# Patient Record
Sex: Female | Born: 1945
Health system: Southern US, Community
[De-identification: ages and names within clinical notes are randomized; demographics above are authoritative.]

## PROBLEM LIST (undated history)

## (undated) ENCOUNTER — Emergency Department (HOSPITAL_BASED_OUTPATIENT_CLINIC_OR_DEPARTMENT_OTHER): Admission: EM | Payer: BLUE CROSS/BLUE SHIELD | Source: Home / Self Care

## (undated) DIAGNOSIS — M810 Age-related osteoporosis without current pathological fracture: Secondary | ICD-10-CM

## (undated) DIAGNOSIS — K754 Autoimmune hepatitis: Secondary | ICD-10-CM

## (undated) DIAGNOSIS — K219 Gastro-esophageal reflux disease without esophagitis: Secondary | ICD-10-CM

## (undated) DIAGNOSIS — R002 Palpitations: Secondary | ICD-10-CM

## (undated) DIAGNOSIS — K589 Irritable bowel syndrome without diarrhea: Secondary | ICD-10-CM

## (undated) DIAGNOSIS — C801 Malignant (primary) neoplasm, unspecified: Secondary | ICD-10-CM

## (undated) DIAGNOSIS — M069 Rheumatoid arthritis, unspecified: Secondary | ICD-10-CM

## (undated) DIAGNOSIS — R5382 Chronic fatigue, unspecified: Secondary | ICD-10-CM

## (undated) DIAGNOSIS — M351 Other overlap syndromes: Secondary | ICD-10-CM

## (undated) DIAGNOSIS — Z87442 Personal history of urinary calculi: Secondary | ICD-10-CM

## (undated) DIAGNOSIS — E349 Endocrine disorder, unspecified: Secondary | ICD-10-CM

## (undated) DIAGNOSIS — M329 Systemic lupus erythematosus, unspecified: Secondary | ICD-10-CM

## (undated) DIAGNOSIS — IMO0002 Reserved for concepts with insufficient information to code with codable children: Secondary | ICD-10-CM

## (undated) DIAGNOSIS — E039 Hypothyroidism, unspecified: Secondary | ICD-10-CM

## (undated) DIAGNOSIS — T8859XA Other complications of anesthesia, initial encounter: Secondary | ICD-10-CM

## (undated) DIAGNOSIS — T4145XA Adverse effect of unspecified anesthetic, initial encounter: Secondary | ICD-10-CM

## (undated) HISTORY — PX: LAPAROSCOPIC CHOLECYSTECTOMY: SUR755

## (undated) HISTORY — DX: Endocrine disorder, unspecified: E34.9

## (undated) HISTORY — PX: TUBAL LIGATION: SHX77

## (undated) HISTORY — PX: ABDOMINAL SURGERY: SHX537

## (undated) HISTORY — DX: Rheumatoid arthritis, unspecified: M06.9

## (undated) HISTORY — PX: TONSILLECTOMY AND ADENOIDECTOMY: SHX28

## (undated) HISTORY — PX: ABDOMINAL HYSTERECTOMY: SHX81

## (undated) HISTORY — DX: Irritable bowel syndrome, unspecified: K58.9

## (undated) HISTORY — DX: Malignant (primary) neoplasm, unspecified: C80.1

## (undated) HISTORY — DX: Chronic fatigue, unspecified: R53.82

## (undated) HISTORY — DX: Age-related osteoporosis without current pathological fracture: M81.0

## (undated) HISTORY — DX: Reserved for concepts with insufficient information to code with codable children: IMO0002

## (undated) HISTORY — PX: BLADDER SUSPENSION: SHX72

## (undated) HISTORY — DX: Systemic lupus erythematosus, unspecified: M32.9

## (undated) HISTORY — PX: EYE SURGERY: SHX253

## (undated) HISTORY — PX: TONSILLECTOMY: SUR1361

## (undated) HISTORY — DX: Hypothyroidism, unspecified: E03.9

## (undated) HISTORY — DX: Palpitations: R00.2

---

## 1978-05-26 HISTORY — PX: BREAST SURGERY: SHX581

## 1998-02-21 ENCOUNTER — Emergency Department (HOSPITAL_COMMUNITY): Admission: EM | Admit: 1998-02-21 | Discharge: 1998-02-21 | Payer: Self-pay | Admitting: Emergency Medicine

## 1998-06-15 ENCOUNTER — Ambulatory Visit: Admission: RE | Admit: 1998-06-15 | Discharge: 1998-06-15 | Payer: Self-pay | Admitting: Internal Medicine

## 1998-07-03 ENCOUNTER — Other Ambulatory Visit: Admission: RE | Admit: 1998-07-03 | Discharge: 1998-07-03 | Payer: Self-pay | Admitting: Obstetrics and Gynecology

## 1999-07-22 ENCOUNTER — Encounter: Payer: Self-pay | Admitting: Obstetrics and Gynecology

## 1999-07-22 ENCOUNTER — Encounter: Admission: RE | Admit: 1999-07-22 | Discharge: 1999-07-22 | Payer: Self-pay | Admitting: Obstetrics and Gynecology

## 1999-10-30 ENCOUNTER — Other Ambulatory Visit: Admission: RE | Admit: 1999-10-30 | Discharge: 1999-10-30 | Payer: Self-pay | Admitting: Obstetrics and Gynecology

## 1999-10-31 ENCOUNTER — Other Ambulatory Visit: Admission: RE | Admit: 1999-10-31 | Discharge: 1999-10-31 | Payer: Self-pay | Admitting: Obstetrics and Gynecology

## 1999-10-31 ENCOUNTER — Encounter (INDEPENDENT_AMBULATORY_CARE_PROVIDER_SITE_OTHER): Payer: Self-pay

## 2000-08-12 ENCOUNTER — Encounter: Admission: RE | Admit: 2000-08-12 | Discharge: 2000-08-12 | Payer: Self-pay | Admitting: Obstetrics and Gynecology

## 2000-08-12 ENCOUNTER — Encounter: Payer: Self-pay | Admitting: Obstetrics and Gynecology

## 2000-11-04 ENCOUNTER — Other Ambulatory Visit: Admission: RE | Admit: 2000-11-04 | Discharge: 2000-11-04 | Payer: Self-pay | Admitting: Obstetrics and Gynecology

## 2001-08-17 ENCOUNTER — Encounter: Payer: Self-pay | Admitting: Obstetrics and Gynecology

## 2001-08-17 ENCOUNTER — Encounter: Admission: RE | Admit: 2001-08-17 | Discharge: 2001-08-17 | Payer: Self-pay | Admitting: Obstetrics and Gynecology

## 2001-08-25 ENCOUNTER — Other Ambulatory Visit: Admission: RE | Admit: 2001-08-25 | Discharge: 2001-08-25 | Payer: Self-pay | Admitting: Obstetrics and Gynecology

## 2002-10-03 ENCOUNTER — Encounter: Admission: RE | Admit: 2002-10-03 | Discharge: 2002-10-03 | Payer: Self-pay | Admitting: Obstetrics and Gynecology

## 2002-10-03 ENCOUNTER — Encounter: Payer: Self-pay | Admitting: Obstetrics and Gynecology

## 2002-11-22 ENCOUNTER — Other Ambulatory Visit: Admission: RE | Admit: 2002-11-22 | Discharge: 2002-11-22 | Payer: Self-pay | Admitting: Obstetrics and Gynecology

## 2003-10-18 ENCOUNTER — Encounter: Admission: RE | Admit: 2003-10-18 | Discharge: 2003-10-18 | Payer: Self-pay | Admitting: Obstetrics and Gynecology

## 2003-12-11 ENCOUNTER — Other Ambulatory Visit: Admission: RE | Admit: 2003-12-11 | Discharge: 2003-12-11 | Payer: Self-pay | Admitting: Obstetrics and Gynecology

## 2005-01-28 ENCOUNTER — Encounter: Admission: RE | Admit: 2005-01-28 | Discharge: 2005-01-28 | Payer: Self-pay | Admitting: Obstetrics and Gynecology

## 2005-02-13 ENCOUNTER — Other Ambulatory Visit: Admission: RE | Admit: 2005-02-13 | Discharge: 2005-02-13 | Payer: Self-pay | Admitting: Obstetrics and Gynecology

## 2005-09-24 ENCOUNTER — Encounter: Payer: Self-pay | Admitting: Emergency Medicine

## 2005-10-27 ENCOUNTER — Encounter: Admission: RE | Admit: 2005-10-27 | Discharge: 2005-10-27 | Payer: Self-pay | Admitting: Family Medicine

## 2006-05-28 ENCOUNTER — Encounter: Admission: RE | Admit: 2006-05-28 | Discharge: 2006-05-28 | Payer: Self-pay | Admitting: Obstetrics and Gynecology

## 2006-09-02 ENCOUNTER — Encounter: Payer: Self-pay | Admitting: Internal Medicine

## 2006-09-04 ENCOUNTER — Encounter: Payer: Self-pay | Admitting: Internal Medicine

## 2007-08-13 ENCOUNTER — Encounter: Payer: Self-pay | Admitting: Internal Medicine

## 2007-08-24 ENCOUNTER — Ambulatory Visit: Payer: Self-pay | Admitting: Internal Medicine

## 2007-08-24 DIAGNOSIS — R0602 Shortness of breath: Secondary | ICD-10-CM | POA: Insufficient documentation

## 2007-08-24 DIAGNOSIS — J984 Other disorders of lung: Secondary | ICD-10-CM

## 2007-09-07 ENCOUNTER — Telehealth (INDEPENDENT_AMBULATORY_CARE_PROVIDER_SITE_OTHER): Payer: Self-pay | Admitting: *Deleted

## 2007-09-08 ENCOUNTER — Encounter: Payer: Self-pay | Admitting: Internal Medicine

## 2007-09-15 ENCOUNTER — Telehealth: Payer: Self-pay | Admitting: Internal Medicine

## 2008-04-13 ENCOUNTER — Ambulatory Visit: Payer: Self-pay | Admitting: Internal Medicine

## 2008-04-13 ENCOUNTER — Telehealth: Payer: Self-pay | Admitting: Internal Medicine

## 2008-04-28 ENCOUNTER — Encounter (INDEPENDENT_AMBULATORY_CARE_PROVIDER_SITE_OTHER): Payer: Self-pay | Admitting: Obstetrics and Gynecology

## 2008-04-28 ENCOUNTER — Ambulatory Visit (HOSPITAL_COMMUNITY): Admission: RE | Admit: 2008-04-28 | Discharge: 2008-04-28 | Payer: Self-pay | Admitting: Obstetrics and Gynecology

## 2008-04-30 ENCOUNTER — Observation Stay (HOSPITAL_COMMUNITY): Admission: AD | Admit: 2008-04-30 | Discharge: 2008-05-01 | Payer: Self-pay | Admitting: Obstetrics and Gynecology

## 2008-08-18 ENCOUNTER — Emergency Department (HOSPITAL_COMMUNITY): Admission: EM | Admit: 2008-08-18 | Discharge: 2008-08-18 | Payer: Self-pay | Admitting: Emergency Medicine

## 2008-11-06 ENCOUNTER — Encounter: Admission: RE | Admit: 2008-11-06 | Discharge: 2008-11-06 | Payer: Self-pay | Admitting: Obstetrics and Gynecology

## 2009-04-17 ENCOUNTER — Telehealth (INDEPENDENT_AMBULATORY_CARE_PROVIDER_SITE_OTHER): Payer: Self-pay | Admitting: *Deleted

## 2009-04-20 ENCOUNTER — Telehealth (INDEPENDENT_AMBULATORY_CARE_PROVIDER_SITE_OTHER): Payer: Self-pay | Admitting: *Deleted

## 2009-04-25 ENCOUNTER — Telehealth (INDEPENDENT_AMBULATORY_CARE_PROVIDER_SITE_OTHER): Payer: Self-pay | Admitting: *Deleted

## 2009-04-26 ENCOUNTER — Emergency Department (HOSPITAL_COMMUNITY): Admission: EM | Admit: 2009-04-26 | Discharge: 2009-04-27 | Payer: Self-pay | Admitting: Emergency Medicine

## 2009-04-26 ENCOUNTER — Telehealth: Payer: Self-pay | Admitting: Pulmonary Disease

## 2009-04-27 ENCOUNTER — Ambulatory Visit: Payer: Self-pay | Admitting: Internal Medicine

## 2009-04-27 DIAGNOSIS — M359 Systemic involvement of connective tissue, unspecified: Secondary | ICD-10-CM

## 2009-04-27 DIAGNOSIS — J189 Pneumonia, unspecified organism: Secondary | ICD-10-CM | POA: Insufficient documentation

## 2009-04-30 ENCOUNTER — Telehealth: Payer: Self-pay | Admitting: Internal Medicine

## 2009-05-03 ENCOUNTER — Telehealth (INDEPENDENT_AMBULATORY_CARE_PROVIDER_SITE_OTHER): Payer: Self-pay | Admitting: *Deleted

## 2009-05-11 ENCOUNTER — Telehealth: Payer: Self-pay | Admitting: Internal Medicine

## 2010-01-22 ENCOUNTER — Encounter: Admission: RE | Admit: 2010-01-22 | Discharge: 2010-01-22 | Payer: Self-pay | Admitting: Obstetrics and Gynecology

## 2010-06-16 ENCOUNTER — Encounter: Payer: Self-pay | Admitting: Obstetrics and Gynecology

## 2010-08-27 LAB — BASIC METABOLIC PANEL
GFR calc Af Amer: >60 mL/min (ref >60)
GFR calc non Af Amer: >60 mL/min (ref >60)
Glucose, Bld: 129 mg/dL — ABNORMAL HIGH (ref 70–99)
Potassium: 4 mEq/L (ref 3.5–5.1)
Sodium: 133 mEq/L — ABNORMAL LOW (ref 135–145)

## 2010-08-27 LAB — URINE CULTURE: Colony Count: >=100000

## 2010-08-27 LAB — DIFFERENTIAL
Basophils Relative: 1 % (ref 0–1)
Eosinophils Relative: 0 % (ref 0–5)
Monocytes Absolute: 0.9 10*3/uL (ref 0.1–1.0)

## 2010-08-27 LAB — CBC
HCT: 35.7 % — ABNORMAL LOW (ref 36.0–46.0)
Hemoglobin: 12.2 g/dL (ref 12.0–15.0)
MCHC: 34.1 g/dL (ref 30.0–36.0)
MCV: 95.5 fL (ref 78.0–100.0)
Platelets: 403 10*3/uL — ABNORMAL HIGH (ref 150–400)
RBC: 3.74 MIL/uL — ABNORMAL LOW (ref 3.87–5.11)
RDW: 13.6 % (ref 11.5–15.5)

## 2010-08-27 LAB — URINE MICROSCOPIC-ADD ON

## 2010-08-27 LAB — URINALYSIS, ROUTINE W REFLEX MICROSCOPIC
Hgb urine dipstick: NEGATIVE
Nitrite: NEGATIVE
Protein, ur: NEGATIVE mg/dL
pH: 7.5 (ref 5.0–8.0)

## 2010-09-05 LAB — COMPREHENSIVE METABOLIC PANEL
ALT: 52 U/L — ABNORMAL HIGH (ref 0–35)
AST: 49 U/L — ABNORMAL HIGH (ref 0–37)
Alkaline Phosphatase: 82 U/L (ref 39–117)
BUN: 14 mg/dL (ref 6–23)
CO2: 29 mEq/L (ref 19–32)
Calcium: 9.7 mg/dL (ref 8.4–10.5)
GFR calc Af Amer: >60 mL/min (ref >60)

## 2010-09-05 LAB — CBC
HCT: 40.8 % (ref 36.0–46.0)
Hemoglobin: 14.5 g/dL (ref 12.0–15.0)
MCHC: 35.4 g/dL (ref 30.0–36.0)
Platelets: 222 10*3/uL (ref 150–400)
RBC: 4.26 MIL/uL (ref 3.87–5.11)

## 2010-09-05 LAB — DIFFERENTIAL
Basophils Relative: 1 % (ref 0–1)
Eosinophils Absolute: 0.2 10*3/uL (ref 0.0–0.7)
Eosinophils Relative: 2 % (ref 0–5)
Lymphs Abs: 3.1 10*3/uL (ref 0.7–4.0)
Monocytes Relative: 9 % (ref 3–12)
Neutro Abs: 2.9 10*3/uL (ref 1.7–7.7)
Neutrophils Relative %: 42 % — ABNORMAL LOW (ref 43–77)

## 2010-09-05 LAB — LIPID PANEL
Cholesterol: 209 mg/dL — ABNORMAL HIGH (ref 0–200)
HDL: 58 mg/dL (ref >39)
LDL Cholesterol: 134 mg/dL — ABNORMAL HIGH (ref 0–99)
Triglycerides: 87 mg/dL (ref <150)

## 2010-09-05 LAB — POCT CARDIAC MARKERS
Myoglobin, poc: 51.7 ng/mL (ref 12–200)
Troponin i, poc: <0.05 ng/mL (ref 0.00–0.09)

## 2010-09-07 ENCOUNTER — Emergency Department (HOSPITAL_COMMUNITY)
Admission: EM | Admit: 2010-09-07 | Discharge: 2010-09-08 | Disposition: A | Discharge status: Home / Self Care | Payer: BC Managed Care – HMO | Attending: Emergency Medicine | Admitting: Emergency Medicine

## 2010-09-07 DIAGNOSIS — M329 Systemic lupus erythematosus, unspecified: Secondary | ICD-10-CM | POA: Insufficient documentation

## 2010-09-07 DIAGNOSIS — I519 Heart disease, unspecified: Secondary | ICD-10-CM | POA: Insufficient documentation

## 2010-09-07 DIAGNOSIS — J45909 Unspecified asthma, uncomplicated: Secondary | ICD-10-CM | POA: Insufficient documentation

## 2010-09-07 DIAGNOSIS — R197 Diarrhea, unspecified: Secondary | ICD-10-CM | POA: Insufficient documentation

## 2010-09-07 DIAGNOSIS — K5289 Other specified noninfective gastroenteritis and colitis: Secondary | ICD-10-CM | POA: Insufficient documentation

## 2010-09-07 DIAGNOSIS — E039 Hypothyroidism, unspecified: Secondary | ICD-10-CM | POA: Insufficient documentation

## 2010-09-07 DIAGNOSIS — R1110 Vomiting, unspecified: Secondary | ICD-10-CM | POA: Insufficient documentation

## 2010-09-07 DIAGNOSIS — R509 Fever, unspecified: Secondary | ICD-10-CM | POA: Insufficient documentation

## 2010-09-08 LAB — BASIC METABOLIC PANEL
BUN: 24 mg/dL — ABNORMAL HIGH (ref 6–23)
Chloride: 106 mEq/L (ref 96–112)
Creatinine, Ser: 0.78 mg/dL (ref 0.4–1.2)

## 2010-09-08 LAB — CBC
MCH: 32 pg (ref 26.0–34.0)
MCHC: 34.8 g/dL (ref 30.0–36.0)
MCV: 91.7 fL (ref 78.0–100.0)
Platelets: 174 10*3/uL (ref 150–400)
RBC: 5.07 MIL/uL (ref 3.87–5.11)

## 2010-09-08 LAB — DIFFERENTIAL
Eosinophils Absolute: 0 10*3/uL (ref 0.0–0.7)
Eosinophils Relative: 0 % (ref 0–5)
Lymphs Abs: 0.2 10*3/uL — ABNORMAL LOW (ref 0.7–4.0)
Monocytes Absolute: 0.4 10*3/uL (ref 0.1–1.0)
Monocytes Relative: 5 % (ref 3–12)

## 2010-09-08 LAB — LIPASE, BLOOD: Lipase: 23 U/L (ref 11–59)

## 2010-10-08 NOTE — H&P (Signed)
NAME:  Lori [name], Lori [name] NO.:  [id number]   MEDICAL RECORD NO.:  [id number]          PATIENT TYPE:  AMB   LOCATION:                                FACILITY:  [facility]   PHYSICIAN:  Crist Fat. Rivard, M.D. DATE OF BIRTH:  1945/11/14   DATE OF ADMISSION:  06/11/2007  DATE OF DISCHARGE:                              HISTORY & PHYSICAL   HISTORY OF PRESENT ILLNESS:  Lori [name] is a 65 year old married white  female, para 3-0-1-3, who presents for laparoscopic bilateral salpingo-  oophorectomy because of chronic left lower quadrant pelvic pain and  pelvic congestion syndrome.  For over a year, patient has reported left  lower quadrant pelvic pain which radiates to her back, and she rates  that pain as an 8/10 to 9/10 on a 10-point pain scale.  The patient  states that the pain is intermittent and will last for hours at a time  with no alleviating or exacerbating factors.  Often, ,patient will  experience pain upon awakening and finds relief only from narcotic  analgesia.  In April 2008, patient had a normal pelvic ultrasound as  well as pelvic CT, thought the CT scan did reveal enlarged uterine  veins.  The patient underwent a gastroenterology workup in the spring of  2008, which also revealed no acute disease, though diverticulosis was  observed.  The patient denies any changes in her bowel habits, vaginitis  symptoms, urinary tract symptoms or fever.  A followup pelvic ultrasound  in December 2008 revealed bilateral adnexal varicosities, otherwise it  was within normal limits.  Given the debilitating and protracted nature  of her symptoms, the patient has decided to proceed with bilateral  salpingo-oophorectomy as a means of managing her pelvic pain.   PAST MEDICAL HISTORY/OB HISTORY:  gravida 4, para 3-0-1-3.   GYN HISTORY:  Menarche 65 years old.  Patient is menopausal since  November 1999.  She has undergone a bilateral tubal ligation.  She  denies any history of sexually  transmitted diseases or abnormal Pap  smears.  The patient's last normal Pap smear and mammogram were in  January 2008.   MEDICAL HISTORY:  Positive for:  1. Asthma.  2. Thyroid disease.  3. Arthritis.  4. Behcet syndrome.  5. Irritable bowel syndrome.  6. Lupus.  7. Glaucoma.  8. Diverticulosis.  9. Elevated liver function tests.   SURGICAL HISTORY:  1. Tonsillectomy.  2. Bilateral tubal ligation.  3. D&C.  4. Cholecystectomy.  5. Left breast lumpectomy (1973 and 1994).  6. Abdominoplasty.  Patient reports postoperative bleeding on 2      separate occasions.  She denies any problems with anesthesia.   FAMILY HISTORY:  Colon cancer (father), breast cancer (postmenopausal),  lupus, diabetes mellitus, cardiovascular disease, asthma and thyroid  disease.   HABITS:  Patient does not use tobacco and rarely consumes alcohol.   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   CURRENT MEDICATIONS:  1. Biest cream (80:20) 0.5 mg daily.  2. Progesterone capsules 100 mg once at bedtime daily.  3. Potassium  chloride 20 mEq daily.  4. Dexedrine 5 mg daily as needed.  5. Lorazepam 1 mg daily as needed.  6. Ashby Dawes Thyroid 90 mg twice daily.  7. Darvocet-N 100 one tablet every 4 hours as needed.  8. Amino acids 100 mg daily.  9. Glucosamine 1 tablet daily.  10.Bone-Up daily.   ALLERGIES:  Patient is allergic to [facility] which causes a rash.  She is  sensitive to ERYTHROMYCIN, CODEINE and SYNTHETIC ESTROGEN, all of which  cause severe nausea.   REVIEW OF SYSTEMS:  The patient denies any chest pain, shortness of  breath, headache, vision changes and, except as is mentioned in history  of present illness, patient's review of systems is negative.   PHYSICAL EXAM:  Blood pressure is 108/70.  Weight is 135.  Height is 5  feet 7 inches tall.  GENERAL EXAM:  Neck is supple without masses.  There is no thyromegaly  or cervical adenopathy.  HEART:  Regular rate and  rhythm.  LUNGS:  Clear.  BACK:  No CVA tenderness.  ABDOMEN:  No tenderness, masses or organomegaly.  EXTREMITIES:  No clubbing, cyanosis or edema.  PELVIC EXAM:  EG/BUS is normal.  Vagina is normal.  Cervix is nontender  without lesions.  Uterus normal size, shape and consistency without  tenderness.  Adnexa without tenderness or masses.  Rectovaginal without  masses.   IMPRESSION:  1. Left lower quadrant chronic pelvic pain.  2. Pelvic congestion syndrome.   DISPOSITION:  A discussion was held with patient regarding the  indications along with the risks which include but are not limited to  reaction to anesthesia, damage to adjacent organs, infection, excessive  bleeding and the possibility  that her pain may not be alleviated by this surgery.  The patient  verbalized understanding of these risks, and has consented to proceed  with laparoscopic bilateral salpingo-oophorectomy at [facility]  [place] on Friday, June 11, 2007.      [place] J. Adline Peals.      Crist Fat Rivard, M.D.  Electronically Signed    EJP/MEDQ  D:  06/08/2007  T:  06/08/2007  Job:  [id number]

## 2010-10-08 NOTE — H&P (Signed)
NAME:  Lori [name], BABINSKI NO.:  [id number]   MEDICAL RECORD NO.:  [id number]          PATIENT TYPE:  AMB   LOCATION:  SDC                           [facility]ACILITY:  [facility]   PHYSICIAN:  Crist Fat. Rivard, M.D. DATE OF BIRTH:  Sep 30, 1945   DATE OF ADMISSION:  DATE OF DISCHARGE:                              HISTORY & PHYSICAL   HISTORY OF PRESENT ILLNESS:  Lori [name] is a 65 year old married white  female, para 3-0-1-3, who presents for a laparoscopic bilateral salpingo-  oophorectomy because of chronic pelvic pain primarily in her left lower  quadrant and for pelvic congestion syndrome.  For over a year, the  patient has reported left lower quadrant pelvic pain, which radiates to  her back, and she rates that pain as an 8/10 to 9/10 on a 10-point pain  scale.  The patient states that the pain is intermittent and will last  for hours at that time with no alleviating or exacerbating factors.  The  patient will experience this discomfort to some degree daily.  Often,  the patient will experience pain upon awakening and finds relief only  from narcotic analgesia.  In April 2008, the patient had a normal pelvic  ultrasound as well as a pelvic CT scan, though the CT scan did reveal  enlarged uterine veins.  The patient underwent a gastroenterology workup  in the spring of 2008, which also revealed no acute disease, though  diverticulosis was observed.  The patient denies any changes in her  bowel or bladder habits, vaginitis symptoms, or fever.  She had a  followup ultrasound in December 2008, which revealed bilateral adnexal  varicosities; however, it was within normal limits.  The patient's pain  became more bearable with time, though persistent until October 2009,  when she reported marked increase in her pain that still was greater on  her left compared with her right lower quadrants.  A pelvic ultrasound  at that time revealed a uterus measuring 6.95 x 3.20 x 4.39 cm with her  right ovary measuring 1.98 x 1.08 x 1.79 cm, left ovary measuring 1.87 x  1.03 x 2.08 cm.  The patient was observed to have dilated vessels as  previously seen on her January ultrasound measuring in the right adnexa  0.81 and 0.70 cm and the left adnexa 0.80 and 0.51 cm.  Given the  debilitating and protracted nature of her symptoms, the patient wishes  to proceed with bilateral salpingo-oophorectomy as a means of managing  her pelvic pain, though she recognizes that this procedure may not  totally alleviate her discomfort.   PAST MEDICAL HISTORY:  OB History:  Gravida 4, para 3-0-1-3.   GYN History:  Menarche 65 years old.  The patient has been menopausal  since November 1999.  She has undergone a bilateral tubal ligation.  She  denies any history of sexually transmitted diseases or abnormal Pap  smears.  The patient had a normal Pap smear in January 2009.   Medical History:  Positive for asthma; thyroid disease; arthritis;  Behcet syndrome; irritable bowel syndrome; lupus; glaucoma;  diverticulosis;  elevated liver function tests; previous head, neck, and  back injury.   SURGICAL HISTORY:  The patient had a tonsillectomy, bilateral tubal  ligation, D&C, cholecystectomy, left breast lumpectomy (1973 and 1994),  abdominoplasty at which time the patient reported postoperative bleeding  on 2 separate occasions; however, she denies any problems with  anesthesia.  The patient also has had cosmetic eye surgery.   FAMILY HISTORY:  Colon cancer (father), breast cancer (postmenopausal),  lupus, diabetes mellitus, cardiovascular disease, asthma, and thyroid  disease.   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   HABITS:  The patient does not use any tobacco and rarely consumes  alcohol.   CURRENT MEDICATIONS:  1. Lorazepam 1 mg daily as needed.  2. Biest Cream (80:20) 0.5 mg daily.  3. Progesterone capsules 100 mg at bedtime daily.  4. Potassium chloride 20  mEq daily.  5. Dexedrine 5 mg daily as needed.  6. Ashby Dawes thyroid 90 mg twice daily.  7. Valerian 1 tablet daily.  8. Advil as needed.  9. Aspirin 81 mg daily.  10.Glucosamine as needed.  11.ADR (for inflammation) 4 tablets daily.  12.Vitali-Zyme 5-10 tablets daily.  13.Multivitamin daily.  14.DHEA daily.  15.Colloidal silver daily.  16.Moduretic daily.   The patient also reports taking a medicine to regulate her heart beat as  she does have palpitations; however, she does not know the name of this  medication.   ALLERGIES:  SYNTHETIC ESTROGEN, which causes her severe nausea.  She is  allergic to [facility], which causes a rash.  She is sensitive to  ERYTHROMYCIN and CODEINE, both of which also cause nausea.   REVIEW OF SYSTEMS:  The patient has nausea, diarrhea, urinary hesitancy,  occasional stress urinary incontinence symptoms, palpitations, but  denies any chest pain, shortness of breath, headache, vision changes,  and except as is mentioned in history of present illness, the patient's  review of systems is negative.   PHYSICAL EXAMINATION:  VITAL SIGNS:  Blood pressure is 104/64, pulse is  64, respirations 18, temperature 94.0 degrees Fahrenheit orally, weight  is 145, height is 5 feet 7 inches, body mass index 22.7.  NECK:  Supple without masses.  There is no thyromegaly or cervical  adenopathy.  HEART:  Regular rate and rhythm.  There is no murmur.  LUNGS:  Clear.  BACK:  No CVA tenderness.  ABDOMEN:  Diffusely tender without guarding.  No rebound, masses, or  organomegaly.  EXTREMITIES:  Without clubbing, cyanosis, or edema.  PELVIC:  EGBUS is normal.  Vagina is normal.  Cervix is nontender  without lesions.  Uterus appears normal size, shape, and consistency  without tenderness.  Adnexa, mildly tender bilaterally.   IMPRESSION:  Chronic pelvic pain (left lower quadrant), pelvic  congestion syndrome.   DISPOSITION:  A discussion was held with the patient regarding  the  indications for her procedure along with its risks, which include but  are not limited to reaction to anesthesia, damage to adjacent organs,  infections, excessive  bleeding, and the possibility that her pain may not be alleviated by the  surgery.  The patient verbalized understanding of these risks and has  consented to proceed with a laparoscopic bilateral salpingo-oophorectomy  at [facility] on April 28, 2008, at 7:30 a.m.      [place]mira J. Adline Peals.      Crist Fat Rivard, M.D.  Electronically Signed    EJP/MEDQ  D:  04/26/2008  T:  04/26/2008  Job:  [id number]

## 2010-10-08 NOTE — Op Note (Signed)
NAME:  Lori [name], [name] NO.:  [id number]   MEDICAL RECORD NO.:  [id number]          PATIENT TYPE:  AMB   LOCATION:  SDC                           [facility]ACILITY:  [facility]   PHYSICIAN:  Crist Fat. Rivard, M.D. DATE OF BIRTH:  [birth year]   DATE OF PROCEDURE:  DATE OF DISCHARGE:                               OPERATIVE REPORT   PREOPERATIVE DIAGNOSIS:  Chronic pelvic pain with pelvic congestion  syndrome.   POSTOPERATIVE DIAGNOSIS:  Chronic pelvic pain with pelvic congestion  syndrome with pelvic adhesions.   ANESTHESIA:  General, Dr. Tacy Dura.   PROCEDURE:  Laparoscopy with lysis of adhesions and bilateral salpingo-  oophorectomy.   SURGEON:  Crist Fat. Rivard, MD   ASSISTANT:  Osborn Coho, MD.   PROCEDURE:  After being informed of the planned procedure with possible  complications including bleeding, infection, injury to bowel, bladder or  ureters, need for laparotomy, and the possibility that the pain will not  completely be alleviated by the procedure.  Informed consent was  obtained.  The patient was taken to OR #7, given general anesthesia with  endotracheal intubation without any complication.  She was prepped and  draped in a sterile fashion and a Foley catheter is inserted in her  bladder.  Pelvic exam reveals an anteverted uterus and 2 small adnexa.  A speculum was inserted, anterior lip of the cervix was grasped with a  tenaculum forceps, and an acorn manipulator is placed in the uterus.  The speculum is then removed.   The umbilical area is infiltrated with 5 mL of Marcaine 0.25 and we  performed a semi elliptical incision which is brought down sharply to  the fascia.  The fascia is identified, grasped with Kocher forceps, and  incised with Mayo scissors.  The peritoneum was entered bluntly.  A  pursestring suture of 0 Vicryl was placed on the fascia and a 10-mm  Hassan trocar is easily inserted, held in place with a pursestring  suture.  This allows for  easy insufflation of the pneumoperitoneum with  CO2 at a maximum pressure of 15 mmHg.  A laparoscope was inserted.  A 10-  mm suprapubic trocar is placed under direct visualization after  infiltrating with Marcaine 0.25 and a left lower quadrant 5-mm trocar is  placed under direct visualization after infiltrating with Marcaine 0.25.   OBSERVATION:  Anterior cul-de-sac is normal.  Posterior cul-de-sac is  normal.  The uterus is normal.  On the right side the right tube shows  signs of previous tubal ligation, but otherwise is normal.  The right  ovary is small, postmenopausal appearance, and has a small adhesion  retracting it to the pelvic wall.  Otherwise it is unremarkable.  The IP  ligament seen as appropriate in size and varicosities are not evident in  this position.  The ureter is easily identified away from the IP  infundibulopelvic ligament.  On the left side, the left tube also shows  sign of previous tubal ligation.  The left fimbriae is completely  adherent to the left pelvic wall covering the ovary which under need  appears normal postmenopausal.  The infundibulopelvic ligament is  significantly enlarged with large varicosities.  The ureter is  identified closer, but still is easily seen away from the  infundibulopelvic ligament.  There is also a little bit adhesion between  the sigmoid colon and the pelvic wall which will need to be addressed in  order to have a better visualization of the infundibulopelvic ligament  during its resection.  The appendix is not visualized the liver is  visualized and normal.  We do see few adhesions between the omentum and  the right upper quadrant.   Using Gyrus bipolar forceps, we proceeded with the right side initially  and started with sectioning the fine adhesions between the ovary and the  right pelvic wall.  This completely frees the ovary which can be flipped  above and the IP ligament can be easily grasped away from the ureter and   cauterized and sectioned.  The utero-ovarian ligament can be grasped,  cauterized, and sectioned.  The tube can be grasped and sectioned and  then we continue our course through the mesosalpinx with the tripolar  Gyrus energy cauterizing and sectioning which completely frees the right  adnexa with the tube.  This specimen is deposited in the posterior cul-  de-sac.  Hemostasis is adequate and again the ureter is easily  visualized with good peristaltism away from our site of resection.  We  then moved to the left side and using monopolar scissors, we addressed  the adhesion between the sigmoid colon and the pelvic wall so that we  freed them enough to be able to isolate the infundibulopelvic ligament  and visualize the ureter.  Because of the previously described adhesion  between the fimbrial end of the tube and the pelvic wall retracting the  ovary, we need to address these adhesions to be able to free the adnexa  completely.  Using traction, countertraction with bipolar energy and  scissors, we are able to free this.  Now, this gives [place] a perfect view  of the left pelvic wall, which shows the ureter away from the  infundibulopelvic ligament with nice peristaltism.  The  infundibulopelvic ligament is then grasped with the gyrus forceps,  cauterized, and suction; the utero-ovarian ligament was grasped,  cauterized, and suctioned.  The tube was grasped, cauterized, and  suctioned at its insertion with the uterus and then the mesosalpinx was  grasped, cauterized, and suctioned freeing the left adnexa entirely.  This is deposited in the posterior cul-de-sac.  Hemostasis appears to be  adequate.  Through the 10-mm suprapubic trocar, an Endobag is inserted  and we can place both ovaries and both tubes in this bag and exteriorize  it removing the 10-mm trocar.  The fascial incision of the suprapubic  incision is extended slightly on both sides under direct visualization  and we are able to  remove the specimen entirely.  A 10-mm trocar was  reinserted and using a Nezhat irrigation suction system, we irrigate  profusely with warm saline and note a satisfactory hemostasis.  The  pneumoperitoneum is reduced to again confirm hemostasis.  Both ureters  are visualized, have good peristaltism, no dilatation.  Trocars are then  removed after evacuating the pneumoperitoneum.  The fascia of the  umbilical incision is closed with a previously placed pursestring suture  of 0 Vicryl after ensuring that no bowel loop is protruding and the  fascia of the suprapubic incision was closed with a figure-of-eight  stitch of 0 Vicryl.  The skin of all incision is then closed with  subcuticular suture of 3-0 Monocryl and Dermabond.   Instruments and sponge count is complete x2.  Estimated blood loss is  minimal.  The procedure is very well tolerated by the patient who is  taken to the recovery room in a well and stable condition.   SPECIMEN:  Right tube, right ovary, left tube, left ovary sent to  pathology.      Crist Fat Rivard, M.D.  Electronically Signed     SAR/MEDQ  D:  04/28/2008  T:  04/29/2008  Job:  [id number]

## 2010-10-11 NOTE — Discharge Summary (Signed)
NAME:  Lori [name], Lori [name] NO.:  [id number]   MEDICAL RECORD NO.:  [id number]          PATIENT TYPE:  OBV   LOCATION:  9305                          FACILITY:  [facility]   PHYSICIAN:  Osborn Coho, M.D.   DATE OF BIRTH:  Apr 04, 1946   DATE OF ADMISSION:  04/30/2008  DATE OF DISCHARGE:  05/01/2008                               DISCHARGE SUMMARY   DISCHARGE DIAGNOSES:  1. Chest pain and mildly elevated liver function tests.  2. Status post laparoscopic bilateral salpingo-oophorectomy (April 28, 2008).   HISTORY OF PRESENT ILLNESS:  Lori [name] is a 65 year old married white  female, para 3-0-1-3, who was status post laparoscopic bilateral  salpingo-oophorectomy with lysis of adhesions on April 28, 2008,  presenting to [facility] complaining of lower chest  pain which increased with breathing and involved her lower costal margin  as well as her upper abdominal area.  Please see the patient's chart for  complete medical history.   ADMISSION PHYSICAL EXAMINATION:  Blood pressure 108/55, temperature was  98.4 degrees Fahrenheit orally.  Lungs were clear bilaterally; however,  there was these decreased sounds at the bases.  Heart was irregular with  extra beat noted every 5-6 beats, but no murmur was appreciated.  The  patient's abdomen was slightly distended, mildly tender.  There was no  rebound or guarding, however.  The patient's incisions were noted to be  healed.  Pelvic exam reveals a scant vaginal discharge, but otherwise  unremarkable.   The patient's admission labs were within normal limits with the  urinalysis showing a specific gravity of 1.010, a trace of hemoglobin,  otherwise negative.  CBC with white blood count 8.9, platelets 177,  hemoglobin 12.8, hematocrit 36.1.  The patient's comprehensive metabolic  panel was within normal limits.  The patient's EKG showed a heart rate  of 76 with occasional premature atrial contractions,  otherwise there was  no ectopy and it was otherwise normal.   HOSPITAL COURSE:  On the date of admission, the patient underwent a CT  scan of her chest, abdomen, and pelvis.  The patient's chest CT did not  reveal any evidence of pulmonary embolism.  Abdominal CT showed no acute  findings.  Pelvic CT showed a small amount of free fluid with fluid  within the endometrium, along with scattered tiny air locules in the  subcutaneous fat in the lower anterior abdominal wall.  The patient was  given pain medication and cardiac enzymes were drawn, all of which  returned normal.  It was noted on the patient's followup labs, however,  that her CBC remained within normal limits.  However, her comprehensive  metabolic panel showed a decrease in protein to 5.4, albumin was 3.0,  and slight elevation of her ALT to 51, AFT to 49, and a total bilirubin  of 1.4.  By hospital day 2, the patient had significant improvement in  her pain to the use of Toradol and was therefore deemed to have received  the maximum benefit of her hospital stay.  The patient was discharged on  hospital day #2 with plans to follow up on her elevated liver function  tests as an outpatient.   DISCHARGE MEDICATIONS:  The patient was advised to follow instructions  on her home medication reconciliation form.  Additionally, she was given  Toradol 10 mg to take every 6 hours as needed for pain, but cautioned  not to take this up along with ibuprofen.   FOLLOWUP:  The patient is to keep her previous postoperative day visit  with Dr. Estanislado Pandy as scheduled.   DISCHARGE INSTRUCTIONS:  The patient was advised to call physician for  any increase in her pain, any temperature greater than 100.5 degrees  Fahrenheit, or any other concerns.  The patient's activity and diet were  without restriction.      Lori [name].      Osborn Coho, M.D.  Electronically Signed    EJP/MEDQ  D:  06/08/2008  T:  06/09/2008  Job:   [id number]

## 2011-01-29 HISTORY — PX: TRANSTHORACIC ECHOCARDIOGRAM: SHX275

## 2011-01-31 ENCOUNTER — Other Ambulatory Visit: Payer: Self-pay | Admitting: Obstetrics and Gynecology

## 2011-01-31 DIAGNOSIS — Z1231 Encounter for screening mammogram for malignant neoplasm of breast: Secondary | ICD-10-CM

## 2011-02-05 ENCOUNTER — Ambulatory Visit
Admission: RE | Admit: 2011-02-05 | Discharge: 2011-02-05 | Disposition: A | Discharge status: Home / Self Care | Payer: BC Managed Care – HMO | Source: Ambulatory Visit | Attending: Obstetrics and Gynecology | Admitting: Obstetrics and Gynecology

## 2011-02-05 DIAGNOSIS — Z1231 Encounter for screening mammogram for malignant neoplasm of breast: Secondary | ICD-10-CM

## 2011-02-28 LAB — DIFFERENTIAL
Basophils Absolute: 0 10*3/uL (ref 0.0–0.1)
Lymphocytes Relative: 21 % (ref 12–46)
Lymphs Abs: 1.9 10*3/uL (ref 0.7–4.0)
Monocytes Absolute: 0.9 10*3/uL (ref 0.1–1.0)
Neutro Abs: 6 10*3/uL (ref 1.7–7.7)

## 2011-02-28 LAB — COMPREHENSIVE METABOLIC PANEL
Albumin: 3.7 g/dL (ref 3.5–5.2)
Albumin: 4.2 g/dL (ref 3.5–5.2)
BUN: 15 mg/dL (ref 6–23)
CO2: 28 mEq/L (ref 19–32)
Creatinine, Ser: 0.67 mg/dL (ref 0.4–1.2)
GFR calc non Af Amer: >60 mL/min (ref >60)
Glucose, Bld: 102 mg/dL — ABNORMAL HIGH (ref 70–99)
Potassium: 3.5 mEq/L (ref 3.5–5.1)
Potassium: 4 mEq/L (ref 3.5–5.1)
Sodium: 141 mEq/L (ref 135–145)
Total Bilirubin: 0.7 mg/dL (ref 0.3–1.2)
Total Protein: 6.8 g/dL (ref 6.0–8.3)

## 2011-02-28 LAB — HEPATIC FUNCTION PANEL
ALT: 51 U/L — ABNORMAL HIGH (ref 0–35)
Albumin: 3 g/dL — ABNORMAL LOW (ref 3.5–5.2)
Alkaline Phosphatase: 70 U/L (ref 39–117)
Total Bilirubin: 1.4 mg/dL — ABNORMAL HIGH (ref 0.3–1.2)
Total Protein: 5.4 g/dL — ABNORMAL LOW (ref 6.0–8.3)

## 2011-02-28 LAB — URINE MICROSCOPIC-ADD ON

## 2011-02-28 LAB — CK TOTAL AND CKMB (NOT AT ARMC)
CK, MB: 1.7 ng/mL (ref 0.3–4.0)
Total CK: 78 U/L (ref 7–177)

## 2011-02-28 LAB — URINALYSIS, ROUTINE W REFLEX MICROSCOPIC
Glucose, UA: NEGATIVE mg/dL
Specific Gravity, Urine: 1.01 (ref 1.005–1.030)
pH: 6 (ref 5.0–8.0)

## 2011-02-28 LAB — CBC
HCT: 42.7 % (ref 36.0–46.0)
Hemoglobin: 12.8 g/dL (ref 12.0–15.0)
MCV: 96.3 fL (ref 78.0–100.0)
Platelets: 204 10*3/uL (ref 150–400)
RBC: 3.73 MIL/uL — ABNORMAL LOW (ref 3.87–5.11)
RDW: 13.1 % (ref 11.5–15.5)

## 2011-02-28 LAB — TROPONIN I: Troponin I: 0.06 ng/mL (ref 0.00–0.06)

## 2011-02-28 LAB — AMYLASE: Amylase: 42 U/L (ref 27–131)

## 2011-08-28 HISTORY — PX: OTHER SURGICAL HISTORY: SHX169

## 2011-08-28 HISTORY — PX: CARDIOVASCULAR STRESS TEST: SHX262

## 2011-09-08 ENCOUNTER — Telehealth: Payer: Self-pay | Admitting: Obstetrics and Gynecology

## 2011-09-08 NOTE — Telephone Encounter (Signed)
PC from pt stating that she has had more episodes of "allergic reactions" and is trying to figure out what exactly it is that she is allergic to.  She questioned what was used for dye during her 04-28-08 surgery.  Unable to find the answer to that question in op report.  I will leave chart for SR to review.  Told pt I would call when information was available.  Pt agreeable.  ld

## 2011-09-08 NOTE — Telephone Encounter (Signed)
Routed to laura

## 2011-09-09 ENCOUNTER — Telehealth: Payer: Self-pay

## 2011-09-09 NOTE — Telephone Encounter (Signed)
Pull chart

## 2011-09-15 NOTE — Telephone Encounter (Signed)
No msg °

## 2011-12-15 ENCOUNTER — Ambulatory Visit
Admission: RE | Admit: 2011-12-15 | Discharge: 2011-12-15 | Disposition: A | Discharge status: Home / Self Care | Payer: BC Managed Care – PPO | Source: Ambulatory Visit | Attending: Family Medicine | Admitting: Family Medicine

## 2011-12-15 ENCOUNTER — Other Ambulatory Visit: Payer: Self-pay | Admitting: Family Medicine

## 2011-12-15 DIAGNOSIS — M2580 Other specified joint disorders, unspecified joint: Secondary | ICD-10-CM

## 2011-12-17 ENCOUNTER — Other Ambulatory Visit: Payer: Self-pay | Admitting: Dermatology

## 2012-01-13 ENCOUNTER — Other Ambulatory Visit: Payer: Self-pay | Admitting: Obstetrics and Gynecology

## 2012-01-13 DIAGNOSIS — Z1231 Encounter for screening mammogram for malignant neoplasm of breast: Secondary | ICD-10-CM

## 2012-02-09 ENCOUNTER — Ambulatory Visit: Payer: BC Managed Care – PPO

## 2012-03-18 ENCOUNTER — Ambulatory Visit: Payer: Self-pay | Admitting: Obstetrics and Gynecology

## 2012-03-19 ENCOUNTER — Other Ambulatory Visit: Payer: Self-pay | Admitting: Gynecology

## 2012-03-19 ENCOUNTER — Other Ambulatory Visit: Payer: Self-pay | Admitting: *Deleted

## 2012-03-19 ENCOUNTER — Ambulatory Visit
Admission: RE | Admit: 2012-03-19 | Discharge: 2012-03-19 | Disposition: A | Discharge status: Home / Self Care | Payer: BC Managed Care – PPO | Source: Ambulatory Visit | Attending: Obstetrics and Gynecology | Admitting: Obstetrics and Gynecology

## 2012-03-19 DIAGNOSIS — Z1231 Encounter for screening mammogram for malignant neoplasm of breast: Secondary | ICD-10-CM

## 2012-03-19 DIAGNOSIS — N644 Mastodynia: Secondary | ICD-10-CM

## 2012-03-25 ENCOUNTER — Ambulatory Visit
Admission: RE | Admit: 2012-03-25 | Discharge: 2012-03-25 | Disposition: A | Discharge status: Home / Self Care | Payer: BC Managed Care – PPO | Source: Ambulatory Visit | Attending: Gynecology | Admitting: Gynecology

## 2012-03-25 DIAGNOSIS — N644 Mastodynia: Secondary | ICD-10-CM

## 2012-03-30 ENCOUNTER — Encounter: Payer: Self-pay | Admitting: Gynecology

## 2012-03-30 ENCOUNTER — Other Ambulatory Visit (HOSPITAL_COMMUNITY)
Admission: RE | Admit: 2012-03-30 | Discharge: 2012-03-30 | Disposition: A | Discharge status: Home / Self Care | Payer: BC Managed Care – PPO | Source: Ambulatory Visit | Attending: Gynecology | Admitting: Gynecology

## 2012-03-30 ENCOUNTER — Ambulatory Visit (INDEPENDENT_AMBULATORY_CARE_PROVIDER_SITE_OTHER): Payer: BC Managed Care – PPO | Admitting: Gynecology

## 2012-03-30 VITALS — BP 112/70 | Ht 66.5 in | Wt 148.0 lb

## 2012-03-30 DIAGNOSIS — Z780 Asymptomatic menopausal state: Secondary | ICD-10-CM

## 2012-03-30 DIAGNOSIS — Z1151 Encounter for screening for human papillomavirus (HPV): Secondary | ICD-10-CM | POA: Insufficient documentation

## 2012-03-30 DIAGNOSIS — Z01419 Encounter for gynecological examination (general) (routine) without abnormal findings: Secondary | ICD-10-CM

## 2012-03-30 NOTE — Progress Notes (Signed)
ZAIDEE RION 05/05/1946 [id number]   History:    66 y.o.  for annual gyn exam who is a new patient to the practice. She was seen another provider hearing [place]. Patient states in 2009 she had a laparoscopic bilateral salpingo-oophorectomy was benign. She's also seen her primary care physician a Dr. Martha Clan who has been doing her labs as well as her holistic medicine doctor Dr.Agustedes. They both had been drawing her labs and been treating her for her lupus as well as for her hypothyroidism. Patient declines flu vaccine. The only vaccine she has had has been a Pneumovax. She declines the herpes zoster vaccine. Her physician has her on estrogen cream 0.125 daily along with Prometrium 200 mg daily. Her last bone density study was over 2 years ago. There is question whether she had osteoporosis or not but is under no treatment. She states she is taking her calcium and vitamin D. She states she had a normal colonoscopy in 2013. Her last mammogram was this year which was reportedly normal. Patient denies any prior history of abnormal Pap smears. Her colonoscopy was reported to be normal this year. Her follow has history of colon cancer.  Past medical history,surgical history, family history and social history were all reviewed and documented in the EPIC chart.  Gynecologic History No LMP recorded. Patient is postmenopausal. Contraception: none Last Pap: 2012. Results were: normal Last mammogram: 2013. Results were: normal  Obstetric History OB History    Grav Para Term Preterm Abortions TAB SAB Ect Mult Living   4 3 3  1  1   3      # Outc Date GA Lbr Len/2nd Wgt Sex Del Anes PTL Lv   1 TRM     F SVD  No Yes   2 TRM     M SVD  No Yes   3 TRM     F SVD  No Yes   4 SAB                ROS: A ROS was performed and pertinent positives and negatives are included in the history.  GENERAL: No fevers or chills. HEENT: No change in vision, no earache, sore throat or sinus congestion. NECK: No  pain or stiffness. CARDIOVASCULAR: No chest pain or pressure. No palpitations. PULMONARY: No shortness of breath, cough or wheeze. GASTROINTESTINAL: No abdominal pain, nausea, vomiting or diarrhea, melena or bright red blood per rectum. GENITOURINARY: No urinary frequency, urgency, hesitancy or dysuria. MUSCULOSKELETAL: No joint or muscle pain, no back pain, no recent trauma. DERMATOLOGIC: No rash, no itching, no lesions. ENDOCRINE: No polyuria, polydipsia, no heat or cold intolerance. No recent change in weight. HEMATOLOGICAL: No anemia or easy bruising or bleeding. NEUROLOGIC: No headache, seizures, numbness, tingling or weakness. PSYCHIATRIC: No depression, no loss of interest in normal activity or change in sleep pattern.     Exam: chaperone present  BP 112/70  Ht 5' 6.5" (1.689 m)  Wt 148 lb (67.132 kg)  BMI 23.53 kg/m2  Body mass index is 23.53 kg/(m^2).  General appearance : Well developed well nourished female. No acute distress HEENT: Neck supple, trachea midline, no carotid bruits, no thyroidmegaly Lungs: Clear to auscultation, no rhonchi or wheezes, or rib retractions  Heart: Regular rate and rhythm, no murmurs or gallops Breast:Examined in sitting and supine position were symmetrical in appearance, no palpable masses or tenderness,  no skin retraction, no nipple inversion, no nipple discharge, no skin discoloration, no axillary or supraclavicular lymphadenopathy  Abdomen: no palpable masses or tenderness, no rebound or guarding Extremities: no edema or skin discoloration or tenderness  Pelvic:  Bartholin, Urethra, Skene Glands: Within normal limits             Vagina: No gross lesions or discharge  Cervix: No gross lesions or discharge  Uterus  anteverted, normal size, shape and consistency, non-tender and mobile  Adnexa  Without masses or tenderness  Anus and perineum  normal   Rectovaginal  normal sphincter tone without palpated masses or tenderness             Hemoccult not  done since colonoscopy was done this year and was normal.     Assessment/Plan:  66 y.o. female for annual exam who is being followed by holistic physician in [facility] as well as her PCP. We discussed the concerns about half for continuous hormone replacement therapy consisting of estrogen cream and Prometrium daily with potential risk of breast cancer. We discussed the ACOG guidelines which states that cyclical progesterone instead of continuous poses less of a risk for breast cancer. She will discuss this with her other provider. We did do a Pap smear today and this would be her last period she is now 66 years of age and has had no prior history of abnormal Pap smears. She was encouraged to do her monthly self breast examination. We discussed importance of calcium and vitamin D for osteoporosis prevention. We'll schedule a bone density study here in the office the next couple weeks and we will try to obtain a copy of her last bone density study for comparison and assessment.    Ok Edwards MD, 3:36 PM 03/30/2012

## 2012-03-30 NOTE — Patient Instructions (Signed)
Please have Dr. Clelia Croft send [place] your last bone density report.  Remember to ask Dr. Kalman Shan about changing your estrogen/progesterone regimen for cyclical progesterone and not continuously so as to not increase the risk of breast cancer.

## 2012-03-31 ENCOUNTER — Ambulatory Visit: Payer: BC Managed Care – PPO | Admitting: Gynecology

## 2012-04-13 ENCOUNTER — Encounter: Payer: Self-pay | Admitting: Gynecology

## 2012-04-13 ENCOUNTER — Ambulatory Visit (INDEPENDENT_AMBULATORY_CARE_PROVIDER_SITE_OTHER): Payer: BC Managed Care – PPO | Admitting: Gynecology

## 2012-04-13 VITALS — BP 116/70

## 2012-04-13 DIAGNOSIS — Z79890 Hormone replacement therapy (postmenopausal): Secondary | ICD-10-CM

## 2012-04-13 DIAGNOSIS — B9689 Other specified bacterial agents as the cause of diseases classified elsewhere: Secondary | ICD-10-CM

## 2012-04-13 DIAGNOSIS — N950 Postmenopausal bleeding: Secondary | ICD-10-CM

## 2012-04-13 DIAGNOSIS — N760 Acute vaginitis: Secondary | ICD-10-CM

## 2012-04-13 DIAGNOSIS — N898 Other specified noninflammatory disorders of vagina: Secondary | ICD-10-CM

## 2012-04-13 DIAGNOSIS — A499 Bacterial infection, unspecified: Secondary | ICD-10-CM

## 2012-04-13 LAB — WET PREP FOR TRICH, YEAST, CLUE
Clue Cells Wet Prep HPF POC: NONE SEEN
Trich, Wet Prep: NONE SEEN
Yeast Wet Prep HPF POC: NONE SEEN

## 2012-04-13 MED ORDER — CLINDAMYCIN PHOSPHATE 2 % VA CREA: 1.0000 | TOPICAL_CREAM | Freq: Every day | VAGINAL | Status: DC

## 2012-04-13 NOTE — Progress Notes (Signed)
Patient presented to the office today to once again rediscuss issues of her hormone replacement therapy. She also was complaining of slight vaginal discharge today as well. Patient was seen in the office on November 5 as a new patient.Patient states in 2009 she had a laparoscopic bilateral salpingo-oophorectomy was benign. She's also seen her primary care physician a Dr. Martha Clan who has been doing her labs as well as her holistic medicine doctor Dr.Agustedes. They both had been drawing her labs and been treating her for her lupus as well as for her hypothyroidism. Her holistic medicine physician has had her on estrogen cream 0.125 daily along with Prometrium 200 mg daily. On the last visit we had discussed the ACOG guidelines as well as detailed discussion of the women's health initiative study indicating that women who need to be on hormone replacement therapy should be on a cyclical regimen and not continuous regimen of estrogen and progesterone to minimize the risk of breast cancer. She brought to my attention that from her last office visit that we had done her Pap smear she had some spotting afterwards. She became frightened discontinued the Prometrium to continue the transdermal estrogen for a few days which she is currently on.  Exam: Bartholin urethra Skene glands within normal limits Vagina: No lesions or discharge Cervix no gross lesions or discharge Uterus: Anteverted normal size shape and consistency Adnexa: No palpable masses or tenderness Rectal exam not done  Wet prep: Moderate white blood cells and many bacteria  Assessment/plan: Clarification on the ACOG guidelines. Patient feels comfortable with this. She will use the transdermal estrogen cream 0.125 mg daily and take Prometrium 200 mg for the first 12 days of the month. She knows to report to the office if there is any irregular bleeding other than a normal cyclical bleed every 28-35 days. If she does have any regular bleeding she  knows to return to the office for an endometrial biopsy. Literature information was also provided on by identical hormones as well. For her mild bacterial vaginosis and she will be placed on Cleocin 2% vaginal cream to apply each bedtime for 5 nights.

## 2012-04-13 NOTE — Patient Instructions (Addendum)
Recommended web site: [employer] Men[employer] and ACOG [employer] of Ob/G[employer]

## 2012-12-16 ENCOUNTER — Ambulatory Visit: Payer: Self-pay | Admitting: Gynecology

## 2013-01-18 ENCOUNTER — Other Ambulatory Visit: Payer: Self-pay | Admitting: Internal Medicine

## 2013-01-18 DIAGNOSIS — R102 Pelvic and perineal pain: Secondary | ICD-10-CM

## 2013-01-18 DIAGNOSIS — R109 Unspecified abdominal pain: Secondary | ICD-10-CM

## 2013-01-25 ENCOUNTER — Ambulatory Visit
Admission: RE | Admit: 2013-01-25 | Discharge: 2013-01-25 | Disposition: A | Discharge status: Home / Self Care | Payer: BC Managed Care – PPO | Source: Ambulatory Visit | Attending: Internal Medicine | Admitting: Internal Medicine

## 2013-01-25 ENCOUNTER — Telehealth: Payer: Self-pay | Admitting: Cardiovascular Disease

## 2013-01-25 DIAGNOSIS — R102 Pelvic and perineal pain: Secondary | ICD-10-CM

## 2013-01-25 DIAGNOSIS — R109 Unspecified abdominal pain: Secondary | ICD-10-CM

## 2013-01-25 MED ORDER — IOHEXOL 300 MG/ML  SOLN
100.0000 mL | Freq: Once | INTRAMUSCULAR | Status: AC | PRN
  Administered 2013-01-25: 100 mL via INTRAVENOUS

## 2013-01-25 NOTE — Telephone Encounter (Signed)
Please call asap-Pt Is there-says she was allergic to some medicine when she had a test here a year ago.Need to know what it was.

## 2013-01-25 NOTE — Telephone Encounter (Signed)
Spoke with Lori [name] and gave her the patient's allergies that we have listed in the chart. I do not see anything  Noted anywhere that patient had a reaction to anything while testing here.

## 2013-03-02 ENCOUNTER — Other Ambulatory Visit: Payer: Self-pay | Admitting: Dermatology

## 2013-03-02 DIAGNOSIS — B350 Tinea barbae and tinea capitis: Secondary | ICD-10-CM

## 2013-03-15 ENCOUNTER — Ambulatory Visit: Payer: BC Managed Care – PPO | Admitting: Cardiovascular Disease

## 2013-03-17 ENCOUNTER — Encounter: Payer: Self-pay | Admitting: Cardiovascular Disease

## 2013-03-28 ENCOUNTER — Ambulatory Visit: Payer: Self-pay | Admitting: Podiatry

## 2013-03-28 ENCOUNTER — Other Ambulatory Visit: Payer: Self-pay

## 2013-03-28 DIAGNOSIS — Z1231 Encounter for screening mammogram for malignant neoplasm of breast: Secondary | ICD-10-CM

## 2013-03-29 ENCOUNTER — Ambulatory Visit (INDEPENDENT_AMBULATORY_CARE_PROVIDER_SITE_OTHER): Payer: BC Managed Care – PPO | Admitting: Cardiovascular Disease

## 2013-03-29 ENCOUNTER — Encounter: Payer: Self-pay | Admitting: Cardiovascular Disease

## 2013-03-29 VITALS — BP 132/80 | HR 78 | Ht 67.0 in | Wt 147.4 lb

## 2013-03-29 DIAGNOSIS — Z9889 Other specified postprocedural states: Secondary | ICD-10-CM

## 2013-03-29 DIAGNOSIS — R002 Palpitations: Secondary | ICD-10-CM

## 2013-03-29 DIAGNOSIS — R5381 Other malaise: Secondary | ICD-10-CM

## 2013-03-29 DIAGNOSIS — Z87448 Personal history of other diseases of urinary system: Secondary | ICD-10-CM

## 2013-03-29 DIAGNOSIS — R5382 Chronic fatigue, unspecified: Secondary | ICD-10-CM

## 2013-03-29 MED ORDER — METOPROLOL SUCCINATE ER 25 MG PO TB24: 25.0000 mg | ORAL_TABLET | Freq: Every day | ORAL | Status: DC

## 2013-03-29 NOTE — Patient Instructions (Signed)
Your physician has recommended you make the following change in your medication: STOP the metoprolol tartrate. Start the new prescription for the metoprolol long acting. This has already been sent to the pharmacy.  Your physician recommends that you schedule a follow-up appointment in: 1 YEAR.

## 2013-03-31 ENCOUNTER — Ambulatory Visit (INDEPENDENT_AMBULATORY_CARE_PROVIDER_SITE_OTHER): Payer: BC Managed Care – PPO | Admitting: Gynecology

## 2013-03-31 ENCOUNTER — Encounter: Payer: Self-pay | Admitting: Gynecology

## 2013-03-31 VITALS — BP 120/76 | Ht 66.0 in | Wt 148.2 lb

## 2013-03-31 DIAGNOSIS — N951 Menopausal and female climacteric states: Secondary | ICD-10-CM

## 2013-03-31 DIAGNOSIS — Z1159 Encounter for screening for other viral diseases: Secondary | ICD-10-CM

## 2013-03-31 DIAGNOSIS — M8580 Other specified disorders of bone density and structure, unspecified site: Secondary | ICD-10-CM

## 2013-03-31 DIAGNOSIS — M899 Disorder of bone, unspecified: Secondary | ICD-10-CM

## 2013-03-31 NOTE — Patient Instructions (Signed)
Bone Densitometry Bone densitometry is a special X-ray that measures your bone density and can be used to help predict your risk of bone fractures. This test is used to determine bone mineral content and density to diagnose osteoporosis. Osteoporosis is the loss of bone that may cause the bone to become weak. Osteoporosis commonly occurs in women entering menopause. However, it may be found in men and in people with other diseases. PREPARATION FOR TEST No preparation necessary. WHO SHOULD BE TESTED?  All women older than 65.  Postmenopausal women (50 to 65) with risk factors for osteoporosis.  People with a previous fracture caused by normal activities.  People with a small body frame (less than 127 poundsor a body mass index [BMI] of less than 21).  People who have a parent with a hip fracture or history of osteoporosis.  People who smoke.  People who have rheumatoid arthritis.  Anyone who engages in excessive alcohol use (more than 3 drinks most days).  Women who experience early menopause. WHEN SHOULD YOU BE RETESTED? Current guidelines suggest that you should wait at least 2 years before doing a bone density test again if your first test was normal.Recent studies indicated that women with normal bone density may be able to wait a few years before needing to repeat a bone density test. You should discuss this with your caregiver.  NORMAL FINDINGS   Normal: less than standard deviation below normal (greater than -1).  Osteopenia: 1 to 2.5 standard deviations below normal (-1 to -2.5).  Osteoporosis: greater than 2.5 standard deviations below normal (less than -2.5). Test results are reported as a "T score" and a "Z score."The T score is a number that compares your bone density with the bone density of healthy, young women.The Z score is a number that compares your bone density with the scores of women who are the same age, gender, and race.  Ranges for normal findings may vary  among different laboratories and hospitals. You should always check with your doctor after having lab work or other tests done to discuss the meaning of your test results and whether your values are considered within normal limits. MEANING OF TEST  Your caregiver will go over the test results with you and discuss the importance and meaning of your results, as well as treatment options and the need for additional tests if necessary. OBTAINING THE TEST RESULTS It is your responsibility to obtain your test results. Ask the lab or department performing the test when and how you will get your results. Document Released: 06/03/2004 Document Revised: 08/04/2011 Document Reviewed: 06/26/2010 [employer] Patient Information 2014 [employer].  

## 2013-03-31 NOTE — Progress Notes (Signed)
Lori [name] [birth year] [id number]   History:    67 y.o.  for annual gyn exam were the only complaint has been of a right breast upper outer quadrant tenderness. Patient recently had bladder sling revision in [facility]. Patient has had a history of laparoscopic BSO in the past benign pathology. Patient would know prior history of abnormal Pap smear. Colonoscopy 2013 normal (father with history of colon cancer). Patient was seen in the office in November 2013 to discuss hormone replacement issues.She's also seen her primary care physician a Dr. Martha Clan who has been doing her labs as well as her holistic medicine doctor Dr.Agustedes. They both had been drawing her labs and been treating her for her lupus as well as for her hypothyroidism. Her holistic medicine physician has had her on estrogen cream 0.125 daily along with Prometrium 200 mg daily. On the last visit we had discussed the ACOG guidelines as well as detailed discussion of the women's health initiative study indicating that women who need to be on hormone replacement therapy should be on a cyclical regimen and not continuous regimen of estrogen and progesterone to minimize the risk of breast cancer.   Patient had a bone density study in another facility in 2012In which her report stated that her voice T score was -1.9 in the spine as well as in the left femoral neck BUT NORMAL FRAX analysis.    Past medical history,surgical history, family history and social history were all reviewed and documented in the EPIC chart.  Gynecologic History No LMP recorded. Patient is postmenopausal. Contraception: post menopausal status Last Pap: 2013. Results were: normal Last mammogram: 2013. Results were: normal  Obstetric History OB History  Gravida Para Term Preterm AB SAB TAB Ectopic Multiple Living  4 3 3  1 1    3     # Outcome Date GA Lbr Len/2nd Weight Sex Delivery Anes PTL Lv  4 SAB           3 TRM     F SVD  N Y  2  TRM     M SVD  N Y  1 TRM     F SVD  N Y       ROS: A ROS was performed and pertinent positives and negatives are included in the history.  GENERAL: No fevers or chills. HEENT: No change in vision, no earache, sore throat or sinus congestion. NECK: No pain or stiffness. CARDIOVASCULAR: No chest pain or pressure. No palpitations. PULMONARY: No shortness of breath, cough or wheeze. GASTROINTESTINAL: No abdominal pain, nausea, vomiting or diarrhea, melena or bright red blood per rectum. GENITOURINARY: No urinary frequency, urgency, hesitancy or dysuria. MUSCULOSKELETAL: No joint or muscle pain, no back pain, no recent trauma. DERMATOLOGIC: No rash, no itching, no lesions. ENDOCRINE: No polyuria, polydipsia, no heat or cold intolerance. No recent change in weight. HEMATOLOGICAL: No anemia or easy bruising or bleeding. NEUROLOGIC: No headache, seizures, numbness, tingling or weakness. PSYCHIATRIC: No depression, no loss of interest in normal activity or change in sleep pattern.     Exam: chaperone present  BP 120/76  Ht 5\' 6"  (1.676 m)  Wt 148 lb 3.2 oz (67.223 kg)  BMI 23.93 kg/m2  Body mass index is 23.93 kg/(m^2).  General appearance : Well developed well nourished female. No acute distress HEENT: Neck supple, trachea midline, no carotid bruits, no thyroidmegaly Lungs: Clear to auscultation, no rhonchi or wheezes, or rib retractions  Heart: Regular rate and rhythm, no  murmurs or gallops Breast:Examined in sitting and supine position were symmetrical in appearance, no palpable masses or tenderness,  no skin retraction, no nipple inversion, no nipple discharge, no skin discoloration, no axillary or supraclavicular lymphadenopathy Abdomen: no palpable masses or tenderness, no rebound or guarding Extremities: no edema or skin discoloration or tenderness  Pelvic:  Bartholin, Urethra, Skene Glands: Within normal limits             Vagina: No gross lesions or discharge  Cervix: No gross lesions  or discharge  Uterus  anteverted, normal size, shape and consistency, non-tender and mobile  Adnexa  Without masses or tenderness  Anus and perineum  normal   Rectovaginal  normal sphincter tone without palpated masses or tenderness             Hemoccult PCP provides     Assessment/Plan:  67 y.o. female for annual exam who will need to schedule bone density study. PCP has been drawn her labs. Once again we had clarification on  ACOG guidelines. I explained to her that her right breast tenderness may be attributed to her daily for dose of progesterone. I raised the concern is that I have it wished that she would be at an increased risk for breast cancer. She states that she has to first-line relatives with breast cancer and I have recommended that she see a Dentist for BRCA1 and BRCA2 testing. She states that she will talk this over with her other physician who has her on the transdermal estrogen cream 0.125 mg daily and take Prometrium 200 mg for the first 12 days of the month. She denies any vaginal bleeding. She will schedule a bone density study here in the office. Pap smear not done today in accordance with a new guidelines.  New CDC guidelines is recommending patients be tested once in her lifetime for hepatitis C antibody who were born between [birth year] through 1964. This was discussed with the patient today and has agreed to be tested today. . Note: This dictation was prepared with  Dragon/digital dictation along withSmart phrase technology. Any transcriptional errors that result from this process are unintentional.   Ok Edwards MD, 3:00 PM 03/31/2013

## 2013-04-14 ENCOUNTER — Ambulatory Visit: Payer: BC Managed Care – PPO | Admitting: Podiatry

## 2013-04-14 ENCOUNTER — Encounter: Payer: Self-pay | Admitting: Podiatry

## 2013-04-14 ENCOUNTER — Ambulatory Visit (INDEPENDENT_AMBULATORY_CARE_PROVIDER_SITE_OTHER): Payer: BC Managed Care – PPO

## 2013-04-14 VITALS — BP 99/68 | HR 86 | Resp 12

## 2013-04-14 DIAGNOSIS — R52 Pain, unspecified: Secondary | ICD-10-CM

## 2013-04-14 DIAGNOSIS — G5761 Lesion of plantar nerve, right lower limb: Secondary | ICD-10-CM

## 2013-04-14 DIAGNOSIS — B351 Tinea unguium: Secondary | ICD-10-CM

## 2013-04-14 DIAGNOSIS — M7750 Other enthesopathy of unspecified foot: Secondary | ICD-10-CM

## 2013-04-14 DIAGNOSIS — G5760 Lesion of plantar nerve, unspecified lower limb: Secondary | ICD-10-CM

## 2013-04-14 NOTE — Progress Notes (Signed)
N- SORE L-B/L FOOT D-LONG TERM O-SLOWLY C-WORSE A-PRESSURE T-N/A

## 2013-04-14 NOTE — Patient Instructions (Signed)

## 2013-04-15 NOTE — Progress Notes (Signed)
Subjective:     Patient ID: Lori [name], female   DOB: 1946-03-09, 67 y.o.   MRN: [id number]  Foot Pain   patient is found to have pain in the right forefoot that has become more intense over the last few months and is noted to have generalized pain in both posterior heel areas especially when laying down. States that she had a long-term urinary tract infection and seemed to develop pain associated with that in the antibiotics have been helping the posterior pain   Review of Systems  All other systems reviewed and are negative.       Objective:   Physical Exam  Constitutional: She is oriented to person, place, and time. She appears well-developed and well-nourished.  Cardiovascular: Intact distal pulses.   Musculoskeletal: Normal range of motion.  Neurological: She is oriented to person, place, and time.  Skin: Skin is warm.   patient is found to have shooting pains occurring third interspace right with radiating discomfort into the adjacent digits. Neurovascular status intact with muscle strength normal and mild equinus condition noted. Mild discomfort posterior aspect heel bilateral with no edema or erythema associated with it and digital deformities with contracture of the lesser digits noted both feet. Also complains of thinning discoloration of nailbeds    Assessment:     Probable neuroma symptomatology right foot. Mild Achilles tendinitis with possible re\re relationship to UTI bilateral and mild fungal infection big toenail both feet    Plan:     Reviewed all conditions with patient and gave instructions on stretching for the Achilles tendon. Today I did a sterile prep of the right forefoot and injected with a purified D. hydrated alcohol solution and Marcaine injection to provide neuro lysis of the intermetatarsal nerve and advised on wide shoe gear

## 2013-04-23 ENCOUNTER — Encounter: Payer: Self-pay | Admitting: Cardiovascular Disease

## 2013-04-23 DIAGNOSIS — R002 Palpitations: Secondary | ICD-10-CM | POA: Insufficient documentation

## 2013-04-23 DIAGNOSIS — R5382 Chronic fatigue, unspecified: Secondary | ICD-10-CM | POA: Insufficient documentation

## 2013-04-23 DIAGNOSIS — Z9889 Other specified postprocedural states: Secondary | ICD-10-CM | POA: Insufficient documentation

## 2013-04-23 NOTE — Progress Notes (Signed)
Patient ID: Lori [name], female   DOB: 1945/09/11, 67 y.o.   MRN: [id number]     PATIENT PROFILE:  [name] to establish cardiologic care with me. She is a former patient of Dr. Alanda Amass who has since retired from his sole practice.   HPI: [name] has a history of episodic palpitations in the past has been demonstrated to have a PCs with short runs of atrial tachycardia. She also has a history of bowel syndrome, chronic fatigue, hypothyroidism. She also is seen Dr. Acquanetta Belling [facility]  in the past and is on numerous vitamin and supplement medications. She has had issues with bladder infections, and underwent bladder tacking surgery in July. She sees Dr. Charlean Sanfilippo of urology in [facility].  In 2012 an echo Doppler study showed mild concentric left ventricular hypertrophy with normal systolic and diastolic function. There was trivial mitral and tricuspid regurgitation. She previously has had a normal Myoview scan in 2013 she also had carotid studies which essentially were normal. She does take numerous herbal type medications as well as vitamins. She also is taking Dexatrim. She denies any recent episodes of chest pain. There are no episodes of presyncope or syncope. She does note a rare palpitation.  Past Medical History  Diagnosis Date  . Lupus   . Hormone disorder   . Hypothyroidism   . Palpitations   . IBS (irritable bowel syndrome)   . Chronic fatigue     Past Surgical History  Procedure Laterality Date  . Breast surgery  1980    LUMPECTOMY X 2 FROM RIGHT BREAST  . Tubal ligation    . Abdominal surgery      ABDOMINOPLASTY  . Laparoscopic cholecystectomy    . Tonsillectomy and adenoidectomy    . Eye surgery    . Bladder suspension    . Carotid doppler  08/28/2011    Normal, no evidence of significant diameter reduction, dissection, or vascular abnormality  . Cardiovascular stress test  08/28/2011    Normal  .  Transthoracic echocardiogram  01/29/2011    EF >55%, mild concentric LVH    Allergies  Allergen Reactions  . Codeine   . Erythromycin   . Metoclopramide Hcl     Current Outpatient Prescriptions  Medication Sig Dispense Refill  . Ascorbic Acid (VITAMIN C PO) Take 3,000 mg by mouth daily.      . Cholecalciferol (VITAMIN D PO) Take 1 tablet by mouth daily.      . ciprofloxacin (CIPRO) 250 MG tablet Take 1 tablet by mouth at bedtime.      . clindamycin (CLEOCIN) 2 % vaginal cream Place 1 Applicatorful vaginally at bedtime.  40 g  0  . Coenzyme Q10 (CO Q 10 PO) Take 1 tablet by mouth daily.      Marland Kitchen dextroamphetamine (DEXTROSTAT) 5 MG tablet Take 5 mg by mouth daily.      . fish oil-omega-3 fatty acids 1000 MG capsule Take 1 g by mouth daily.      . Glucosamine HCl (GLUCOSAMINE PO) Take 1 tablet by mouth 2 (two) times daily.      Marland Kitchen GLUTAMINE PO Take 1 tablet by mouth 2 (two) times daily.      Marland Kitchen LORazepam (ATIVAN) 1 MG tablet Take 1 mg by mouth as needed.      Marland Kitchen LYSINE PO Take 1 tablet by mouth 3 (three) times a week.      Marland Kitchen MAGNESIUM PO Take  1 tablet by mouth daily.      Marland Kitchen MELATONIN PO Take 1 tablet by mouth at bedtime.      . Multiple Vitamin (MULTIVITAMIN) tablet Take 1 tablet by mouth daily.      Marland Kitchen OVER THE COUNTER MEDICATION Take 1 tablet by mouth daily. Nature thyroid      . Prasterone, DHEA, 10 MG CAPS Take 7.5 mg by mouth daily.      Marland Kitchen PRESCRIPTION MEDICATION BIEST 90/10 ESTROGEN CREAM 0.125 MG      . Probiotic Product (PROBIOTIC DAILY PO) Take 1 tablet by mouth 2 (two) times daily.      . progesterone (PROMETRIUM) 200 MG capsule Take 200 mg by mouth daily.      . Simethicone 180 MG CAPS Take 1 capsule by mouth as needed.      . metoprolol succinate (TOPROL XL) 25 MG 24 hr tablet Take 1 tablet (25 mg total) by mouth daily.  30 tablet  6  . nitrofurantoin, macrocrystal-monohydrate, (MACROBID) 100 MG capsule Take 1 capsule by mouth daily.      Marland Kitchen omeprazole (PRILOSEC) 40 MG capsule Take  1 capsule by mouth at bedtime.       No current facility-administered medications for this visit.    @SOCIALHX @  Family History  Problem Relation Age of Onset  . Seizures Mother   . Transient ischemic attack Mother   . Cancer Father 59    COLON  . Diabetes Father   . Heart disease Father   . Breast cancer Maternal Aunt   . Heart disease Paternal Uncle   . Breast cancer Maternal Aunt    Socially she is married for 42 years. She has 3 children and 2 grandchildren. She completed college. She does drink occasional wine. There is no tobacco history. She does walk occasionally.  ROS is negative for fever chills or night sweats. She denies any skin rash. She does admit to chronic fatigue. She denies change in vision or hearing. She is unaware of lymphadenopathy. She denies wheezing. She denies cough or increased sputum production. There is no presyncope or syncope. She denies exertionally precipitated chest pain. At times she does note some reflux. She denies blood in her stool or urine. She has had a urine infection in the past. She status post bladder surgery. She denies claudication. She denies tremors. There is no diabetes. She is unaware of cold or heat intolerance or thyroid abnormalities. She takes Dexedrine for chronic fatigue. She is unaware of snoring earache he denies bruxism. She denies restless legs. Other comprehensive 14 point system review is negative.  PE BP 132/80  Pulse 78  Ht 5\' 7"  (1.702 m)  Wt 147 lb 6.4 oz (66.86 kg)  BMI 23.08 kg/m2 General: Alert, oriented, no distress.  Skin: normal turgor, no rashes HEENT: Normocephalic, atraumatic. Pupils round and reactive; sclera anicteric; Fundi without hemorrhages or exudates. Nose without nasal septal hypertrophy Mouth/Parynx benign; Mallinpatti scale 2 Neck: No JVD, no carotid bruits Lungs: clear to ausculatation and percussion; no wheezing or rales Heart: RRR, s1 s2 normal faint 1/6 systolic murmur; no diastolic  murmur No chest wall tenderness to palpation Abdomen: soft, nontender; no hepatosplenomehaly, BS+; abdominal aorta nontender and not dilated by palpation. Pulses 2+ Extremities: no clubbinbg cyanosis or edema, Homan's sign negative  Neurologic: grossly nonfocal; no pathologic reflex Psychologic: Normal mood and affect    ECG: Sinus rhythm with PAC. QTc interval 442 ms. PR interval 200 ms  LABS:  BMET  Component Value Date/Time   NA 138 09/07/2010 2340   K 3.9 09/07/2010 2340   CL 106 09/07/2010 2340   CO2 22 09/07/2010 2340   GLUCOSE 159* 09/07/2010 2340   BUN 24* 09/07/2010 2340   CREATININE 0.78 09/07/2010 2340   CALCIUM 9.1 09/07/2010 2340   GFRNONAA >60 09/07/2010 2340   GFRAA  Value: >60        The eGFR has been calculated using the MDRD equation. This calculation has not been validated in all clinical situations. eGFR's persistently <60 mL/min signify possible Chronic Kidney Disease. 09/07/2010 2340     Hepatic Function Panel     Component Value Date/Time   PROT 6.1 08/18/2008 0248   ALBUMIN 3.8 08/18/2008 0248   AST 49* 08/18/2008 0248   ALT 52* 08/18/2008 0248   ALKPHOS 82 08/18/2008 0248   BILITOT 0.8 08/18/2008 0248   BILIDIR 0.3 05/01/2008 0705   IBILI 1.1* 05/01/2008 0705     CBC    Component Value Date/Time   WBC 8.9 09/07/2010 2340   RBC 5.07 09/07/2010 2340   HGB 16.2* 09/07/2010 2340   HCT 46.5* 09/07/2010 2340   PLT 174 09/07/2010 2340   MCV 91.7 09/07/2010 2340   MCH 32.0 09/07/2010 2340   MCHC 34.8 09/07/2010 2340   RDW 12.9 09/07/2010 2340   LYMPHSABS 0.2* 09/07/2010 2340   MONOABS 0.4 09/07/2010 2340   EOSABS 0.0 09/07/2010 2340   BASOSABS 0.0 09/07/2010 2340     BNP No results found for this basename: probnp    Lipid Panel     Component Value Date/Time   CHOL  Value: 209        ATP III CLASSIFICATION:  <200     mg/dL   Desirable  846-962  mg/dL   Borderline High  >=952    mg/dL   High       * 8/41/3244 0250   TRIG 87 08/18/2008 0250   HDL 58 08/18/2008  0250   CHOLHDL 3.6 08/18/2008 0250   VLDL 17 08/18/2008 0250   LDLCALC  Value: 134        Total Cholesterol/HDL:CHD Risk Coronary Heart Disease Risk Table                     Men   Women  1/2 Average Risk   3.4   3.3  Average Risk       5.0   4.4  2 X Average Risk   9.6   7.1  3 X Average Risk  23.4   11.0        Use the calculated Patient Ratio above and the CHD Risk Table to determine the patient's CHD Risk.        ATP III CLASSIFICATION (LDL):  <100     mg/dL   Optimal  010-272  mg/dL   Near or Above                    Optimal  130-159  mg/dL   Borderline  536-644  mg/dL   High  >034     mg/dL   Very High* 7/42/5956 0250     RADIOLOGY: Dg Foot Complete Left  04/17/2013   Multiple view x-rays of left foot indicate no signs of stress fracture  with what appears to be arthritis at the fifth MPJ with narrowing of the  joint surface noted x-ray  Dg Foot Complete Right  04/17/2013   Multiple view x-rays of  right foot indicate good alignment with no signs  of stress fracture or arthritis noted    ASSESSMENT AND PLAN:  My impression is that [name] her is a 67 year old female who is on multiple vitamins and additional supplemental medications prescribed by Dr. Woodward Ku at [facility]. Her ECG does show a rare PAC. In the past she has been demonstrated to have short bursts of atrial tachycardia. She has been taking metoprolol tartrate 25 mg just at bedtime. I explained to her that this is a short acting medication and will not last 24-hour duration. I suggested that she change this to metoprolol succinate 25 mg. I did review some of the old studies that had been done in the past to her. Presently, her blood pressure is controlled. I will try to obtain laboratory that may have been done recently I will see her in one year for cardiology reassessment or sooner if problem arise.  Lennette Bihari, MD, [facility] 04/23/2013 1:47 PM

## 2013-04-26 ENCOUNTER — Ambulatory Visit: Payer: BC Managed Care – PPO

## 2013-04-27 ENCOUNTER — Ambulatory Visit
Admission: RE | Admit: 2013-04-27 | Discharge: 2013-04-27 | Disposition: A | Discharge status: Home / Self Care | Payer: BC Managed Care – PPO | Source: Ambulatory Visit

## 2013-04-27 ENCOUNTER — Ambulatory Visit: Payer: BC Managed Care – PPO

## 2013-04-27 DIAGNOSIS — Z1231 Encounter for screening mammogram for malignant neoplasm of breast: Secondary | ICD-10-CM

## 2013-05-02 ENCOUNTER — Ambulatory Visit: Payer: BC Managed Care – PPO | Admitting: Podiatry

## 2013-05-05 ENCOUNTER — Encounter: Payer: Self-pay | Admitting: Podiatry

## 2013-05-05 ENCOUNTER — Ambulatory Visit (INDEPENDENT_AMBULATORY_CARE_PROVIDER_SITE_OTHER): Payer: BC Managed Care – PPO | Admitting: Podiatry

## 2013-05-05 VITALS — BP 109/65 | HR 76 | Resp 16

## 2013-05-05 DIAGNOSIS — M779 Enthesopathy, unspecified: Secondary | ICD-10-CM

## 2013-05-05 DIAGNOSIS — G5761 Lesion of plantar nerve, right lower limb: Secondary | ICD-10-CM

## 2013-05-05 DIAGNOSIS — G5760 Lesion of plantar nerve, unspecified lower limb: Secondary | ICD-10-CM

## 2013-05-05 NOTE — Patient Instructions (Signed)
Morton's Neuroma Neuralgia (nerve pain) or neuroma (benign [non-cancerous] nerve tumor) may develop on any interdigital nerve. The interdigital nerves (nerves between digits) of the foot travel beneath and between the metatarsals (long bones of the fore foot) and pass the nerve endings to the toes. The third interdigital is a common place for a small neuroma to form called Morton's neuroma. Another nerve to be affected commonly is the fourth interdigital nerve. This would be in approximately in the area of the base or ball under the bottom of your fourth toe. This condition occurs more commonly in women and is usually on one side. It is usually first noticed by pain radiating (spreading) to the ball of the foot or to the toes. CAUSES The cause of interdigital neuralgia may be from low grade repetitive trauma (damage caused by an accident) as in activities causing a repeated pounding of the foot (running, jumping etc.). It is also caused by improper footwear or recent loss of the fatty padding on the bottom of the foot. TREATMENT  The condition often resolves (goes away) simply with decreasing activity if that is thought to be the cause. Proper shoes are beneficial. Orthotics (special foot support aids) such as a metatarsal bar are often beneficial. This condition usually responds to conservative therapy, however if surgery is necessary it usually brings complete relief. HOME CARE INSTRUCTIONS   Apply ice to the area of soreness for 15-20 minutes, 03-04 times per day, while awake for the first 2 days. Put ice in a plastic bag and place a towel between the bag of ice and your skin.  Only take over-the-counter or prescription medicines for pain, discomfort, or fever as directed by your caregiver. MAKE SURE YOU:   Understand these instructions.  Will watch your condition.  Will get help right away if you are not doing well or get worse. Document Released: 08/18/2000 Document Revised: 08/04/2011  Document Reviewed: 05/12/2005 [employer] Patient Information 2014 [employer].  

## 2013-05-09 NOTE — Progress Notes (Signed)
Subjective:     Patient ID: Lori [name], female   DOB: 07-01-1945, 67 y.o.   MRN: [id number]  HPI patient states the posterior aspect of my heel is improving but still painful if I have been on it a lot   Review of Systems     Objective:   Physical Exam Neurovascular status intact with minimal discomfort posterior heel with mild equinus condition still noted    Assessment:     Tendinitis improving at the current time    Plan:     Advised on physical therapy heel lift therapy and ice therapy reappoint her recheck as needed

## 2013-06-30 ENCOUNTER — Ambulatory Visit (INDEPENDENT_AMBULATORY_CARE_PROVIDER_SITE_OTHER): Payer: BC Managed Care – PPO

## 2013-06-30 DIAGNOSIS — M8580 Other specified disorders of bone density and structure, unspecified site: Secondary | ICD-10-CM

## 2013-06-30 DIAGNOSIS — M949 Disorder of cartilage, unspecified: Secondary | ICD-10-CM

## 2013-06-30 DIAGNOSIS — M899 Disorder of bone, unspecified: Secondary | ICD-10-CM

## 2013-12-01 ENCOUNTER — Other Ambulatory Visit: Payer: Self-pay | Admitting: *Deleted

## 2013-12-01 MED ORDER — METOPROLOL SUCCINATE ER 25 MG PO TB24: 25.0000 mg | ORAL_TABLET | Freq: Every day | ORAL | Status: DC

## 2013-12-01 NOTE — Telephone Encounter (Signed)
Rx was sent to pharmacy electronically. 

## 2014-01-10 ENCOUNTER — Other Ambulatory Visit: Payer: Self-pay | Admitting: *Deleted

## 2014-01-11 ENCOUNTER — Other Ambulatory Visit: Payer: Self-pay | Admitting: *Deleted

## 2014-01-11 MED ORDER — METOPROLOL SUCCINATE ER 25 MG PO TB24: 25.0000 mg | ORAL_TABLET | Freq: Every day | ORAL | Status: DC

## 2014-01-11 NOTE — Telephone Encounter (Signed)
Rx refill sent to patient pharmacy   

## 2014-01-16 ENCOUNTER — Other Ambulatory Visit: Payer: Self-pay | Admitting: Cardiovascular Disease

## 2014-01-16 NOTE — Telephone Encounter (Signed)
Rx was sent to pharmacy electronically. 

## 2014-02-20 ENCOUNTER — Other Ambulatory Visit: Payer: Self-pay

## 2014-02-20 DIAGNOSIS — Z1231 Encounter for screening mammogram for malignant neoplasm of breast: Secondary | ICD-10-CM

## 2014-03-23 DIAGNOSIS — R350 Frequency of micturition: Secondary | ICD-10-CM | POA: Insufficient documentation

## 2014-03-23 DIAGNOSIS — R339 Retention of urine, unspecified: Secondary | ICD-10-CM | POA: Insufficient documentation

## 2014-03-23 DIAGNOSIS — R0789 Other chest pain: Secondary | ICD-10-CM | POA: Insufficient documentation

## 2014-03-23 DIAGNOSIS — R1310 Dysphagia, unspecified: Secondary | ICD-10-CM | POA: Insufficient documentation

## 2014-03-23 DIAGNOSIS — M351 Other overlap syndromes: Secondary | ICD-10-CM | POA: Insufficient documentation

## 2014-03-23 DIAGNOSIS — R12 Heartburn: Secondary | ICD-10-CM | POA: Insufficient documentation

## 2014-03-23 DIAGNOSIS — B3731 Acute candidiasis of vulva and vagina: Secondary | ICD-10-CM | POA: Insufficient documentation

## 2014-03-23 DIAGNOSIS — K219 Gastro-esophageal reflux disease without esophagitis: Secondary | ICD-10-CM | POA: Insufficient documentation

## 2014-03-23 DIAGNOSIS — R3916 Straining to void: Secondary | ICD-10-CM | POA: Insufficient documentation

## 2014-03-23 DIAGNOSIS — R3911 Hesitancy of micturition: Secondary | ICD-10-CM | POA: Insufficient documentation

## 2014-03-23 DIAGNOSIS — R32 Unspecified urinary incontinence: Secondary | ICD-10-CM | POA: Insufficient documentation

## 2014-03-23 DIAGNOSIS — IMO0002 Reserved for concepts with insufficient information to code with codable children: Secondary | ICD-10-CM | POA: Insufficient documentation

## 2014-03-23 DIAGNOSIS — R740 Nonspecific elevation of levels of transaminase and lactic acid dehydrogenase [LDH]: Secondary | ICD-10-CM

## 2014-03-23 DIAGNOSIS — B373 Candidiasis of vulva and vagina: Secondary | ICD-10-CM | POA: Insufficient documentation

## 2014-03-27 ENCOUNTER — Encounter: Payer: Self-pay | Admitting: Podiatry

## 2014-04-11 ENCOUNTER — Ambulatory Visit (INDEPENDENT_AMBULATORY_CARE_PROVIDER_SITE_OTHER): Payer: BC Managed Care – PPO | Admitting: Cardiovascular Disease

## 2014-04-11 ENCOUNTER — Encounter: Payer: Self-pay | Admitting: Cardiovascular Disease

## 2014-04-11 VITALS — BP 100/60 | HR 74 | Ht 66.5 in | Wt 152.0 lb

## 2014-04-11 DIAGNOSIS — R5382 Chronic fatigue, unspecified: Secondary | ICD-10-CM

## 2014-04-11 DIAGNOSIS — I498 Other specified cardiac arrhythmias: Secondary | ICD-10-CM

## 2014-04-11 DIAGNOSIS — M359 Systemic involvement of connective tissue, unspecified: Secondary | ICD-10-CM

## 2014-04-11 DIAGNOSIS — R002 Palpitations: Secondary | ICD-10-CM

## 2014-04-11 DIAGNOSIS — R0602 Shortness of breath: Secondary | ICD-10-CM

## 2014-04-11 DIAGNOSIS — Z87898 Personal history of other specified conditions: Secondary | ICD-10-CM

## 2014-04-11 DIAGNOSIS — I499 Cardiac arrhythmia, unspecified: Secondary | ICD-10-CM

## 2014-04-11 DIAGNOSIS — Z8679 Personal history of other diseases of the circulatory system: Secondary | ICD-10-CM

## 2014-04-11 MED ORDER — METOPROLOL SUCCINATE ER 25 MG PO TB24: ORAL_TABLET | ORAL | Status: DC

## 2014-04-11 NOTE — Patient Instructions (Signed)
Your physician has recommended you make the following change in your medication: your metoprolol succ dosage has been changed to 1 tablet in the morning and 1/2 tablet in the evening. A new prescription has been sent to [facility] [place] to reflect the dose change.   Your physician wants you to follow-up in: 1 year or sooner if needed with Dr. Claiborne Billings. You will receive a reminder letter in the mail two months in advance. If you don't receive a letter, please call our office to schedule the follow-up appointment.

## 2014-04-11 NOTE — Progress Notes (Signed)
Patient ID: Lori [name], female   DOB: [birth year], 68 y.o.   MRN: [id number]     HPI: Lori [name] is a former patient of Dr. Rollene Fare.  She established care with me one year ago.  She presents today for one-year cardiology evaluation.  Lori [name] has a history of episodic palpitations in the past has been demonstrated to have a PCs with short runs of atrial tachycardia. She  has a history of irritable bowel syndrome, chronic fatigue, hypothyroidism.  She has had issues with bladder infections, and underwent bladder tacking surgery in July. She sees Dr. Christena Deem of urology in [facility].  In 2012 an echo Doppler study showed mild concentric left ventricular hypertrophy with normal systolic and diastolic function. There was trivial mitral and tricuspid regurgitation. She previously has had a normal Myoview scan in 2013 she also had carotid studies which essentially were normal.   She sees Dr. Sid Falcon [facility]  every 3 months and is on numerous vitamin and supplement medications.She does take numerous herbal type medications as well as vitamins.  She states she was told of having possible low adrenal function.  She denies any recent episodes of chest pain. There are no episodes of presyncope or syncope. She does note a rare palpitation.  She also sees Dr. Lang Snow, for primary care.  She presents for one-year evaluation.   Past Medical History  Diagnosis Date  . Lupus   . Hormone disorder   . Hypothyroidism   . Palpitations   . IBS (irritable bowel syndrome)   . Chronic fatigue     Past Surgical History  Procedure Laterality Date  . Breast surgery  1980    LUMPECTOMY X 2 FROM RIGHT BREAST  . Tubal ligation    . Abdominal surgery      ABDOMINOPLASTY  . Laparoscopic cholecystectomy    . Tonsillectomy and adenoidectomy    . Eye surgery    . Bladder suspension    . Carotid doppler  08/28/2011    Normal, no evidence of significant diameter reduction,  dissection, or vascular abnormality  . Cardiovascular stress test  08/28/2011    Normal  . Transthoracic echocardiogram  01/29/2011    EF >55%, mild concentric LVH    Allergies  Allergen Reactions  . Albumin (Human)   . Codeine   . Erythromycin   . Meperidine   . Metoclopramide Hcl   . Other Other (See Comments)    Beef Chicken Dairy Eggs  . Shellfish-Derived Products     Current Outpatient Prescriptions  Medication Sig Dispense Refill  . Ascorbic Acid (VITAMIN C PO) Take 3,000 mg by mouth daily.    . Cholecalciferol (VITAMIN D PO) Take 1 tablet by mouth daily.    . ciprofloxacin (CIPRO) 250 MG tablet Take 1 tablet by mouth at bedtime.    . clindamycin (CLEOCIN) 2 % vaginal cream Place 1 Applicatorful vaginally at bedtime. 40 g 0  . Coenzyme Q10 (CO Q 10 PO) Take 1 tablet by mouth daily.    Marland Kitchen dextroamphetamine (DEXTROSTAT) 5 MG tablet Take 5 mg by mouth daily.    . fish oil-omega-3 fatty acids 1000 MG capsule Take 1 g by mouth daily.    . fluconazole (DIFLUCAN) 100 MG tablet Take 1 tablet by mouth as needed.    . Glucosamine HCl (GLUCOSAMINE PO) Take 1 tablet by mouth 2 (two) times daily.    Marland Kitchen GLUTAMINE PO Take 1 tablet by mouth 2 (two) times daily.    Marland Kitchen  LORazepam (ATIVAN) 0.5 MG tablet Take 1 tablet by mouth at bedtime.    Marland Kitchen LYSINE PO Take 1 tablet by mouth 3 (three) times a week.    Marland Kitchen MAGNESIUM PO Take 1 tablet by mouth daily.    Marland Kitchen MELATONIN PO Take 1 tablet by mouth at bedtime.    . metoprolol succinate (TOPROL-XL) 25 MG 24 hr tablet Take 1 tablet (25 mg total) by mouth daily. 30 tablet 3  . Multiple Vitamin (MULTIVITAMIN) tablet Take 1 tablet by mouth daily.    Marland Kitchen omeprazole (PRILOSEC) 40 MG capsule Take 1 capsule by mouth at bedtime.    . potassium chloride SA (K-DUR,KLOR-CON) 20 MEQ tablet Take 1 tablet by mouth daily.    . Prasterone, DHEA, 10 MG CAPS Take 7.5 mg by mouth daily.    Marland Kitchen PRESCRIPTION MEDICATION BIEST 90/10 ESTROGEN CREAM 0.125 MG    . Probiotic Product  (PROBIOTIC DAILY PO) Take 1 tablet by mouth 2 (two) times daily.    . progesterone (PROMETRIUM) 200 MG capsule Take 200 mg by mouth daily.    . Simethicone 180 MG CAPS Take 1 capsule by mouth as needed.    . Thyroid (NATURE-THROID) 97.5 MG TABS Take 1 tablet by mouth daily.    . TRAVATAN Z 0.004 % SOLN ophthalmic solution Place 1 drop into both eyes at bedtime.  2   No current facility-administered medications for this visit.    @SOCIALHX @  Family History  Problem Relation Age of Onset  . Seizures Mother   . Transient ischemic attack Mother   . Cancer Father 73    COLON  . Diabetes Father   . Heart disease Father   . Breast cancer Maternal Aunt   . Heart disease Paternal Uncle   . Breast cancer Maternal Aunt    Socially she is married for 42 years. She has 3 children and 2 grandchildren. She completed college. She does drink occasional wine. There is no tobacco history. She does walk occasionally.  ROS General: Negative; No fevers, chills, or night sweats; positive for chronic fatigue HEENT: Negative; No changes in vision or hearing, sinus congestion, difficulty swallowing Pulmonary: Negative; No cough, wheezing, shortness of breath, hemoptysis Cardiovascular: Negative; No chest pain, presyncope, syncope, palpitations GI: Nausea for irritable bowel syndrome No nausea, vomiting, diarrhea, or abdominal pain GU: Negative; No dysuria, hematuria, or difficulty voiding Musculoskeletal: Negative; no myalgias, joint pain, or weakness Otologic: Reported history of lupus and rheumatoid disease. Hematologic/Oncology: Negative; no easy bruising, bleeding Endocrine: Negative; no heat/cold intolerance; no diabetes Neuro: Negative; no changes in balance, headaches Skin: Negative; No rashes or skin lesions Psychiatric: Negative; No behavioral problems, depression Sleep: Negative; No snoring, daytime sleepiness, hypersomnolence, bruxism, restless legs, hypnogognic hallucinations, no  cataplexy Other comprehensive 14 point system review is negative.   PE BP 100/60 mmHg  Pulse 74  Ht 5' 6.5" (1.689 m)  Wt 152 lb (68.947 kg)  BMI 24.17 kg/m2 General: Alert, oriented, no distress.  Skin: normal turgor, no rashes HEENT: Normocephalic, atraumatic. Pupils round and reactive; sclera anicteric; Fundi without hemorrhages or exudates. Nose without nasal septal hypertrophy Mouth/Parynx benign; Mallinpatti scale 2 Neck: No JVD, no carotid bruits with normal carotid upstroke Lungs: clear to ausculatation and percussion; no wheezing or rales Heart: RRR, s1 s2 normal faint 1/6 systolic murmur; no diastolic murmur; no rubs thrills or heaves No chest wall tenderness to palpation Abdomen: soft, nontender; no hepatosplenomehaly, BS+; abdominal aorta nontender and not dilated by palpation. Pulses 2+ Extremities: no clubbinbg  cyanosis or edema, Homan's sign negative  Neurologic: grossly nonfocal; no pathologic reflex Psychologic: Normal mood and affect  ECG (independently read by me): Sinus rhythm with sinus arrhythmia and PACs and transient atrial bigeminal like pattern  Prior November 2014  ECG: Sinus rhythm with PAC. QTc interval 442 ms. PR interval 200 ms  LABS:  BMET    Component Value Date/Time   NA 138 09/07/2010 2340   K 3.9 09/07/2010 2340   CL 106 09/07/2010 2340   CO2 22 09/07/2010 2340   GLUCOSE 159* 09/07/2010 2340   BUN 24* 09/07/2010 2340   CREATININE 0.78 09/07/2010 2340   CALCIUM 9.1 09/07/2010 2340   GFRNONAA >60 09/07/2010 2340   GFRAA  09/07/2010 2340    >60        The eGFR has been calculated using the MDRD equation. This calculation has not been validated in all clinical situations. eGFR's persistently <60 mL/min signify possible Chronic Kidney Disease.     Hepatic Function Panel     Component Value Date/Time   PROT 6.1 08/18/2008 0248   ALBUMIN 3.8 08/18/2008 0248   AST 49* 08/18/2008 0248   ALT 52* 08/18/2008 0248   ALKPHOS 82  08/18/2008 0248   BILITOT 0.8 08/18/2008 0248   BILIDIR 0.3 05/01/2008 0705   IBILI 1.1* 05/01/2008 0705     CBC    Component Value Date/Time   WBC 8.9 09/07/2010 2340   RBC 5.07 09/07/2010 2340   HGB 16.2* 09/07/2010 2340   HCT 46.5* 09/07/2010 2340   PLT 174 09/07/2010 2340   MCV 91.7 09/07/2010 2340   MCH 32.0 09/07/2010 2340   MCHC 34.8 09/07/2010 2340   RDW 12.9 09/07/2010 2340   LYMPHSABS 0.2* 09/07/2010 2340   MONOABS 0.4 09/07/2010 2340   EOSABS 0.0 09/07/2010 2340   BASOSABS 0.0 09/07/2010 2340     BNP No results found for: PROBNP  Lipid Panel     Component Value Date/Time   CHOL * 08/18/2008 0250    209        ATP III CLASSIFICATION:  <200     mg/dL   Desirable  200-239  mg/dL   Borderline High  >=240    mg/dL   High          TRIG 87 08/18/2008 0250   HDL 58 08/18/2008 0250   CHOLHDL 3.6 08/18/2008 0250   VLDL 17 08/18/2008 0250   LDLCALC * 08/18/2008 0250    134        Total Cholesterol/HDL:CHD Risk Coronary Heart Disease Risk Table                     Men   Women  1/2 Average Risk   3.4   3.3  Average Risk       5.0   4.4  2 X Average Risk   9.6   7.1  3 X Average Risk  23.4   11.0        Use the calculated Patient Ratio above and the CHD Risk Table to determine the patient's CHD Risk.        ATP III CLASSIFICATION (LDL):  <100     mg/dL   Optimal  100-129  mg/dL   Near or Above                    Optimal  130-159  mg/dL   Borderline  160-189  mg/dL   High  >190  mg/dL   Very High     RADIOLOGY: Dg Foot Complete Left  04/17/2013   Multiple view x-rays of left foot indicate no signs of stress fracture  with what appears to be arthritis at the fifth MPJ with narrowing of the  joint surface noted x-ray  Dg Foot Complete Right  04/17/2013   Multiple view x-rays of right foot indicate good alignment with no signs  of stress fracture or arthritis noted    ASSESSMENT AND PLAN:  Lori [name] is a 68 year old female who is on  multiple vitamins and additional supplemental medications prescribed by Dr. Gerarda Gunther at [place].  She has a history of palpitations.  Her ECG today reveals an atrial bigeminal like rhythm with ventricular rate at 74.  QTc interval is normal.  She is on Toprol-XL 25 mg daily.  She continues to note fatigue.  She has undergone numerous infusions by Dr. Franchot Erichsen dose.  She states that food often makes her sick.  She is lactose intolerant.  She is not significantly orthostatic, but a resting blood pressure is 113/65 supine and with standing.  This dropped to 106/65.  She is on thyroid replacement with nature thyroid 97.5 mg and her dose has recently been reduced.  In light of her sensation of palpitations and atrial bigeminy am suggesting slight additional titration of her metoprolol succinate to 37.5 mg daily and have suggested that she take 25 mg in the morning and 12.5 mg at bedtime.  We discussed the importance of increased exercise.  She is unaware of any sleep-disordered breathing.  She has irritable bowel syndrome.  She's not having any reflux on her current omeprazole.  She has a history of mixed connective tissue disorder.  She will follow-up with Dr. Brigitte Pulse.  I will see her one year for cardiology reevaluation or sooner if problems arise.  Time spent: 30 minutes   Troy Sine, MD, [facility] 04/11/2014 3:35 PM

## 2014-04-13 DIAGNOSIS — I498 Other specified cardiac arrhythmias: Secondary | ICD-10-CM | POA: Insufficient documentation

## 2014-05-02 ENCOUNTER — Ambulatory Visit: Payer: BC Managed Care – PPO

## 2014-05-08 ENCOUNTER — Ambulatory Visit
Admission: RE | Admit: 2014-05-08 | Discharge: 2014-05-08 | Disposition: A | Discharge status: Home / Self Care | Payer: BC Managed Care – PPO | Source: Ambulatory Visit

## 2014-05-08 DIAGNOSIS — Z1231 Encounter for screening mammogram for malignant neoplasm of breast: Secondary | ICD-10-CM

## 2014-05-10 ENCOUNTER — Ambulatory Visit (INDEPENDENT_AMBULATORY_CARE_PROVIDER_SITE_OTHER): Payer: BC Managed Care – PPO | Admitting: Gynecology

## 2014-05-10 ENCOUNTER — Encounter: Payer: Self-pay | Admitting: Gynecology

## 2014-05-10 VITALS — BP 126/80 | Ht 66.5 in | Wt 150.0 lb

## 2014-05-10 DIAGNOSIS — Z800 Family history of malignant neoplasm of digestive organs: Secondary | ICD-10-CM

## 2014-05-10 DIAGNOSIS — B3731 Acute candidiasis of vulva and vagina: Secondary | ICD-10-CM

## 2014-05-10 DIAGNOSIS — N898 Other specified noninflammatory disorders of vagina: Secondary | ICD-10-CM

## 2014-05-10 DIAGNOSIS — B373 Candidiasis of vulva and vagina: Secondary | ICD-10-CM

## 2014-05-10 DIAGNOSIS — M8580 Other specified disorders of bone density and structure, unspecified site: Secondary | ICD-10-CM

## 2014-05-10 LAB — WET PREP FOR TRICH, YEAST, CLUE
Clue Cells Wet Prep HPF POC: NONE SEEN
TRICH WET PREP: NONE SEEN

## 2014-05-10 MED ORDER — LIDOCAINE HCL 2 % EX GEL: 1.0000 "application " | CUTANEOUS | Status: DC | PRN

## 2014-05-10 MED ORDER — RISEDRONATE SODIUM 150 MG PO TABS: 150.0000 mg | ORAL_TABLET | ORAL | Status: DC

## 2014-05-10 NOTE — Patient Instructions (Addendum)
Osteoporosis Throughout your life, your body breaks down old bone and replaces it with new bone. As you get older, your body does not replace bone as quickly as it breaks it down. By the age of 30 years, most people begin to gradually lose bone because of the imbalance between bone loss and replacement. Some people lose more bone than others. Bone loss beyond a specified normal degree is considered osteoporosis.  Osteoporosis affects the strength and durability of your bones. The inside of the ends of your bones and your flat bones, like the bones of your pelvis, look like honeycomb, filled with tiny open spaces. As bone loss occurs, your bones become less dense. This means that the open spaces inside your bones become bigger and the walls between these spaces become thinner. This makes your bones weaker. Bones of a person with osteoporosis can become so weak that they can break (fracture) during minor accidents, such as a simple fall. CAUSES  The following factors have been associated with the development of osteoporosis:  Smoking.  Drinking more than 2 alcoholic drinks several days per week.  Long-term use of certain medicines:  Corticosteroids.  Chemotherapy medicines.  Thyroid medicines.  Antiepileptic medicines.  Gonadal hormone suppression medicine.  Immunosuppression medicine.  Being underweight.  Lack of physical activity.  Lack of exposure to the sun. This can lead to vitamin D deficiency.  Certain medical conditions:  Certain inflammatory bowel diseases, such as Crohn disease and ulcerative colitis.  Diabetes.  Hyperthyroidism.  Hyperparathyroidism. RISK FACTORS Anyone can develop osteoporosis. However, the following factors can increase your risk of developing osteoporosis:  Gender--Women are at higher risk than men.  Age--Being older than 50 years increases your risk.  Ethnicity--White and Asian people have an increased risk.  Weight --Being extremely  underweight can increase your risk of osteoporosis.  Family history of osteoporosis--Having a family member who has developed osteoporosis can increase your risk. SYMPTOMS  Usually, people with osteoporosis have no symptoms.  DIAGNOSIS  Signs during a physical exam that may prompt your caregiver to suspect osteoporosis include:  Decreased height. This is usually caused by the compression of the bones that form your spine (vertebrae) because they have weakened and become fractured.  A curving or rounding of the upper back (kyphosis). To confirm signs of osteoporosis, your caregiver may request a procedure that uses 2 low-dose X-ray beams with different levels of energy to measure your bone mineral density (dual-energy X-ray absorptiometry [DXA]). Also, your caregiver may check your level of vitamin D. TREATMENT  The goal of osteoporosis treatment is to strengthen bones in order to decrease the risk of bone fractures. There are different types of medicines available to help achieve this goal. Some of these medicines work by slowing the processes of bone loss. Some medicines work by increasing bone density. Treatment also involves making sure that your levels of calcium and vitamin D are adequate. PREVENTION  There are things you can do to help prevent osteoporosis. Adequate intake of calcium and vitamin D can help you achieve optimal bone mineral density. Regular exercise can also help, especially resistance and weight-bearing activities. If you smoke, quitting smoking is an important part of osteoporosis prevention. MAKE SURE YOU:  Understand these instructions.  Will watch your condition.  Will get help right away if you are not doing well or get worse. FOR MORE INFORMATION www.osteo.org and www.nof.org Document Released: 02/19/2005 Document Revised: 09/06/2012 Document Reviewed: 04/26/2011 [employer] Patient Information 2015 [employer]. This information is not   intended to replace advice  given to you by your health care provider. Make sure you discuss any questions you have with your health care provider.  Risedronate tablets What is this medicine? RISEDRONATE (ris ED roe nate) reduces calcium loss from bones. It helps make healthy bone and to slow bone loss in patients with Paget's disease and osteoporosis. It may be used in others at risk for bone loss. This medicine may be used for other purposes; ask your health care provider or pharmacist if you have questions. COMMON BRAND NAME(S): Actonel What should I tell my health care provider before I take this medicine? They need to know if you have any of these conditions: -dental disease -esophagus, stomach, or intestine problems, like acid reflux or GERD -kidney disease -low blood calcium -problems sitting or standing for 30 minutes -trouble swallowing -an unusual or allergic reaction to risedronate, other medicines, foods, dyes, or preservatives -pregnant or trying to get pregnant -breast-feeding How should I use this medicine? You must take this medication exactly as directed or you will lower the amount of medicine you absorb into your body or you may cause your self harm. Take this medicine by mouth first thing in the morning, after you are up for the day. Do not eat or drink anything before you take this medicine. Swallow the tablets with a full glass (6 to 8 fluid ounces) of plain water. Do not take the tablets with any other drink. Do not chew or crush the tablet. After taking this medicine, do not eat breakfast, drink, or take any other medicines or vitamins for at least 30 minutes. Stand or sit up for at least 30 minutes after you take this medicine; do not lie down. Do not take your medicine more often than directed. Talk to your pediatrician regarding the use of this medicine in children. Special care may be needed. Overdosage: If you think you have taken too much of this medicine contact a poison control center or  emergency room at once. NOTE: This medicine is only for you. Do not share this medicine with others. What if I miss a dose? If you miss a dose, do not take it later in the day. Take your normal dose the next morning. Do not take double or extra doses. What may interact with this medicine? -antacids like aluminum hydroxide or magnesium hydroxide -aspirin -calcium supplements -iron supplements -NSAIDs, medicines for pain and inflammation, like ibuprofen or naproxen -thyroid hormones -vitamins with minerals This list may not describe all possible interactions. Give your health care provider a list of all the medicines, herbs, non-prescription drugs, or dietary supplements you use. Also tell them if you smoke, drink alcohol, or use illegal drugs. Some items may interact with your medicine. What should I watch for while using this medicine? Visit your doctor or health care professional for regular check ups. It may be some time before you see the benefit from this medicine. Your doctor or health care professional may order blood tests and other tests to see how you are doing. You should make sure you get enough calcium and vitamin D while you are taking this medicine, unless your doctor tells you not to. Discuss the foods you eat and the vitamins you take with your health care professional. Some people who take this medicine have severe bone, joint, and/or muscle pain. This medicine may also increase your risk for a broken thigh bone. Tell your doctor right away if you have pain in your upper leg   groin. Tell your doctor if you have any pain that does not go away or that gets worse. What side effects may I notice from receiving this medicine? Side effects that you should report to your doctor as soon as possible: -allergic reactions such as skin rash or itching, hives, swelling of the face, lips, throat, or tongue -black or tarry stools -changes in vision -heartburn or stomach pain -jaw pain,  especially after dental work -pain or difficulty when swallowing -redness, blistering, peeling, or loosening of the skin, including inside the mouth Side effects that usually do not require medical attention (report to your doctor if they continue or are bothersome): -bone, muscle, or joint pain -changes in taste -diarrhea or constipation -eye pain or itching -headache -nausea or vomiting -stomach gas or fullness This list may not describe all possible side effects. Call your doctor for medical advice about side effects. You may report side effects to FDA at 1-800-FDA-1088. Where should I keep my medicine? Keep out of the reach of children. Store at room temperature between 20 and 25 degrees C (68 and 77 degrees F). Throw away any unused medicine after the expiration date. NOTE: This sheet is a summary. It may not cover all possible information. If you have questions about this medicine, talk to your doctor, pharmacist, or health care provider.  2015, Elsevier/Gold Standard. (2012-04-02 16:21:37)  Monilial Vaginitis Vaginitis in a soreness, swelling and redness (inflammation) of the vagina and vulva. Monilial vaginitis is not a sexually transmitted infection. CAUSES  Yeast vaginitis is caused by yeast (candida) that is normally found in your vagina. With a yeast infection, the candida has overgrown in number to a point that upsets the chemical balance. SYMPTOMS   White, thick vaginal discharge.  Swelling, itching, redness and irritation of the vagina and possibly the lips of the vagina (vulva).  Burning or painful urination.  Painful intercourse. DIAGNOSIS  Things that may contribute to monilial vaginitis are:  Postmenopausal and virginal states.  Pregnancy.  Infections.  Being tired, sick or stressed, especially if you had monilial vaginitis in the past.  Diabetes. Good control will help lower the chance.  Birth control pills.  Tight fitting garments.  Using bubble  bath, feminine sprays, douches or deodorant tampons.  Taking certain medications that kill germs (antibiotics).  Sporadic recurrence can occur if you become ill. TREATMENT  Your caregiver will give you medication.  There are several kinds of anti monilial vaginal creams and suppositories specific for monilial vaginitis. For recurrent yeast infections, use a suppository or cream in the vagina 2 times a week, or as directed.  Anti-monilial or steroid cream for the itching or irritation of the vulva may also be used. Get your caregiver's permission.  Painting the vagina with methylene blue solution may help if the monilial cream does not work.  Eating yogurt may help prevent monilial vaginitis. HOME CARE INSTRUCTIONS   Finish all medication as prescribed.  Do not have sex until treatment is completed or after your caregiver tells you it is okay.  Take warm sitz baths.  Do not douche.  Do not use tampons, especially scented ones.  Wear cotton underwear.  Avoid tight pants and panty hose.  Tell your sexual partner that you have a yeast infection. They should go to their caregiver if they have symptoms such as mild rash or itching.  Your sexual partner should be treated as well if your infection is difficult to eliminate.  Practice safer sex. Use condoms.  Some vaginal  medications cause latex condoms to fail. Vaginal medications that harm condoms are:  Cleocin cream.  Butoconazole (Femstat).  Terconazole (Terazol) vaginal suppository.  Miconazole (Monistat) (may be purchased over the counter). SEEK MEDICAL CARE IF:   You have a temperature by mouth above 102 F (38.9 C).  The infection is getting worse after 2 days of treatment.  The infection is not getting better after 3 days of treatment.  You develop blisters in or around your vagina.  You develop vaginal bleeding, and it is not your menstrual period.  You have pain when you urinate.  You develop  intestinal problems.  You have pain with sexual intercourse. Document Released: 02/19/2005 Document Revised: 08/04/2011 Document Reviewed: 11/03/2008 [facility] Patient Information 2015 [place], [place]. This information is not intended to replace advice given to you by your health care provider. Make sure you discuss any questions you have with your health care provider.

## 2014-05-10 NOTE — Progress Notes (Signed)
Lori [name] Jun 04, 1945 [id number]   History:    68 y.o. GYN exam and follow-up. Patient recently had seen her urologist who she sees periodically. Patient been complaining periurethral irritation and vulvar irritation. Patient recently had bladder sling revision in [facility]. Patient has had a history of laparoscopic BSO in the past benign pathology patient with no past history of abnormal Pap smears.Colonoscopy 2013 normal (father with history of colon cancer). Patient was seen in the office in November 2013 to discuss hormone replacement issues.She's also seen her primary care physician a Dr. Marton Redwood who has been doing her labs as well as her holistic medicine doctor Dr.Agustedes. They both had been drawing her labs and been treating her for her lupus as well as for her hypothyroidism. Her holistic medicine physician has had her on estrogen cream 0.125 daily along with Prometrium 200 mg daily. On the last visit we had discussed the ACOG guidelines as well as detailed discussion of the women's health initiative study indicating that women who need to be on hormone replacement therapy should be on a cyclical regimen and not continuous regimen of estrogen and progesterone to minimize the risk of breast cancer. Patient states that her holistic medicine provider decrease her Prometrium 200 mg daily. She reports no vaginal bleeding.  Patient had a bone density study here in our office early this year with no previous scan to compare with. Her lowest T score was at the right femoral neck with a value of -2.2. Frax analysis demonstrated that her 10 year risk of osteoporotic fracture was 20% right at the cut off range. And 2.6% risk of fracture of the hip.  Patient was screened for hepatitis C last year and was negative.  Past medical history,surgical history, family history and social history were all reviewed and documented in the EPIC chart.  Gynecologic History No LMP  recorded. Patient is postmenopausal. Contraception: post menopausal status Last Pap 2013 Results were: normal Last mammogram:  2015 sults were: done recently results pending   Obstetric History OB History  Gravida Para Term Preterm AB SAB TAB Ectopic Multiple Living  4 3 3  1 1    3     # Outcome Date GA Lbr Len/2nd Weight Sex Delivery Anes PTL Lv  4 SAB           3 Term     F Vag-Spont  N Y  2 Term     M Vag-Spont  N Y  1 Term     F Vag-Spont  N Y       ROS: A ROS was performed and pertinent positives and negatives are included in the history.  GENERAL: No fevers or chills. HEENT: No change in vision, no earache, sore throat or sinus congestion. NECK: No pain or stiffness. CARDIOVASCULAR: No chest pain or pressure. No palpitations. PULMONARY: No shortness of breath, cough or wheeze. GASTROINTESTINAL: No abdominal pain, nausea, vomiting or diarrhea, melena or bright red blood per rectum. GENITOURINARY: No urinary frequency, urgency, hesitancy or dysuria. MUSCULOSKELETAL: No joint or muscle pain, no back pain, no recent trauma. DERMATOLOGIC: No rash, no itching, no lesions. ENDOCRINE: No polyuria, polydipsia, no heat or cold intolerance. No recent change in weight. HEMATOLOGICAL: No anemia or easy bruising or bleeding. NEUROLOGIC: No headache, seizures, numbness, tingling or weakness. PSYCHIATRIC: No depression, no loss of interest in normal activity or change in sleep pattern.     Exam: chaperone present  BP 126/80 mmHg  Ht 5'  6.5" (1.689 m)  Wt 150 lb (68.04 kg)  BMI 23.85 kg/m2  Body mass index is 23.85 kg/(m^2).  General appearance : Well developed well nourished female. No acute distress HEENT: Neck supple, trachea midline, no carotid bruits, no thyroidmegaly Lungs: Clear to auscultation, no rhonchi or wheezes, or rib retractions  Heart: Regular rate and rhythm, no murmurs or gallops Breast:Examined in sitting and supine position were symmetrical in appearance, no palpable  masses or tenderness,  no skin retraction, no nipple inversion, no nipple discharge, no skin discoloration, no axillary or supraclavicular lymphadenopathy Abdomen: no palpable masses or tenderness, no rebound or guarding Extremities: no edema or skin discoloration or tenderness  Pelvic:  Bartholin, Urethra, Skene Glands: Within normal limits             Vagina: No gross lesions or discharge  Cervix: No gross lesions or discharge  Uterus  axialal size, shape and consistency, non-tender and mobile  Adnexa  Without masses or tenderness  Anus and perineum  normal   Rectovaginal  normal sphincter tone without palpated masse               Hemoccult: PCP provides                wet prep: Yeast moderate     Assessment/Plan:  68 y.o. female for GYN exam with history of recurrent yeast infections. She is going to be prescribed boric acid 600 mg suppository to apply twice a day for the next 3 months. She's also given a be prescribed 2% lidocaine to apply topically when necessary. She has a Diflucan tablet that she can take at home.  We discussed the findings of her osteopenia with a T score of the right femoral neck -2.2 but with increase fracture risk based on Frax analysis. She will have her calcium, vitamin D and PTH check today. We discussed treatment with anti-resorptive agents with risks benefits and pros and cons to discuss osteonecrosis of the jaw and spontaneous fracture of the hip. She will be prescribed Actonel 150 mg one by mouth every monthly. She suffers at at times from gastroesophageal reflux. If she cannot tolerate this monthly we will need to look at the possibility of yearly IV infusion such as with Reclast. She was reminded on the importance of calcium vitamin D and regular exercise for osteoporosis prevention. We discussed importance of monthly breast exam. Patient's PCP we'll be doing her blood work.   Terrance Mass MD, 12:52 PM 05/10/2014

## 2014-05-11 LAB — PTH, INTACT AND CALCIUM
Calcium: 10.2 mg/dL (ref 8.4–10.5)
PTH: 39 pg/mL (ref 14–64)

## 2014-05-11 LAB — VITAMIN D 25 HYDROXY (VIT D DEFICIENCY, FRACTURES): VIT D 25 HYDROXY: 52 ng/mL (ref 30–100)

## 2014-08-02 ENCOUNTER — Other Ambulatory Visit: Payer: Self-pay

## 2014-08-02 MED ORDER — NONFORMULARY OR COMPOUNDED ITEM: Status: DC

## 2014-08-02 NOTE — Telephone Encounter (Signed)
Called into pharmacy

## 2014-08-02 NOTE — Telephone Encounter (Signed)
Pharmacy called to question refill I sent over for this patient.   I took the Rx from your note from 05/10/14 "She is going to be prescribed boric acid 600 mg suppository to apply twice a day for the next 3 months."  You okayed the refill but they have called back stating that usually that is prescribed twice weekly not twice daily.  She asked me to check with you on it.

## 2014-08-03 MED ORDER — NONFORMULARY OR COMPOUNDED ITEM: Status: DC

## 2014-08-03 NOTE — Telephone Encounter (Signed)
Yes that  Is  correct twice a day for 2 weeks then twice a week to complete 3 months of treatment

## 2014-08-03 NOTE — Telephone Encounter (Signed)
Rx revised and new instructions called to [facility].

## 2014-08-10 ENCOUNTER — Encounter: Payer: Self-pay | Admitting: Hematology & Oncology

## 2014-08-15 ENCOUNTER — Telehealth: Payer: Self-pay | Admitting: Hematology & Oncology

## 2014-08-15 NOTE — Telephone Encounter (Signed)
Left vm w NEW PATIENT today to remind them of their appointment with Dr. Ennever. Also, advised them to bring all medication bottles and insurance card information. ° °

## 2014-08-16 ENCOUNTER — Encounter: Payer: Self-pay | Admitting: Family

## 2014-08-16 ENCOUNTER — Ambulatory Visit (HOSPITAL_BASED_OUTPATIENT_CLINIC_OR_DEPARTMENT_OTHER): Payer: BLUE CROSS/BLUE SHIELD | Admitting: Family

## 2014-08-16 ENCOUNTER — Other Ambulatory Visit: Payer: Self-pay | Admitting: Family

## 2014-08-16 ENCOUNTER — Ambulatory Visit (HOSPITAL_BASED_OUTPATIENT_CLINIC_OR_DEPARTMENT_OTHER): Payer: BLUE CROSS/BLUE SHIELD

## 2014-08-16 ENCOUNTER — Ambulatory Visit: Payer: BLUE CROSS/BLUE SHIELD

## 2014-08-16 ENCOUNTER — Telehealth: Payer: Self-pay | Admitting: Hematology & Oncology

## 2014-08-16 ENCOUNTER — Other Ambulatory Visit (HOSPITAL_BASED_OUTPATIENT_CLINIC_OR_DEPARTMENT_OTHER): Payer: BLUE CROSS/BLUE SHIELD | Admitting: Lab

## 2014-08-16 VITALS — BP 104/47 | HR 100

## 2014-08-16 DIAGNOSIS — R7989 Other specified abnormal findings of blood chemistry: Secondary | ICD-10-CM

## 2014-08-16 LAB — CBC WITH DIFFERENTIAL (CANCER CENTER ONLY)
BASO#: 0 10*3/uL (ref 0.0–0.2)
BASO%: 0.4 % (ref 0.0–2.0)
EOS%: 1.9 % (ref 0.0–7.0)
Eosinophils Absolute: 0.1 10*3/uL (ref 0.0–0.5)
HEMATOCRIT: 44.6 % (ref 34.8–46.6)
HGB: 15.2 g/dL (ref 11.6–15.9)
LYMPH#: 2.5 10*3/uL (ref 0.9–3.3)
LYMPH%: 43.6 % (ref 14.0–48.0)
MCH: 32 pg (ref 26.0–34.0)
MCHC: 34.1 g/dL (ref 32.0–36.0)
MCV: 94 fL (ref 81–101)
MONO#: 0.5 10*3/uL (ref 0.1–0.9)
MONO%: 9.1 % (ref 0.0–13.0)
NEUT#: 2.6 10*3/uL (ref 1.5–6.5)
NEUT%: 45 % (ref 39.6–80.0)
PLATELETS: 218 10*3/uL (ref 145–400)
RBC: 4.75 10*6/uL (ref 3.70–5.32)
RDW: 13.8 % (ref 11.1–15.7)
WBC: 5.7 10*3/uL (ref 3.9–10.0)

## 2014-08-16 LAB — FERRITIN CHCC: Ferritin: 70 ng/ml (ref 9–269)

## 2014-08-16 LAB — CMP (CANCER CENTER ONLY)
ALK PHOS: 71 U/L (ref 26–84)
ALT(SGPT): 49 U/L — ABNORMAL HIGH (ref 10–47)
AST: 45 U/L — ABNORMAL HIGH (ref 11–38)
Albumin: 4.2 g/dL (ref 3.3–5.5)
BILIRUBIN TOTAL: 0.8 mg/dL (ref 0.20–1.60)
BUN, Bld: 14 mg/dL (ref 7–22)
CO2: 25 mEq/L (ref 18–33)
CREATININE: 0.8 mg/dL (ref 0.6–1.2)
Calcium: 9.5 mg/dL (ref 8.0–10.3)
Chloride: 103 mEq/L (ref 98–108)
Glucose, Bld: 101 mg/dL (ref 73–118)
Potassium: 4.3 mEq/L (ref 3.3–4.7)
Sodium: 141 mEq/L (ref 128–145)
Total Protein: 7.5 g/dL (ref 6.4–8.1)

## 2014-08-16 LAB — IRON AND TIBC CHCC
%SAT: 85 % — ABNORMAL HIGH (ref 21–57)
IRON: 219 ug/dL — AB (ref 41–142)
TIBC: 256 ug/dL (ref 236–444)
UIBC: 38 ug/dL — ABNORMAL LOW (ref 120–384)

## 2014-08-16 NOTE — Patient Instructions (Signed)

## 2014-08-16 NOTE — Progress Notes (Signed)
Hematology/Oncology Consultation   Name: Lori [name]      MRN: [id number]    Location: Room/bed info not found  Date: 08/16/2014 Time:11:46 AM   REFERRING PHYSICIAN: Theodoro Kalata. Rhoton, MD   REASON FOR CONSULT: Hemochromatosis   DIAGNOSIS:  Hemochromatosis - heterozygous for the [id number] mutation   HISTORY OF PRESENT ILLNESS: Lori [name] is a pleasant 69 yo white female with a history of hemochromocytosis. She was diagnosed 3 years ago by Dr. Dorrene German and is heterozygous for the [id number]. She was being phlebotomized at [facility] and would now like to be somewhere closer to home. She is phlebotomized every couple of months as needed. Her last ferritin was 102 with the goal of less than 50.  She is not aware of anyone else in her family with hemochromatosis.  He daughter and mother both had anemia and varicose veins.  She has 3 adult children all of whom are healthy. She had one miscarriage. She had a partial hysterectomy in 2006.  She does not smoke or drink alcohol.  She has colonoscopies every 5 years. She states that these have all been normal and that she is due for another this year.  She states that she has her mammogram yearly and that they have been negative. She has frequent UTIs and and yeast and is treated with antibiotics.  She denies fever, cough, rash, dizziness, blurred vision, headaches, SOB, chest pain, abdominal pain, constipation, diarrhea, blood in urine or stool.  No swelling, tenderness, numbness or tingling in her extremities. No new aches or pains.  Her appetite is good and she is staying hydrated.    ROS: All other 10 point review of systems is negative.   PAST MEDICAL HISTORY:   Past Medical History  Diagnosis Date  . Lupus   . Hormone disorder   . Hypothyroidism   . Palpitations   . IBS (irritable bowel syndrome)   . Chronic fatigue     ALLERGIES: Allergies  Allergen Reactions  . Albumin (Human)   . Codeine   . Erythromycin   . Meperidine   .  Metoclopramide Hcl   . Other Other (See Comments)    Beef Chicken Dairy Eggs  . Shellfish-Derived Products       MEDICATIONS:  Current Outpatient Prescriptions on File Prior to Visit  Medication Sig Dispense Refill  . Ascorbic Acid (VITAMIN C PO) Take 3,000 mg by mouth daily.    . Cholecalciferol (VITAMIN D PO) Take 1 tablet by mouth daily.    . ciprofloxacin (CIPRO) 250 MG tablet Take 1 tablet by mouth at bedtime.    . clindamycin (CLEOCIN) 2 % vaginal cream Place 1 Applicatorful vaginally at bedtime. 40 g 0  . Coenzyme Q10 (CO Q 10 PO) Take 1 tablet by mouth daily.    Marland Kitchen dextroamphetamine (DEXTROSTAT) 5 MG tablet Take 5 mg by mouth daily.    . fish oil-omega-3 fatty acids 1000 MG capsule Take 1 g by mouth daily.    . fluconazole (DIFLUCAN) 100 MG tablet Take 1 tablet by mouth as needed.    . Glucosamine HCl (GLUCOSAMINE PO) Take 1 tablet by mouth 2 (two) times daily.    Marland Kitchen GLUTAMINE PO Take 1 tablet by mouth 2 (two) times daily.    Marland Kitchen lidocaine (XYLOCAINE) 2 % jelly Apply 1 application topically as needed. 30 mL 3  . LORazepam (ATIVAN) 0.5 MG tablet Take 1 tablet by mouth at bedtime.    Marland Kitchen LYSINE PO Take 1 tablet by mouth  3 (three) times a week.    Marland Kitchen MAGNESIUM PO Take 1 tablet by mouth daily.    Marland Kitchen MELATONIN PO Take 1 tablet by mouth at bedtime.    . metoprolol succinate (TOPROL XL) 25 MG 24 hr tablet Take 1 tablet in the morning and 1/2 tablet in the evening 45 tablet 11  . Multiple Vitamin (MULTIVITAMIN) tablet Take 1 tablet by mouth daily.    . NONFORMULARY OR COMPOUNDED ITEM Boric Acid 600 mg caps. S: insert vaginally twice daily x 2 weeks then twice weekly to complete 3 mos of treatment. 60 each 1  . omeprazole (PRILOSEC) 40 MG capsule Take 1 capsule by mouth at bedtime.    . potassium chloride SA (K-DUR,KLOR-CON) 20 MEQ tablet Take 1 tablet by mouth daily.    . Prasterone, DHEA, 10 MG CAPS Take 7.5 mg by mouth daily.    Marland Kitchen PRESCRIPTION MEDICATION BIEST 90/10 ESTROGEN CREAM 0.125 MG     . Probiotic Product (PROBIOTIC DAILY PO) Take 1 tablet by mouth 2 (two) times daily.    . progesterone (PROMETRIUM) 200 MG capsule Take 200 mg by mouth daily.    . risedronate (ACTONEL) 150 MG tablet Take 1 tablet (150 mg total) by mouth every 30 (thirty) days. with water on empty stomach, nothing by mouth or lie down for next 30 minutes. 1 tablet 11  . Simethicone 180 MG CAPS Take 1 capsule by mouth as needed.    . Thyroid (NATURE-THROID) 97.5 MG TABS Take 1 tablet by mouth daily.    . TRAVATAN Z 0.004 % SOLN ophthalmic solution Place 1 drop into both eyes at bedtime.  2   No current facility-administered medications on file prior to visit.     PAST SURGICAL HISTORY Past Surgical History  Procedure Laterality Date  . Breast surgery  1980    LUMPECTOMY X 2 FROM RIGHT BREAST  . Tubal ligation    . Abdominal surgery      ABDOMINOPLASTY  . Laparoscopic cholecystectomy    . Tonsillectomy and adenoidectomy    . Eye surgery    . Bladder suspension    . Carotid doppler  08/28/2011    Normal, no evidence of significant diameter reduction, dissection, or vascular abnormality  . Cardiovascular stress test  08/28/2011    Normal  . Transthoracic echocardiogram  01/29/2011    EF >55%, mild concentric LVH    FAMILY HISTORY: Family History  Problem Relation Age of Onset  . Seizures Mother   . Transient ischemic attack Mother   . Cancer Father 52    COLON  . Diabetes Father   . Heart disease Father   . Breast cancer Maternal Aunt   . Heart disease Paternal Uncle   . Breast cancer Maternal Aunt     SOCIAL HISTORY:  reports that she has never smoked. She has never used smokeless tobacco. She reports that she drinks alcohol. She reports that she does not use illicit drugs.  PERFORMANCE STATUS: The patient's performance status is 0 - Asymptomatic  PHYSICAL EXAM: Most Recent Vital Signs: There were no vitals taken for this visit. BP 111/50 mmHg  Pulse 43  Temp(Src) 97.5 F (36.4 C)  (Oral)  Resp 16  Ht 5\' 6"  (1.676 m)  Wt 148 lb (67.132 kg)  BMI 23.90 kg/m2  General Appearance:    Alert, cooperative, no distress, appears stated age  Head:    Normocephalic, without obvious abnormality, atraumatic  Eyes:    PERRL, conjunctiva/corneas clear, EOM's intact,  fundi    benign, both eyes        Throat:   Lips, mucosa, and tongue normal; teeth and gums normal  Neck:   Supple, symmetrical, trachea midline, no adenopathy;    thyroid:  no enlargement/tenderness/nodules; no carotid   bruit or JVD  Back:     Symmetric, no curvature, ROM normal, no CVA tenderness  Lungs:     Clear to auscultation bilaterally, respirations unlabored  Chest Wall:    No tenderness or deformity   Heart:    Regular rate and rhythm, S1 and S2 normal, no murmur, rub   or gallop     Abdomen:     Soft, non-tender, bowel sounds active all four quadrants,    no masses, no organomegaly        Extremities:   Extremities normal, atraumatic, no cyanosis or edema  Pulses:   2+ and symmetric all extremities  Skin:   Skin color, texture, turgor normal, no rashes or lesions  Lymph nodes:   Cervical, supraclavicular, and axillary nodes normal  Neurologic:   CNII-XII intact, normal strength, sensation and reflexes    throughout   LABORATORY DATA:  Results for orders placed or performed in visit on 08/16/14 (from the past 48 hour(s))  CBC with Differential [facility])     Status: None   Collection Time: 08/16/14 11:27 AM  Result Value Ref Range   WBC 5.7 3.9 - 10.0 10e3/uL   RBC 4.75 3.70 - 5.32 10e6/uL   HGB 15.2 11.6 - 15.9 g/dL   HCT 44.6 34.8 - 46.6 %   MCV 94 81 - 101 fL   MCH 32.0 26.0 - 34.0 pg   MCHC 34.1 32.0 - 36.0 g/dL   RDW 13.8 11.1 - 15.7 %   Platelets 218 145 - 400 10e3/uL   NEUT# 2.6 1.5 - 6.5 10e3/uL   LYMPH# 2.5 0.9 - 3.3 10e3/uL   MONO# 0.5 0.1 - 0.9 10e3/uL   Eosinophils Absolute 0.1 0.0 - 0.5 10e3/uL   BASO# 0.0 0.0 - 0.2 10e3/uL   NEUT% 45.0 39.6 - 80.0 %   LYMPH% 43.6  14.0 - 48.0 %   MONO% 9.1 0.0 - 13.0 %   EOS% 1.9 0.0 - 7.0 %   BASO% 0.4 0.0 - 2.0 %     RADIOGRAPHY: No results found.     PATHOLOGY: None  ASSESSMENT/PLAN: Lori [name] is a pleasant 69 yo white female with a history of hemochromocytosis. She was diagnosed 3 years ago by Dr. Dorrene German and is heterozygous for the Cys282Tyr. She would like to be phlebotomized closer to home. We can certainly help her with this. She is asymptomatic at this time. Her ferritin is 70.  We will go ahead and phlebotomize her today.  We will see her back in 2 months for labs, follow-up and phlebotomy.  All questions were answered. The patient knows to call the clinic with any problems, questions or concerns. We can certainly see the patient much sooner if necessary.  The patient was discussed with and also seen by Dr. Marin Olp and he is in agreement with the aforementioned.   [facility] M    Addendum:  I saw and examined the patient with Sanjay Broadfoot. She does have hemochromatosis. She does have other issues however they're being managed by her family doctor.  We will continue her phlebotomy program. I had no problems keeping her ferritin below 50.  She did have a recent CT scan done. The liver looked okay. There  is no evidence of cirrhosis. I might sure what she means by "varicose veins" in her abdomen. I will think that this would indicate varices but she does not have any by the CT scan.  We will phlebotomize her. We'll then plan to get her back depending on what her iron levels show.  She is very nice. She has a strong faith.  Pete E.

## 2014-08-16 NOTE — Progress Notes (Signed)
Lori [name] presents today for phlebotomy per MD orders. Phlebotomy procedure started at 1250 and ended at 1300. 500 grams removed. Patient observed for 30 minutes after procedure without any incident. Patient tolerated procedure well. IV needle removed intact.

## 2014-08-16 NOTE — Telephone Encounter (Signed)
Mailed may schedule

## 2014-08-20 LAB — HEMOCHROMATOSIS DNA-PCR(C282Y,H63D)

## 2014-08-22 ENCOUNTER — Encounter: Payer: Self-pay | Admitting: Hematology & Oncology

## 2014-08-24 ENCOUNTER — Other Ambulatory Visit: Payer: Self-pay | Admitting: Family

## 2014-08-25 ENCOUNTER — Other Ambulatory Visit: Payer: Self-pay | Admitting: Family Medicine

## 2014-08-25 DIAGNOSIS — R945 Abnormal results of liver function studies: Secondary | ICD-10-CM

## 2014-08-29 ENCOUNTER — Encounter: Payer: Self-pay | Admitting: Women's Health

## 2014-08-29 ENCOUNTER — Ambulatory Visit (INDEPENDENT_AMBULATORY_CARE_PROVIDER_SITE_OTHER): Payer: BLUE CROSS/BLUE SHIELD | Admitting: Women's Health

## 2014-08-29 DIAGNOSIS — R21 Rash and other nonspecific skin eruption: Secondary | ICD-10-CM | POA: Diagnosis not present

## 2014-08-29 NOTE — Progress Notes (Signed)
Patient ID: Lori [name], female   DOB: [birth year], 69 y.o.   MRN: [id number] Presents with complaint of rash on right inner groin, for past week, states it is beginning to resolve. Painful bumps with minimal itching. Denies abdominal pain, fever, vaginal discharge, odor, or  itching. Has had long-term problems with chronic UTIs currently on Septra  per primary care. Reports grandson has strep infection on buttocks. Postmenopausal on compounded HRT with no bleeding.  Exam: Appears well. External genitalia within normal limits, right inner groin approximately 8-10 pustules, minimal erythema, no odor noted. HSV culture and wound culture taken.   Right groin pustules  Plan: Await culture results. Reviewed importance of avoiding further irritation, loose clothing, over-the-counter Neosporin to open areas.

## 2014-08-31 LAB — HERPES SIMPLEX VIRUS CULTURE: ORGANISM ID, BACTERIA: DETECTED

## 2014-09-01 ENCOUNTER — Other Ambulatory Visit: Payer: Self-pay | Admitting: Women's Health

## 2014-09-01 DIAGNOSIS — B009 Herpesviral infection, unspecified: Secondary | ICD-10-CM | POA: Insufficient documentation

## 2014-09-01 LAB — WOUND CULTURE
GRAM STAIN: NONE SEEN
Organism ID, Bacteria: NO GROWTH

## 2014-09-01 MED ORDER — VALACYCLOVIR HCL 500 MG PO TABS: ORAL_TABLET | ORAL | Status: DC

## 2014-09-06 ENCOUNTER — Ambulatory Visit
Admission: RE | Admit: 2014-09-06 | Discharge: 2014-09-06 | Disposition: A | Discharge status: Home / Self Care | Payer: BLUE CROSS/BLUE SHIELD | Source: Ambulatory Visit | Attending: Family Medicine | Admitting: Family Medicine

## 2014-09-06 DIAGNOSIS — R945 Abnormal results of liver function studies: Secondary | ICD-10-CM

## 2014-09-21 ENCOUNTER — Telehealth: Payer: Self-pay | Admitting: Hematology & Oncology

## 2014-09-21 NOTE — Telephone Encounter (Signed)
Pt called wanted to talk to MD, I transferred call to RN voice mail, pt did not seem to like that. I also gave message to MD to call pt. She has questions about chemo her son is getting.

## 2014-10-18 ENCOUNTER — Ambulatory Visit: Payer: BLUE CROSS/BLUE SHIELD | Admitting: Family

## 2014-10-18 ENCOUNTER — Other Ambulatory Visit (HOSPITAL_BASED_OUTPATIENT_CLINIC_OR_DEPARTMENT_OTHER): Payer: BLUE CROSS/BLUE SHIELD

## 2014-10-18 LAB — COMPREHENSIVE METABOLIC PANEL
ALT: 24 U/L (ref 0–35)
AST: 25 U/L (ref 0–37)
Albumin: 4.2 g/dL (ref 3.5–5.2)
Alkaline Phosphatase: 70 U/L (ref 39–117)
BUN: 19 mg/dL (ref 6–23)
CALCIUM: 9.8 mg/dL (ref 8.4–10.5)
CHLORIDE: 103 meq/L (ref 96–112)
CO2: 26 meq/L (ref 19–32)
CREATININE: 0.64 mg/dL (ref 0.50–1.10)
Glucose, Bld: 94 mg/dL (ref 70–99)
Potassium: 4 mEq/L (ref 3.5–5.3)
Sodium: 139 mEq/L (ref 135–145)
Total Bilirubin: 0.3 mg/dL (ref 0.2–1.2)
Total Protein: 6.9 g/dL (ref 6.0–8.3)

## 2014-10-18 LAB — CBC WITH DIFFERENTIAL (CANCER CENTER ONLY)
BASO#: 0 10*3/uL (ref 0.0–0.2)
BASO%: 0.4 % (ref 0.0–2.0)
EOS%: 1.6 % (ref 0.0–7.0)
Eosinophils Absolute: 0.1 10*3/uL (ref 0.0–0.5)
HCT: 42 % (ref 34.8–46.6)
HGB: 14.8 g/dL (ref 11.6–15.9)
LYMPH#: 2.8 10*3/uL (ref 0.9–3.3)
LYMPH%: 41.1 % (ref 14.0–48.0)
MCH: 33.1 pg (ref 26.0–34.0)
MCHC: 35.2 g/dL (ref 32.0–36.0)
MCV: 94 fL (ref 81–101)
MONO#: 0.6 10*3/uL (ref 0.1–0.9)
MONO%: 9.2 % (ref 0.0–13.0)
NEUT#: 3.3 10*3/uL (ref 1.5–6.5)
NEUT%: 47.7 % (ref 39.6–80.0)
Platelets: 218 10*3/uL (ref 145–400)
RBC: 4.47 10*6/uL (ref 3.70–5.32)
RDW: 12.5 % (ref 11.1–15.7)
WBC: 6.9 10*3/uL (ref 3.9–10.0)

## 2014-10-19 LAB — IRON AND TIBC CHCC
%SAT: 64 % — ABNORMAL HIGH (ref 21–57)
Iron: 145 ug/dL — ABNORMAL HIGH (ref 41–142)
TIBC: 226 ug/dL — AB (ref 236–444)
UIBC: 81 ug/dL — AB (ref 120–384)

## 2014-10-19 LAB — FERRITIN CHCC: FERRITIN: 29 ng/mL (ref 9–269)

## 2014-11-20 ENCOUNTER — Other Ambulatory Visit: Payer: Self-pay

## 2015-04-03 ENCOUNTER — Other Ambulatory Visit: Payer: Self-pay

## 2015-04-03 DIAGNOSIS — Z1231 Encounter for screening mammogram for malignant neoplasm of breast: Secondary | ICD-10-CM

## 2015-04-10 ENCOUNTER — Encounter: Payer: Self-pay | Admitting: Cardiovascular Disease

## 2015-04-11 ENCOUNTER — Ambulatory Visit (INDEPENDENT_AMBULATORY_CARE_PROVIDER_SITE_OTHER): Payer: BLUE CROSS/BLUE SHIELD | Admitting: Cardiovascular Disease

## 2015-04-11 VITALS — BP 110/66 | HR 65 | Ht 67.5 in | Wt 148.6 lb

## 2015-04-11 DIAGNOSIS — R5382 Chronic fatigue, unspecified: Secondary | ICD-10-CM

## 2015-04-11 DIAGNOSIS — I499 Cardiac arrhythmia, unspecified: Secondary | ICD-10-CM

## 2015-04-11 DIAGNOSIS — I498 Other specified cardiac arrhythmias: Secondary | ICD-10-CM

## 2015-04-11 DIAGNOSIS — M359 Systemic involvement of connective tissue, unspecified: Secondary | ICD-10-CM | POA: Diagnosis not present

## 2015-04-11 DIAGNOSIS — R002 Palpitations: Secondary | ICD-10-CM | POA: Diagnosis not present

## 2015-04-11 NOTE — Patient Instructions (Signed)
Your physician wants you to follow-up in: 1 year or sooner if needed. You will receive a reminder letter in the mail two months in advance. If you don't receive a letter, please call our office to schedule the follow-up appointment.  Your physician has recommended you make the following change in your medication: take 1.5 tablets of the metoprolol at night.  If you need a refill on your cardiac medications before your next appointment, please call your pharmacy.

## 2015-04-13 ENCOUNTER — Encounter: Payer: Self-pay | Admitting: Cardiovascular Disease

## 2015-04-13 NOTE — Progress Notes (Signed)
Patient ID: Lori [name], female   DOB: 05-15-1946, 69 y.o.   MRN: [id number]     HPI: Lori [name] is a former patient of Lori [name].  She presented for one-year follow-up evaluation.  Lori [name] has a history of episodic palpitations in the past has been demonstrated to have a PACs with short runs of atrial tachycardia. She  has a history of irritable bowel syndrome, chronic fatigue, hypothyroidism.  She has had issues with bladder infections, and underwent bladder tacking surgery in July. She sees Lori [name] of urology in [facility].  In 2012 an echo Doppler study showed mild concentric left ventricular hypertrophy with normal systolic and diastolic function. There was trivial mitral and tricuspid regurgitation. She previously has had a normal Myoview scan in 2013 she also had carotid studies which essentially were normal.   She sees [name] frequently and is on numerous vitamin and supplement medications.She does take numerous herbal type medications as well as vitamins.  She states she was told of having possible low adrenal function.  She denies any recent episodes of chest pain. There are no episodes of presyncope or syncope. .  Over the past year, she admits to occasional palpitations.  She also feels that her rheumatoid arthritis may be acting up.  Her rheumatologist had retired.  Lori [name] had been in [name].  She has been under some increased stress with a son-in-law who has brain cancer and is under evaluation at [name] and also other son-in-law who has a heroin problem.  She denies chest pain.  She denies presyncope or syncope.  She has fatigue and has been taking Dexedrine 5 mg.  She presents for evaluation.   Past Medical History  Diagnosis Date  . Lupus ([place])   . Hormone disorder   . Hypothyroidism   . Palpitations   . IBS (irritable bowel syndrome)   . Chronic fatigue     Past Surgical History  Procedure Laterality Date    . Breast surgery  1980    LUMPECTOMY X 2 FROM RIGHT BREAST  . Tubal ligation    . Abdominal surgery      ABDOMINOPLASTY  . Laparoscopic cholecystectomy    . Tonsillectomy and adenoidectomy    . Eye surgery    . Bladder suspension    . Carotid doppler  08/28/2011    Normal, no evidence of significant diameter reduction, dissection, or vascular abnormality  . Cardiovascular stress test  08/28/2011    Normal  . Transthoracic echocardiogram  01/29/2011    EF >55%, mild concentric LVH    Allergies  Allergen Reactions  . Albumin (Human)   . Codeine   . Erythromycin   . Meperidine   . Metoclopramide Hcl   . Other Other (See Comments)    Beef Chicken Dairy Eggs  . Shellfish-Derived Products     Current Outpatient Prescriptions  Medication Sig Dispense Refill  . benzonatate (TESSALON) 100 MG capsule   2  . BORON PO Take by mouth.    . Cholecalciferol (VITAMIN D PO) Take 1 tablet by mouth daily.    . Coenzyme Q10 (CO Q 10 PO) Take 1 tablet by mouth daily.    Marland Kitchen dextroamphetamine (DEXEDRINE SPANSULE) 5 MG 24 hr capsule Take 5 mg by mouth daily.  0  . dextroamphetamine (DEXTROSTAT) 5 MG tablet Take 5 mg by mouth daily.    Marland Kitchen docusate sodium (COLACE) 100 MG capsule Take 100 mg by mouth 2 (two)  times daily.    . fexofenadine (ALLEGRA) 180 MG tablet Take 180 mg by mouth daily.    . fish oil-omega-3 fatty acids 1000 MG capsule Take 1 g by mouth daily.    . fluconazole (DIFLUCAN) 100 MG tablet Take 1 tablet by mouth as needed.    . Glucosamine HCl (GLUCOSAMINE PO) Take 1 tablet by mouth 2 (two) times daily.    Marland Kitchen GLUTAMINE PO Take 1 tablet by mouth 2 (two) times daily.    Marland Kitchen LYSINE PO Take 1 tablet by mouth 3 (three) times a week.    Marland Kitchen MAGNESIUM PO Take 1 tablet by mouth daily.    Marland Kitchen MELATONIN PO Take 1 tablet by mouth at bedtime.    . metoprolol succinate (TOPROL XL) 25 MG 24 hr tablet Take 1 tablet in the morning and 1/2 tablet in the evening 45 tablet 11  . Multiple Vitamin (MULTIVITAMIN)  tablet Take 1 tablet by mouth daily.    Marland Kitchen NATURE-THROID 113.75 MG TABS     . NONFORMULARY OR COMPOUNDED ITEM Boric Acid 600 mg caps. S: insert vaginally twice daily x 2 weeks then twice weekly to complete 3 mos of treatment. 60 each 1  . potassium chloride SA (K-DUR,KLOR-CON) 20 MEQ tablet Take 1 tablet by mouth daily.    . Prasterone, DHEA, 10 MG CAPS Take 7.5 mg by mouth daily.    . Pregnenolone POWD Take 1 capsule by mouth at bedtime.    Marland Kitchen PRESCRIPTION MEDICATION BIEST 90/10 ESTROGEN CREAM 0.125 MG    . Probiotic Product (PROBIOTIC DAILY PO) Take 1 tablet by mouth 2 (two) times daily.    . progesterone (PROMETRIUM) 200 MG capsule Take 200 mg by mouth daily.    . Simethicone 180 MG CAPS Take 1 capsule by mouth as needed.    . Thyroid (NATURE-THROID) 97.5 MG TABS Take 1 tablet by mouth daily.     No current facility-administered medications for this visit.    Socially History is notable in that she is married for 42 years. She has 3 children and 2 grandchildren. She completed college. She does drink occasional wine. There is no tobacco history. She does walk occasionally.   Family History  Problem Relation Age of Onset  . Seizures Mother   . Transient ischemic attack Mother   . Cancer Father 38    COLON  . Diabetes Father   . Heart disease Father   . Breast cancer Maternal Aunt   . Heart disease Paternal Uncle   . Breast cancer Maternal Aunt     ROS General: Negative; No fevers, chills, or night sweats; positive for chronic fatigue HEENT: Negative; No changes in vision or hearing, sinus congestion, difficulty swallowing Pulmonary: Negative; No cough, wheezing, shortness of breath, hemoptysis Cardiovascular: Negative; No chest pain, presyncope, syncope, palpitations GI: Nausea for irritable bowel syndrome No nausea, vomiting, diarrhea, or abdominal pain GU: Negative; No dysuria, hematuria, or difficulty voiding Musculoskeletal: Negative; no myalgias, joint pain, or  weakness Rheumatologic: Reported history of lupus and rheumatoid disease. Hematologic/Oncology: Negative; no easy bruising, bleeding Endocrine: Negative; no heat/cold intolerance; no diabetes Neuro: Negative; no changes in balance, headaches Skin: Negative; No rashes or skin lesions Psychiatric: Negative; No behavioral problems, depression Sleep: Negative; No snoring, daytime sleepiness, hypersomnolence, bruxism, restless legs, hypnogognic hallucinations, no cataplexy Other comprehensive 14 point system review is negative.   PE BP 110/66 mmHg  Pulse 65  Ht 5' 7.5" (1.715 m)  Wt 148 lb 9.6 oz (67.405 kg)  BMI  22.92 kg/m2   Wt Readings from Last 3 Encounters:  04/11/15 148 lb 9.6 oz (67.405 kg)  08/16/14 148 lb (67.132 kg)  05/10/14 150 lb (68.04 kg)   General: Alert, oriented, no distress.  Skin: normal turgor, no rashes HEENT: Normocephalic, atraumatic. Pupils round and reactive; sclera anicteric; Fundi without hemorrhages or exudates. Nose without nasal septal hypertrophy Mouth/Parynx benign; Mallinpatti scale 2 Neck: No JVD, no carotid bruits with normal carotid upstroke Lungs: clear to ausculatation and percussion; no wheezing or rales Heart: RRR, s1 s2 normal faint 1/6 systolic murmur; no diastolic murmur; no rubs thrills or heaves No chest wall tenderness to palpation Abdomen: soft, nontender; no hepatosplenomehaly, BS+; abdominal aorta nontender and not dilated by palpation. Pulses 2+ Extremities: Mild thickening of the second and third MCP of both hands; no clubbing cyanosis or edema, Homan's sign negative  Neurologic: grossly nonfocal; no pathologic reflex Psychologic: Normal mood and affect  ECG (independently read by me): ECG reveals sinus rhythm with first-degree AV block with PR interval 216 only seconds. Occasional PACs.  Nonspecific ST changes.  November 2015 ECG (independently read by me): Sinus rhythm with sinus arrhythmia and PACs and transient atrial  bigeminal like pattern  Prior November 2014  ECG: Sinus rhythm with PAC. QTc interval 442 ms. PR interval 200 ms  LABS: BMP Latest Ref Rng 10/18/2014 08/16/2014 05/10/2014  Glucose 70 - 99 mg/dL 94 101 -  BUN 6 - 23 mg/dL 19 14 -  Creatinine 0.50 - 1.10 mg/dL 0.64 0.8 -  Sodium 135 - 145 mEq/L 139 141 -  Potassium 3.5 - 5.3 mEq/L 4.0 4.3 -  Chloride 96 - 112 mEq/L 103 103 -  CO2 19 - 32 mEq/L 26 25 -  Calcium 8.4 - 10.5 mg/dL 9.8 9.5 10.2   Hepatic Function Latest Ref Rng 10/18/2014 08/16/2014 08/18/2008  Total Protein 6.0 - 8.3 g/dL 6.9 7.5 6.1  Albumin 3.5 - 5.2 g/dL 4.2 4.2 3.8  AST 0 - 37 U/L 25 45(H) 49(H)  ALT 0 - 35 U/L 24 49(H) 52(H)  Alk Phosphatase 39 - 117 U/L 70 71 82  Total Bilirubin 0.2 - 1.2 mg/dL 0.3 0.80 0.8  Bilirubin, Direct 0.0 - 0.3 mg/dL - - -   CBC Latest Ref Rng 10/18/2014 08/16/2014 09/07/2010  WBC 3.9 - 10.0 10e3/uL 6.9 5.7 8.9  Hemoglobin 11.6 - 15.9 g/dL 14.8 15.2 16.2(H)  Hematocrit 34.8 - 46.6 % 42.0 44.6 46.5(H)  Platelets 145 - 400 10e3/uL 218 218 174   Lab Results  Component Value Date   MCV 94 10/18/2014   MCV 94 08/16/2014   MCV 91.7 09/07/2010   No results found for: TSH   No results found for: HGBA1C   Lipid Panel     Component Value Date/Time   CHOL * 08/18/2008 0250    209        ATP III CLASSIFICATION:  <200     mg/dL   Desirable  200-239  mg/dL   Borderline High  >=240    mg/dL   High          TRIG 87 08/18/2008 0250   HDL 58 08/18/2008 0250   CHOLHDL 3.6 08/18/2008 0250   VLDL 17 08/18/2008 0250   LDLCALC * 08/18/2008 0250    134        Total Cholesterol/HDL:CHD Risk Coronary Heart Disease Risk Table                     Men  Women  1/2 Average Risk   3.4   3.3  Average Risk       5.0   4.4  2 X Average Risk   9.6   7.1  3 X Average Risk  23.4   11.0        Use the calculated Patient Ratio above and the CHD Risk Table to determine the patient's CHD Risk.        ATP III CLASSIFICATION (LDL):  <100     mg/dL    Optimal  100-129  mg/dL   Near or Above                    Optimal  130-159  mg/dL   Borderline  160-189  mg/dL   High  >190     mg/dL   Very High     RADIOLOGY: Dg Foot Complete Left  04/17/2013   Multiple view x-rays of left foot indicate no signs of stress fracture  with what appears to be arthritis at the fifth MPJ with narrowing of the  joint surface noted x-ray  Dg Foot Complete Right  04/17/2013   Multiple view x-rays of right foot indicate good alignment with no signs  of stress fracture or arthritis noted    ASSESSMENT AND PLAN: Ms. Shevon Sian is a 69 year old female who is on multiple vitamins and additional supplemental medications prescribed by Dr. Gerarda Gunther at [place].  She has a history of palpitations and has been taking Toprol-XL 25 mg at bedtime.  She continues to have occasional episodes of palpitations and on her ECG continues to show occasional atrial premature complexes.  She has been taking Dexedrine which his dextroamphetamine which undoubtedly may be contributing to her ectopy.  We discussed potential discontinuance of this, but she states she is taking this for a long time due to chronic fatigue.  I am recommending further titration of her Toprol-XL to 37.5 mg at bedtime.  She is also concerned that she may potentially be having a flareup of some rheumatoid arthritis.  She did have some mild thickening of her MCP joints s in the second and third fingers of both hands.  Her rheumatologist has retired but I gave her the name of 2 rheumatologists who have left [facility] medical to form [facility] rheumatology.    Time spent: 30 minutes   Troy Sine, MD, [facility] 04/13/2015 7:32 PM

## 2015-04-16 ENCOUNTER — Encounter (HOSPITAL_COMMUNITY): Payer: Self-pay | Admitting: Emergency Medicine

## 2015-04-16 ENCOUNTER — Emergency Department (HOSPITAL_COMMUNITY)
Admission: EM | Admit: 2015-04-16 | Discharge: 2015-04-17 | Disposition: A | Discharge status: Home / Self Care | Payer: BLUE CROSS/BLUE SHIELD | Attending: Emergency Medicine | Admitting: Emergency Medicine

## 2015-04-16 DIAGNOSIS — Z79899 Other long term (current) drug therapy: Secondary | ICD-10-CM | POA: Diagnosis not present

## 2015-04-16 DIAGNOSIS — M545 Low back pain: Secondary | ICD-10-CM | POA: Diagnosis present

## 2015-04-16 DIAGNOSIS — E039 Hypothyroidism, unspecified: Secondary | ICD-10-CM | POA: Insufficient documentation

## 2015-04-16 DIAGNOSIS — N23 Unspecified renal colic: Secondary | ICD-10-CM | POA: Diagnosis not present

## 2015-04-16 LAB — COMPREHENSIVE METABOLIC PANEL
ALT: 34 U/L (ref 14–54)
AST: 38 U/L (ref 15–41)
Albumin: 3.9 g/dL (ref 3.5–5.0)
Alkaline Phosphatase: 76 U/L (ref 38–126)
Anion gap: 7 (ref 5–15)
BILIRUBIN TOTAL: 0.4 mg/dL (ref 0.3–1.2)
BUN: 16 mg/dL (ref 6–20)
CO2: 29 mmol/L (ref 22–32)
Calcium: 9.6 mg/dL (ref 8.9–10.3)
Chloride: 105 mmol/L (ref 101–111)
Creatinine, Ser: 0.82 mg/dL (ref 0.44–1.00)
Glucose, Bld: 108 mg/dL — ABNORMAL HIGH (ref 65–99)
POTASSIUM: 4.4 mmol/L (ref 3.5–5.1)
Sodium: 141 mmol/L (ref 135–145)
TOTAL PROTEIN: 6.3 g/dL — AB (ref 6.5–8.1)

## 2015-04-16 LAB — CBC
HCT: 40.7 % (ref 36.0–46.0)
HEMOGLOBIN: 13.8 g/dL (ref 12.0–15.0)
MCH: 32 pg (ref 26.0–34.0)
MCHC: 33.9 g/dL (ref 30.0–36.0)
MCV: 94.4 fL (ref 78.0–100.0)
PLATELETS: 206 10*3/uL (ref 150–400)
RBC: 4.31 MIL/uL (ref 3.87–5.11)
RDW: 12.7 % (ref 11.5–15.5)
WBC: 11.1 10*3/uL — AB (ref 4.0–10.5)

## 2015-04-16 LAB — URINALYSIS, ROUTINE W REFLEX MICROSCOPIC
Bilirubin Urine: NEGATIVE
Glucose, UA: NEGATIVE mg/dL
Ketones, ur: NEGATIVE mg/dL
NITRITE: NEGATIVE
PROTEIN: 30 mg/dL — AB
SPECIFIC GRAVITY, URINE: 1.011 (ref 1.005–1.030)
pH: 6.5 (ref 5.0–8.0)

## 2015-04-16 LAB — URINE MICROSCOPIC-ADD ON

## 2015-04-16 NOTE — ED Notes (Signed)
Pt. reports right lower back pain onset this afternoon , denies dysuria or hematuria , no injury or fall .

## 2015-04-16 NOTE — ED Provider Notes (Signed)
CSN: [id number]   Arrival date & time 04/16/15 2130  History  By signing my name below, I, Altamease Oiler, attest that this documentation has been prepared under the direction and in the presence of Orpah Greek, MD. Electronically Signed: Altamease Oiler, ED Scribe. 04/17/2015. 12:36 AM.  Chief Complaint  Patient presents with  . Back Pain    HPI The history is provided by the patient. No language interpreter was used.   Lori [name] is a 69 y.o. female who presents to the Emergency Department complaining of atraumatic, gradually improving, non-radiating, aching, 8/10 at worst, right lower back pain that worsened this afternoon. Pt states that she has had some twinges of pain over the last several days but this pain is more constant and severe. She is concerned for kidney stones because she has had similar pain in the past with kidney issues but denies history of kidney stones. Pt denies dysuria, hematuria, abdominal pain, numbness, tingling, and weakness.   Past Medical History  Diagnosis Date  . Lupus ([place])   . Hormone disorder   . Hypothyroidism   . Palpitations   . IBS (irritable bowel syndrome)   . Chronic fatigue     Past Surgical History  Procedure Laterality Date  . Breast surgery  1980    LUMPECTOMY X 2 FROM RIGHT BREAST  . Tubal ligation    . Abdominal surgery      ABDOMINOPLASTY  . Laparoscopic cholecystectomy    . Tonsillectomy and adenoidectomy    . Eye surgery    . Bladder suspension    . Carotid doppler  08/28/2011    Normal, no evidence of significant diameter reduction, dissection, or vascular abnormality  . Cardiovascular stress test  08/28/2011    Normal  . Transthoracic echocardiogram  01/29/2011    EF >55%, mild concentric LVH    Family History  Problem Relation Age of Onset  . Seizures Mother   . Transient ischemic attack Mother   . Cancer Father 43    COLON  . Diabetes Father   . Heart disease Father   . Breast cancer Maternal Aunt    . Heart disease Paternal Uncle   . Breast cancer Maternal Aunt     Social History  Substance Use Topics  . Smoking status: Never Smoker   . Smokeless tobacco: Never Used  . Alcohol Use: Yes     Comment: RARE     Review of Systems  Constitutional: Negative for fever and chills.  Genitourinary: Negative for dysuria and hematuria.  Musculoskeletal: Positive for back pain (right lower).  Neurological: Negative for weakness and numbness.   Home Medications   Prior to Admission medications   Medication Sig Start Date End Date Taking? Authorizing Provider  benzonatate (TESSALON) 100 MG capsule  07/25/14   Historical Provider, MD  BORON PO Take by mouth.    Historical Provider, MD  Cholecalciferol (VITAMIN D PO) Take 1 tablet by mouth daily.    Historical Provider, MD  Coenzyme Q10 (CO Q 10 PO) Take 1 tablet by mouth daily.    Historical Provider, MD  dextroamphetamine (DEXEDRINE SPANSULE) 5 MG 24 hr capsule Take 5 mg by mouth daily. 06/30/14   Historical Provider, MD  dextroamphetamine (DEXTROSTAT) 5 MG tablet Take 5 mg by mouth daily.    Historical Provider, MD  docusate sodium (COLACE) 100 MG capsule Take 100 mg by mouth 2 (two) times daily.    Historical Provider, MD  fexofenadine (ALLEGRA) 180 MG tablet Take  180 mg by mouth daily.    Historical Provider, MD  fish oil-omega-3 fatty acids 1000 MG capsule Take 1 g by mouth daily.    Historical Provider, MD  fluconazole (DIFLUCAN) 100 MG tablet Take 1 tablet by mouth as needed. 04/10/14   Historical Provider, MD  Glucosamine HCl (GLUCOSAMINE PO) Take 1 tablet by mouth 2 (two) times daily.    Historical Provider, MD  GLUTAMINE PO Take 1 tablet by mouth 2 (two) times daily.    Historical Provider, MD  LYSINE PO Take 1 tablet by mouth 3 (three) times a week.    Historical Provider, MD  MAGNESIUM PO Take 1 tablet by mouth daily.    Historical Provider, MD  MELATONIN PO Take 1 tablet by mouth at bedtime.    Historical Provider, MD  metoprolol  succinate (TOPROL XL) 25 MG 24 hr tablet Take 1 tablet in the morning and 1/2 tablet in the evening 04/11/14   Troy Sine, MD  Multiple Vitamin (MULTIVITAMIN) tablet Take 1 tablet by mouth daily.    Historical Provider, MD  NATURE-THROID 113.75 MG TABS  08/07/14   Historical Provider, MD  NONFORMULARY OR COMPOUNDED ITEM Boric Acid 600 mg caps. S: insert vaginally twice daily x 2 weeks then twice weekly to complete 3 mos of treatment. 08/03/14   Terrance Mass, MD  potassium chloride SA (K-DUR,KLOR-CON) 20 MEQ tablet Take 1 tablet by mouth daily. 03/09/14   Historical Provider, MD  Prasterone, DHEA, 10 MG CAPS Take 7.5 mg by mouth daily.    Historical Provider, MD  Pregnenolone POWD Take 1 capsule by mouth at bedtime.    Historical Provider, MD  PRESCRIPTION MEDICATION BIEST 90/10 ESTROGEN CREAM 0.125 MG    Historical Provider, MD  Probiotic Product (PROBIOTIC DAILY PO) Take 1 tablet by mouth 2 (two) times daily.    Historical Provider, MD  progesterone (PROMETRIUM) 200 MG capsule Take 200 mg by mouth daily.    Historical Provider, MD  Simethicone 180 MG CAPS Take 1 capsule by mouth as needed.    Historical Provider, MD  Thyroid (NATURE-THROID) 97.5 MG TABS Take 1 tablet by mouth daily. 11/07/13   Historical Provider, MD    Allergies  Albumin (human); Codeine; Erythromycin; Meperidine; Metoclopramide hcl; Other; and Shellfish-derived products  Triage Vitals: BP 123/93 mmHg  Pulse 42  Temp(Src) 98.4 F (36.9 C) (Oral)  Resp 20  Wt 151 lb 5 oz (68.635 kg)  SpO2 98%  Physical Exam  Constitutional: She is oriented to person, place, and time. She appears well-developed and well-nourished. No distress.  HENT:  Head: Normocephalic and atraumatic.  Right Ear: Hearing normal.  Left Ear: Hearing normal.  Nose: Nose normal.  Mouth/Throat: Oropharynx is clear and moist and mucous membranes are normal.  Eyes: Conjunctivae and EOM are normal. Pupils are equal, round, and reactive to light.   Neck: Normal range of motion. Neck supple.  Cardiovascular: Regular rhythm, S1 normal and S2 normal.  Exam reveals no gallop and no friction rub.   No murmur heard. Pulmonary/Chest: Effort normal and breath sounds normal. No respiratory distress. She exhibits no tenderness.  Abdominal: Soft. Normal appearance and bowel sounds are normal. There is no hepatosplenomegaly. There is no tenderness. There is CVA tenderness. There is no rebound, no guarding, no tenderness at McBurney's point and negative Murphy's sign. No hernia.  Right CVA tenderness  Musculoskeletal: Normal range of motion.  Neurological: She is alert and oriented to person, place, and time. She has normal strength.  No cranial nerve deficit or sensory deficit. Coordination normal. GCS eye subscore is 4. GCS verbal subscore is 5. GCS motor subscore is 6.  Skin: Skin is warm, dry and intact. No rash noted. No cyanosis.  Psychiatric: She has a normal mood and affect. Her speech is normal and behavior is normal. Thought content normal.  Nursing note and vitals reviewed.   ED Course  Procedures   DIAGNOSTIC STUDIES: Oxygen Saturation is 98% on RA, normal by my interpretation.    COORDINATION OF CARE: 11:54 PM Discussed treatment plan which includes lab work and CT renal stone with pt at bedside and pt agreed to plan.  12:36 AM I re-evaluated the patient and provided an update on the results of her CT scan.  Labs Reviewed  COMPREHENSIVE METABOLIC PANEL - Abnormal; Notable for the following:    Glucose, Bld 108 (*)    Total Protein 6.3 (*)    All other components within normal limits  CBC - Abnormal; Notable for the following:    WBC 11.1 (*)    All other components within normal limits  URINALYSIS, ROUTINE W REFLEX MICROSCOPIC (NOT AT [facility]) - Abnormal; Notable for the following:    APPearance CLOUDY (*)    Hgb urine dipstick LARGE (*)    Protein, ur 30 (*)    Leukocytes, UA LARGE (*)    All other components within normal  limits  URINE MICROSCOPIC-ADD ON - Abnormal; Notable for the following:    Squamous Epithelial / LPF 0-5 (*)    Bacteria, UA FEW (*)    All other components within normal limits    Imaging Review Ct Renal Stone Study  04/17/2015  CLINICAL DATA:  Right flank pain and gross hematuria. EXAM: CT ABDOMEN AND PELVIS WITHOUT CONTRAST TECHNIQUE: Multidetector CT imaging of the abdomen and pelvis was performed following the standard protocol without IV contrast. COMPARISON:  Ultrasound abdomen 09/06/2014. CT abdomen and pelvis 10/25/2013 FINDINGS: Lung bases are clear. Mild right hydronephrosis and hydroureter. Two punctate size stone in the distal right ureter at the ureterovesical junction. No significant stranding around the kidney or ureter. Left kidney in ureter are unremarkable. No bladder wall thickening or bladder stones identified. Surgical absence of the gallbladder. Small esophageal hiatal hernia. The unenhanced appearance of the liver, spleen, pancreas, adrenal glands, abdominal aorta, inferior vena cava, and retroperitoneal lymph nodes is unremarkable. Diffusely scattered mesenteric and right lower quadrant lymph nodes are present but these are not pathologically enlarged and are likely reactive. Stomach, small bowel, and colon are not abnormally distended. Stool fills the colon. No free air or free fluid in the abdomen. Pelvis: Appendix is not visualized. Diverticula in the sigmoid colon without evidence of diverticulitis. No free or loculated pelvic fluid collections. No pelvic mass or lymphadenopathy. Uterus and ovaries are not enlarged. Degenerative changes in the spine and hips. No destructive bone lesions. Spondylolysis with mild anterior subluxation at L4-5. IMPRESSION: Punctate size stone in the distal right ureter with mild proximal obstruction. Electronically Signed   By: Lucienne Capers M.D.   On: 04/17/2015 00:15    I personally reviewed and evaluated these images and lab results as a  part of my medical decision-making.   MDM   Final diagnoses:  None  renal colic   Presents to the ER for evaluation of right flank pain. Pain began somewhat suddenly this evening. She reports that the pain was easing off the time of evaluation was still present. She did have microscopic hematuria present on  urinalysis and CT scan confirms punctate distal ureteral stone.  I personally performed the services described in this documentation, which was scribed in my presence. The recorded information has been reviewed and is accurate.\   Orpah Greek, MD 04/17/15 [phone]

## 2015-04-17 ENCOUNTER — Emergency Department (HOSPITAL_COMMUNITY): Payer: BLUE CROSS/BLUE SHIELD

## 2015-04-17 MED ORDER — ONDANSETRON 4 MG PO TBDP
8.0000 mg | ORAL_TABLET | Freq: Once | ORAL | Status: AC
Start: 2015-04-17 — End: 2015-04-17
  Administered 2015-04-17: 8 mg via ORAL
  Filled 2015-04-17: qty 2

## 2015-04-17 MED ORDER — OXYCODONE-ACETAMINOPHEN 5-325 MG PO TABS: 2.0000 | ORAL_TABLET | ORAL | Status: DC | PRN

## 2015-04-17 MED ORDER — OXYCODONE-ACETAMINOPHEN 5-325 MG PO TABS
1.0000 | ORAL_TABLET | Freq: Once | ORAL | Status: AC
Start: 2015-04-17 — End: 2015-04-17
  Administered 2015-04-17: 1 via ORAL
  Filled 2015-04-17: qty 1

## 2015-04-17 MED ORDER — ONDANSETRON 4 MG PO TBDP: ORAL_TABLET | ORAL | Status: DC

## 2015-04-17 MED ORDER — TAMSULOSIN HCL 0.4 MG PO CAPS: 0.4000 mg | ORAL_CAPSULE | Freq: Every day | ORAL | Status: DC

## 2015-04-17 NOTE — ED Notes (Signed)
Pt stable, ambulatory, states understanding of discharge instructions 

## 2015-04-17 NOTE — Discharge Instructions (Signed)
Kidney Stones °Kidney stones (urolithiasis) are deposits that form inside your kidneys. The intense pain is caused by the stone moving through the urinary tract. When the stone moves, the ureter goes into spasm around the stone. The stone is usually passed in the urine.  °CAUSES  °· A disorder that makes certain neck glands produce too much parathyroid hormone (primary hyperparathyroidism). °· A buildup of uric acid crystals, similar to gout in your joints. °· Narrowing (stricture) of the ureter. °· A kidney obstruction present at birth (congenital obstruction). °· Previous surgery on the kidney or ureters. °· Numerous kidney infections. °SYMPTOMS  °· Feeling sick to your stomach (nauseous). °· Throwing up (vomiting). °· Blood in the urine (hematuria). °· Pain that usually spreads (radiates) to the groin. °· Frequency or urgency of urination. °DIAGNOSIS  °· Taking a history and physical exam. °· Blood or urine tests. °· CT scan. °· Occasionally, an examination of the inside of the urinary bladder (cystoscopy) is performed. °TREATMENT  °· Observation. °· Increasing your fluid intake. °· Extracorporeal shock wave lithotripsy--This is a noninvasive procedure that uses shock waves to break up kidney stones. °· Surgery may be needed if you have severe pain or persistent obstruction. There are various surgical procedures. Most of the procedures are performed with the use of small instruments. Only small incisions are needed to accommodate these instruments, so recovery time is minimized. °The size, location, and chemical composition are all important variables that will determine the proper choice of action for you. Talk to your health care provider to better understand your situation so that you will minimize the risk of injury to yourself and your kidney.  °HOME CARE INSTRUCTIONS  °· Drink enough water and fluids to keep your urine clear or pale yellow. This will help you to pass the stone or stone fragments. °· Strain  all urine through the provided strainer. Keep all particulate matter and stones for your health care provider to see. The stone causing the pain may be as small as a grain of salt. It is very important to use the strainer each and every time you pass your urine. The collection of your stone will allow your health care provider to analyze it and verify that a stone has actually passed. The stone analysis will often identify what you can do to reduce the incidence of recurrences. °· Only take over-the-counter or prescription medicines for pain, discomfort, or fever as directed by your health care provider. °· Keep all follow-up visits as told by your health care provider. This is important. °· Get follow-up X-rays if required. The absence of pain does not always mean that the stone has passed. It may have only stopped moving. If the urine remains completely obstructed, it can cause loss of kidney function or even complete destruction of the kidney. It is your responsibility to make sure X-rays and follow-ups are completed. Ultrasounds of the kidney can show blockages and the status of the kidney. Ultrasounds are not associated with any radiation and can be performed easily in a matter of minutes. °· Make changes to your daily diet as told by your health care provider. You may be told to: °¨ Limit the amount of salt that you eat. °¨ Eat 5 or more servings of fruits and vegetables each day. °¨ Limit the amount of meat, poultry, fish, and eggs that you eat. °· Collect a 24-hour urine sample as told by your health care provider. You may need to collect another urine sample every 6-12   months. °SEEK MEDICAL CARE IF: °· You experience pain that is progressive and unresponsive to any pain medicine you have been prescribed. °SEEK IMMEDIATE MEDICAL CARE IF:  °· Pain cannot be controlled with the prescribed medicine. °· You have a fever or shaking chills. °· The severity or intensity of pain increases over 18 hours and is not  relieved by pain medicine. °· You develop a new onset of abdominal pain. °· You feel faint or pass out. °· You are unable to urinate. °  °This information is not intended to replace advice given to you by your health care provider. Make sure you discuss any questions you have with your health care provider. °  °Document Released: 05/12/2005 Document Revised: 01/31/2015 Document Reviewed: 10/13/2012 °Elsevier Interactive Patient Education ©2016 [employer] ° °

## 2015-04-24 DIAGNOSIS — N390 Urinary tract infection, site not specified: Secondary | ICD-10-CM | POA: Insufficient documentation

## 2015-05-04 ENCOUNTER — Other Ambulatory Visit: Payer: Self-pay | Admitting: Cardiovascular Disease

## 2015-05-14 ENCOUNTER — Ambulatory Visit
Admission: RE | Admit: 2015-05-14 | Discharge: 2015-05-14 | Disposition: A | Discharge status: Home / Self Care | Payer: BLUE CROSS/BLUE SHIELD | Source: Ambulatory Visit

## 2015-05-14 DIAGNOSIS — Z1231 Encounter for screening mammogram for malignant neoplasm of breast: Secondary | ICD-10-CM

## 2015-06-05 ENCOUNTER — Encounter: Payer: BC Managed Care – PPO | Admitting: Gynecology

## 2015-06-11 ENCOUNTER — Ambulatory Visit (INDEPENDENT_AMBULATORY_CARE_PROVIDER_SITE_OTHER): Payer: BLUE CROSS/BLUE SHIELD

## 2015-06-11 ENCOUNTER — Ambulatory Visit (INDEPENDENT_AMBULATORY_CARE_PROVIDER_SITE_OTHER): Payer: BLUE CROSS/BLUE SHIELD | Admitting: Podiatry

## 2015-06-11 ENCOUNTER — Encounter: Payer: Self-pay | Admitting: Podiatry

## 2015-06-11 VITALS — BP 104/73 | HR 63 | Resp 16

## 2015-06-11 DIAGNOSIS — M79672 Pain in left foot: Secondary | ICD-10-CM

## 2015-06-11 DIAGNOSIS — G5762 Lesion of plantar nerve, left lower limb: Secondary | ICD-10-CM

## 2015-06-11 DIAGNOSIS — M779 Enthesopathy, unspecified: Secondary | ICD-10-CM

## 2015-06-11 DIAGNOSIS — M79671 Pain in right foot: Secondary | ICD-10-CM | POA: Diagnosis not present

## 2015-06-11 DIAGNOSIS — L84 Corns and callosities: Secondary | ICD-10-CM | POA: Diagnosis not present

## 2015-06-11 DIAGNOSIS — D3610 Benign neoplasm of peripheral nerves and autonomic nervous system, unspecified: Secondary | ICD-10-CM

## 2015-06-11 DIAGNOSIS — M2042 Other hammer toe(s) (acquired), left foot: Secondary | ICD-10-CM

## 2015-06-11 MED ORDER — TRIAMCINOLONE ACETONIDE 10 MG/ML IJ SUSP
10.0000 mg | Freq: Once | INTRAMUSCULAR | Status: AC
  Administered 2015-06-11: 10 mg

## 2015-06-11 NOTE — Progress Notes (Signed)
Subjective:     Patient ID: Lori [name], female   DOB: August 21, 1945, 70 y.o.   MRN: [id number]  HPI patient presents with several problems with one being the left ankle that she's turned about 6 months ago and also between the fourth and fifth toes of very painful area and a shooting pain third interspace right foot. We had worked on that in the past   Review of Systems     Objective:   Physical Exam Neurovascular status unchanged muscle strength was found to be adequate with range of motion within normal limits with patient noted to have inflammatory changes at the interphalangeal joint left fourth toe had a proximal phalanx with keratotic lesion formation and exquisite discomfort left sinus tarsi and also a positive Mulder sign with shooting pains third interspace right foot    Assessment:     Inflammatory capsulitis of the left fourth toe with keratotic lesion and capsulitis of the left sinus tarsi and probable neuroma symptomatology right    Plan:     H&P and all conditions reviewed. At this point I did a nerve block of the left fourth toe I then injected the sinus tarsi left 3 [place]igan Kenalog 5 mg Xylocaine and I injected the third interspace right with a neuro lysis. 5 alcohol solution 1.3 mL with Marcaine and for the left fourth digit after appropriate numbness I did a careful inner articular capsular injection 2 mg [place] some Kenalog and debrided lesion fully and applied padding. Patient will be seen back to reevaluate

## 2015-06-27 ENCOUNTER — Ambulatory Visit (INDEPENDENT_AMBULATORY_CARE_PROVIDER_SITE_OTHER): Payer: BLUE CROSS/BLUE SHIELD | Admitting: Gynecology

## 2015-06-27 ENCOUNTER — Encounter: Payer: Self-pay | Admitting: Gynecology

## 2015-06-27 VITALS — BP 128/86 | Ht 66.0 in | Wt 149.0 lb

## 2015-06-27 DIAGNOSIS — M8580 Other specified disorders of bone density and structure, unspecified site: Secondary | ICD-10-CM | POA: Diagnosis not present

## 2015-06-27 DIAGNOSIS — Z01419 Encounter for gynecological examination (general) (routine) without abnormal findings: Secondary | ICD-10-CM

## 2015-06-27 NOTE — Patient Instructions (Signed)

## 2015-06-27 NOTE — Progress Notes (Signed)
Lori [name] 12/28/1945 [id number]   History:    70 y.o.  for annual gyn exam with no complaints today. Patient with prior history of bladder sling revisionin [facility]. Patient has had a history of laparoscopic BSO in the past benign pathology patient with no past history of abnormal Pap smears.Colonoscopy 2013 normal (father with history of colon cancer). Patient has been followed also by her primary care physician Dr. Marton Redwood who has been doing her labs as well as her holistic medicine doctor Dr.Agustedes. They both had been drawing her labs and been treating her for her lupus as well as for her hypothyroidism. Her holistic medicine physician has had her on estrogen cream 0.125 daily along with Prometrium 200 mg daily along with DHEA and pregnenolone. We have discussed in the past the ACOG guidelines as well as detailed discussion of the women's health initiative study indicating that women who need to be on hormone replacement therapy should be on a cyclical regimen and not continuous regimen of estrogen and progesterone to minimize the risk of breast cancer. Patient states that her holistic medicine provider decrease her Prometrium 200 mg daily. She reports no vaginal bleeding.  Review of her record indicated that patient's bone density study in 2015 had demonstrated that her lowest T score was at the right femoral neck with a value of -2.2. Frax analysis demonstrated that her 10 year risk of osteoporotic fracture was 20% right at the cut off range. And 2.6% risk of fracture of the hip. Patient has refused to go on any medication until this year's bone density study report.  Patient with no past history of any abnormal Pap smears in the past  Past medical history,surgical history, family history and social history were all reviewed and documented in the EPIC chart.  Gynecologic History No LMP recorded. Patient is postmenopausal. Contraception: post menopausal  status Last Pap: 2013. Results were: normal Last mammogram: 2016. Results were: normal  Obstetric History OB History  Gravida Para Term Preterm AB SAB TAB Ectopic Multiple Living  4 3 3  1 1    3     # Outcome Date GA Lbr Len/2nd Weight Sex Delivery Anes PTL Lv  4 SAB           3 Term     F Vag-Spont  N Y  2 Term     M Vag-Spont  N Y  1 Term     F Vag-Spont  N Y       ROS: A ROS was performed and pertinent positives and negatives are included in the history.  GENERAL: No fevers or chills. HEENT: No change in vision, no earache, sore throat or sinus congestion. NECK: No pain or stiffness. CARDIOVASCULAR: No chest pain or pressure. No palpitations. PULMONARY: No shortness of breath, cough or wheeze. GASTROINTESTINAL: No abdominal pain, nausea, vomiting or diarrhea, melena or bright red blood per rectum. GENITOURINARY: No urinary frequency, urgency, hesitancy or dysuria. MUSCULOSKELETAL: No joint or muscle pain, no back pain, no recent trauma. DERMATOLOGIC: No rash, no itching, no lesions. ENDOCRINE: No polyuria, polydipsia, no heat or cold intolerance. No recent change in weight. HEMATOLOGICAL: No anemia or easy bruising or bleeding. NEUROLOGIC: No headache, seizures, numbness, tingling or weakness. PSYCHIATRIC: No depression, no loss of interest in normal activity or change in sleep pattern.     Exam: chaperone present  BP 128/86 mmHg  Ht 5\' 6"  (1.676 m)  Wt 149 lb (67.586 kg)  BMI 24.06  kg/m2  Body mass index is 24.06 kg/(m^2).  General appearance : Well developed well nourished female. No acute distress HEENT: Eyes: no retinal hemorrhage or exudates,  Neck supple, trachea midline, no carotid bruits, no thyroidmegaly Lungs: Clear to auscultation, no rhonchi or wheezes, or rib retractions  Heart: Regular rate and rhythm, no murmurs or gallops Breast:Examined in sitting and supine position were symmetrical in appearance, no palpable masses or tenderness,  no skin retraction, no  nipple inversion, no nipple discharge, no skin discoloration, no axillary or supraclavicular lymphadenopathy Abdomen: no palpable masses or tenderness, no rebound or guarding Extremities: no edema or skin discoloration or tenderness  Pelvic:  Bartholin, Urethra, Skene Glands: Within normal limits             Vagina: No gross lesions or discharge, atrophic changes  Cervix: No gross lesions or discharge  Uterus  anteverted, normal size, shape and consistency, non-tender and mobile  Adnexa  Without masses or tenderness  Anus and perineum  normal   Rectovaginal  normal sphincter tone without palpated masses or tenderness             Hemoccult PCP provides     Assessment/Plan:  70 y.o. female for annual exam with history of osteopenia with Frax analysis indicating that she is at risk for fracture has decided not to go on any treatment until this year's bone density study. Patient to schedule her bone density study here in the office later this month. Her PCP has been doing her blood work. Pap smear no longer indicated.   Terrance Mass MD, 11:36 AM 06/27/2015

## 2015-07-19 ENCOUNTER — Encounter: Payer: Self-pay | Admitting: Gynecology

## 2015-07-19 ENCOUNTER — Ambulatory Visit (INDEPENDENT_AMBULATORY_CARE_PROVIDER_SITE_OTHER): Payer: BLUE CROSS/BLUE SHIELD

## 2015-07-19 ENCOUNTER — Other Ambulatory Visit: Payer: Self-pay | Admitting: Gynecology

## 2015-07-19 DIAGNOSIS — M810 Age-related osteoporosis without current pathological fracture: Secondary | ICD-10-CM

## 2015-07-19 DIAGNOSIS — M8580 Other specified disorders of bone density and structure, unspecified site: Secondary | ICD-10-CM

## 2015-08-17 ENCOUNTER — Telehealth: Payer: Self-pay | Admitting: Podiatry

## 2015-08-17 ENCOUNTER — Other Ambulatory Visit: Payer: Self-pay | Admitting: Gynecology

## 2015-08-17 DIAGNOSIS — M810 Age-related osteoporosis without current pathological fracture: Secondary | ICD-10-CM

## 2015-08-17 NOTE — Telephone Encounter (Signed)
Pt left voicemail for a call back to schedule an appt. I called back and had to leave a message for pt to call me back

## 2015-08-20 ENCOUNTER — Ambulatory Visit: Payer: BLUE CROSS/BLUE SHIELD | Admitting: Podiatry

## 2015-08-20 ENCOUNTER — Other Ambulatory Visit: Payer: Self-pay | Admitting: *Deleted

## 2015-08-20 DIAGNOSIS — R7989 Other specified abnormal findings of blood chemistry: Secondary | ICD-10-CM

## 2015-08-21 ENCOUNTER — Ambulatory Visit: Payer: BLUE CROSS/BLUE SHIELD | Admitting: Family

## 2015-08-21 ENCOUNTER — Other Ambulatory Visit: Payer: BLUE CROSS/BLUE SHIELD

## 2015-08-27 ENCOUNTER — Ambulatory Visit (HOSPITAL_BASED_OUTPATIENT_CLINIC_OR_DEPARTMENT_OTHER): Payer: BLUE CROSS/BLUE SHIELD

## 2015-08-27 ENCOUNTER — Ambulatory Visit (HOSPITAL_BASED_OUTPATIENT_CLINIC_OR_DEPARTMENT_OTHER): Payer: BLUE CROSS/BLUE SHIELD | Admitting: Family

## 2015-08-27 ENCOUNTER — Encounter: Payer: Self-pay | Admitting: Family

## 2015-08-27 ENCOUNTER — Ambulatory Visit (INDEPENDENT_AMBULATORY_CARE_PROVIDER_SITE_OTHER): Payer: BLUE CROSS/BLUE SHIELD | Admitting: Podiatry

## 2015-08-27 ENCOUNTER — Other Ambulatory Visit (HOSPITAL_BASED_OUTPATIENT_CLINIC_OR_DEPARTMENT_OTHER): Payer: BLUE CROSS/BLUE SHIELD

## 2015-08-27 ENCOUNTER — Encounter: Payer: Self-pay | Admitting: Podiatry

## 2015-08-27 DIAGNOSIS — G5761 Lesion of plantar nerve, right lower limb: Secondary | ICD-10-CM | POA: Diagnosis not present

## 2015-08-27 DIAGNOSIS — D3610 Benign neoplasm of peripheral nerves and autonomic nervous system, unspecified: Secondary | ICD-10-CM

## 2015-08-27 DIAGNOSIS — M2042 Other hammer toe(s) (acquired), left foot: Secondary | ICD-10-CM

## 2015-08-27 DIAGNOSIS — R7989 Other specified abnormal findings of blood chemistry: Secondary | ICD-10-CM

## 2015-08-27 LAB — CBC WITH DIFFERENTIAL/PLATELET
BASO%: 0.2 % (ref 0.0–2.0)
BASOS ABS: 0 10*3/uL (ref 0.0–0.1)
EOS ABS: 0 10*3/uL (ref 0.0–0.5)
EOS%: 0.7 % (ref 0.0–7.0)
HCT: 42.7 % (ref 34.8–46.6)
HGB: 14.9 g/dL (ref 11.6–15.9)
LYMPH%: 52.3 % — AB (ref 14.0–49.7)
MCH: 32.1 pg (ref 25.1–34.0)
MCHC: 34.9 g/dL (ref 31.5–36.0)
MCV: 92 fL (ref 79.5–101.0)
MONO#: 0.5 10*3/uL (ref 0.1–0.9)
MONO%: 11.9 % (ref 0.0–14.0)
NEUT#: 1.6 10*3/uL (ref 1.5–6.5)
NEUT%: 34.9 % — ABNORMAL LOW (ref 38.4–76.8)
PLATELETS: 164 10*3/uL (ref 145–400)
RBC: 4.64 10*6/uL (ref 3.70–5.45)
RDW: 12.6 % (ref 11.2–14.5)
WBC: 4.5 10*3/uL (ref 3.9–10.3)
lymph#: 2.4 10*3/uL (ref 0.9–3.3)
nRBC: 0 % (ref 0–0)

## 2015-08-27 LAB — COMPREHENSIVE METABOLIC PANEL
ALT: 92 U/L — AB (ref 0–55)
AST: 49 U/L — AB (ref 5–34)
Albumin: 3.8 g/dL (ref 3.5–5.0)
Alkaline Phosphatase: 70 U/L (ref 40–150)
Anion Gap: 6 mEq/L (ref 3–11)
BUN: 11.6 mg/dL (ref 7.0–26.0)
CALCIUM: 9.1 mg/dL (ref 8.4–10.4)
CHLORIDE: 109 meq/L (ref 98–109)
CO2: 21 meq/L — AB (ref 22–29)
CREATININE: 0.8 mg/dL (ref 0.6–1.1)
EGFR: 79 mL/min/{1.73_m2} — ABNORMAL LOW (ref >90)
Glucose: 110 mg/dl (ref 70–140)
Potassium: 4.1 mEq/L (ref 3.5–5.1)
Sodium: 136 mEq/L (ref 136–145)
TOTAL PROTEIN: 6.9 g/dL (ref 6.4–8.3)
Total Bilirubin: 0.64 mg/dL (ref 0.20–1.20)

## 2015-08-27 LAB — FERRITIN: Ferritin: 116 ng/ml (ref 9–269)

## 2015-08-27 LAB — IRON AND TIBC
%SAT: 99 % — AB (ref 21–57)
Iron: 221 ug/dL — ABNORMAL HIGH (ref 41–142)
TIBC: 224 ug/dL — ABNORMAL LOW (ref 236–444)
UIBC: 3 ug/dL — AB (ref 120–384)

## 2015-08-27 NOTE — Patient Instructions (Signed)

## 2015-08-27 NOTE — Progress Notes (Signed)
Lori [name] presents today for phlebotomy per MD orders. Phlebotomy procedure started at 1020 and ended at 1030. 500 grams removed. Patient observed for 30 minutes after procedure without any incident. Patient tolerated procedure well. IV needle removed intact.

## 2015-08-27 NOTE — Progress Notes (Signed)
Hematology and Oncology Follow Up Visit  Lori [name] [id number] 1946-01-31 70 y.o. 08/27/2015   Principle Diagnosis:  Hemochromatosis - heterozygous for the [id number] mutation   Current Therapy:   Phlebotomy as indicated to keep ferritin < 50    Interim History:  Lori [name] is here today for a follow-up and phlebotomy. She states that she recently had labs drawn with her autoimmune specialist and her ferritin was "in the 70's."  She is feeling weak and having dizziness and blurry vision. She has also noticed a metallic taste in her mouth.  She has not had a phlebotomy in quite a while. She was last seen in our office in March of last year. Her ferritin in May of 2016 was 29.  No fever, chills, n/v, cough, rash, SOB, chest pain, abdominal pain or changes in bowel or bladder habits.  She has occasional palpitations and takes Toprol-XL daily.  No swelling, tenderness, numbness or tingling in her extremities. No c/o joint aches or pains.  Her appetite is ok and she is staying well hydrated. Her weight is unchanged.    Medications:    Medication List       This list is accurate as of: 08/27/15  9:52 AM.  Always use your most recent med list.               CO Q 10 PO  Take 1 tablet by mouth daily.     dextroamphetamine 5 MG 24 hr capsule  Commonly known as:  DEXEDRINE SPANSULE  Take 5 mg by mouth daily.     dextroamphetamine 5 MG tablet  Commonly known as:  DEXTROSTAT  Take 5 mg by mouth daily.     fish oil-omega-3 fatty acids 1000 MG capsule  Take 1 g by mouth daily.     GLUCOSAMINE PO  Take 1 tablet by mouth 2 (two) times daily.     GLUTAMINE PO  Take 1 tablet by mouth 2 (two) times daily.     LYSINE PO  Take 1 tablet by mouth 3 (three) times a week.     MAGNESIUM PO  Take 1 tablet by mouth daily.     MELATONIN PO  Take 1 tablet by mouth at bedtime.     metoprolol succinate 25 MG 24 hr tablet  Commonly known as:  TOPROL-XL  TAKE 1 TABLET IN AM AND 1/2 TABLET  IN THE PM.     multivitamin tablet  Take 1 tablet by mouth daily.     NATURE-THROID 97.5 MG Tabs  Generic drug:  Thyroid  Take 1 tablet by mouth daily.     NONFORMULARY OR COMPOUNDED ITEM  Boric Acid 600 mg caps. S: insert vaginally twice daily x 2 weeks then twice weekly to complete 3 mos of treatment.     ondansetron 4 MG disintegrating tablet  Commonly known as:  ZOFRAN ODT  4mg  ODT q4 hours prn nausea/vomit     potassium chloride SA 20 MEQ tablet  Commonly known as:  K-DUR,KLOR-CON  Take 1 tablet by mouth daily.     Prasterone (DHEA) 10 MG Caps  Take 7.5 mg by mouth daily.     PROBIOTIC DAILY PO  Take 1 tablet by mouth 2 (two) times daily.     progesterone 200 MG capsule  Commonly known as:  PROMETRIUM  Take 200 mg by mouth daily.     sulfamethoxazole-trimethoprim 800-160 MG tablet  Commonly known as:  BACTRIM DS,SEPTRA DS     VITAMIN D  PO  Take 1 tablet by mouth daily.        Allergies:  Allergies  Allergen Reactions  . Albumin (Human)   . Codeine   . Erythromycin   . Keflex [Cephalexin] Itching  . Meperidine   . Metoclopramide Hcl   . Other Other (See Comments)    Beef Chicken Dairy Eggs  . Shellfish-Derived Products   . Oxycodone Nausea And Vomiting    Past Medical History, Surgical history, Social history, and Family History were reviewed and updated.  Review of Systems: All other 10 point review of systems is negative.   Physical Exam:  height is 5\' 6"  (1.676 m) and weight is 149 lb (67.586 kg). Her oral temperature is 98.1 F (36.7 C). Her blood pressure is 105/56 and her pulse is 80. Her respiration is 14.   Wt Readings from Last 3 Encounters:  08/27/15 149 lb (67.586 kg)  06/27/15 149 lb (67.586 kg)  04/16/15 151 lb 5 oz (68.635 kg)    Ocular: Sclerae unicteric, pupils equal, round and reactive to light Ear-nose-throat: Oropharynx clear, dentition fair Lymphatic: No cervical supraclavicular or axillary adenopathy Lungs no rales or  rhonchi, good excursion bilaterally Heart regular rate and rhythm, no murmur appreciated Abd soft, nontender, positive bowel sounds, no liver or spleen tip palpated on exam, no fluid wave MSK no focal spinal tenderness, no joint edema Neuro: non-focal, well-oriented, appropriate affect Breasts: Deferred  Lab Results  Component Value Date   WBC 4.5 08/27/2015   HGB 14.9 08/27/2015   HCT 42.7 08/27/2015   MCV 92.0 08/27/2015   PLT 164 08/27/2015   Lab Results  Component Value Date   FERRITIN 29 10/18/2014   IRON 145* 10/18/2014   TIBC 226* 10/18/2014   UIBC 81* 10/18/2014   IRONPCTSAT 64* 10/18/2014   Lab Results  Component Value Date   RBC 4.64 08/27/2015   No results found for: KPAFRELGTCHN, LAMBDASER, KAPLAMBRATIO No results found for: IGGSERUM, IGA, IGMSERUM No results found for: Odetta Pink, SPEI   Chemistry      Component Value Date/Time   NA 141 04/16/2015 2159   NA 141 08/16/2014 1212   K 4.4 04/16/2015 2159   K 4.3 08/16/2014 1212   CL 105 04/16/2015 2159   CL 103 08/16/2014 1212   CO2 29 04/16/2015 2159   CO2 25 08/16/2014 1212   BUN 16 04/16/2015 2159   BUN 14 08/16/2014 1212   CREATININE 0.82 04/16/2015 2159   CREATININE 0.8 08/16/2014 1212      Component Value Date/Time   CALCIUM 9.6 04/16/2015 2159   CALCIUM 9.5 08/16/2014 1212   ALKPHOS 76 04/16/2015 2159   ALKPHOS 71 08/16/2014 1212   AST 38 04/16/2015 2159   AST 45* 08/16/2014 1212   ALT 34 04/16/2015 2159   ALT 49* 08/16/2014 1212   BILITOT 0.4 04/16/2015 2159   BILITOT 0.80 08/16/2014 1212     Impression and Plan: Lori [name] is a pleasant 70 yo white female with a history of hemochromocytosis - heterozygous for the Cys282Tyr. She is herer today with c/o "ferritin in the 70's" and symptomatic with weakness, blurry vision, dizziness and a metallic taste in her mouth.  We will go ahead and phlebotomize her while she is here today. He last  phlebotomy was over a year ago. We will see what her iron studies show and if we need to get her on a phlebotomy program.  We will go ahead and plan  to see her back in 4 months for lab work and follow-up.  She will contact us with any questions or concerns. We can certainly see her sooner if need be.    Eliezer Bottom, NP 4/3/20179:52 AM

## 2015-08-28 ENCOUNTER — Other Ambulatory Visit: Payer: BLUE CROSS/BLUE SHIELD

## 2015-08-28 ENCOUNTER — Telehealth: Payer: Self-pay | Admitting: *Deleted

## 2015-08-28 ENCOUNTER — Other Ambulatory Visit: Payer: Self-pay | Admitting: *Deleted

## 2015-08-28 DIAGNOSIS — M810 Age-related osteoporosis without current pathological fracture: Secondary | ICD-10-CM

## 2015-08-28 NOTE — Telephone Encounter (Addendum)
Patient aware of results. Phlebotomy appointment made.   ----- Message from Eliezer Bottom, NP sent at 08/28/2015  9:23 AM EDT ----- Regarding: phlebotomy Her ferritin is high at 116. She will need a second phlebotomy next week and then we will see her back in 3 months for a follow-up and labs. Thank you!  Sarah  ----- Message -----    From: Volanda Napoleon, MD    Sent: 08/27/2015   5:27 PM      To: Eliezer Bottom, NP  Please see me.  Laurey Arrow ----- Message -----    From: [facility]    Sent: 08/27/2015   1:51 PM      To: Volanda Napoleon, MD

## 2015-08-29 LAB — VITAMIN D 25 HYDROXY (VIT D DEFICIENCY, FRACTURES): Vit D, 25-Hydroxy: 52 ng/mL (ref 30–100)

## 2015-08-29 LAB — PTH, INTACT AND CALCIUM
Calcium: 9.5 mg/dL (ref 8.4–10.5)
PTH: 26 pg/mL (ref 14–64)

## 2015-08-29 NOTE — Progress Notes (Signed)
Subjective:     Patient ID: Lori [name], female   DOB: [birth year], 70 y.o.   MRN: [id number]  HPI patient states I been getting some pain between third and fourth toe on my left foot and the right foot still feels somewhat better. It shoots and radiates and at times makes it hard to wear shoe gear comfortably   Review of Systems     Objective:   Physical Exam Neurovascular status intact muscle strength adequate with radiating discomfort third intermetatarsal space left with shooting pain into the adjacent digits    Assessment:     Neuroma symptomatology left with also possible digital pathology    Plan:     Sterile prep done of left foot and then injected directly into the nerve root prior to breaking his digital branches with a combination of purified alcohol Marcaine solution

## 2015-08-30 ENCOUNTER — Telehealth: Payer: Self-pay | Admitting: *Deleted

## 2015-08-30 NOTE — Telephone Encounter (Signed)
Patient c/o GI discomfort she believes to be related to ulcers. She has had them previously and the discomfort is similar to those events. She is an active patient with her GI doctor.   Patient instructed to call GI physician for possible assessment/work up of her symptoms. She agrees and will call him.

## 2015-09-04 ENCOUNTER — Ambulatory Visit (INDEPENDENT_AMBULATORY_CARE_PROVIDER_SITE_OTHER): Payer: BLUE CROSS/BLUE SHIELD | Admitting: Gynecology

## 2015-09-04 ENCOUNTER — Encounter: Payer: Self-pay | Admitting: Gynecology

## 2015-09-04 VITALS — BP 112/70

## 2015-09-04 DIAGNOSIS — D849 Immunodeficiency, unspecified: Secondary | ICD-10-CM

## 2015-09-04 DIAGNOSIS — M810 Age-related osteoporosis without current pathological fracture: Secondary | ICD-10-CM

## 2015-09-04 DIAGNOSIS — D899 Disorder involving the immune mechanism, unspecified: Secondary | ICD-10-CM

## 2015-09-04 DIAGNOSIS — M1990 Unspecified osteoarthritis, unspecified site: Secondary | ICD-10-CM | POA: Diagnosis not present

## 2015-09-04 DIAGNOSIS — Z8719 Personal history of other diseases of the digestive system: Secondary | ICD-10-CM

## 2015-09-04 DIAGNOSIS — M351 Other overlap syndromes: Secondary | ICD-10-CM | POA: Diagnosis not present

## 2015-09-04 DIAGNOSIS — Z8639 Personal history of other endocrine, nutritional and metabolic disease: Secondary | ICD-10-CM | POA: Diagnosis not present

## 2015-09-04 NOTE — Patient Instructions (Signed)
Osteoporosis Osteoporosis is the thinning and loss of density in the bones. Osteoporosis makes the bones more brittle, fragile, and likely to break (fracture). Over time, osteoporosis can cause the bones to become so weak that they fracture after a simple fall. The bones most likely to fracture are the bones in the hip, wrist, and spine. CAUSES  The exact cause is not known. RISK FACTORS Anyone can develop osteoporosis. You may be at greater risk if you have a family history of the condition or have poor nutrition. You may also have a higher risk if you are:   Female.   69 years old or older.  A smoker.  Not physically active.   White or Asian.  Slender. SIGNS AND SYMPTOMS  A fracture might be the first sign of the disease, especially if it results from a fall or injury that would not usually cause a bone to break. Other signs and symptoms include:   Low back and neck pain.  Stooped posture.  Height loss. DIAGNOSIS  To make a diagnosis, your health care provider may:  Take a medical history.  Perform a physical exam.  Order tests, such as:  A bone mineral density test.  A dual-energy X-ray absorptiometry test. TREATMENT  The goal of osteoporosis treatment is to strengthen your bones to reduce your risk of a fracture. Treatment may involve:  Making lifestyle changes, such as:  Eating a diet rich in calcium.  Doing weight-bearing and muscle-strengthening exercises.  Stopping tobacco use.  Limiting alcohol intake.  Taking medicine to slow the process of bone loss or to increase bone density.  Monitoring your levels of calcium and vitamin D. HOME CARE INSTRUCTIONS  Include calcium and vitamin D in your diet. Calcium is important for bone health, and vitamin D helps the body absorb calcium.  Perform weight-bearing and muscle-strengthening exercises as directed by your health care provider.  Do not use any tobacco products, including cigarettes, chewing  tobacco, and electronic cigarettes. If you need help quitting, ask your health care provider.  Limit your alcohol intake.  Take medicines only as directed by your health care provider.  Keep all follow-up visits as directed by your health care provider. This is important.  Take precautions at home to lower your risk of falling, such as:  Keeping rooms well lit and clutter free.  Installing safety rails on stairs.  Using rubber mats in the bathroom and other areas that are often wet or slippery. SEEK IMMEDIATE MEDICAL CARE IF:  You fall or injure yourself.    This information is not intended to replace advice given to you by your health care provider. Make sure you discuss any questions you have with your health care provider.   Document Released: 02/19/2005 Document Revised: 06/02/2014 Document Reviewed: 10/20/2013 Elsevier Interactive Patient Education 2016 Elsevier Inc. Zoledronic Acid injection (Paget's Disease, Osteoporosis) What is this medicine? ZOLEDRONIC ACID (ZOE le dron ik AS id) lowers the amount of calcium loss from bone. It is used to treat Paget's disease and osteoporosis in women. This medicine may be used for other purposes; ask your health care provider or pharmacist if you have questions. What should I tell my health care provider before I take this medicine? They need to know if you have any of these conditions: -aspirin-sensitive asthma -cancer, especially if you are receiving medicines used to treat cancer -dental disease or wear dentures -infection -kidney disease -low levels of calcium in the blood -past surgery on the parathyroid gland or intestines -  receiving corticosteroids like dexamethasone or prednisone -an unusual or allergic reaction to zoledronic acid, other medicines, foods, dyes, or preservatives -pregnant or trying to get pregnant -breast-feeding How should I use this medicine? This medicine is for infusion into a vein. It is given by a  health care professional in a hospital or clinic setting. Talk to your pediatrician regarding the use of this medicine in children. This medicine is not approved for use in children. Overdosage: If you think you have taken too much of this medicine contact a poison control center or emergency room at once. NOTE: This medicine is only for you. Do not share this medicine with others. What if I miss a dose? It is important not to miss your dose. Call your doctor or health care professional if you are unable to keep an appointment. What may interact with this medicine? -certain antibiotics given by injection -NSAIDs, medicines for pain and inflammation, like ibuprofen or naproxen -some diuretics like bumetanide, furosemide -teriparatide This list may not describe all possible interactions. Give your health care provider a list of all the medicines, herbs, non-prescription drugs, or dietary supplements you use. Also tell them if you smoke, drink alcohol, or use illegal drugs. Some items may interact with your medicine. What should I watch for while using this medicine? Visit your doctor or health care professional for regular checkups. It may be some time before you see the benefit from this medicine. Do not stop taking your medicine unless your doctor tells you to. Your doctor may order blood tests or other tests to see how you are doing. Women should inform their doctor if they wish to become pregnant or think they might be pregnant. There is a potential for serious side effects to an unborn child. Talk to your health care professional or pharmacist for more information. You should make sure that you get enough calcium and vitamin D while you are taking this medicine. Discuss the foods you eat and the vitamins you take with your health care professional. Some people who take this medicine have severe bone, joint, and/or muscle pain. This medicine may also increase your risk for jaw problems or a broken  thigh bone. Tell your doctor right away if you have severe pain in your jaw, bones, joints, or muscles. Tell your doctor if you have any pain that does not go away or that gets worse. Tell your dentist and dental surgeon that you are taking this medicine. You should not have major dental surgery while on this medicine. See your dentist to have a dental exam and fix any dental problems before starting this medicine. Take good care of your teeth while on this medicine. Make sure you see your dentist for regular follow-up appointments. What side effects may I notice from receiving this medicine? Side effects that you should report to your doctor or health care professional as soon as possible: -allergic reactions like skin rash, itching or hives, swelling of the face, lips, or tongue -anxiety, confusion, or depression -breathing problems -changes in vision -eye pain -feeling faint or lightheaded, falls -jaw pain, especially after dental work -mouth sores -muscle cramps, stiffness, or weakness -redness, blistering, peeling or loosening of the skin, including inside the mouth -trouble passing urine or change in the amount of urine Side effects that usually do not require medical attention (report to your doctor or health care professional if they continue or are bothersome): -bone, joint, or muscle pain -constipation -diarrhea -fever -hair loss -irritation at site where  injected -loss of appetite -nausea, vomiting -stomach upset -trouble sleeping -trouble swallowing -weak or tired This list may not describe all possible side effects. Call your doctor for medical advice about side effects. You may report side effects to FDA at 1-800-FDA-1088. Where should I keep my medicine? This drug is given in a hospital or clinic and will not be stored at home. NOTE: This sheet is a summary. It may not cover all possible information. If you have questions about this medicine, talk to your doctor,  pharmacist, or health care provider.    2016, Elsevier/Gold Standard. (2013-10-08 14:19:57)

## 2015-09-04 NOTE — Progress Notes (Signed)
   Patient is a 70 year old that presented to the office today to discuss her recent bone density study.Patient has been followed also by her primary care physician Dr. Marton Redwood who has been doing her labs as well as her holistic medicine doctor Dr.Agustedes. They both had been drawing her labs and been treating her for her lupus as well as for her hypothyroidism. Her holistic medicine physician has had her on estrogen cream 0.125 daily along with Prometrium 200 mg daily along with DHEA and pregnenolone. We have discussed in the past the ACOG guidelines as well as detailed discussion of the women's health initiative study indicating that women who need to be on hormone replacement therapy should be on a cyclical regimen and not continuous regimen of estrogen and progesterone to minimize the risk of breast cancer. Patient states that her holistic medicine provider decrease her Prometrium 200 mg daily. Patient also has history of hemachromatosis for which she sees Dr. Charlesetta Garibaldi periodically. Patient suffered in the past from esophageal strictures requiring dilatation. A she has mixed collagen tissue disease.  Review of her record indicated that patient's bone density study in 2015 had demonstrated that her lowest T score was at the right femoral neck with a value of -2.2. Frax analysis demonstrated that her 10 year risk of osteoporotic fracture was 20% right at the cut off range. And 2.6% risk of fracture of the hip. Patient has refused to go on any medication until this year's bone density study report.  Bone density study done here in our office in February of this year had demonstrated that her lowest T score was at the right femoral neck with a value of -2.6 indicating osteoporosis. Patient's bone mass is 25% below normal with a 8 times greater risk of fracture.  Because of her history of esophageal stricture she cannot take oral bisphosphonate. She does have also mixed connective tissue disease and  immunocompromise at times. We discussed different treatment options and Prolia would be contraindicated. Oral bisphosphonate because of her history of gastroesophageal reflux and esophageal stricture she would not be able to tolerate. She would be an ideal candidate for Reclast. I will check with her rheumatologist as well as her internist and her oncologist to confirmed that indeed there is no contraindication. Her recent calcium, vitamin D and PTH level were normal. The following with discussed as well as literature information provided:  The risk and benefits of IV bisphosphonate were discussed with the patient today to include a significant risk reduction for vertebral, hip and non-vertebral fractures. Potential risk of therapy were also discussed to include a 10-15% chance for a flulike reaction which may last 2-3 days after IV bisphosphonate. We also discussed that longer reactions lasting perhaps weeks to months have been rarely reported to the FDA following IV bisphosphonate treatment. This would also require symptomatic management. Other potential risk that were discussed included the following: Regular eye irritation, approximately 1 and 1000-10,000 risk for osteonecrosis of the jaw as well as a risk for atypical femoral fracture of approximately 1 in 5000-10,000 based on current data on oral bisphosphonate. Signs and symptoms of atypical femoral fracture were also discussed and the patient was asked to contact the office if she develops any of these symptoms.  Greater than 90% of the time was spent in counseling and coordinating care for this patient with osteoporosis. Time of consultation approximately 20 minutes

## 2015-09-05 ENCOUNTER — Other Ambulatory Visit: Payer: Self-pay | Admitting: Hematology & Oncology

## 2015-09-05 ENCOUNTER — Ambulatory Visit (HOSPITAL_BASED_OUTPATIENT_CLINIC_OR_DEPARTMENT_OTHER): Payer: BLUE CROSS/BLUE SHIELD

## 2015-09-05 ENCOUNTER — Encounter: Payer: Self-pay | Admitting: Hematology & Oncology

## 2015-09-05 DIAGNOSIS — M810 Age-related osteoporosis without current pathological fracture: Secondary | ICD-10-CM

## 2015-09-05 HISTORY — DX: Age-related osteoporosis without current pathological fracture: M81.0

## 2015-09-05 NOTE — Patient Instructions (Signed)

## 2015-09-11 ENCOUNTER — Telehealth: Payer: Self-pay | Admitting: Gynecology

## 2015-09-11 NOTE — Telephone Encounter (Signed)
Called and VM regarding insurance information and labwork.

## 2015-09-11 NOTE — Telephone Encounter (Signed)
Pam I just wanted to touch base with you to see if this patient has been scheduled for her Reclast infusion. See my last and treat in epic. I have talked her 77 and all agree no contraindication.   Note from Dr Toney Rakes.  Will check insurance.  Labwork done on 08/27/15  BUN  11.6  CALCIUM 9.1    CREATININE  0.8   M 81.0

## 2015-09-12 NOTE — Telephone Encounter (Signed)
Called pt in regards to Reclast. Pt stated that Dr Marin Olp had already scheduled her for this and that Dr Toney Rakes is aware that she will be seen at the infusion center with Dr Marin Olp. No office notes seen in EPIC regarding this, however Sharrie Rothman did see an appointment in June for infusion of Zometa .Sharrie Rothman called to confirm this with Dr Antonieta Pert nurse Roselyn Reef who  confirmed appointment in June for Zometa infusion for Osteoporosis. I called pt and informed her that Dr Antonieta Pert office would follow up with her in regards to the infusion and that she should ask them any insurance questions she has. 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

## 2015-10-10 ENCOUNTER — Other Ambulatory Visit (HOSPITAL_BASED_OUTPATIENT_CLINIC_OR_DEPARTMENT_OTHER): Payer: BLUE CROSS/BLUE SHIELD

## 2015-10-10 LAB — COMPREHENSIVE METABOLIC PANEL
ALK PHOS: 70 U/L (ref 40–150)
ALT: 30 U/L (ref 0–55)
AST: 32 U/L (ref 5–34)
Albumin: 4.1 g/dL (ref 3.5–5.0)
Anion Gap: 8 mEq/L (ref 3–11)
BUN: 14.4 mg/dL (ref 7.0–26.0)
CALCIUM: 9.9 mg/dL (ref 8.4–10.4)
CHLORIDE: 108 meq/L (ref 98–109)
CO2: 24 mEq/L (ref 22–29)
Creatinine: 0.7 mg/dL (ref 0.6–1.1)
EGFR: 85 mL/min/{1.73_m2} — ABNORMAL LOW (ref >90)
Glucose: 108 mg/dl (ref 70–140)
POTASSIUM: 4 meq/L (ref 3.5–5.1)
Sodium: 139 mEq/L (ref 136–145)
Total Bilirubin: 0.52 mg/dL (ref 0.20–1.20)
Total Protein: 7 g/dL (ref 6.4–8.3)

## 2015-10-10 LAB — CBC WITH DIFFERENTIAL (CANCER CENTER ONLY)
BASO#: 0 10*3/uL (ref 0.0–0.2)
BASO%: 0.3 % (ref 0.0–2.0)
EOS%: 1.3 % (ref 0.0–7.0)
Eosinophils Absolute: 0.1 10*3/uL (ref 0.0–0.5)
HEMATOCRIT: 40.8 % (ref 34.8–46.6)
HGB: 14.1 g/dL (ref 11.6–15.9)
LYMPH#: 2.4 10*3/uL (ref 0.9–3.3)
LYMPH%: 33.8 % (ref 14.0–48.0)
MCH: 33.3 pg (ref 26.0–34.0)
MCHC: 34.6 g/dL (ref 32.0–36.0)
MCV: 97 fL (ref 81–101)
MONO#: 0.7 10*3/uL (ref 0.1–0.9)
MONO%: 9.6 % (ref 0.0–13.0)
NEUT#: 3.8 10*3/uL (ref 1.5–6.5)
NEUT%: 55 % (ref 39.6–80.0)
Platelets: 208 10*3/uL (ref 145–400)
RBC: 4.23 10*6/uL (ref 3.70–5.32)
RDW: 12.8 % (ref 11.1–15.7)
WBC: 7 10*3/uL (ref 3.9–10.0)

## 2015-10-11 LAB — IRON AND TIBC
%SAT: 61 % — ABNORMAL HIGH (ref 21–57)
IRON: 152 ug/dL — AB (ref 41–142)
TIBC: 249 ug/dL (ref 236–444)
UIBC: 97 ug/dL — ABNORMAL LOW (ref 120–384)

## 2015-10-11 LAB — FERRITIN: Ferritin: 32 ng/ml (ref 9–269)

## 2015-10-12 ENCOUNTER — Telehealth: Payer: Self-pay | Admitting: *Deleted

## 2015-10-12 NOTE — Telephone Encounter (Addendum)
Patient aware of results.   ----- Message from Volanda Napoleon, MD sent at 10/11/2015  6:10 PM EDT ----- Call - iron level is much better!!!  No need for phlebotomy!! Lori [name]

## 2015-11-07 ENCOUNTER — Other Ambulatory Visit: Payer: BLUE CROSS/BLUE SHIELD

## 2015-11-07 ENCOUNTER — Ambulatory Visit: Payer: BLUE CROSS/BLUE SHIELD | Admitting: Hematology & Oncology

## 2015-11-07 ENCOUNTER — Ambulatory Visit: Payer: BLUE CROSS/BLUE SHIELD

## 2015-12-27 ENCOUNTER — Ambulatory Visit: Payer: BLUE CROSS/BLUE SHIELD | Admitting: Family

## 2015-12-27 ENCOUNTER — Other Ambulatory Visit: Payer: BLUE CROSS/BLUE SHIELD

## 2016-02-26 DIAGNOSIS — J300 Vasomotor rhinitis: Secondary | ICD-10-CM | POA: Insufficient documentation

## 2016-02-26 DIAGNOSIS — J3089 Other allergic rhinitis: Secondary | ICD-10-CM | POA: Insufficient documentation

## 2016-02-26 DIAGNOSIS — R059 Cough, unspecified: Secondary | ICD-10-CM | POA: Insufficient documentation

## 2016-02-26 DIAGNOSIS — R05 Cough: Secondary | ICD-10-CM | POA: Insufficient documentation

## 2016-02-27 ENCOUNTER — Encounter: Payer: Self-pay | Admitting: Family

## 2016-02-27 ENCOUNTER — Other Ambulatory Visit (HOSPITAL_BASED_OUTPATIENT_CLINIC_OR_DEPARTMENT_OTHER): Payer: BLUE CROSS/BLUE SHIELD

## 2016-02-27 ENCOUNTER — Ambulatory Visit (HOSPITAL_BASED_OUTPATIENT_CLINIC_OR_DEPARTMENT_OTHER): Payer: BLUE CROSS/BLUE SHIELD

## 2016-02-27 ENCOUNTER — Ambulatory Visit (HOSPITAL_BASED_OUTPATIENT_CLINIC_OR_DEPARTMENT_OTHER): Payer: BLUE CROSS/BLUE SHIELD | Admitting: Family

## 2016-02-27 VITALS — BP 113/48 | HR 83 | Temp 98.0°F | Resp 16 | Ht 66.0 in | Wt 158.0 lb

## 2016-02-27 DIAGNOSIS — M79622 Pain in left upper arm: Secondary | ICD-10-CM

## 2016-02-27 LAB — CMP (CANCER CENTER ONLY)
ALT(SGPT): 34 U/L (ref 10–47)
AST: 32 U/L (ref 11–38)
Albumin: 3.7 g/dL (ref 3.3–5.5)
Alkaline Phosphatase: 68 U/L (ref 26–84)
BUN: 11 mg/dL (ref 7–22)
CHLORIDE: 105 meq/L (ref 98–108)
CO2: 27 meq/L (ref 18–33)
CREATININE: 0.6 mg/dL (ref 0.6–1.2)
Calcium: 9.5 mg/dL (ref 8.0–10.3)
GLUCOSE: 92 mg/dL (ref 73–118)
POTASSIUM: 4.3 meq/L (ref 3.3–4.7)
SODIUM: 136 meq/L (ref 128–145)
Total Bilirubin: 0.9 mg/dl (ref 0.20–1.60)
Total Protein: 6.6 g/dL (ref 6.4–8.1)

## 2016-02-27 LAB — CBC WITH DIFFERENTIAL (CANCER CENTER ONLY)
BASO#: 0 10*3/uL (ref 0.0–0.2)
BASO%: 0.4 % (ref 0.0–2.0)
EOS%: 1 % (ref 0.0–7.0)
Eosinophils Absolute: 0.1 10*3/uL (ref 0.0–0.5)
HCT: 42.3 % (ref 34.8–46.6)
HGB: 14.7 g/dL (ref 11.6–15.9)
LYMPH#: 2.9 10*3/uL (ref 0.9–3.3)
LYMPH%: 41.6 % (ref 14.0–48.0)
MCH: 33 pg (ref 26.0–34.0)
MCHC: 34.8 g/dL (ref 32.0–36.0)
MCV: 95 fL (ref 81–101)
MONO#: 0.7 10*3/uL (ref 0.1–0.9)
MONO%: 9.8 % (ref 0.0–13.0)
NEUT#: 3.2 10*3/uL (ref 1.5–6.5)
NEUT%: 47.2 % (ref 39.6–80.0)
Platelets: 224 10*3/uL (ref 145–400)
RBC: 4.46 10*6/uL (ref 3.70–5.32)
RDW: 13.3 % (ref 11.1–15.7)
WBC: 6.9 10*3/uL (ref 3.9–10.0)

## 2016-02-27 LAB — IRON AND TIBC
%SAT: 86 % — ABNORMAL HIGH (ref 21–57)
Iron: 207 ug/dL — ABNORMAL HIGH (ref 41–142)
TIBC: 240 ug/dL (ref 236–444)
UIBC: 33 ug/dL — ABNORMAL LOW (ref 120–384)

## 2016-02-27 LAB — FERRITIN: Ferritin: 37 ng/ml (ref 9–269)

## 2016-02-27 NOTE — Patient Instructions (Addendum)
Therapeutic Phlebotomy, Care After  Refer to this sheet in the next few weeks. These instructions provide you with information about caring for yourself after your procedure. Your health care provider may also give you more specific instructions. Your treatment has been planned according to current medical practices, but problems sometimes occur. Call your health care provider if you have any problems or questions after your procedure.  WHAT TO EXPECT AFTER THE PROCEDURE  After your procedure, it is common to have:   Light-headedness or dizziness. You may feel faint.   Nausea.   Tiredness.  HOME CARE INSTRUCTIONS  Activities   Return to your normal activities as directed by your health care provider. Most people can go back to their normal activities right away.   Avoid strenuous physical activity and heavy lifting or pulling for about 5 hours after the procedure. Do not lift anything that is heavier than 10 lb (4.5 kg).   Athletes should avoid strenuous exercise for at least 12 hours.   Change positions slowly for the remainder of the day. This will help to prevent light-headedness or fainting.   If you feel light-headed, lie down until the feeling goes away.  Eating and Drinking   Be sure to eat well-balanced meals for the next 24 hours.   Drink enough fluid to keep your urine clear or pale yellow.   Avoid drinking alcohol on the day that you had the procedure.  Care of the Needle Insertion Site   Keep your bandage dry. You can remove the bandage after about 5 hours or as directed by your health care provider.   If you have bleeding from the needle insertion site, elevate your arm and press firmly on the site until the bleeding stops.   If you have bruising at the site, apply ice to the area:   Put ice in a plastic bag.   Place a towel between your skin and the bag.   Leave the ice on for 20 minutes, 2-3 times a day for the first 24 hours.   If the swelling does not go away after 24 hours, apply  a warm, moist washcloth to the area for 20 minutes, 2-3 times a day.  General Instructions   Avoid smoking for at least 30 minutes after the procedure.   Keep all follow-up visits as directed by your health care provider. It is important to continue with further therapeutic phlebotomy treatments as directed.  SEEK MEDICAL CARE IF:   You have redness, swelling, or pain at the needle insertion site.   You have fluid, blood, or pus coming from the needle insertion site.   You feel light-headed, dizzy, or nauseated, and the feeling does not go away.   You notice new bruising at the needle insertion site.   You feel weaker than normal.   You have a fever or chills.  SEEK IMMEDIATE MEDICAL CARE IF:   You have severe nausea or vomiting.   You have chest pain.   You have trouble breathing.    This information is not intended to replace advice given to you by your health care provider. Make sure you discuss any questions you have with your health care provider.    Document Released: 10/14/2010 Document Revised: 09/26/2014 Document Reviewed: 05/08/2014  Elsevier Interactive Patient Education 2016 Elsevier Inc.

## 2016-02-27 NOTE — Progress Notes (Signed)
Hematology and Oncology Follow Up Visit  Lori [name] [id number] 1945/11/11 70 y.o. 02/27/2016   Principle Diagnosis:  Hemochromatosis - heterozygous for the [id number] mutation   Current Therapy:   Phlebotomy as indicated to keep ferritin < 50    Interim History:  Ms. Meddock is here today for a follow-up. She recently had lab work drawn by her PCP and her ferritin was 48 with a total iron of 246. She is symptomatic with "brain fog", blurred vision, SOB with exertion and a metallic taste in her mouth. She states that she can tell it is time for a phlebotomy.  No fever, chills, n/v, cough, rash, chest pain, abdominal pain or changes in bowel or bladder habits.  She takes vitamin K2 she states for issues with her clotting factors. Nurse noted blood was difficult to flow out during phlebotomy. Ginger, RN discussed with patient her stopping the Vitamin K2 but patient prefers to remain taking.  She still has occasional palpitations and takes Toprol-XL daily.  On exam she had bilateral axillary tenderness and was guarded. Her last mammogram was in December of last year and was negative. She has numbness and tingling in her hands that comes and goes. No swelling or tenderness in her extremities. No c/o joint aches or pains.  Her appetite is ok and she is staying well hydrated. Her weight is unchanged.    Medications:    Medication List       Accurate as of 02/27/16 11:36 AM. Always use your most recent med list.          CO Q 10 PO Take 1 tablet by mouth daily.   dextroamphetamine 5 MG 24 hr capsule Commonly known as:  DEXEDRINE SPANSULE Take 5 mg by mouth daily.   dextroamphetamine 5 MG tablet Commonly known as:  DEXTROSTAT Take 5 mg by mouth daily.   fish oil-omega-3 fatty acids 1000 MG capsule Take 1 g by mouth daily.   GLUCOSAMINE PO Take 1 tablet by mouth 2 (two) times daily.   GLUTAMINE PO Take 1 tablet by mouth 2 (two) times daily.   LYSINE PO Take 1 tablet by  mouth 3 (three) times a week.   MAGNESIUM PO Take 1 tablet by mouth daily.   MELATONIN PO Take 1 tablet by mouth at bedtime.   metoprolol succinate 25 MG 24 hr tablet Commonly known as:  TOPROL-XL TAKE 1 TABLET IN AM AND 1/2 TABLET IN THE PM.   multivitamin tablet Take 1 tablet by mouth daily.   NATURE-THROID 97.5 MG Tabs Generic drug:  Thyroid Take 1 tablet by mouth daily.   NONFORMULARY OR COMPOUNDED ITEM Boric Acid 600 mg caps. S: insert vaginally twice daily x 2 weeks then twice weekly to complete 3 mos of treatment.   ondansetron 4 MG disintegrating tablet Commonly known as:  ZOFRAN ODT 4mg  ODT q4 hours prn nausea/vomit   potassium chloride SA 20 MEQ tablet Commonly known as:  K-DUR,KLOR-CON Take 1 tablet by mouth daily.   Prasterone (DHEA) 10 MG Caps Take 7.5 mg by mouth daily.   PROBIOTIC DAILY PO Take 1 tablet by mouth 2 (two) times daily.   progesterone 200 MG capsule Commonly known as:  PROMETRIUM Take 200 mg by mouth daily.   sulfamethoxazole-trimethoprim 800-160 MG tablet Commonly known as:  BACTRIM DS,SEPTRA DS   VITAMIN D PO Take 1 tablet by mouth daily.       Allergies:  Allergies  Allergen Reactions  . Albumin (Human)   .  Amoxicillin Nausea And Vomiting  . Codeine   . Erythromycin   . Keflex [Cephalexin] Itching  . Meperidine   . Metoclopramide Hcl   . Other Other (See Comments)    Beef Chicken Dairy Eggs  . Shellfish-Derived Products   . Oxycodone Nausea And Vomiting    Past Medical History, Surgical history, Social history, and Family History were reviewed and updated.  Review of Systems: All other 10 point review of systems is negative.   Physical Exam:  vitals were not taken for this visit.  Wt Readings from Last 3 Encounters:  08/27/15 149 lb (67.6 kg)  06/27/15 149 lb (67.6 kg)  04/16/15 151 lb 5 oz (68.6 kg)    Ocular: Sclerae unicteric, pupils equal, round and reactive to light Ear-nose-throat: Oropharynx  clear, dentition fair Lymphatic: No cervical supraclavicular or axillary adenopathy Lungs no rales or rhonchi, good excursion bilaterally Heart regular rate and rhythm, no murmur appreciated Abd soft, nontender, positive bowel sounds, no liver or spleen tip palpated on exam, no fluid wave MSK no focal spinal tenderness, no joint edema Neuro: non-focal, well-oriented, appropriate affect Breasts: Deferred  Lab Results  Component Value Date   WBC 6.9 02/27/2016   HGB 14.7 02/27/2016   HCT 42.3 02/27/2016   MCV 95 02/27/2016   PLT 224 02/27/2016   Lab Results  Component Value Date   FERRITIN 32 10/10/2015   IRON 152 (H) 10/10/2015   TIBC 249 10/10/2015   UIBC 97 (L) 10/10/2015   IRONPCTSAT 61 (H) 10/10/2015   Lab Results  Component Value Date   RBC 4.46 02/27/2016   No results found for: KPAFRELGTCHN, LAMBDASER, KAPLAMBRATIO No results found for: IGGSERUM, IGA, IGMSERUM No results found for: Kathrynn Ducking, MSPIKE, SPEI   Chemistry      Component Value Date/Time   NA 139 10/10/2015 1204   K 4.0 10/10/2015 1204   CL 105 04/16/2015 2159   CL 103 08/16/2014 1212   CO2 24 10/10/2015 1204   BUN 14.4 10/10/2015 1204   CREATININE 0.7 10/10/2015 1204      Component Value Date/Time   CALCIUM 9.9 10/10/2015 1204   ALKPHOS 70 10/10/2015 1204   AST 32 10/10/2015 1204   ALT 30 10/10/2015 1204   BILITOT 0.52 10/10/2015 1204     Impression and Plan: Ms. Gurka is a pleasant 70 yo white female with a history of hemochromocytosis - heterozygous for the Cys282Tyr. She is symptomatic at this time with "brain fog", blurred vision, SOB with exertion and a metallic taste in her mouth. She is requesting a phlebotomy at this time.  We will go ahead and phlebotomize her today per her request. Her ferritin was 48 on 01/30/16. She states that she does no require fluids after phlebotomy.  Addendum: Ferritin is now 37. She will not need a second  phlebotomy at this time. We will also get bilateral axillary [place] to assess for cause of tenderness.   We will plan to see her back in 4 months for repeat lab work and follow-up.  She will contact our office with any questions or concerns. We can certainly see her sooner if need be.    Eliezer Bottom, NP 10/4/201711:36 AM

## 2016-02-27 NOTE — Progress Notes (Signed)
Lori [name] presents today for phlebotomy per MD orders. Phlebotomy procedure started at 1258 and ended at 1304. Initially attempted with 20G but needle clotted, switched to 16G with good results. Approximately 500 mls removed. Patient observed for 30 minutes after procedure without any incident. Patient tolerated procedure well. IV needle removed intact.

## 2016-03-07 ENCOUNTER — Encounter: Payer: Self-pay | Admitting: Hematology & Oncology

## 2016-03-24 ENCOUNTER — Other Ambulatory Visit: Payer: Self-pay | Admitting: Family

## 2016-03-24 DIAGNOSIS — Z452 Encounter for adjustment and management of vascular access device: Secondary | ICD-10-CM

## 2016-04-03 DIAGNOSIS — D110 Benign neoplasm of parotid gland: Secondary | ICD-10-CM

## 2016-04-03 DIAGNOSIS — K118 Other diseases of salivary glands: Secondary | ICD-10-CM | POA: Insufficient documentation

## 2016-04-21 ENCOUNTER — Other Ambulatory Visit: Payer: Self-pay | Admitting: Gynecology

## 2016-04-21 DIAGNOSIS — Z1231 Encounter for screening mammogram for malignant neoplasm of breast: Secondary | ICD-10-CM

## 2016-04-30 ENCOUNTER — Ambulatory Visit: Payer: BLUE CROSS/BLUE SHIELD

## 2016-04-30 ENCOUNTER — Other Ambulatory Visit (HOSPITAL_BASED_OUTPATIENT_CLINIC_OR_DEPARTMENT_OTHER): Payer: BLUE CROSS/BLUE SHIELD

## 2016-04-30 LAB — CBC WITH DIFFERENTIAL (CANCER CENTER ONLY)
BASO#: 0 10*3/uL (ref 0.0–0.2)
BASO%: 0.1 % (ref 0.0–2.0)
EOS%: 0.6 % (ref 0.0–7.0)
Eosinophils Absolute: 0.1 10*3/uL (ref 0.0–0.5)
HEMATOCRIT: 45.3 % (ref 34.8–46.6)
HGB: 15.7 g/dL (ref 11.6–15.9)
LYMPH#: 2.8 10*3/uL (ref 0.9–3.3)
LYMPH%: 25.4 % (ref 14.0–48.0)
MCH: 32.5 pg (ref 26.0–34.0)
MCHC: 34.7 g/dL (ref 32.0–36.0)
MCV: 94 fL (ref 81–101)
MONO#: 0.8 10*3/uL (ref 0.1–0.9)
MONO%: 7 % (ref 0.0–13.0)
NEUT#: 7.4 10*3/uL — ABNORMAL HIGH (ref 1.5–6.5)
NEUT%: 66.9 % (ref 39.6–80.0)
PLATELETS: 166 10*3/uL (ref 145–400)
RBC: 4.83 10*6/uL (ref 3.70–5.32)
RDW: 12 % (ref 11.1–15.7)
WBC: 11 10*3/uL — ABNORMAL HIGH (ref 3.9–10.0)

## 2016-04-30 LAB — COMPREHENSIVE METABOLIC PANEL
ALT: 34 U/L (ref 0–55)
AST: 35 U/L — AB (ref 5–34)
Albumin: 4 g/dL (ref 3.5–5.0)
Alkaline Phosphatase: 100 U/L (ref 40–150)
Anion Gap: 10 mEq/L (ref 3–11)
BUN: 14.5 mg/dL (ref 7.0–26.0)
CHLORIDE: 103 meq/L (ref 98–109)
CO2: 25 meq/L (ref 22–29)
Calcium: 10.3 mg/dL (ref 8.4–10.4)
Creatinine: 0.7 mg/dL (ref 0.6–1.1)
EGFR: 82 mL/min/{1.73_m2} — AB (ref >90)
GLUCOSE: 95 mg/dL (ref 70–140)
POTASSIUM: 4.5 meq/L (ref 3.5–5.1)
SODIUM: 138 meq/L (ref 136–145)
Total Bilirubin: 0.58 mg/dL (ref 0.20–1.20)
Total Protein: 7.7 g/dL (ref 6.4–8.3)

## 2016-04-30 LAB — TECHNOLOGIST REVIEW CHCC SATELLITE

## 2016-05-01 ENCOUNTER — Telehealth: Payer: Self-pay | Admitting: *Deleted

## 2016-05-01 LAB — IRON AND TIBC
%SAT: 52 % (ref 21–57)
Iron: 147 ug/dL — ABNORMAL HIGH (ref 41–142)
TIBC: 283 ug/dL (ref 236–444)
UIBC: 135 ug/dL (ref 120–384)

## 2016-05-01 LAB — FERRITIN: FERRITIN: 47 ng/mL (ref 9–269)

## 2016-05-01 NOTE — Telephone Encounter (Addendum)
Patient aware of results  ----- Message from Volanda Napoleon, MD sent at 05/01/2016 11:15 AM EST ----- Call - iron levels are ok!!  pete

## 2016-05-14 ENCOUNTER — Ambulatory Visit: Payer: BLUE CROSS/BLUE SHIELD

## 2016-05-14 ENCOUNTER — Ambulatory Visit (HOSPITAL_BASED_OUTPATIENT_CLINIC_OR_DEPARTMENT_OTHER): Payer: BLUE CROSS/BLUE SHIELD

## 2016-05-14 ENCOUNTER — Other Ambulatory Visit: Payer: BLUE CROSS/BLUE SHIELD

## 2016-05-14 ENCOUNTER — Other Ambulatory Visit: Payer: Self-pay | Admitting: Family

## 2016-05-14 VITALS — BP 114/46 | HR 52 | Temp 98.0°F | Resp 17

## 2016-05-14 DIAGNOSIS — M810 Age-related osteoporosis without current pathological fracture: Secondary | ICD-10-CM | POA: Diagnosis not present

## 2016-05-14 DIAGNOSIS — Z452 Encounter for adjustment and management of vascular access device: Secondary | ICD-10-CM

## 2016-05-14 MED ORDER — ZOLEDRONIC ACID 4 MG/100ML IV SOLN
4.0000 mg | Freq: Once | INTRAVENOUS | Status: AC
  Administered 2016-05-14: 4 mg via INTRAVENOUS
  Filled 2016-05-14: qty 100

## 2016-05-14 MED ORDER — SODIUM CHLORIDE 0.9 % IV SOLN
Freq: Once | INTRAVENOUS | Status: AC
  Administered 2016-05-14: 15:00:00 via INTRAVENOUS

## 2016-05-14 NOTE — Patient Instructions (Signed)

## 2016-05-15 ENCOUNTER — Other Ambulatory Visit: Payer: Self-pay | Admitting: Family

## 2016-05-15 ENCOUNTER — Encounter: Payer: Self-pay | Admitting: Hematology & Oncology

## 2016-05-15 NOTE — Progress Notes (Signed)
I spoke with Lori [name] yesterday for over an hour discussing the benefits and possible side effects of Zometa per Up to Date. She verbalized understanding. She was quite apprehensive and I emphasized more than once during our conversation that if she was not comfortable with this she did not have to get it. She did decide to have the infusion. She tolerated this nicely and will contact our office with any new questions or concerns.

## 2016-05-26 HISTORY — PX: COLONOSCOPY W/ POLYPECTOMY: SHX1380

## 2016-05-27 ENCOUNTER — Other Ambulatory Visit: Payer: Self-pay | Admitting: Cardiovascular Disease

## 2016-05-27 NOTE — Telephone Encounter (Signed)
Rx request sent to pharmacy.  

## 2016-05-29 ENCOUNTER — Other Ambulatory Visit: Payer: Self-pay | Admitting: Cardiovascular Disease

## 2016-06-03 ENCOUNTER — Ambulatory Visit: Payer: BLUE CROSS/BLUE SHIELD

## 2016-06-10 ENCOUNTER — Ambulatory Visit
Admission: RE | Admit: 2016-06-10 | Discharge: 2016-06-10 | Disposition: A | Discharge status: Home / Self Care | Payer: BLUE CROSS/BLUE SHIELD | Source: Ambulatory Visit | Attending: Gynecology | Admitting: Gynecology

## 2016-06-10 DIAGNOSIS — Z1231 Encounter for screening mammogram for malignant neoplasm of breast: Secondary | ICD-10-CM

## 2016-06-26 ENCOUNTER — Ambulatory Visit (INDEPENDENT_AMBULATORY_CARE_PROVIDER_SITE_OTHER): Payer: BLUE CROSS/BLUE SHIELD | Admitting: Cardiovascular Disease

## 2016-06-26 ENCOUNTER — Encounter: Payer: Self-pay | Admitting: Podiatry

## 2016-06-26 ENCOUNTER — Ambulatory Visit (INDEPENDENT_AMBULATORY_CARE_PROVIDER_SITE_OTHER): Payer: BLUE CROSS/BLUE SHIELD | Admitting: Podiatry

## 2016-06-26 ENCOUNTER — Encounter: Payer: Self-pay | Admitting: Cardiovascular Disease

## 2016-06-26 ENCOUNTER — Ambulatory Visit (INDEPENDENT_AMBULATORY_CARE_PROVIDER_SITE_OTHER): Payer: BLUE CROSS/BLUE SHIELD

## 2016-06-26 VITALS — BP 108/74 | HR 79 | Ht 67.0 in | Wt 165.0 lb

## 2016-06-26 DIAGNOSIS — I498 Other specified cardiac arrhythmias: Secondary | ICD-10-CM

## 2016-06-26 DIAGNOSIS — M7752 Other enthesopathy of left foot: Secondary | ICD-10-CM

## 2016-06-26 DIAGNOSIS — D3610 Benign neoplasm of peripheral nerves and autonomic nervous system, unspecified: Secondary | ICD-10-CM

## 2016-06-26 DIAGNOSIS — L84 Corns and callosities: Secondary | ICD-10-CM

## 2016-06-26 DIAGNOSIS — R002 Palpitations: Secondary | ICD-10-CM | POA: Diagnosis not present

## 2016-06-26 DIAGNOSIS — M351 Other overlap syndromes: Secondary | ICD-10-CM

## 2016-06-26 DIAGNOSIS — M779 Enthesopathy, unspecified: Secondary | ICD-10-CM

## 2016-06-26 DIAGNOSIS — M79672 Pain in left foot: Secondary | ICD-10-CM

## 2016-06-26 DIAGNOSIS — M778 Other enthesopathies, not elsewhere classified: Secondary | ICD-10-CM

## 2016-06-26 MED ORDER — TRIAMCINOLONE ACETONIDE 10 MG/ML IJ SUSP
10.0000 mg | Freq: Once | INTRAMUSCULAR | Status: AC
  Administered 2016-06-26: 10 mg

## 2016-06-26 NOTE — Progress Notes (Signed)
Patient ID: Lori [name], female   DOB: Oct 08, 1945, 71 y.o.   MRN: [id number]     HPI: Lori [name] is a former patient of Dr. Rollene Fare.  She presented for a 15 month follow-up evaluation.  Lori [name] has a history of episodic palpitations in the past has been demonstrated to have a PACs with short runs of atrial tachycardia. She  has a history of irritable bowel syndrome, chronic fatigue, hypothyroidism.  She has had issues with bladder infections, and underwent bladder tacking surgery in July. She sees Dr. Christena Deem of urology in [facility].  In 2012 an echo Doppler study showed mild concentric left ventricular hypertrophy with normal systolic and diastolic function. There was trivial mitral and tricuspid regurgitation. She previously has had a normal Myoview scan in 2013 she also had carotid studies which essentially were normal.   She sees Dr. Sid Falcon of [place] frequently and is on numerous vitamin and supplement medications.She does take numerous herbal type medications as well as vitamins.  She states she was told of having possible low adrenal function.  She denies any recent episodes of chest pain. There are no episodes of presyncope or syncope.  Since I last saw her 15 months ago, she has felt well from a cardiovascular standpoint.  She does note an occasional palpitation but her current dose of metoprolol 37.5 mg has controlled this most of the time.  At times she has taken an extra 12.5 mg.  She states she has had autoimmune disease with mixed connective tissue disease, autoimmune hepatitis, rheumatoid arthritis, and lupus., hemachromatosis and undergoes periodic phlebotomy by Dr. Marin Olp.  She states several family members has venous disease.  Time.  She notes occasional cramping of her legs at night.  She denies exertional claudication symptoms.  She denies varicose veins.  She denies any episodes of chest pain.  She notes mild shortness of breath when she bends  over.  There is some very mild shortness of breath with activity, which has not changed significantly.  She has been undergoing blood work by Dr. Sid Falcon dose every 3 months and does plaque X treatments.  She presents for evaluation.  Past Medical History:  Diagnosis Date  . Chronic fatigue   . Hormone disorder   . Hypothyroidism   . IBS (irritable bowel syndrome)   . Lupus   . Osteoporosis   . Osteoporosis   . Osteoporosis 09/05/2015  . Palpitations   . Rheumatoid arthritis [facility])     Past Surgical History:  Procedure Laterality Date  . ABDOMINAL SURGERY     ABDOMINOPLASTY  . BLADDER SUSPENSION    . BREAST SURGERY  1980   LUMPECTOMY X 2 FROM RIGHT BREAST  . CARDIOVASCULAR STRESS TEST  08/28/2011   Normal  . CAROTID DOPPLER  08/28/2011   Normal, no evidence of significant diameter reduction, dissection, or vascular abnormality  . EYE SURGERY    . LAPAROSCOPIC CHOLECYSTECTOMY    . TONSILLECTOMY AND ADENOIDECTOMY    . TRANSTHORACIC ECHOCARDIOGRAM  01/29/2011   EF >55%, mild concentric LVH  . TUBAL LIGATION      Allergies  Allergen Reactions  . Albumin (Human)   . Amoxicillin Nausea And Vomiting  . Codeine   . Erythromycin   . Keflex [Cephalexin] Itching  . Meperidine   . Metoclopramide Hcl   . Other Other (See Comments)    Beef Chicken Dairy Eggs  . Shellfish-Derived Products   . Oxycodone Nausea And Vomiting    Current  Outpatient Prescriptions  Medication Sig Dispense Refill  . Ascorbic Acid (VITAMIN C) 1000 MG tablet Take 1,000 mg by mouth.    . Biotin 5 MG CAPS Take 5 mg by mouth.    . Boron Amorphous Fine POWD Take by mouth.    . Cholecalciferol (VITAMIN D PO) Take 1 tablet by mouth daily.    . Coenzyme Q10 (CO Q 10 PO) Take 1 tablet by mouth daily.    Marland Kitchen dextroamphetamine (DEXEDRINE SPANSULE) 5 MG 24 hr capsule Take 5 mg by mouth daily.  0  . dextroamphetamine (DEXTROSTAT) 5 MG tablet Take 5 mg by mouth daily.    . fish oil-omega-3 fatty acids 1000 MG  capsule Take 1 g by mouth daily.    . fluconazole (DIFLUCAN) 200 MG tablet Take 100 mg by mouth.    Marland Kitchen GLUTAMINE PO Take 1 tablet by mouth 2 (two) times daily.    Marland Kitchen LYSINE PO Take 1 tablet by mouth 3 (three) times a week.    Marland Kitchen MAGNESIUM PO Take 1 tablet by mouth daily.    Marland Kitchen MELATONIN PO Take 2 mg by mouth 2 (two) times daily.     . Menaquinone-7 (VITAMIN K2 PO) Take by mouth once.    . metoprolol succinate (TOPROL-XL) 25 MG 24 hr tablet TAKE 1 TABLET IN AM AND 1/2 TABLET IN THE PM. 45 tablet 6  . Multiple Vitamin (MULTIVITAMIN) tablet Take 1 tablet by mouth daily.    . NON FORMULARY Bio-est Cream    . NON FORMULARY Pregnolol cap    . NONFORMULARY OR COMPOUNDED ITEM Boric Acid 600 mg caps. S: insert vaginally twice daily x 2 weeks then twice weekly to complete 3 mos of treatment. 60 each 1  . potassium chloride SA (K-DUR,KLOR-CON) 20 MEQ tablet Take 1 tablet by mouth daily.    . Prasterone, DHEA, 10 MG CAPS Take 7.5 mg by mouth daily.    . Probiotic Product (PROBIOTIC DAILY PO) Take 1 tablet by mouth 2 (two) times daily.    . progesterone (PROMETRIUM) 200 MG capsule Take 200 mg by mouth daily.    . Thyroid (NATURE-THROID) 97.5 MG TABS Take 1 tablet by mouth daily.     No current facility-administered medications for this visit.     Socially History is notable in that she is married for 43 years. She has 3 children and 2 grandchildren. She completed college. She does drink occasional wine. There is no tobacco history. She does walk occasionally.   Family History  Problem Relation Age of Onset  . Seizures Mother   . Transient ischemic attack Mother   . Cancer Father 34    COLON  . Diabetes Father   . Heart disease Father   . Breast cancer Maternal Aunt   . Heart disease Paternal Uncle   . Breast cancer Maternal Aunt     ROS General: Negative; No fevers, chills, or night sweats; positive for chronic fatigue HEENT: Negative; No changes in vision or hearing, sinus congestion,  difficulty swallowing Pulmonary: Negative; No cough, wheezing, shortness of breath, hemoptysis Cardiovascular: Negative; No chest pain, presyncope, syncope, palpitations GI: Nausea for irritable bowel syndrome No nausea, vomiting, diarrhea, or abdominal pain GU: Negative; No dysuria, hematuria, or difficulty voiding Musculoskeletal: Negative; no myalgias, joint pain, or weakness Rheumatologic: Reported history of lupus and rheumatoid disease. Hematologic/Oncology: Positive for hemachromatosis for which she undergoes phlebotomy Endocrine: Negative; no heat/cold intolerance; no diabetes Neuro: Negative; no changes in balance, headaches Skin: Negative; No rashes  or skin lesions Psychiatric: Negative; No behavioral problems, depression Sleep: Negative; No snoring, daytime sleepiness, hypersomnolence, bruxism, restless legs, hypnogognic hallucinations, no cataplexy Other comprehensive 14 point system review is negative.   PE BP 108/74   Pulse 79   Ht 5' 7"  (1.702 m)   Wt 165 lb (74.8 kg)   BMI 25.84 kg/m    Repeat blood pressure by me was 122/76  Wt Readings from Last 3 Encounters:  06/26/16 165 lb (74.8 kg)  02/27/16 158 lb (71.7 kg)  08/27/15 149 lb (67.6 kg)   General: Alert, oriented, no distress.  Skin: normal turgor, no rashes HEENT: Normocephalic, atraumatic. Pupils round and reactive; sclera anicteric; Fundi without hemorrhages or exudates. Nose without nasal septal hypertrophy Mouth/Parynx benign; Mallinpatti scale 2 Neck: No JVD, no carotid bruits with normal carotid upstroke Lungs: clear to ausculatation and percussion; no wheezing or rales Heart: RRR, s1 s2 normal faint 1/6 systolic murmur; no diastolic murmur; no rubs thrills or heaves No chest wall tenderness to palpation Abdomen: soft, nontender; no hepatosplenomehaly, BS+; abdominal aorta nontender and not dilated by palpation. Pulses 2+ Extremities: Mild thickening of the second and third MCP of both hands; no  clubbing cyanosis or edema, Homan's sign negative  .  No varicose veins. Neurologic: grossly nonfocal; no pathologic reflex Psychologic: Normal mood and affect  ECG (independently read by me): Normal sinus rhythm with PACs and mild sinus arrhythmia.  QTc interval 442 ms.  November 2016 ECG (independently read by me): ECG reveals sinus rhythm with first-degree AV block with PR interval 216 only seconds. Occasional PACs.  Nonspecific ST changes.  November 2015 ECG (independently read by me): Sinus rhythm with sinus arrhythmia and PACs and transient atrial bigeminal like pattern  Prior November 2014  ECG: Sinus rhythm with PAC. QTc interval 442 ms. PR interval 200 ms  LABS: BMP Latest Ref Rng & Units 04/30/2016 02/27/2016 10/10/2015  Glucose 70 - 140 mg/dl 95 92 108  BUN 7.0 - 26.0 mg/dL 14.5 11 14.4  Creatinine 0.6 - 1.1 mg/dL 0.7 0.6 0.7  Sodium 136 - 145 mEq/L 138 136 139  Potassium 3.5 - 5.1 mEq/L 4.5 4.3 4.0  Chloride 98 - 108 mEq/L - 105 -  CO2 22 - 29 mEq/L 25 27 24   Calcium 8.4 - 10.4 mg/dL 10.3 9.5 9.9   Hepatic Function Latest Ref Rng & Units 04/30/2016 02/27/2016 10/10/2015  Total Protein 6.4 - 8.3 g/dL 7.7 6.6 7.0  Albumin 3.5 - 5.0 g/dL 4.0 3.7 4.1  AST 5 - 34 U/L 35(H) 32 32  ALT 0 - 55 U/L 34 34 30  Alk Phosphatase 40 - 150 U/L 100 68 70  Total Bilirubin 0.20 - 1.20 mg/dL 0.58 0.90 0.52  Bilirubin, Direct 0.0 - 0.3 mg/dL - - -   CBC Latest Ref Rng & Units 04/30/2016 02/27/2016 10/10/2015  WBC 3.9 - 10.0 10e3/uL 11.0(H) 6.9 7.0  Hemoglobin 11.6 - 15.9 g/dL 15.7 14.7 14.1  Hematocrit 34.8 - 46.6 % 45.3 42.3 40.8  Platelets 145 - 400 10e3/uL 166 224 208   Lab Results  Component Value Date   MCV 94 04/30/2016   MCV 95 02/27/2016   MCV 97 10/10/2015   No results found for: TSH   No results found for: HGBA1C   Lipid Panel     Component Value Date/Time   CHOL (H) 08/18/2008 0250    209        ATP III CLASSIFICATION:  <200     mg/dL  Desirable  200-239  mg/dL    Borderline High  >=240    mg/dL   High          TRIG 87 08/18/2008 0250   HDL 58 08/18/2008 0250   CHOLHDL 3.6 08/18/2008 0250   VLDL 17 08/18/2008 0250   LDLCALC (H) 08/18/2008 0250    134        Total Cholesterol/HDL:CHD Risk Coronary Heart Disease Risk Table                     Men   Women  1/2 Average Risk   3.4   3.3  Average Risk       5.0   4.4  2 X Average Risk   9.6   7.1  3 X Average Risk  23.4   11.0        Use the calculated Patient Ratio above and the CHD Risk Table to determine the patient's CHD Risk.        ATP III CLASSIFICATION (LDL):  <100     mg/dL   Optimal  100-129  mg/dL   Near or Above                    Optimal  130-159  mg/dL   Borderline  160-189  mg/dL   High  >190     mg/dL   Very High     RADIOLOGY: Dg Foot Complete Left  04/17/2013   Multiple view x-rays of left foot indicate no signs of stress fracture  with what appears to be arthritis at the fifth MPJ with narrowing of the  joint surface noted x-ray  Dg Foot Complete Right  04/17/2013   Multiple view x-rays of right foot indicate good alignment with no signs  of stress fracture or arthritis noted  IMPRESSION:  1. Atrial bigeminy   2. Palpitation   3. Mixed connective tissue disease ([place])   4. Hemochromatosis, unspecified hemochromatosis type     ASSESSMENT AND PLAN: Ms. Ladrea Holladay is a 71 year old female who is on multiple vitamins and additional supplemental medications prescribed by Dr. Gerarda Gunther at [place].  She has a history of palpitations and has been taking Toprol-XL.  When I last saw her, I titrated this to 37.5 mg daily and she believes this has made a difference in her awareness of palpitations.  Her ECG today does show an occasional PAC and mild sinus arrhythmia and her resting pulse is 79.  Her blood pressure today is stable.  I answered her many questions.  She was concerned about PVD.  She does not have any femoral bruits.  She does not have any  symptoms suggestive of claudication.  She most likely does not have significant lower extremity venous insufficiency.  She denies any recent swelling.  She does admit to mild shortness of breath which seems to be with bending over.  I suspect this may be due to diaphragm compression.  She has mixed connective tissue disease and scoliosis which may be limiting her lung expansion with bending over.  I discussed the possibility of repeating a 2-D echo Doppler study since her last study was in 2012 to further evaluate to make certain she has not developed any diastolic dysfunction or valvular issues.  She would prefer to defer this until next year.  I reviewed recent blood work.  I will try to obtain laboratory from Dr. Lang Snow, who is her primary physician.  She believes her  lipid studies are excellent.  She continues to see a rheumatologist.  Her blood pressure today is stable.  If she notes more palpitations.  I have suggested she increase Toprol to 50 mrem daily.  I will see her in one year for reevaluation.   Time spent: 30 minutes  Troy Sine, MD, [facility] 06/26/2016 1:07 PM

## 2016-06-26 NOTE — Progress Notes (Signed)
Subjective:     Patient ID: Lori [name], female   DOB: Mar 16, 1946, 71 y.o.   MRN: [id number]  HPI patient presents stating she's had pain in the fourth and fifth digits of her left foot with a lesion on the fourth toe and nerve pain third interspace left that did not respond earlier and she feels like it's crunching. She also feels that the fifth toe itself is giving her problems   Review of Systems  All other systems reviewed and are negative.      Objective:   Physical Exam  Constitutional: She is oriented to person, place, and time.  Cardiovascular: Intact distal pulses.   Musculoskeletal: Normal range of motion.  Neurological: She is oriented to person, place, and time.  Skin: Skin is warm.  Nursing note and vitals reviewed.  neurovascular status intact muscle strength was adequate range of motion within normal limits with patient noted to have exquisite discomfort third interspace left with radiating pain and positive Mulder sign and is noted to have inflammation of the interphalangeal joint digit 4 left with keratotic lesion formation and moderate rotation of the fifth digit against the fourth toe with no corn formation on the fourth toe     Assessment:     Neuroma symptomatology most likely left third interspace along with inflammatory capsulitis fourth digit with keratotic lesion formation and rotated fifth digit    Plan:     H&P conditions reviewed and I did do a nerve block for the third interspace consisting of Marcaine Xylocaine and applied alcohol. I then went ahead did interphalangeal injection of the fourth digit and debrided callus and Morgan to see results in 3 weeks or getting need to consider arthroplasty digits 4 distal fifth and neurectomy third interspace depending on response

## 2016-06-26 NOTE — Patient Instructions (Signed)
Your physician wants you to follow-up in: 1 year or sooner if needed. You will receive a reminder letter in the mail two months in advance. If you don't receive a letter, please call our office to schedule the follow-up appointment.   If you need a refill on your cardiac medications before your next appointment, please call your pharmacy.   

## 2016-06-27 ENCOUNTER — Encounter: Payer: Self-pay | Admitting: Gynecology

## 2016-06-27 ENCOUNTER — Ambulatory Visit (INDEPENDENT_AMBULATORY_CARE_PROVIDER_SITE_OTHER): Payer: BLUE CROSS/BLUE SHIELD | Admitting: Gynecology

## 2016-06-27 VITALS — BP 110/78 | Ht 67.0 in | Wt 159.0 lb

## 2016-06-27 DIAGNOSIS — M810 Age-related osteoporosis without current pathological fracture: Secondary | ICD-10-CM

## 2016-06-27 DIAGNOSIS — Z01411 Encounter for gynecological examination (general) (routine) with abnormal findings: Secondary | ICD-10-CM | POA: Diagnosis not present

## 2016-06-27 NOTE — Addendum Note (Signed)
Addended by: Burnett Kanaris on: 06/27/2016 11:22 AM   Modules accepted: Orders

## 2016-06-27 NOTE — Progress Notes (Signed)
Lori [name] 1945/06/05 [id number]   History:    71 y.o.  for annual gyn exam is asymptomatic. Review of patient's records indicated that she was seen 09/04/2015 to discuss her bone density report which had indicated she was osteoporotic. Her bone density study had demonstrated the following: her lowest T score was at the right femoral neck with a value of -2.6 indicating osteoporosis. Patient's bone mass is 25% below normal with a 8 times greater risk of fracture.  Because of her history of esophageal stricture she cannot take oral bisphosphonate. She does have also mixed connective tissue disease and immunocompromise at times. We discussed different treatment options and Prolia would be contraindicated. Oral bisphosphonate because of her history of gastroesophageal reflux and esophageal stricture she would not be able to tolerate. She was started on Reclast IV in December 2017 and she'll continue received annually for approximate 7 years. Her medical oncologist Dr. Charlesetta Garibaldi has been administering.   Patient with prior history of [facility]. Patient has had a history of laparoscopic BSO in the past benign pathology patient with no past history of abnormal Pap smears.Colonoscopy 2013 normal (father with history of colon cancer). Patient has been followed also by her primary care physician Dr. Marton Redwood who has been doing her labs as well as her holistic medicine doctor Dr.Agustedes. They both had been drawing her labs and been treating her for her lupus as well as for her hypothyroidism. Her holistic medicine physician has had her on estrogen cream 0.125 daily along with Prometrium 200 mg daily along with DHEA and pregnenolone. We have discussed in the past the ACOG guidelines as well as detailed discussion of the women's health initiative study indicating that women who need to be on hormone replacement therapy should be on a cyclical regimen and not  continuous regimen of estrogen and progesterone to minimize the risk of breast cancer. Patient states that her holistic medicine provider decrease her Prometrium 200 mg daily. She reports no vaginal bleeding.  Patient with no past history of any abnormal Pap smears. Patient declined flu vaccine.  Past medical history,surgical history, family history and social history were all reviewed and documented in the EPIC chart.  Gynecologic History No LMP recorded. Patient is postmenopausal. Contraception: post menopausal status Last Pap: 2013. Results were: normal Last mammogram: 2018. Results were: normal  Obstetric History OB History  Gravida Para Term Preterm AB Living  4 3 3   1 3   SAB TAB Ectopic Multiple Live Births  1       3    # Outcome Date GA Lbr Len/2nd Weight Sex Delivery Anes PTL Lv  4 SAB           3 Term     F Vag-Spont  N LIV  2 Term     M Vag-Spont  N LIV  1 Term     F Vag-Spont  N LIV       ROS: A ROS was performed and pertinent positives and negatives are included in the history.  GENERAL: No fevers or chills. HEENT: No change in vision, no earache, sore throat or sinus congestion. NECK: No pain or stiffness. CARDIOVASCULAR: No chest pain or pressure. No palpitations. PULMONARY: No shortness of breath, cough or wheeze. GASTROINTESTINAL: No abdominal pain, nausea, vomiting or diarrhea, melena or bright red blood per rectum. GENITOURINARY: No urinary frequency, urgency, hesitancy or dysuria. MUSCULOSKELETAL: No joint or muscle pain, no back pain, no  recent trauma. DERMATOLOGIC: No rash, no itching, no lesions. ENDOCRINE: No polyuria, polydipsia, no heat or cold intolerance. No recent change in weight. HEMATOLOGICAL: No anemia or easy bruising or bleeding. NEUROLOGIC: No headache, seizures, numbness, tingling or weakness. PSYCHIATRIC: No depression, no loss of interest in normal activity or change in sleep pattern.     Exam: chaperone present  BP 110/78   Ht 5\' 7"  (1.702  m)   Wt 159 lb (72.1 kg)   BMI 24.90 kg/m   Body mass index is 24.9 kg/m.  General appearance : Well developed well nourished female. No acute distress HEENT: Eyes: no retinal hemorrhage or exudates,  Neck supple, trachea midline, no carotid bruits, no thyroidmegaly Lungs: Clear to auscultation, no rhonchi or wheezes, or rib retractions  Heart: Regular rate and rhythm, no murmurs or gallops Breast:Examined in sitting and supine position were symmetrical in appearance, no palpable masses or tenderness,  no skin retraction, no nipple inversion, no nipple discharge, no skin discoloration, no axillary or supraclavicular lymphadenopathy Abdomen: no palpable masses or tenderness, no rebound or guarding Extremities: no edema or skin discoloration or tenderness  Pelvic:  Bartholin, Urethra, Skene Glands: Within normal limits             Vagina: No gross lesions or discharge, atrophic changes  Cervix: No gross lesions or discharge  Uterus  anteverted, normal size, shape and consistency, non-tender and mobile  Adnexa  Without masses or tenderness  Anus and perineum  normal   Rectovaginal  normal sphincter tone without palpated masses or tenderness             Hemoccult PCP provides     Assessment/Plan:  71 y.o. female for annual exam with history of osteoporosis received her first IV infusion of Reclast IV in December 2017 and has tolerated well. Her medical oncologist has been monitoring. She will need a bone density study in 2019. We encouraged her calcium and vitamin D and weightbearing exercises for osteoporosis prevention. Pap smear was done today. Patient declined flu vaccine today.   Terrance Mass MD, 11:05 AM 06/27/2016

## 2016-06-30 ENCOUNTER — Other Ambulatory Visit: Payer: Self-pay | Admitting: Cardiovascular Disease

## 2016-07-01 LAB — PAP, TP IMAGING W/ HPV RNA, RFLX HPV TYPE 16,18/45: HPV mRNA, High Risk: NOT DETECTED

## 2016-07-02 ENCOUNTER — Other Ambulatory Visit (HOSPITAL_BASED_OUTPATIENT_CLINIC_OR_DEPARTMENT_OTHER): Payer: BLUE CROSS/BLUE SHIELD

## 2016-07-02 ENCOUNTER — Ambulatory Visit (HOSPITAL_BASED_OUTPATIENT_CLINIC_OR_DEPARTMENT_OTHER): Payer: BLUE CROSS/BLUE SHIELD | Admitting: Hematology & Oncology

## 2016-07-02 DIAGNOSIS — M79622 Pain in left upper arm: Secondary | ICD-10-CM

## 2016-07-02 LAB — CBC WITH DIFFERENTIAL (CANCER CENTER ONLY)
BASO#: 0 10*3/uL (ref 0.0–0.2)
BASO%: 0.3 % (ref 0.0–2.0)
EOS%: 1.1 % (ref 0.0–7.0)
Eosinophils Absolute: 0.1 10*3/uL (ref 0.0–0.5)
HCT: 43.5 % (ref 34.8–46.6)
HGB: 15.2 g/dL (ref 11.6–15.9)
LYMPH#: 2.8 10*3/uL (ref 0.9–3.3)
LYMPH%: 39.5 % (ref 14.0–48.0)
MCH: 32.2 pg (ref 26.0–34.0)
MCHC: 34.9 g/dL (ref 32.0–36.0)
MCV: 92 fL (ref 81–101)
MONO#: 0.7 10*3/uL (ref 0.1–0.9)
MONO%: 10.5 % (ref 0.0–13.0)
NEUT#: 3.4 10*3/uL (ref 1.5–6.5)
NEUT%: 48.6 % (ref 39.6–80.0)
Platelets: 238 10*3/uL (ref 145–400)
RBC: 4.72 10*6/uL (ref 3.70–5.32)
RDW: 13.3 % (ref 11.1–15.7)
WBC: 7 10*3/uL (ref 3.9–10.0)

## 2016-07-02 LAB — COMPREHENSIVE METABOLIC PANEL
ALT: 34 U/L (ref 0–55)
AST: 27 U/L (ref 5–34)
Albumin: 4.1 g/dL (ref 3.5–5.0)
Alkaline Phosphatase: 87 U/L (ref 40–150)
Anion Gap: 8 mEq/L (ref 3–11)
BUN: 20.3 mg/dL (ref 7.0–26.0)
CO2: 26 mEq/L (ref 22–29)
Calcium: 10 mg/dL (ref 8.4–10.4)
Chloride: 105 mEq/L (ref 98–109)
Creatinine: 0.8 mg/dL (ref 0.6–1.1)
EGFR: 79 mL/min/{1.73_m2} — ABNORMAL LOW (ref >90)
Glucose: 81 mg/dl (ref 70–140)
Potassium: 4.1 mEq/L (ref 3.5–5.1)
Sodium: 139 mEq/L (ref 136–145)
Total Bilirubin: 0.55 mg/dL (ref 0.20–1.20)
Total Protein: 7.5 g/dL (ref 6.4–8.3)

## 2016-07-02 NOTE — Progress Notes (Signed)
Hematology and Oncology Follow Up Visit  Lori [name] [id number] Oct 04, 1945 71 y.o. 07/02/2016   Principle Diagnosis:  Hemochromatosis - heterozygous for the C282Y mutation   Current Therapy:   Phlebotomy as indicated to keep ferritin < 50    Interim History:  Lori [name] is here today for a follow-up. She is doing okay. She is quite talkative. She is talking all about her autoimmune issues. She's having a lot of problems with onychomycosis. She's having a lot of finger nail issues. She says that this is autoimmune. She says she puts her hands in chloride to try to help with the infections. She does see a dermatologist.  Her last iron studies back in December showed a ferritin of 47 with iron saturation of 52%. I think she was probably phlebotomized back in October when her iron saturation was 86%.   She had no problems over the holidays.  Overall, her performance status is ECOG 0.    Medications:  Allergies as of 07/02/2016      Reactions   Albumin (human)    Amoxicillin Nausea And Vomiting   Codeine    Erythromycin    Keflex [cephalexin] Itching   Meperidine    Metoclopramide Hcl    Other Other (See Comments)   Beef Chicken Dairy Eggs   Shellfish-derived Products    Oxycodone Nausea And Vomiting      Medication List       Accurate as of 07/02/16 12:25 PM. Always use your most recent med list.          Biotin 5 MG Caps Take 5 mg by mouth.   Boron Amorphous Fine Powd Take by mouth.   CO Q 10 PO Take 1 tablet by mouth daily.   dextroamphetamine 5 MG 24 hr capsule Commonly known as:  DEXEDRINE SPANSULE Take 5 mg by mouth daily.   doxycycline 100 MG tablet Commonly known as:  VIBRA-TABS Take 100 mg by mouth.   fish oil-omega-3 fatty acids 1000 MG capsule Take 1 g by mouth daily.   fluconazole 100 MG tablet Commonly known as:  DIFLUCAN Take 100 mg by mouth.   GLUTAMINE PO Take 1 tablet by mouth 2 (two) times daily.   LYSINE PO Take 1 tablet by mouth  2 (two) times daily.   MAGNESIUM PO Take 1 tablet by mouth daily.   MELATONIN PO Take 2 mg by mouth at bedtime.   metoprolol succinate 25 MG 24 hr tablet Commonly known as:  TOPROL-XL TAKE 1 TABLET IN AM AND 1/2 TABLET IN THE PM.   MSM Powd Take by mouth.   multivitamin tablet Take 1 tablet by mouth daily.   NATURE-THROID 97.5 MG Tabs Generic drug:  Thyroid Take 1 tablet by mouth daily.   NON FORMULARY Bio-est Cream   NON FORMULARY Pregnolol cap   NONFORMULARY OR COMPOUNDED ITEM Boric Acid 600 mg caps. S: insert vaginally twice daily x 2 weeks then twice weekly to complete 3 mos of treatment.   potassium chloride SA 20 MEQ tablet Commonly known as:  K-DUR,KLOR-CON Take 1 tablet by mouth daily.   Prasterone (DHEA) 10 MG Caps Take 7.5 mg by mouth daily.   progesterone 200 MG capsule Commonly known as:  PROMETRIUM Take 200 mg by mouth daily.   vitamin C 1000 MG tablet Take 1,000 mg by mouth 3 (three) times daily.   VITAMIN D PO Take 1 tablet by mouth daily.   VITAMIN K2 PO Take by mouth once.  Allergies:  Allergies  Allergen Reactions  . Albumin (Human)   . Amoxicillin Nausea And Vomiting  . Codeine   . Erythromycin   . Keflex [Cephalexin] Itching  . Meperidine   . Metoclopramide Hcl   . Other Other (See Comments)    Beef Chicken Dairy Eggs  . Shellfish-Derived Products   . Oxycodone Nausea And Vomiting    Past Medical History, Surgical history, Social history, and Family History were reviewed and updated.  Review of Systems: All other 10 point review of systems is negative.   Physical Exam:  weight is 162 lb (73.5 kg). Her oral temperature is 98.9 F (37.2 C). Her blood pressure is 122/51 (abnormal) and her pulse is 94. Her respiration is 20.   Wt Readings from Last 3 Encounters:  07/02/16 162 lb (73.5 kg)  06/27/16 159 lb (72.1 kg)  06/26/16 165 lb (74.8 kg)    Ocular: Sclerae unicteric, pupils equal, round and reactive to  light Ear-nose-throat: Oropharynx clear, dentition fair Lymphatic: No cervical supraclavicular or axillary adenopathy Lungs no rales or rhonchi, good excursion bilaterally Heart regular rate and rhythm, no murmur appreciated Abd soft, nontender, positive bowel sounds, no liver or spleen tip palpated on exam, no fluid wave MSK no focal spinal tenderness, no joint edema Neuro: non-focal, well-oriented, appropriate affect Breasts: Deferred  Lab Results  Component Value Date   WBC 7.0 07/02/2016   HGB 15.2 07/02/2016   HCT 43.5 07/02/2016   MCV 92 07/02/2016   PLT 238 07/02/2016   Lab Results  Component Value Date   FERRITIN 47 04/30/2016   IRON 147 (H) 04/30/2016   TIBC 283 04/30/2016   UIBC 135 04/30/2016   IRONPCTSAT 52 04/30/2016   Lab Results  Component Value Date   RBC 4.72 07/02/2016   No results found for: KPAFRELGTCHN, LAMBDASER, KAPLAMBRATIO No results found for: IGGSERUM, IGA, IGMSERUM No results found for: Odetta Pink, SPEI   Chemistry      Component Value Date/Time   NA 138 04/30/2016 1248   K 4.5 04/30/2016 1248   CL 105 02/27/2016 1112   CO2 25 04/30/2016 1248   BUN 14.5 04/30/2016 1248   CREATININE 0.7 04/30/2016 1248      Component Value Date/Time   CALCIUM 10.3 04/30/2016 1248   ALKPHOS 100 04/30/2016 1248   AST 35 (H) 04/30/2016 1248   ALT 34 04/30/2016 1248   BILITOT 0.58 04/30/2016 1248     Impression and Plan: Lori [name] is a pleasant 71 yo white female with a history of hemochromocytosis - heterozygous for the C282T.   We will have to see what her iron levels show. Over, she will not need to be phlebotomized.  Everything looks okay, then we will go ahead and plan to get her back in another 3-4 months.     Volanda Napoleon, MD 2/7/201812:25 PM

## 2016-07-03 LAB — IRON AND TIBC
%SAT: 91 % — AB (ref 21–57)
IRON: 242 ug/dL — AB (ref 41–142)
TIBC: 266 ug/dL (ref 236–444)
UIBC: 25 ug/dL — ABNORMAL LOW (ref 120–384)

## 2016-07-03 LAB — FERRITIN: Ferritin: 37 ng/ml (ref 9–269)

## 2016-07-04 ENCOUNTER — Telehealth: Payer: Self-pay | Admitting: *Deleted

## 2016-07-04 NOTE — Telephone Encounter (Addendum)
Left message requesting call back  ----- Message from Volanda Napoleon, MD sent at 07/03/2016  4:58 PM EST ----- Call - iron level is ok!!  No need for phlebotomy!!  pete

## 2016-07-17 ENCOUNTER — Ambulatory Visit: Payer: BLUE CROSS/BLUE SHIELD | Admitting: Podiatry

## 2016-07-24 ENCOUNTER — Ambulatory Visit (INDEPENDENT_AMBULATORY_CARE_PROVIDER_SITE_OTHER): Payer: BLUE CROSS/BLUE SHIELD | Admitting: Podiatry

## 2016-07-24 ENCOUNTER — Encounter: Payer: Self-pay | Admitting: Podiatry

## 2016-07-24 DIAGNOSIS — D3610 Benign neoplasm of peripheral nerves and autonomic nervous system, unspecified: Secondary | ICD-10-CM | POA: Diagnosis not present

## 2016-07-24 DIAGNOSIS — M2042 Other hammer toe(s) (acquired), left foot: Secondary | ICD-10-CM

## 2016-07-24 NOTE — Patient Instructions (Signed)

## 2016-07-25 NOTE — Progress Notes (Signed)
Subjective:     Patient ID: Lori [name], female   DOB: August 14, 1945, 71 y.o.   MRN: [id number]  HPI patient presents stating that I am not getting better and having a lot of pain between these 2 toes and also nerve pain with shooting between my third and fourth toes left foot. Patient states the training on the help for a period of time and the injection only helped for about 12 hours   Review of Systems     Objective:   Physical Exam Neurovascular status intact muscle strength adequate with patient having good digital perfusion. Patient's noted to have exquisite discomfort third interspace left with a palpable mass and is noted to have keratotic lesion on the inside of the fourth digit left and on the medial side of the fifth digit where the 2 toes are pressing with significant rotation of the distal phalanx of the left fifth toe    Assessment:     Adductovarus deformity fifth digit left along with hammertoe deformity fourth left and probable neuroma symptomatology left    Plan:     H&P conditions reviewed with patient. I discussed at great length the condition and patient wants aggressive treatment and I've recommended distal arthroplasty digit 5 left proximal arthroplasty digit 4 left and neurectomy third interspace left. I explained procedures and risk and reviewed at great length what we'll be required and patient wants surgery understanding risk and the fact will not be able to put these toes into complete normal position due to hereditary condition. She understands this completely and the fact that are goes to relieve discomfort and at this time I recommended distal are thick post digit 5 left proximal at the digit 4 left and neurectomy at this time. Patient is allowed to read consent form reviewing alternative treatments complications that are listed and wants surgery understanding risk and signs consent form. Patient scheduled for outpatient surgery at this time is encouraged to call with  any questions prior to procedure

## 2016-08-05 ENCOUNTER — Other Ambulatory Visit: Payer: Self-pay | Admitting: Cardiovascular Disease

## 2016-08-05 NOTE — Telephone Encounter (Signed)
Rx(s) sent to pharmacy electronically.  

## 2016-10-08 ENCOUNTER — Other Ambulatory Visit: Payer: Self-pay | Admitting: *Deleted

## 2016-10-08 ENCOUNTER — Encounter: Payer: Self-pay | Admitting: Gynecology

## 2016-10-09 ENCOUNTER — Other Ambulatory Visit: Payer: BLUE CROSS/BLUE SHIELD

## 2016-10-09 ENCOUNTER — Ambulatory Visit: Payer: BLUE CROSS/BLUE SHIELD | Admitting: Family

## 2016-10-09 ENCOUNTER — Other Ambulatory Visit (HOSPITAL_BASED_OUTPATIENT_CLINIC_OR_DEPARTMENT_OTHER): Payer: BLUE CROSS/BLUE SHIELD

## 2016-10-09 LAB — CBC WITH DIFFERENTIAL (CANCER CENTER ONLY)
BASO#: 0 10*3/uL (ref 0.0–0.2)
BASO%: 0.7 % (ref 0.0–2.0)
EOS ABS: 0.1 10*3/uL (ref 0.0–0.5)
EOS%: 1 % (ref 0.0–7.0)
HCT: 45.3 % (ref 34.8–46.6)
HGB: 15.7 g/dL (ref 11.6–15.9)
LYMPH#: 2.7 10*3/uL (ref 0.9–3.3)
LYMPH%: 45.7 % (ref 14.0–48.0)
MCH: 33.1 pg (ref 26.0–34.0)
MCHC: 34.7 g/dL (ref 32.0–36.0)
MCV: 95 fL (ref 81–101)
MONO#: 0.6 10*3/uL (ref 0.1–0.9)
MONO%: 10.4 % (ref 0.0–13.0)
NEUT#: 2.5 10*3/uL (ref 1.5–6.5)
NEUT%: 42.2 % (ref 39.6–80.0)
Platelets: 219 10*3/uL (ref 145–400)
RBC: 4.75 10*6/uL (ref 3.70–5.32)
RDW: 12.7 % (ref 11.1–15.7)
WBC: 6 10*3/uL (ref 3.9–10.0)

## 2016-10-10 ENCOUNTER — Ambulatory Visit (HOSPITAL_BASED_OUTPATIENT_CLINIC_OR_DEPARTMENT_OTHER): Payer: BLUE CROSS/BLUE SHIELD

## 2016-10-10 ENCOUNTER — Ambulatory Visit (HOSPITAL_BASED_OUTPATIENT_CLINIC_OR_DEPARTMENT_OTHER): Payer: BLUE CROSS/BLUE SHIELD | Admitting: Family

## 2016-10-10 ENCOUNTER — Other Ambulatory Visit: Payer: BLUE CROSS/BLUE SHIELD

## 2016-10-10 LAB — IRON AND TIBC
%SAT: 69 % — AB (ref 21–57)
Iron: 169 ug/dL — ABNORMAL HIGH (ref 41–142)
TIBC: 245 ug/dL (ref 236–444)
UIBC: 76 ug/dL — AB (ref 120–384)

## 2016-10-10 LAB — FERRITIN: Ferritin: 61 ng/ml (ref 9–269)

## 2016-10-10 NOTE — Progress Notes (Signed)
Hematology and Oncology Follow Up Visit  Lori [name] [id number] [birth year] 71 y.o. 10/10/2016   Principle Diagnosis:  Hemochromatosis - heterozygous for the C282Y mutation   Current Therapy:   Phlebotomy as indicated to keep ferritin < 50    Interim History:  Lori [name] is here today for follow-up and phlebotomy. She has started to have worsening of joint aches and pains. Her vision has also been effected and gotten blurry. Her ferritin is 61 so we will phlebotomize her today.  Her last phlebotomy was in October. She has also had some numbness and tingling in her hands that comes and goes due to "spine issues." She has had no fever, chills, n/v, cough, rash, dizziness, chest pain, palpitations, abdominal pain or changes in bowel or bladder habits.  She has chronic constipation and takes a stool softener as needed.  No swelling or tenderness in her extremities.  She has maintained a good appetite and is staying well hydrated. Her weight is stable.   ECOG Performance Status: 1 - Symptomatic but completely ambulatory  Medications:  Allergies as of 10/10/2016      Reactions   Albumin (human)    Amoxicillin Nausea And Vomiting   Codeine    Erythromycin    Keflex [cephalexin] Itching   Meperidine    Metoclopramide Hcl    Other Other (See Comments)   Beef Chicken Dairy Eggs   Shellfish-derived Products    Oxycodone Nausea And Vomiting      Medication List       Accurate as of 10/10/16  1:45 PM. Always use your most recent med list.          Biotin 5 MG Caps Take 5 mg by mouth.   Boron Amorphous Fine Powd Take by mouth.   CO Q 10 PO Take 1 tablet by mouth daily.   dextroamphetamine 5 MG 24 hr capsule Commonly known as:  DEXEDRINE SPANSULE Take 5 mg by mouth daily.   doxycycline 100 MG tablet Commonly known as:  VIBRA-TABS   fish oil-omega-3 fatty acids 1000 MG capsule Take 1 g by mouth daily.   fluconazole 100 MG tablet Commonly known as:  DIFLUCAN Take  100 mg by mouth.   GLUTAMINE PO Take 1 tablet by mouth 2 (two) times daily.   LYSINE PO Take 1 tablet by mouth 2 (two) times daily.   MAGNESIUM PO Take 1 tablet by mouth daily.   MELATONIN PO Take 2 mg by mouth at bedtime.   metoprolol succinate 25 MG 24 hr tablet Commonly known as:  TOPROL-XL TAKE 1 TABLET IN AM AND 1/2 TABLET IN THE PM.   mirabegron ER 50 MG Tb24 tablet Commonly known as:  MYRBETRIQ Take 50 mg by mouth.   MSM Powd Take by mouth.   multivitamin tablet Take 1 tablet by mouth daily.   NATURE-THROID 97.5 MG Tabs Generic drug:  Thyroid Take 1 tablet by mouth daily.   NON FORMULARY Bio-est Cream   NON FORMULARY Pregnolol cap   NONFORMULARY OR COMPOUNDED ITEM Boric Acid 600 mg caps. S: insert vaginally twice daily x 2 weeks then twice weekly to complete 3 mos of treatment.   potassium chloride SA 20 MEQ tablet Commonly known as:  K-DUR,KLOR-CON Take 1 tablet by mouth daily.   Prasterone (DHEA) 10 MG Caps Take 7.5 mg by mouth daily.   progesterone 200 MG capsule Commonly known as:  PROMETRIUM Take 200 mg by mouth daily.   vitamin C 1000 MG tablet Take 1,000  mg by mouth 3 (three) times daily.   VITAMIN D PO Take 1 tablet by mouth daily.   VITAMIN K2 PO Take by mouth once.       Allergies:  Allergies  Allergen Reactions  . Albumin (Human)   . Amoxicillin Nausea And Vomiting  . Codeine   . Erythromycin   . Keflex [Cephalexin] Itching  . Meperidine   . Metoclopramide Hcl   . Other Other (See Comments)    Beef Chicken Dairy Eggs  . Shellfish-Derived Products   . Oxycodone Nausea And Vomiting    Past Medical History, Surgical history, Social history, and Family History were reviewed and updated.  Review of Systems: All other 10 point review of systems is negative.   Physical Exam:  weight is 162 lb 1.3 oz (73.5 kg). Her oral temperature is 98.1 F (36.7 C). Her blood pressure is 117/57 (abnormal) and her pulse is 72. Her  respiration is 16 and oxygen saturation is 100%.   Wt Readings from Last 3 Encounters:  10/10/16 162 lb 1.3 oz (73.5 kg)  07/02/16 162 lb (73.5 kg)  06/27/16 159 lb (72.1 kg)    Ocular: Sclerae unicteric, pupils equal, round and reactive to light Ear-nose-throat: Oropharynx clear, dentition fair Lymphatic: No cervical, supraclavicular or axillary adenopathy Lungs no rales or rhonchi, good excursion bilaterally Heart regular rate and rhythm, no murmur appreciated Abd soft, nontender, positive bowel sounds, no liver or spleen tip palpated on exam, no fluid wave MSK no focal spinal tenderness, no joint edema Neuro: non-focal, well-oriented, appropriate affect Breasts: Deferred   Lab Results  Component Value Date   WBC 6.0 10/09/2016   HGB 15.7 10/09/2016   HCT 45.3 10/09/2016   MCV 95 10/09/2016   PLT 219 10/09/2016   Lab Results  Component Value Date   FERRITIN 61 10/09/2016   IRON 169 (H) 10/09/2016   TIBC 245 10/09/2016   UIBC 76 (L) 10/09/2016   IRONPCTSAT 69 (H) 10/09/2016   Lab Results  Component Value Date   RBC 4.75 10/09/2016   No results found for: KPAFRELGTCHN, LAMBDASER, KAPLAMBRATIO No results found for: IGGSERUM, IGA, IGMSERUM No results found for: Odetta Pink, SPEI   Chemistry      Component Value Date/Time   NA 139 07/02/2016 1139   K 4.1 07/02/2016 1139   CL 105 02/27/2016 1112   CO2 26 07/02/2016 1139   BUN 20.3 07/02/2016 1139   CREATININE 0.8 07/02/2016 1139      Component Value Date/Time   CALCIUM 10.0 07/02/2016 1139   ALKPHOS 87 07/02/2016 1139   AST 27 07/02/2016 1139   ALT 34 07/02/2016 1139   BILITOT 0.55 07/02/2016 1139      Impression and Plan: Lori [name] is a pleasant 70 yo female with history of hemochromatosis, heterozygous for the C282Y. She is symptomatic at this time with blurry vision and increased joint aches and pains. Hct is 61.  We attempted her phlebotomy today but  were unable to complete. She will come back in next week on Tuesday for phlebotomy.  We will plan to see her back in another 3 months for follow-up and have her come in for labs a day or so before so her ferritin level will be available.  She will contact our office with any questions or concerns. We can certainly see her sooner if need be.   Eliezer Bottom, NP 5/18/20181:45 PM

## 2016-10-10 NOTE — Progress Notes (Signed)
One unit phlebotomy attempted left wrist area with 20 g and was only able to obtain 100 grams .

## 2016-10-10 NOTE — Patient Instructions (Signed)
     Therapeutic Phlebotomy, Care After Refer to this sheet in the next few weeks. These instructions provide you with information about caring for yourself after your procedure. Your health care provider may also give you more specific instructions. Your treatment has been planned according to current medical practices, but problems sometimes occur. Call your health care provider if you have any problems or questions after your procedure. What can I expect after the procedure? After the procedure, it is common to have:  Light-headedness or dizziness. You may feel faint.  Nausea.  Tiredness. Follow these instructions at home: Activity  Return to your normal activities as directed by your health care provider. Most people can go back to their normal activities right away.  Avoid strenuous physical activity and heavy lifting or pulling for about 5 hours after the procedure. Do not lift anything that is heavier than 10 lb (4.5 kg).  Athletes should avoid strenuous exercise for at least 12 hours.  Change positions slowly for the remainder of the day. This will help to prevent light-headedness or fainting.  If you feel light-headed, lie down until the feeling goes away. Eating and drinking  Be sure to eat well-balanced meals for the next 24 hours.  Drink enough fluid to keep your urine clear or pale yellow.  Avoid drinking alcohol on the day that you had the procedure. Care of the Needle Insertion Site  Keep your bandage dry. You can remove the bandage after about 5 hours or as directed by your health care provider.  If you have bleeding from the needle insertion site, elevate your arm and press firmly on the site until the bleeding stops.  If you have bruising at the site, apply ice to the area:  Put ice in a plastic bag.  Place a towel between your skin and the bag.  Leave the ice on for 20 minutes, 2-3 times a day for the first 24 hours.  If the swelling does not go away  after 24 hours, apply a warm, moist washcloth to the area for 20 minutes, 2-3 times a day. General instructions  Avoid smoking for at least 30 minutes after the procedure.  Keep all follow-up visits as directed by your health care provider. It is important to continue with further therapeutic phlebotomy treatments as directed. Contact a health care provider if:  You have redness, swelling, or pain at the needle insertion site.  You have fluid, blood, or pus coming from the needle insertion site.  You feel light-headed, dizzy, or nauseated, and the feeling does not go away.  You notice new bruising at the needle insertion site.  You feel weaker than normal.  You have a fever or chills. Get help right away if:  You have severe nausea or vomiting.  You have chest pain.  You have trouble breathing. This information is not intended to replace advice given to you by your health care provider. Make sure you discuss any questions you have with your health care provider. Document Released: 10/14/2010 Document Revised: 01/12/2016 Document Reviewed: 05/08/2014 Elsevier Interactive Patient Education  2017 [employer]  

## 2016-10-14 ENCOUNTER — Ambulatory Visit (HOSPITAL_BASED_OUTPATIENT_CLINIC_OR_DEPARTMENT_OTHER): Payer: BLUE CROSS/BLUE SHIELD

## 2016-10-14 NOTE — Patient Instructions (Signed)
Therapeutic Phlebotomy Therapeutic phlebotomy is the controlled removal of blood from a person's body for the purpose of treating a medical condition. The procedure is similar to donating blood. Usually, about a pint (470 mL, or 0.47L) of blood is removed. The average adult has 9-12 pints (4.3-5.7 L) of blood. Therapeutic phlebotomy may be used to treat the following medical conditions:  Hemochromatosis. This is a condition in which the blood contains too much iron.  Polycythemia vera. This is a condition in which the blood contains too many red blood cells.  Porphyria cutanea tarda. This is a disease in which an important part of hemoglobin is not made properly. It results in the buildup of abnormal amounts of porphyrins in the body.  Sickle cell disease. This is a condition in which the red blood cells form an abnormal crescent shape rather than a round shape. Tell a health care provider about:  Any allergies you have.  All medicines you are taking, including vitamins, herbs, eye drops, creams, and over-the-counter medicines.  Any problems you or family members have had with anesthetic medicines.  Any blood disorders you have.  Any surgeries you have had.  Any medical conditions you have. What are the risks? Generally, this is a safe procedure. However, problems may occur, including:  Nausea or light-headedness.  Low blood pressure.  Soreness, bleeding, swelling, or bruising at the needle insertion site.  Infection. What happens before the procedure?  Follow instructions from your health care provider about eating or drinking restrictions.  Ask your health care provider about changing or stopping your regular medicines. This is especially important if you are taking diabetes medicines or blood thinners.  Wear clothing with sleeves that can be raised above the elbow.  Plan to have someone take you home after the procedure.  You may have a blood sample taken. What happens  during the procedure?  A needle will be inserted into one of your veins.  Tubing and a collection bag will be attached to that needle.  Blood will flow through the needle and tubing into the collection bag.  You may be asked to open and close your hand slowly and continually during the entire collection.  After the specified amount of blood has been removed from your body, the collection bag and tubing will be clamped.  The needle will be removed from your vein.  Pressure will be held on the site of the needle insertion to stop the bleeding.  A bandage (dressing) will be placed over the needle insertion site. The procedure may vary among health care providers and hospitals. What happens after the procedure?  Your recovery will be assessed and monitored.  You can return to your normal activities as directed by your health care provider. This information is not intended to replace advice given to you by your health care provider. Make sure you discuss any questions you have with your health care provider. Document Released: 10/14/2010 Document Revised: 01/12/2016 Document Reviewed: 05/08/2014 Elsevier Interactive Patient Education  2017 [employer]  

## 2016-10-14 NOTE — Progress Notes (Signed)
Lori [name] presents today for phlebotomy per MD orders. Phlebotomy procedure started at 1420 and ended at 1435. 500 grams removed. Patient observed for 30 minutes after procedure without any incident. Patient tolerated procedure well. IV needle removed intact.

## 2017-01-06 ENCOUNTER — Other Ambulatory Visit: Payer: BLUE CROSS/BLUE SHIELD

## 2017-01-06 ENCOUNTER — Ambulatory Visit: Payer: BLUE CROSS/BLUE SHIELD

## 2017-01-06 ENCOUNTER — Ambulatory Visit (HOSPITAL_BASED_OUTPATIENT_CLINIC_OR_DEPARTMENT_OTHER): Payer: BLUE CROSS/BLUE SHIELD | Admitting: Family

## 2017-01-06 VITALS — BP 115/55 | HR 81 | Resp 16

## 2017-01-06 DIAGNOSIS — M810 Age-related osteoporosis without current pathological fracture: Secondary | ICD-10-CM

## 2017-01-06 NOTE — Progress Notes (Signed)
Attempted p-hlebotomy x's 2...Marland KitchenMarland KitchenThe patient left the office before the visit was finished. Antecubital area both times.  I was only abto obtain 54mls.  Patient will reschedule and try again another day.

## 2017-01-06 NOTE — Progress Notes (Signed)
Phlebotomy attempted x1 to right hand, which was unsuccessful.  Second attempt to right wrist with 30 grams removed.  Pressure dressings applied to both sites.

## 2017-01-06 NOTE — Patient Instructions (Signed)

## 2017-01-06 NOTE — Progress Notes (Signed)
Hematology and Oncology Follow Up Visit  LA SHEHAN [id number] 08/07/1945 71 y.o. 01/06/2017   Principle Diagnosis:  Hemochromatosis - heterozygous for the C282Y mutation   Current Therapy:   Phlebotomy as indicated to keep ferritin < 50    Interim History:  Ms. Babson is here today for follow-up. She is symptomatic with blurry vision and headaches. Her ferritin is 46 with an iron saturation of 59%.  She denies fever, chills, n/v, cough, rash, dizziness, SOB, chest pain, palpitations, abdominal pain or changes in bowel or bladder habits.  No swelling, tenderness, numbness or tingling in her extremities.  She has maintained a good appetite and is staying well hydrated. Her weight is stable.   ECOG Performance Status: 1 - Symptomatic but completely ambulatory  Medications:  Allergies as of 01/06/2017      Reactions   Albumin (human)    Amoxicillin Nausea And Vomiting   Codeine    Erythromycin    Keflex [cephalexin] Itching   Meperidine    Metoclopramide Hcl    Other Other (See Comments)   Beef Chicken Dairy Eggs   Shellfish-derived Products    Oxycodone Nausea And Vomiting      Medication List       Accurate as of 01/06/17  1:12 PM. Always use your most recent med list.          Biotin 5 MG Caps Take 5 mg by mouth.   Boron Amorphous Fine Powd Take by mouth.   CO Q 10 PO Take 1 tablet by mouth daily.   dextroamphetamine 5 MG 24 hr capsule Commonly known as:  DEXEDRINE SPANSULE Take 5 mg by mouth daily.   doxycycline 100 MG tablet Commonly known as:  VIBRA-TABS   fish oil-omega-3 fatty acids 1000 MG capsule Take 1 g by mouth daily.   fluconazole 100 MG tablet Commonly known as:  DIFLUCAN Take 100 mg by mouth.   GLUTAMINE PO Take 1 tablet by mouth 2 (two) times daily.   LYSINE PO Take 1 tablet by mouth 2 (two) times daily.   MAGNESIUM PO Take 1 tablet by mouth daily.   MELATONIN PO Take 2 mg by mouth at bedtime.   metoprolol succinate 25  MG 24 hr tablet Commonly known as:  TOPROL-XL TAKE 1 TABLET IN AM AND 1/2 TABLET IN THE PM.   MSM Powd Take by mouth.   multivitamin tablet Take 1 tablet by mouth daily.   NATURE-THROID 97.5 MG Tabs Generic drug:  Thyroid Take 1 tablet by mouth daily.   NON FORMULARY Bio-est Cream   NON FORMULARY Pregnolol cap   NONFORMULARY OR COMPOUNDED ITEM Boric Acid 600 mg caps. S: insert vaginally twice daily x 2 weeks then twice weekly to complete 3 mos of treatment.   potassium chloride SA 20 MEQ tablet Commonly known as:  K-DUR,KLOR-CON Take 1 tablet by mouth daily.   Prasterone (DHEA) 10 MG Caps Take 7.5 mg by mouth daily.   progesterone 200 MG capsule Commonly known as:  PROMETRIUM Take 200 mg by mouth daily.   vitamin C 1000 MG tablet Take 1,000 mg by mouth 3 (three) times daily.   VITAMIN D PO Take 1 tablet by mouth daily.   VITAMIN K2 PO Take by mouth once.       Allergies:  Allergies  Allergen Reactions  . Albumin (Human)   . Amoxicillin Nausea And Vomiting  . Codeine   . Erythromycin   . Keflex [Cephalexin] Itching  . Meperidine   .  Metoclopramide Hcl   . Other Other (See Comments)    Beef Chicken Dairy Eggs  . Shellfish-Derived Products   . Oxycodone Nausea And Vomiting    Past Medical History, Surgical history, Social history, and Family History were reviewed and updated.  Review of Systems: All other 10 point review of systems is negative.   Physical Exam:  oral temperature is 98.3 F (36.8 C). Her blood pressure is 104/60 and her pulse is 84. Her respiration is 16 and oxygen saturation is 97%.   Wt Readings from Last 3 Encounters:  10/10/16 162 lb 1.3 oz (73.5 kg)  07/02/16 162 lb (73.5 kg)  06/27/16 159 lb (72.1 kg)    Ocular: Sclerae unicteric, pupils equal, round and reactive to light Ear-nose-throat: Oropharynx clear, dentition fair Lymphatic: No cervical, supraclavicular or axillary adenopathy Lungs no rales or rhonchi, good  excursion bilaterally Heart regular rate and rhythm, no murmur appreciated Abd soft, nontender, positive bowel sounds, no liver or spleen tip palpated on exam, no fluid wave MSK no focal spinal tenderness, no joint edema Neuro: non-focal, well-oriented, appropriate affect Breasts: Deferred   Lab Results  Component Value Date   WBC 6.0 10/09/2016   HGB 15.7 10/09/2016   HCT 45.3 10/09/2016   MCV 95 10/09/2016   PLT 219 10/09/2016   Lab Results  Component Value Date   FERRITIN 61 10/09/2016   IRON 169 (H) 10/09/2016   TIBC 245 10/09/2016   UIBC 76 (L) 10/09/2016   IRONPCTSAT 69 (H) 10/09/2016   Lab Results  Component Value Date   RBC 4.75 10/09/2016   No results found for: KPAFRELGTCHN, LAMBDASER, KAPLAMBRATIO No results found for: IGGSERUM, IGA, IGMSERUM No results found for: Odetta Pink, SPEI   Chemistry      Component Value Date/Time   NA 139 07/02/2016 1139   K 4.1 07/02/2016 1139   CL 105 02/27/2016 1112   CO2 26 07/02/2016 1139   BUN 20.3 07/02/2016 1139   CREATININE 0.8 07/02/2016 1139      Component Value Date/Time   CALCIUM 10.0 07/02/2016 1139   ALKPHOS 87 07/02/2016 1139   AST 27 07/02/2016 1139   ALT 34 07/02/2016 1139   BILITOT 0.55 07/02/2016 1139      Impression and Plan: Ms. Heckert is a very pleasant 71 yo female with hemochromatosis, heterozygous for the C282Y mutation. She is currently symptomatic with blurry vision and headache. Ferritin is 46 with iron saturation of 59% We will proceed with phlebotomy today as planned.  We will see her back again in another 3 months for repeat lab work and follow-up.  She will contact our office with any questions or concerns. We can certainly see her sooner if need be.   Eliezer Bottom, NP 8/14/20181:12 PM

## 2017-01-08 ENCOUNTER — Ambulatory Visit (HOSPITAL_BASED_OUTPATIENT_CLINIC_OR_DEPARTMENT_OTHER): Payer: BLUE CROSS/BLUE SHIELD

## 2017-01-08 NOTE — Progress Notes (Signed)
Lori [name] presents today for phlebotomy per MD orders. Phlebotomy procedure started at 1422 and ended at 1445 with 507 grams removed. Patient observed for 30 minutes after procedure without any incident. Patient tolerated procedure well. IV needle removed intact.  WE are operating off the order from the MD from 8/14 for therapeutic phlebotomy that was not completed that day and patient returned for it to be done today.

## 2017-01-08 NOTE — Patient Instructions (Signed)
Therapeutic Phlebotomy Therapeutic phlebotomy is the controlled removal of blood from a person's body for the purpose of treating a medical condition. The procedure is similar to donating blood. Usually, about a pint (470 mL, or 0.47L) of blood is removed. The average adult has 9-12 pints (4.3-5.7 L) of blood. Therapeutic phlebotomy may be used to treat the following medical conditions:  Hemochromatosis. This is a condition in which the blood contains too much iron.  Polycythemia vera. This is a condition in which the blood contains too many red blood cells.  Porphyria cutanea tarda. This is a disease in which an important part of hemoglobin is not made properly. It results in the buildup of abnormal amounts of porphyrins in the body.  Sickle cell disease. This is a condition in which the red blood cells form an abnormal crescent shape rather than a round shape. Tell a health care provider about:  Any allergies you have.  All medicines you are taking, including vitamins, herbs, eye drops, creams, and over-the-counter medicines.  Any problems you or family members have had with anesthetic medicines.  Any blood disorders you have.  Any surgeries you have had.  Any medical conditions you have. What are the risks? Generally, this is a safe procedure. However, problems may occur, including:  Nausea or light-headedness.  Low blood pressure.  Soreness, bleeding, swelling, or bruising at the needle insertion site.  Infection. What happens before the procedure?  Follow instructions from your health care provider about eating or drinking restrictions.  Ask your health care provider about changing or stopping your regular medicines. This is especially important if you are taking diabetes medicines or blood thinners.  Wear clothing with sleeves that can be raised above the elbow.  Plan to have someone take you home after the procedure.  You may have a blood sample taken. What happens  during the procedure?  A needle will be inserted into one of your veins.  Tubing and a collection bag will be attached to that needle.  Blood will flow through the needle and tubing into the collection bag.  You may be asked to open and close your hand slowly and continually during the entire collection.  After the specified amount of blood has been removed from your body, the collection bag and tubing will be clamped.  The needle will be removed from your vein.  Pressure will be held on the site of the needle insertion to stop the bleeding.  A bandage (dressing) will be placed over the needle insertion site. The procedure may vary among health care providers and hospitals. What happens after the procedure?  Your recovery will be assessed and monitored.  You can return to your normal activities as directed by your health care provider. This information is not intended to replace advice given to you by your health care provider. Make sure you discuss any questions you have with your health care provider. Document Released: 10/14/2010 Document Revised: 01/12/2016 Document Reviewed: 05/08/2014 Elsevier Interactive Patient Education  2017 [employer]  

## 2017-01-09 ENCOUNTER — Encounter: Payer: Self-pay | Admitting: Hematology & Oncology

## 2017-03-11 ENCOUNTER — Other Ambulatory Visit (HOSPITAL_BASED_OUTPATIENT_CLINIC_OR_DEPARTMENT_OTHER): Payer: PPO

## 2017-03-11 ENCOUNTER — Ambulatory Visit (HOSPITAL_BASED_OUTPATIENT_CLINIC_OR_DEPARTMENT_OTHER): Payer: PPO | Admitting: Family

## 2017-03-11 ENCOUNTER — Ambulatory Visit (HOSPITAL_BASED_OUTPATIENT_CLINIC_OR_DEPARTMENT_OTHER): Payer: PPO

## 2017-03-11 VITALS — BP 114/60 | HR 90 | Temp 98.4°F | Resp 18

## 2017-03-11 DIAGNOSIS — R42 Dizziness and giddiness: Secondary | ICD-10-CM

## 2017-03-11 DIAGNOSIS — M810 Age-related osteoporosis without current pathological fracture: Secondary | ICD-10-CM

## 2017-03-11 DIAGNOSIS — R7989 Other specified abnormal findings of blood chemistry: Secondary | ICD-10-CM

## 2017-03-11 DIAGNOSIS — Z452 Encounter for adjustment and management of vascular access device: Secondary | ICD-10-CM

## 2017-03-11 LAB — CBC WITH DIFFERENTIAL (CANCER CENTER ONLY)
BASO#: 0 10*3/uL (ref 0.0–0.2)
BASO%: 0.4 % (ref 0.0–2.0)
EOS%: 1 % (ref 0.0–7.0)
Eosinophils Absolute: 0.1 10*3/uL (ref 0.0–0.5)
HCT: 42.3 % (ref 34.8–46.6)
HEMOGLOBIN: 14.4 g/dL (ref 11.6–15.9)
LYMPH#: 2.3 10*3/uL (ref 0.9–3.3)
LYMPH%: 33.9 % (ref 14.0–48.0)
MCH: 32.8 pg (ref 26.0–34.0)
MCHC: 34 g/dL (ref 32.0–36.0)
MCV: 96 fL (ref 81–101)
MONO#: 0.7 10*3/uL (ref 0.1–0.9)
MONO%: 9.9 % (ref 0.0–13.0)
NEUT%: 54.8 % (ref 39.6–80.0)
NEUTROS ABS: 3.8 10*3/uL (ref 1.5–6.5)
Platelets: 201 10*3/uL (ref 145–400)
RBC: 4.39 10*6/uL (ref 3.70–5.32)
RDW: 13 % (ref 11.1–15.7)
WBC: 6.9 10*3/uL (ref 3.9–10.0)

## 2017-03-11 LAB — CMP (CANCER CENTER ONLY)
ALBUMIN: 3.4 g/dL (ref 3.3–5.5)
ALK PHOS: 69 U/L (ref 26–84)
ALT: 32 U/L (ref 10–47)
AST: 31 U/L (ref 11–38)
BILIRUBIN TOTAL: 0.6 mg/dL (ref 0.20–1.60)
BUN, Bld: 15 mg/dL (ref 7–22)
CO2: 31 meq/L (ref 18–33)
CREATININE: 0.7 mg/dL (ref 0.6–1.2)
Calcium: 9.4 mg/dL (ref 8.0–10.3)
Chloride: 105 mEq/L (ref 98–108)
Glucose, Bld: 159 mg/dL — ABNORMAL HIGH (ref 73–118)
Potassium: 3.9 mEq/L (ref 3.3–4.7)
SODIUM: 143 meq/L (ref 128–145)
TOTAL PROTEIN: 6.6 g/dL (ref 6.4–8.1)

## 2017-03-11 MED ORDER — SODIUM CHLORIDE 0.9 % IV SOLN: Freq: Once | INTRAVENOUS | Status: DC

## 2017-03-11 NOTE — Progress Notes (Signed)
Lori [name] presents today for phlebotomy per MD orders. Phlebotomy procedure started at 1545 and ended at 1555 480 grams removed. Patient observed for 30 minutes after procedure without any incident. Patient tolerated procedure well. IV needle removed intact.

## 2017-03-11 NOTE — Progress Notes (Signed)
Hematology and Oncology Follow Up Visit  Lori [name] [id number] 03/24/1946 71 y.o. 03/11/2017   Principle Diagnosis:  Hemochromatosis - heterozygous for the C282Y mutation   Current Therapy:   Phlebotomy as indicated to keep ferritin <50    Interim History:  Lori [name] is here today for follow-up. She is symptomatic with dizziness and mild blurring of vision.  Her last phlebotomy was in August. She feels that she needs to be phlebotomized today.  No fever, chills, vomiting cough, rash, dizziness, SOB, chest pain, palpitations, abdominal pain or changes in bowel or bladder habits.  She has constipation at times and takes a stool softener as needed.  She has occasional puffiness in her feet and ankles that comes and goes.  She drinks collagen which helps with her joint aches and pains.  No numbness or tingling in her extremities. No c/o pain.  She has nausea when eating so her appetite is down. She is hydrating well. Her weight is unchanged.  She has trouble with malabsorption and takes specialty vitamins.   ECOG Performance Status: 1 - Symptomatic but completely ambulatory  Medications:  Allergies as of 03/11/2017      Reactions   Albumin (human)    Amoxicillin Nausea And Vomiting   Codeine    Erythromycin    Keflex [cephalexin] Itching   Meperidine    Metoclopramide Hcl    Other Other (See Comments)   Beef Chicken Dairy Eggs   Shellfish-derived Products    Oxycodone Nausea And Vomiting      Medication List       Accurate as of 03/11/17  2:16 PM. Always use your most recent med list.          Biotin 5 MG Caps Take 5 mg by mouth.   Boron Amorphous Fine Powd Take by mouth.   CO Q 10 PO Take 1 tablet by mouth daily.   dextroamphetamine 5 MG 24 hr capsule Commonly known as:  DEXEDRINE SPANSULE Take 5 mg by mouth daily.   doxycycline 100 MG tablet Commonly known as:  VIBRA-TABS   fish oil-omega-3 fatty acids 1000 MG capsule Take 1 g by mouth  daily.   fluconazole 100 MG tablet Commonly known as:  DIFLUCAN Take 100 mg by mouth.   GLUTAMINE PO Take 1 tablet by mouth 2 (two) times daily.   LYSINE PO Take 1 tablet by mouth 2 (two) times daily.   MAGNESIUM PO Take 1 tablet by mouth daily.   MELATONIN PO Take 2 mg by mouth at bedtime.   metoprolol succinate 25 MG 24 hr tablet Commonly known as:  TOPROL-XL TAKE 1 TABLET IN AM AND 1/2 TABLET IN THE PM.   MSM Powd Take by mouth.   multivitamin tablet Take 1 tablet by mouth daily.   NATURE-THROID 97.5 MG Tabs Generic drug:  Thyroid Take 1 tablet by mouth daily.   NON FORMULARY Bio-est Cream   NON FORMULARY Pregnolol cap   NONFORMULARY OR COMPOUNDED ITEM Boric Acid 600 mg caps. S: insert vaginally twice daily x 2 weeks then twice weekly to complete 3 mos of treatment.   potassium chloride SA 20 MEQ tablet Commonly known as:  K-DUR,KLOR-CON Take 1 tablet by mouth daily.   Prasterone (DHEA) 10 MG Caps Take 7.5 mg by mouth daily.   progesterone 200 MG capsule Commonly known as:  PROMETRIUM Take 200 mg by mouth daily.   vitamin C 1000 MG tablet Take 1,000 mg by mouth 3 (three) times daily.  VITAMIN D PO Take 1 tablet by mouth daily.   VITAMIN K2 PO Take by mouth once.       Allergies:  Allergies  Allergen Reactions  . Albumin (Human)   . Amoxicillin Nausea And Vomiting  . Codeine   . Erythromycin   . Keflex [Cephalexin] Itching  . Meperidine   . Metoclopramide Hcl   . Other Other (See Comments)    Beef Chicken Dairy Eggs  . Shellfish-Derived Products   . Oxycodone Nausea And Vomiting    Past Medical History, Surgical history, Social history, and Family History were reviewed and updated.  Review of Systems: All other 10 point review of systems is negative.   Physical Exam:  vitals were not taken for this visit.  Wt Readings from Last 3 Encounters:  10/10/16 162 lb 1.3 oz (73.5 kg)  07/02/16 162 lb (73.5 kg)  06/27/16 159 lb  (72.1 kg)    Ocular: Sclerae unicteric, pupils equal, round and reactive to light Ear-nose-throat: Oropharynx clear, dentition fair Lymphatic: No cervical, supraclavicular or axillary adenopathy Lungs no rales or rhonchi, good excursion bilaterally Heart regular rate and rhythm, no murmur appreciated Abd soft, nontender, positive bowel sounds, no liver or spleen tip palpated on exam, no fluid wave MSK no focal spinal tenderness, no joint edema Neuro: non-focal, well-oriented, appropriate affect Breasts: Deferred   Lab Results  Component Value Date   WBC 6.9 03/11/2017   HGB 14.4 03/11/2017   HCT 42.3 03/11/2017   MCV 96 03/11/2017   PLT 201 03/11/2017   Lab Results  Component Value Date   FERRITIN 61 10/09/2016   IRON 169 (H) 10/09/2016   TIBC 245 10/09/2016   UIBC 76 (L) 10/09/2016   IRONPCTSAT 69 (H) 10/09/2016   Lab Results  Component Value Date   RBC 4.39 03/11/2017   No results found for: KPAFRELGTCHN, LAMBDASER, KAPLAMBRATIO No results found for: IGGSERUM, IGA, IGMSERUM No results found for: Odetta Pink, SPEI   Chemistry      Component Value Date/Time   NA 139 07/02/2016 1139   K 4.1 07/02/2016 1139   CL 105 02/27/2016 1112   CO2 26 07/02/2016 1139   BUN 20.3 07/02/2016 1139   CREATININE 0.8 07/02/2016 1139      Component Value Date/Time   CALCIUM 10.0 07/02/2016 1139   ALKPHOS 87 07/02/2016 1139   AST 27 07/02/2016 1139   ALT 34 07/02/2016 1139   BILITOT 0.55 07/02/2016 1139      Impression and Plan: Lori [name] is a very pleasant 71 yo female with hemochromatosis, heterozygous for the C282Y mutation. She is symptomatic with dizziness and mild blurred vision. She states that she feels that she needs to be phlebotomized today.  We will proceed with phlebotomy today as planned.  W will plan to see her back again in another 3 months for repeat lab work and follow-up.  Greater than 50% of her 15 minute  face to face visit was spent counseling and coordinating care.  She will contact our office with any questions or concerns. We can certainly see her sooner if need be.   Eliezer Bottom, NP 10/17/20182:16 PM

## 2017-03-11 NOTE — Patient Instructions (Signed)
Therapeutic Phlebotomy Therapeutic phlebotomy is the controlled removal of blood from a person's body for the purpose of treating a medical condition. The procedure is similar to donating blood. Usually, about a pint (470 mL, or 0.47L) of blood is removed. The average adult has 9-12 pints (4.3-5.7 L) of blood. Therapeutic phlebotomy may be used to treat the following medical conditions:  Hemochromatosis. This is a condition in which the blood contains too much iron.  Polycythemia vera. This is a condition in which the blood contains too many red blood cells.  Porphyria cutanea tarda. This is a disease in which an important part of hemoglobin is not made properly. It results in the buildup of abnormal amounts of porphyrins in the body.  Sickle cell disease. This is a condition in which the red blood cells form an abnormal crescent shape rather than a round shape.  Tell a health care provider about:  Any allergies you have.  All medicines you are taking, including vitamins, herbs, eye drops, creams, and over-the-counter medicines.  Any problems you or family members have had with anesthetic medicines.  Any blood disorders you have.  Any surgeries you have had.  Any medical conditions you have. What are the risks? Generally, this is a safe procedure. However, problems may occur, including:  Nausea or light-headedness.  Low blood pressure.  Soreness, bleeding, swelling, or bruising at the needle insertion site.  Infection.  What happens before the procedure?  Follow instructions from your health care provider about eating or drinking restrictions.  Ask your health care provider about changing or stopping your regular medicines. This is especially important if you are taking diabetes medicines or blood thinners.  Wear clothing with sleeves that can be raised above the elbow.  Plan to have someone take you home after the procedure.  You may have a blood sample taken. What  happens during the procedure?  A needle will be inserted into one of your veins.  Tubing and a collection bag will be attached to that needle.  Blood will flow through the needle and tubing into the collection bag.  You may be asked to open and close your hand slowly and continually during the entire collection.  After the specified amount of blood has been removed from your body, the collection bag and tubing will be clamped.  The needle will be removed from your vein.  Pressure will be held on the site of the needle insertion to stop the bleeding.  A bandage (dressing) will be placed over the needle insertion site. The procedure may vary among health care providers and hospitals. What happens after the procedure?  Your recovery will be assessed and monitored.  You can return to your normal activities as directed by your health care provider. This information is not intended to replace advice given to you by your health care provider. Make sure you discuss any questions you have with your health care provider. Document Released: 10/14/2010 Document Revised: 01/12/2016 Document Reviewed: 05/08/2014 Elsevier Interactive Patient Education  2018 [employer]  

## 2017-03-12 LAB — IRON AND TIBC
%SAT: 58 % — AB (ref 21–57)
Iron: 141 ug/dL (ref 41–142)
TIBC: 243 ug/dL (ref 236–444)
UIBC: 101 ug/dL — AB (ref 120–384)

## 2017-03-12 LAB — FERRITIN: FERRITIN: 33 ng/mL (ref 9–269)

## 2017-03-18 DIAGNOSIS — D122 Benign neoplasm of ascending colon: Secondary | ICD-10-CM | POA: Diagnosis not present

## 2017-03-18 DIAGNOSIS — K6389 Other specified diseases of intestine: Secondary | ICD-10-CM | POA: Diagnosis not present

## 2017-03-18 DIAGNOSIS — D120 Benign neoplasm of cecum: Secondary | ICD-10-CM | POA: Diagnosis not present

## 2017-03-18 DIAGNOSIS — Z1211 Encounter for screening for malignant neoplasm of colon: Secondary | ICD-10-CM | POA: Diagnosis not present

## 2017-03-18 DIAGNOSIS — K5730 Diverticulosis of large intestine without perforation or abscess without bleeding: Secondary | ICD-10-CM | POA: Diagnosis not present

## 2017-03-18 DIAGNOSIS — Z800 Family history of malignant neoplasm of digestive organs: Secondary | ICD-10-CM | POA: Diagnosis not present

## 2017-03-18 DIAGNOSIS — K648 Other hemorrhoids: Secondary | ICD-10-CM | POA: Diagnosis not present

## 2017-03-24 DIAGNOSIS — R7989 Other specified abnormal findings of blood chemistry: Secondary | ICD-10-CM | POA: Diagnosis not present

## 2017-03-24 DIAGNOSIS — R945 Abnormal results of liver function studies: Secondary | ICD-10-CM | POA: Diagnosis not present

## 2017-04-02 ENCOUNTER — Ambulatory Visit: Payer: BLUE CROSS/BLUE SHIELD | Admitting: Podiatry

## 2017-04-09 DIAGNOSIS — D692 Other nonthrombocytopenic purpura: Secondary | ICD-10-CM | POA: Diagnosis not present

## 2017-04-09 DIAGNOSIS — L720 Epidermal cyst: Secondary | ICD-10-CM | POA: Diagnosis not present

## 2017-04-09 DIAGNOSIS — Z85828 Personal history of other malignant neoplasm of skin: Secondary | ICD-10-CM | POA: Diagnosis not present

## 2017-04-09 DIAGNOSIS — L821 Other seborrheic keratosis: Secondary | ICD-10-CM | POA: Diagnosis not present

## 2017-04-09 DIAGNOSIS — L820 Inflamed seborrheic keratosis: Secondary | ICD-10-CM | POA: Diagnosis not present

## 2017-04-09 DIAGNOSIS — L570 Actinic keratosis: Secondary | ICD-10-CM | POA: Diagnosis not present

## 2017-04-20 ENCOUNTER — Encounter: Payer: Self-pay | Admitting: Podiatry

## 2017-04-20 DIAGNOSIS — Z86010 Personal history of colonic polyps: Secondary | ICD-10-CM | POA: Insufficient documentation

## 2017-04-20 DIAGNOSIS — K5909 Other constipation: Secondary | ICD-10-CM | POA: Diagnosis not present

## 2017-04-20 DIAGNOSIS — Z800 Family history of malignant neoplasm of digestive organs: Secondary | ICD-10-CM | POA: Diagnosis not present

## 2017-04-20 DIAGNOSIS — R1030 Lower abdominal pain, unspecified: Secondary | ICD-10-CM | POA: Diagnosis not present

## 2017-04-27 ENCOUNTER — Ambulatory Visit: Payer: PPO | Admitting: Podiatry

## 2017-04-27 ENCOUNTER — Encounter: Payer: Self-pay | Admitting: Podiatry

## 2017-04-27 ENCOUNTER — Ambulatory Visit (INDEPENDENT_AMBULATORY_CARE_PROVIDER_SITE_OTHER): Payer: PPO

## 2017-04-27 DIAGNOSIS — M722 Plantar fascial fibromatosis: Secondary | ICD-10-CM

## 2017-04-27 DIAGNOSIS — M2042 Other hammer toe(s) (acquired), left foot: Secondary | ICD-10-CM

## 2017-04-27 MED ORDER — TRIAMCINOLONE ACETONIDE 10 MG/ML IJ SUSP: 10.0000 mg | Freq: Once | INTRAMUSCULAR | Status: AC

## 2017-04-27 NOTE — Progress Notes (Signed)
Subjective:   Patient ID: Lori [name], female   DOB: 71 y.o.   MRN: [id number]   HPI Patient states she's had a lot of pain in her heel left over right and it's been making it hard to walk. States it's been gradually getting worse and becoming more of an issue for her and she feels like the collapse of arch as a part of the problem   ROS      Objective:  Physical Exam  Neurovascular status intact muscle strength was adequate range of motion within normal limits with patient also having digital deformity fifth left. I noted plantar aspect left heel exquisite discomfort in the medial band of the heel at the insertional point of the tendon into the calcaneus with fluid buildup around the medial band     Assessment:  Acute plantar fasciitis left with inflammation and digital deformity fifth left with some improvement with medication. Reduction of the arch also part of the problem     Plan:  H&P condition reviewed and reviewed x-rays. Today I injected the plantar fascial left 3 mg Kenalog 5 mill grams Xylocaine and applied fascial brace to lift up the arch and take stress off the heel during healing phase. I then went ahead and instructed on physical therapy supportive shoes and I went ahead and do not recommend surgery for the fifth toe at this time  X-rays indicate there is spur formation but no indications of stress fracture arthritis

## 2017-04-27 NOTE — Patient Instructions (Signed)

## 2017-04-29 ENCOUNTER — Ambulatory Visit: Payer: Self-pay | Admitting: Obstetrics & Gynecology

## 2017-04-29 DIAGNOSIS — Z0289 Encounter for other administrative examinations: Secondary | ICD-10-CM

## 2017-05-08 ENCOUNTER — Other Ambulatory Visit: Payer: Self-pay | Admitting: Family

## 2017-05-13 ENCOUNTER — Ambulatory Visit (INDEPENDENT_AMBULATORY_CARE_PROVIDER_SITE_OTHER): Payer: PPO | Admitting: Podiatry

## 2017-05-13 ENCOUNTER — Encounter: Payer: Self-pay | Admitting: Podiatry

## 2017-05-13 DIAGNOSIS — D3610 Benign neoplasm of peripheral nerves and autonomic nervous system, unspecified: Secondary | ICD-10-CM

## 2017-05-13 DIAGNOSIS — M779 Enthesopathy, unspecified: Secondary | ICD-10-CM | POA: Diagnosis not present

## 2017-05-13 MED ORDER — TRIAMCINOLONE ACETONIDE 10 MG/ML IJ SUSP
10.0000 mg | Freq: Once | INTRAMUSCULAR | Status: AC
  Administered 2017-05-13: 10 mg

## 2017-05-13 NOTE — Progress Notes (Signed)
Patient presents stating her heel is feeling quite a bit better but she is got some discomfort in her tendon on the ankleSubjective:   Patient ID: Lori [name], female   DOB: 71 y.o.   MRN: [id number]   HPI And the nerve on the left foot is starting to become more bothersome and she would like it taken care of like the right one.   ROS      Objective:  Physical Exam  Neurovascular status intact with patient's left posterior tibial tendon found to be inflamed as it comes underneath the medial malleolus and discomfort third interspace left with radiating discomfort and positive Biagio Borg sign     Assessment:  Tendinitis posterior tibial tendon left with probable nerve entrapment third interspace left foot     Plan:  H&P reviewed both condition and careful sheath injection of the tendon is injected with 3 mg Kenalog 5 mg Xylocaine and I then did a nerve neural lysis injection of the third interspace with a purified alcohol Marcaine solution injection 1.3 cc with sterile dressing.  Reappoint 3 weeks to reevaluate

## 2017-05-21 ENCOUNTER — Other Ambulatory Visit: Payer: PPO

## 2017-05-28 DIAGNOSIS — K589 Irritable bowel syndrome without diarrhea: Secondary | ICD-10-CM | POA: Diagnosis not present

## 2017-05-28 DIAGNOSIS — N951 Menopausal and female climacteric states: Secondary | ICD-10-CM | POA: Diagnosis not present

## 2017-05-28 DIAGNOSIS — R7309 Other abnormal glucose: Secondary | ICD-10-CM | POA: Diagnosis not present

## 2017-05-28 DIAGNOSIS — R5381 Other malaise: Secondary | ICD-10-CM | POA: Diagnosis not present

## 2017-06-03 ENCOUNTER — Encounter: Payer: Self-pay | Admitting: Podiatry

## 2017-06-03 ENCOUNTER — Ambulatory Visit (INDEPENDENT_AMBULATORY_CARE_PROVIDER_SITE_OTHER): Payer: PPO | Admitting: Podiatry

## 2017-06-03 DIAGNOSIS — D3610 Benign neoplasm of peripheral nerves and autonomic nervous system, unspecified: Secondary | ICD-10-CM | POA: Diagnosis not present

## 2017-06-03 DIAGNOSIS — M2042 Other hammer toe(s) (acquired), left foot: Secondary | ICD-10-CM

## 2017-06-05 NOTE — Progress Notes (Signed)
Subjective:   Patient ID: Lori [name], female   DOB: 72 y.o.   MRN: [id number]   HPI Patient presents stating that the inside of the ankle is doing better after couple days of pain and it seems like the nerve was quite a bit better but seems to be bothering her again   ROS      Objective:  Physical Exam  Neurovascular status intact with significant diminishment of discomfort on the medial side of the foot around posterior tibial tendon with good health to it and muscle strength. There continues to be a palpable mass third interspace left with radiating discomforts that's present     Assessment:  Neuroma symptomatology left present with improve tendinitis     Plan:  Advised on continued conservative care and I went ahead today and I did do a sterile prep of the left forefoot and I injected directly into the nerve root with a purified alcohol Marcaine solution 1.5 mL for a sclerosis procedure. Reappoint to recheck in 2 weeks or earlier if needed

## 2017-06-11 DIAGNOSIS — D0439 Carcinoma in situ of skin of other parts of face: Secondary | ICD-10-CM | POA: Diagnosis not present

## 2017-06-11 DIAGNOSIS — D044 Carcinoma in situ of skin of scalp and neck: Secondary | ICD-10-CM | POA: Diagnosis not present

## 2017-06-11 DIAGNOSIS — D485 Neoplasm of uncertain behavior of skin: Secondary | ICD-10-CM | POA: Diagnosis not present

## 2017-06-11 DIAGNOSIS — L821 Other seborrheic keratosis: Secondary | ICD-10-CM | POA: Diagnosis not present

## 2017-06-11 DIAGNOSIS — Z85828 Personal history of other malignant neoplasm of skin: Secondary | ICD-10-CM | POA: Diagnosis not present

## 2017-06-17 ENCOUNTER — Ambulatory Visit (INDEPENDENT_AMBULATORY_CARE_PROVIDER_SITE_OTHER): Payer: PPO | Admitting: Podiatry

## 2017-06-17 ENCOUNTER — Encounter: Payer: Self-pay | Admitting: Podiatry

## 2017-06-17 ENCOUNTER — Other Ambulatory Visit: Payer: Self-pay | Admitting: Family Medicine

## 2017-06-17 DIAGNOSIS — K754 Autoimmune hepatitis: Secondary | ICD-10-CM

## 2017-06-17 DIAGNOSIS — M779 Enthesopathy, unspecified: Secondary | ICD-10-CM

## 2017-06-17 DIAGNOSIS — D3610 Benign neoplasm of peripheral nerves and autonomic nervous system, unspecified: Secondary | ICD-10-CM | POA: Diagnosis not present

## 2017-06-18 ENCOUNTER — Inpatient Hospital Stay: Payer: PPO | Admitting: Family

## 2017-06-18 ENCOUNTER — Inpatient Hospital Stay: Payer: PPO

## 2017-06-20 NOTE — Progress Notes (Signed)
Subjective:   Patient ID: Lori [name], female   DOB: 72 y.o.   MRN: [id number]   HPI Patient presents stating it still bothers her some in her left forefoot with mild improvement but quite a bit of discomfort still when I palpated the nerve.  There is mild discomfort within the arch bilateral and into the posterior tibial tendon group left   ROS      Objective:  Physical Exam  Neuroma symptoms that continue to be persistent left foot with history of having the right when corrected with injection treatment with moderate tendinitis symptoms     Assessment:  H&P and reviewed neuroma with patient along with tendinitis     Plan:  At this time continue conservative care and I did explain ultimately we may need to excise this nerve but she did respond on the right foot.  I did a sterile prep of the left forefoot I then injected directly into the nerve root with a purified alcohol Marcaine solution and advised on wider shoes and support.  Reappoint 3 weeks or earlier if needed

## 2017-06-22 ENCOUNTER — Ambulatory Visit (INDEPENDENT_AMBULATORY_CARE_PROVIDER_SITE_OTHER): Payer: PPO | Admitting: Podiatry

## 2017-06-22 ENCOUNTER — Encounter: Payer: Self-pay | Admitting: Podiatry

## 2017-06-22 DIAGNOSIS — D3610 Benign neoplasm of peripheral nerves and autonomic nervous system, unspecified: Secondary | ICD-10-CM | POA: Diagnosis not present

## 2017-06-22 DIAGNOSIS — L309 Dermatitis, unspecified: Secondary | ICD-10-CM | POA: Diagnosis not present

## 2017-06-22 MED ORDER — DOXYCYCLINE HYCLATE 100 MG PO TABS: 100.0000 mg | ORAL_TABLET | Freq: Two times a day (BID) | ORAL | 0 refills | Status: DC

## 2017-06-23 ENCOUNTER — Ambulatory Visit
Admission: RE | Admit: 2017-06-23 | Discharge: 2017-06-23 | Disposition: A | Discharge status: Home / Self Care | Payer: PPO | Source: Ambulatory Visit | Attending: Family Medicine | Admitting: Family Medicine

## 2017-06-23 DIAGNOSIS — K7689 Other specified diseases of liver: Secondary | ICD-10-CM | POA: Diagnosis not present

## 2017-06-23 DIAGNOSIS — K754 Autoimmune hepatitis: Secondary | ICD-10-CM

## 2017-06-24 ENCOUNTER — Encounter: Payer: Self-pay | Admitting: Family

## 2017-06-24 ENCOUNTER — Inpatient Hospital Stay (HOSPITAL_BASED_OUTPATIENT_CLINIC_OR_DEPARTMENT_OTHER): Payer: PPO | Admitting: Family

## 2017-06-24 ENCOUNTER — Inpatient Hospital Stay: Payer: PPO | Attending: Hematology & Oncology

## 2017-06-24 ENCOUNTER — Inpatient Hospital Stay: Payer: PPO

## 2017-06-24 ENCOUNTER — Other Ambulatory Visit: Payer: Self-pay

## 2017-06-24 VITALS — BP 108/60 | HR 76 | Temp 98.6°F | Resp 20 | Wt 161.0 lb

## 2017-06-24 DIAGNOSIS — R5383 Other fatigue: Secondary | ICD-10-CM | POA: Insufficient documentation

## 2017-06-24 DIAGNOSIS — R140 Abdominal distension (gaseous): Secondary | ICD-10-CM

## 2017-06-24 DIAGNOSIS — Z880 Allergy status to penicillin: Secondary | ICD-10-CM | POA: Diagnosis not present

## 2017-06-24 DIAGNOSIS — Z885 Allergy status to narcotic agent status: Secondary | ICD-10-CM

## 2017-06-24 DIAGNOSIS — Z91013 Allergy to seafood: Secondary | ICD-10-CM | POA: Diagnosis not present

## 2017-06-24 DIAGNOSIS — Z79899 Other long term (current) drug therapy: Secondary | ICD-10-CM | POA: Insufficient documentation

## 2017-06-24 DIAGNOSIS — R7989 Other specified abnormal findings of blood chemistry: Secondary | ICD-10-CM

## 2017-06-24 DIAGNOSIS — K5900 Constipation, unspecified: Secondary | ICD-10-CM | POA: Diagnosis not present

## 2017-06-24 DIAGNOSIS — Z881 Allergy status to other antibiotic agents status: Secondary | ICD-10-CM | POA: Diagnosis not present

## 2017-06-24 LAB — CBC WITH DIFFERENTIAL (CANCER CENTER ONLY)
BASOS PCT: 1 %
Basophils Absolute: 0 10*3/uL (ref 0.0–0.1)
EOS PCT: 1 %
Eosinophils Absolute: 0.1 10*3/uL (ref 0.0–0.5)
HCT: 43.4 % (ref 34.8–46.6)
Hemoglobin: 14.7 g/dL (ref 11.6–15.9)
Lymphocytes Relative: 42 %
Lymphs Abs: 2.4 10*3/uL (ref 0.9–3.3)
MCH: 32.2 pg (ref 26.0–34.0)
MCHC: 33.9 g/dL (ref 32.0–36.0)
MCV: 95.2 fL (ref 81.0–101.0)
MONO ABS: 0.6 10*3/uL (ref 0.1–0.9)
MONOS PCT: 11 %
Neutro Abs: 2.6 10*3/uL (ref 1.5–6.5)
Neutrophils Relative %: 45 %
PLATELETS: 209 10*3/uL (ref 145–400)
RBC: 4.56 MIL/uL (ref 3.70–5.32)
RDW: 13 % (ref 11.1–15.7)
WBC Count: 5.8 10*3/uL (ref 3.9–10.3)

## 2017-06-24 LAB — CMP (CANCER CENTER ONLY)
ALT: 27 U/L (ref 0–55)
ANION GAP: 9 (ref 3–11)
AST: 28 U/L (ref 5–34)
Albumin: 4 g/dL (ref 3.5–5.0)
Alkaline Phosphatase: 69 U/L (ref 40–150)
BUN: 15 mg/dL (ref 7–26)
CHLORIDE: 105 mmol/L (ref 98–109)
CO2: 26 mmol/L (ref 22–29)
CREATININE: 0.72 mg/dL (ref 0.60–1.10)
Calcium: 9.3 mg/dL (ref 8.4–10.4)
Glucose, Bld: 94 mg/dL (ref 70–140)
POTASSIUM: 3.7 mmol/L (ref 3.5–5.1)
SODIUM: 140 mmol/L (ref 136–145)
Total Bilirubin: 0.4 mg/dL (ref 0.2–1.2)
Total Protein: 6.9 g/dL (ref 6.4–8.3)

## 2017-06-24 LAB — IRON AND TIBC
IRON: 149 ug/dL — AB (ref 41–142)
SATURATION RATIOS: 63 % — AB (ref 21–57)
TIBC: 236 ug/dL (ref 236–444)
UIBC: 87 ug/dL

## 2017-06-24 LAB — FERRITIN: FERRITIN: 45 ng/mL (ref 9–269)

## 2017-06-24 NOTE — Progress Notes (Signed)
Subjective:   Patient ID: Lori [name], female   DOB: 72 y.o.   MRN: [id number]   HPI Patient presents stating she is developed some redness on the inside of her left ankle and she did have vein treatments and thinks it occurred after this and also continues to have pain third interspace left   ROS      Objective:  Physical Exam  Neurovascular status unchanged negative Homans sign was noted with discoloration of the left ankle around the area of the varicosities had been treated with continued discomfort third interspace left     Assessment:  Appears to be some form of dermatitis or other unknown soft tissue condition with slight possibility of infection with patient also having continued neuroma symptomatology that may need to be faxed     Plan:  Reviewed condition and at this time I have recommended antibiotics and placed on cephalexin 500 mg 3 times daily and advised her to see the doctor who did her vein surgery.  She will be seen back for regular appointment concerning neuroma and again may require excision

## 2017-06-24 NOTE — Progress Notes (Signed)
Lori [name] presents today for phlebotomy per MD orders. Phlebotomy procedure started at 1410 and ended at 1440. Cannulated with 20G needle. 147mls removed. Patient observed for 30 minutes after procedure without any incident. Patient tolerated procedure well. Due to poor blood return, patient advised to rescheule. Verbalized understanding. IV needle removed intact.

## 2017-06-24 NOTE — Progress Notes (Signed)
Hematology and Oncology Follow Up Visit  Lori [name] [id number] [birth year] 72 y.o. 06/24/2017   Principle Diagnosis:  Hemochromatosis - heterozygous for the C282Y mutation   Current Therapy:   Phlebotomy as indicated to keep ferritin <35  (changed per her request 06/24/17)   Interim History:  Lori [name] is here today for follow-up. She is symptomatic with fatigue, bloating when she eats, low grade headaches, blurred vision, constipation and increased joint aches and pains. Her ferritin was checked last week and was 56%. She states that she was told by her gastroenterologist that her ferritin needs to be under 35. She states that she bruises easily and takes vitamin K2 daily. She has had no episodes of bleeding.  She had a recent bladder infection and completed treatment with an antibiotic.  She has a rash on the inner side of her left foot and is taking doxycycline.  No fever, chills, n/v, cough, rash, dizziness, SOB, chest pain, palpitations, abdominal pain or changes in bladder habits.  She has chronic back issues and has intermittent numbness and tingling in her hands and feet. No swelling in her extremities at this time.  She has maintained a healthy diet and is staying well hydrated. Her weight is stable.   ECOG Performance Status: 1 - Symptomatic but completely ambulatory  Medications:  Allergies as of 06/24/2017      Reactions   Albumin (human)    Pt stated, "I am not allergic to this and don't know what this means"; 04/27/17   Amoxicillin Nausea And Vomiting   Codeine Nausea Only   Erythromycin Nausea Only   Iodinated Diagnostic Agents Other (See Comments)   Keflex [cephalexin] Itching   Meperidine    Pt stated, "I don't know what this is"; 04/27/17   Metoclopramide Hcl    Other Other (See Comments)   Beef Chicken Dairy Eggs Pt stated, "My body can't process protein; it makes me sick"   Shellfish Allergy Other (See Comments)   Pt stated, "I am not allergic to this  anymore"; 04/27/17   Shellfish-derived Products    Oxycodone Nausea And Vomiting   Pt stated, "I do not know if I am allergic to this"; 04/27/17      Medication List        Accurate as of 06/24/17 11:34 AM. Always use your most recent med list.          Biotin 5 MG Caps Take 5 mg by mouth.   BORON PO Take by mouth.   calcium acetate 667 MG capsule Commonly known as:  PHOSLO Take by mouth 3 (three) times daily with meals.   CO Q 10 PO Take 1 tablet by mouth daily.   dextroamphetamine 5 MG 24 hr capsule Commonly known as:  DEXEDRINE SPANSULE Take 5 mg by mouth daily.   doxycycline 100 MG tablet Commonly known as:  VIBRA-TABS   doxycycline 100 MG tablet Commonly known as:  VIBRA-TABS Take 1 tablet (100 mg total) by mouth 2 (two) times daily.   fish oil-omega-3 fatty acids 1000 MG capsule Take 1 g by mouth daily.   fluconazole 100 MG tablet Commonly known as:  DIFLUCAN Take 100 mg by mouth.   GLUTAMINE PO Take 1 tablet by mouth 2 (two) times daily.   LYSINE PO Take 1 tablet by mouth 2 (two) times daily.   MELATONIN PO Take 2 mg by mouth at bedtime.   metoprolol succinate 25 MG 24 hr tablet Commonly known as:  TOPROL-XL TAKE 1  TABLET IN AM AND 1/2 TABLET IN THE PM.   MSM Powd Take by mouth.   multivitamin tablet Take 1 tablet by mouth daily.   NATURE-THROID 97.5 MG Tabs Generic drug:  Thyroid Take 1 tablet by mouth daily.   NON FORMULARY Bio-est Cream   NON FORMULARY Pregnolol cap   NONFORMULARY OR COMPOUNDED ITEM Boric Acid 600 mg caps. S: insert vaginally twice daily x 2 weeks then twice weekly to complete 3 mos of treatment.   potassium chloride SA 20 MEQ tablet Commonly known as:  K-DUR,KLOR-CON Take 1 tablet by mouth daily.   Prasterone (DHEA) 10 MG Caps Take 7.5 mg by mouth daily.   progesterone 200 MG capsule Commonly known as:  PROMETRIUM Take 200 mg by mouth daily.   vitamin C 1000 MG tablet Take 1,000 mg by mouth 3 (three)  times daily.   VITAMIN D PO Take 1 tablet by mouth daily.   VITAMIN K2 PO Take by mouth once.       Allergies:  Allergies  Allergen Reactions  . Albumin (Human)     Pt stated, "I am not allergic to this and don't know what this means"; 04/27/17  . Amoxicillin Nausea And Vomiting  . Codeine Nausea Only  . Erythromycin Nausea Only  . Iodinated Diagnostic Agents Other (See Comments)  . Keflex [Cephalexin] Itching  . Meperidine     Pt stated, "I don't know what this is"; 04/27/17  . Metoclopramide Hcl   . Other Other (See Comments)    Beef Chicken Dairy Eggs  Pt stated, "My body can't process protein; it makes me sick"  . Shellfish Allergy Other (See Comments)    Pt stated, "I am not allergic to this anymore"; 04/27/17  . Shellfish-Derived Products   . Oxycodone Nausea And Vomiting    Pt stated, "I do not know if I am allergic to this"; 04/27/17    Past Medical History, Surgical history, Social history, and Family History were reviewed and updated.  Review of Systems: All other 10 point review of systems is negative.   Physical Exam:  vitals were not taken for this visit.   Wt Readings from Last 3 Encounters:  03/11/17 162 lb (73.5 kg)  10/10/16 162 lb 1.3 oz (73.5 kg)  07/02/16 162 lb (73.5 kg)    Ocular: Sclerae unicteric, pupils equal, round and reactive to light Ear-nose-throat: Oropharynx clear, dentition fair Lymphatic: No cervical, supraclavicular or axillary adenopathy Lungs no rales or rhonchi, good excursion bilaterally Heart regular rate and rhythm, no murmur appreciated Abd soft, nontender, positive bowel sounds, no liver or spleen tip palpated on exam, no fluid wave  MSK no focal spinal tenderness, no joint edema Neuro: non-focal, well-oriented, appropriate affect Breasts: Deferred   Lab Results  Component Value Date   WBC 5.8 06/24/2017   HGB 14.4 03/11/2017   HCT 43.4 06/24/2017   MCV 95.2 06/24/2017   PLT 209 06/24/2017   Lab Results    Component Value Date   FERRITIN 33 03/11/2017   IRON 141 03/11/2017   TIBC 243 03/11/2017   UIBC 101 (L) 03/11/2017   IRONPCTSAT 58 (H) 03/11/2017   Lab Results  Component Value Date   RBC 4.56 06/24/2017   No results found for: KPAFRELGTCHN, LAMBDASER, KAPLAMBRATIO No results found for: IGGSERUM, IGA, IGMSERUM No results found for: TOTALPROTELP, ALBUMINELP, A1GS, A2GS, BETS, BETA2SER, GAMS, MSPIKE, SPEI   Chemistry      Component Value Date/Time   NA 143 03/11/2017 1404  NA 139 07/02/2016 1139   K 3.9 03/11/2017 1404   K 4.1 07/02/2016 1139   CL 105 03/11/2017 1404   CO2 31 03/11/2017 1404   CO2 26 07/02/2016 1139   BUN 15 03/11/2017 1404   BUN 20.3 07/02/2016 1139   CREATININE 0.7 03/11/2017 1404   CREATININE 0.8 07/02/2016 1139      Component Value Date/Time   CALCIUM 9.4 03/11/2017 1404   CALCIUM 10.0 07/02/2016 1139   ALKPHOS 69 03/11/2017 1404   ALKPHOS 87 07/02/2016 1139   AST 31 03/11/2017 1404   AST 27 07/02/2016 1139   ALT 32 03/11/2017 1404   ALT 34 07/02/2016 1139   BILITOT 0.60 03/11/2017 1404   BILITOT 0.55 07/02/2016 1139      Impression and Plan: Ms. Ellwanger is a 72 yo caucasian female with hemochromatosis, heterozygous for the C282Y mutation. She is quite symptomatic as mentioned above. Ferritin is 56.  We will phlebotomize her today and plan to recheck her labs in 4 weeks.  We will plan to see her back for follow-up and lab in 8 weeks.  She will contact our office with any questions or concerns. We can certainly see him sooner if need be.   Laverna Peace, NP 1/30/201911:34 AM

## 2017-06-25 DIAGNOSIS — Z85828 Personal history of other malignant neoplasm of skin: Secondary | ICD-10-CM | POA: Diagnosis not present

## 2017-06-25 DIAGNOSIS — L988 Other specified disorders of the skin and subcutaneous tissue: Secondary | ICD-10-CM | POA: Diagnosis not present

## 2017-06-25 DIAGNOSIS — C44329 Squamous cell carcinoma of skin of other parts of face: Secondary | ICD-10-CM | POA: Diagnosis not present

## 2017-06-26 DIAGNOSIS — M79605 Pain in left leg: Secondary | ICD-10-CM | POA: Diagnosis not present

## 2017-06-26 DIAGNOSIS — C801 Malignant (primary) neoplasm, unspecified: Secondary | ICD-10-CM

## 2017-06-26 HISTORY — DX: Malignant (primary) neoplasm, unspecified: C80.1

## 2017-06-30 ENCOUNTER — Inpatient Hospital Stay: Payer: PPO | Attending: Hematology & Oncology

## 2017-06-30 NOTE — Progress Notes (Signed)
Lori [name] presents today for phlebotomy per MD orders. Phlebotomy procedure started at 2:30 pm and ended at 2:54pm. 600 grams removed. Patient observed for 30 minutes after procedure without any incident. Patient tolerated procedure well. IV needle removed intact.

## 2017-07-02 ENCOUNTER — Encounter: Payer: BLUE CROSS/BLUE SHIELD | Admitting: Obstetrics & Gynecology

## 2017-07-07 ENCOUNTER — Encounter: Payer: Self-pay | Admitting: Obstetrics & Gynecology

## 2017-07-07 ENCOUNTER — Ambulatory Visit: Payer: PPO | Admitting: Obstetrics & Gynecology

## 2017-07-07 VITALS — BP 128/86 | Ht 64.0 in | Wt 158.0 lb

## 2017-07-07 DIAGNOSIS — Z01411 Encounter for gynecological examination (general) (routine) with abnormal findings: Secondary | ICD-10-CM

## 2017-07-07 DIAGNOSIS — Z780 Asymptomatic menopausal state: Secondary | ICD-10-CM | POA: Diagnosis not present

## 2017-07-07 DIAGNOSIS — M810 Age-related osteoporosis without current pathological fracture: Secondary | ICD-10-CM | POA: Diagnosis not present

## 2017-07-07 DIAGNOSIS — Z124 Encounter for screening for malignant neoplasm of cervix: Secondary | ICD-10-CM

## 2017-07-07 DIAGNOSIS — R102 Pelvic and perineal pain: Secondary | ICD-10-CM | POA: Diagnosis not present

## 2017-07-07 NOTE — Progress Notes (Signed)
Lori [name] 1946-01-13 [id number]   History:    72 y.o. G4P3A1 Married  RP: Established patient presenting for annual gyn exam   HPI:  Menopause on HRT with Dr Demetrio Lapping ([facility]).  She is on estrogen cream 0.125 daily and Prometrium 100 mg at bedtime.  She also takes DHEA and pregnenolone.  No PMB.  Intermittent pelvic pain.  No pain with IC.  Less physically active recently due to Plantar Fasciitis.  Received Reclast x 1 with Dr Marin Olp for Osteoporosis T-Score -2.6.  Decided to stop Reclast.  Will need a repeat Bone Density.  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 Urine/BMs wnl currently.  H/O frequent UTIs.  Breasts wnl.  Labs with Fam MD.   Past medical history,surgical history, family history and social history were all reviewed and documented in the EPIC chart.  Gynecologic History No LMP recorded. Patient is postmenopausal. Contraception: post menopausal status and tubal ligation and BSO Last Pap: 06/2016. Results were: Negative, HPV HR neg Last mammogram: 05/2016. Results were: Negative Bone Density: 06/2015 Osteoporosis Rt femoral neck T-Score -2.6 Colonoscopy: 2018  Obstetric History OB History  Gravida Para Term Preterm AB Living  4 3 3   1 3   SAB TAB Ectopic Multiple Live Births  1       3    # Outcome Date GA Lbr Len/2nd Weight Sex Delivery Anes PTL Lv  4 SAB           3 Term     F Vag-Spont  N LIV  2 Term     M Vag-Spont  N LIV  1 Term     F Vag-Spont  N LIV       ROS: A ROS was performed and pertinent positives and negatives are included in the history.  GENERAL: No fevers or chills. HEENT: No change in vision, no earache, sore throat or sinus congestion. NECK: No pain or stiffness. CARDIOVASCULAR: No chest pain or pressure. No palpitations. PULMONARY: No shortness of breath, cough or wheeze. GASTROINTESTINAL: No abdominal pain, nausea, vomiting or diarrhea, melena or bright red blood per rectum. GENITOURINARY: No urinary  frequency, urgency, hesitancy or dysuria. MUSCULOSKELETAL: No joint or muscle pain, no back pain, no recent trauma. DERMATOLOGIC: No rash, no itching, no lesions. ENDOCRINE: No polyuria, polydipsia, no heat or cold intolerance. No recent change in weight. HEMATOLOGICAL: No anemia or easy bruising or bleeding. NEUROLOGIC: No headache, seizures, numbness, tingling or weakness. PSYCHIATRIC: No depression, no loss of interest in normal activity or change in sleep pattern.     Exam:   BP 128/86   Ht 5\' 4"  (1.626 m)   Wt 158 lb (71.7 kg)   BMI 27.12 kg/m   Body mass index is 27.12 kg/m.  General appearance : Well developed well nourished female. No acute distress HEENT: Eyes: no retinal hemorrhage or exudates,  Neck supple, trachea midline, no carotid bruits, no thyroidmegaly Lungs: Clear to auscultation, no rhonchi or wheezes, or rib retractions  Heart: Regular rate and rhythm, no murmurs or gallops Breast:Examined in sitting and supine position were symmetrical in appearance, no palpable masses or tenderness,  no skin retraction, no nipple inversion, no nipple discharge, no skin discoloration, no axillary or supraclavicular lymphadenopathy Abdomen: no palpable masses or tenderness, no rebound or guarding Extremities: no edema or skin discoloration or tenderness  Pelvic: Vulva: Normal             Vagina: No gross lesions or discharge  Cervix: No gross lesions or discharge.  Pap reflex done.  Uterus  AV,  normal size, shape and consistency, non-tender and mobile  Adnexa  Without masses or tenderness  Anus: Normal   Assessment/Plan:  72 y.o. female for annual exam   1. Encounter for gynecological examination with abnormal finding Normal gynecologic exam.  Pap reflex done.  Breast exam normal.  Will schedule screening mammogram now.  Labs with family physician.  2. Menopause present Followed by an Integrative MD prescribing HRT.  Recommend stopping hormone replacement therapy at this  point because of increased risk of breast cancer and strokes/blood clots.  3. Age-related osteoporosis without current pathological fracture Vitamin D supplements, calcium rich nutrition and weightbearing physical activity recommended.  Repeat bone density now. - DG Bone Density; Future  4. Pelvic pain in female Intermittent pelvic pain.  Negative gynecologic exam today.  Will follow up for pelvic ultrasound. - US Transvaginal Non-OB; Future  Counseling on above issues more than 50% for 15 minutes.  Princess Bruins MD, 3:40 PM 07/07/2017

## 2017-07-08 ENCOUNTER — Ambulatory Visit: Payer: PPO | Admitting: Podiatry

## 2017-07-10 LAB — PAP IG W/ RFLX HPV ASCU

## 2017-07-11 ENCOUNTER — Encounter: Payer: Self-pay | Admitting: Obstetrics & Gynecology

## 2017-07-11 NOTE — Patient Instructions (Addendum)
1. Encounter for gynecological examination with abnormal finding Normal gynecologic exam.  Pap reflex done.  Breast exam normal.  Will schedule screening mammogram now.  Labs with family physician.  2. Menopause present Followed by an Integrative MD prescribing HRT.  Recommend stopping hormone replacement therapy at this point because of increased risk of breast cancer and strokes/blood clots.  3. Age-related osteoporosis without current pathological fracture Vitamin D supplements, calcium rich nutrition and weightbearing physical activity recommended.  Repeat bone density now. - DG Bone Density; Future  4. Pelvic pain in female Intermittent pelvic pain.  Negative gynecologic exam today.  Will follow up for pelvic ultrasound. - US Transvaginal Non-OB; Future  Lori [name], it was a pleasure meeting you today!  I will inform you of your results as soon as they are available.   Health Maintenance for Postmenopausal Women Menopause is a normal process in which your reproductive ability comes to an end. This process happens gradually over a span of months to years, usually between the ages of 96 and 29. Menopause is complete when you have missed 12 consecutive menstrual periods. It is important to talk with your health care provider about some of the most common conditions that affect postmenopausal women, such as heart disease, cancer, and bone loss (osteoporosis). Adopting a healthy lifestyle and getting preventive care can help to promote your health and wellness. Those actions can also lower your chances of developing some of these common conditions. What should I know about menopause? During menopause, you may experience a number of symptoms, such as:  Moderate-to-severe hot flashes.  Night sweats.  Decrease in sex drive.  Mood swings.  Headaches.  Tiredness.  Irritability.  Memory problems.  Insomnia.  Choosing to treat or not to treat menopausal changes is an individual decision  that you make with your health care provider. What should I know about hormone replacement therapy and supplements? Hormone therapy products are effective for treating symptoms that are associated with menopause, such as hot flashes and night sweats. Hormone replacement carries certain risks, especially as you become older. If you are thinking about using estrogen or estrogen with progestin treatments, discuss the benefits and risks with your health care provider. What should I know about heart disease and stroke? Heart disease, heart attack, and stroke become more likely as you age. This may be due, in part, to the hormonal changes that your body experiences during menopause. These can affect how your body processes dietary fats, triglycerides, and cholesterol. Heart attack and stroke are both medical emergencies. There are many things that you can do to help prevent heart disease and stroke:  Have your blood pressure checked at least every 1-2 years. High blood pressure causes heart disease and increases the risk of stroke.  If you are 25-28 years old, ask your health care provider if you should take aspirin to prevent a heart attack or a stroke.  Do not use any tobacco products, including cigarettes, chewing tobacco, or electronic cigarettes. If you need help quitting, ask your health care provider.  It is important to eat a healthy diet and maintain a healthy weight. ? Be sure to include plenty of vegetables, fruits, low-fat dairy products, and lean protein. ? Avoid eating foods that are high in solid fats, added sugars, or salt (sodium).  Get regular exercise. This is one of the most important things that you can do for your health. ? Try to exercise for at least 150 minutes each week. The type of exercise that  you do should increase your heart rate and make you sweat. This is known as moderate-intensity exercise. ? Try to do strengthening exercises at least twice each week. Do these in  addition to the moderate-intensity exercise.  Know your numbers.Ask your health care provider to check your cholesterol and your blood glucose. Continue to have your blood tested as directed by your health care provider.  What should I know about cancer screening? There are several types of cancer. Take the following steps to reduce your risk and to catch any cancer development as early as possible. Breast Cancer  Practice breast self-awareness. ? This means understanding how your breasts normally appear and feel. ? It also means doing regular breast self-exams. Let your health care provider know about any changes, no matter how small.  If you are 21 or older, have a clinician do a breast exam (clinical breast exam or CBE) every year. Depending on your age, family history, and medical history, it may be recommended that you also have a yearly breast X-ray (mammogram).  If you have a family history of breast cancer, talk with your health care provider about genetic screening.  If you are at high risk for breast cancer, talk with your health care provider about having an MRI and a mammogram every year.  Breast cancer (BRCA) gene test is recommended for women who have family members with BRCA-related cancers. Results of the assessment will determine the need for genetic counseling and BRCA1 and for BRCA2 testing. BRCA-related cancers include these types: ? Breast. This occurs in males or females. ? Ovarian. ? Tubal. This may also be called fallopian tube cancer. ? Cancer of the abdominal or pelvic lining (peritoneal cancer). ? Prostate. ? Pancreatic.  Cervical, Uterine, and Ovarian Cancer Your health care provider may recommend that you be screened regularly for cancer of the pelvic organs. These include your ovaries, uterus, and vagina. This screening involves a pelvic exam, which includes checking for microscopic changes to the surface of your cervix (Pap test).  For women ages 21-65,  health care providers may recommend a pelvic exam and a Pap test every three years. For women ages 77-65, they may recommend the Pap test and pelvic exam, combined with testing for human papilloma virus (HPV), every five years. Some types of HPV increase your risk of cervical cancer. Testing for HPV may also be done on women of any age who have unclear Pap test results.  Other health care providers may not recommend any screening for nonpregnant women who are considered low risk for pelvic cancer and have no symptoms. Ask your health care provider if a screening pelvic exam is right for you.  If you have had past treatment for cervical cancer or a condition that could lead to cancer, you need Pap tests and screening for cancer for at least 20 years after your treatment. If Pap tests have been discontinued for you, your risk factors (such as having a new sexual partner) need to be reassessed to determine if you should start having screenings again. Some women have medical problems that increase the chance of getting cervical cancer. In these cases, your health care provider may recommend that you have screening and Pap tests more often.  If you have a family history of uterine cancer or ovarian cancer, talk with your health care provider about genetic screening.  If you have vaginal bleeding after reaching menopause, tell your health care provider.  There are currently no reliable tests available to screen  for ovarian cancer.  Lung Cancer Lung cancer screening is recommended for adults 83-78 years old who are at high risk for lung cancer because of a history of smoking. A yearly low-dose CT scan of the lungs is recommended if you:  Currently smoke.  Have a history of at least 30 pack-years of smoking and you currently smoke or have quit within the past 15 years. A pack-year is smoking an average of one pack of cigarettes per day for one year.  Yearly screening should:  Continue until it has been  15 years since you quit.  Stop if you develop a health problem that would prevent you from having lung cancer treatment.  Colorectal Cancer  This type of cancer can be detected and can often be prevented.  Routine colorectal cancer screening usually begins at age 58 and continues through age 30.  If you have risk factors for colon cancer, your health care provider may recommend that you be screened at an earlier age.  If you have a family history of colorectal cancer, talk with your health care provider about genetic screening.  Your health care provider may also recommend using home test kits to check for hidden blood in your stool.  A small camera at the end of a tube can be used to examine your colon directly (sigmoidoscopy or colonoscopy). This is done to check for the earliest forms of colorectal cancer.  Direct examination of the colon should be repeated every 5-10 years until age 75. However, if early forms of precancerous polyps or small growths are found or if you have a family history or genetic risk for colorectal cancer, you may need to be screened more often.  Skin Cancer  Check your skin from head to toe regularly.  Monitor any moles. Be sure to tell your health care provider: ? About any new moles or changes in moles, especially if there is a change in a mole's shape or color. ? If you have a mole that is larger than the size of a pencil eraser.  If any of your family members has a history of skin cancer, especially at a young age, talk with your health care provider about genetic screening.  Always use sunscreen. Apply sunscreen liberally and repeatedly throughout the day.  Whenever you are outside, protect yourself by wearing long sleeves, pants, a wide-brimmed hat, and sunglasses.  What should I know about osteoporosis? Osteoporosis is a condition in which bone destruction happens more quickly than new bone creation. After menopause, you may be at an increased  risk for osteoporosis. To help prevent osteoporosis or the bone fractures that can happen because of osteoporosis, the following is recommended:  If you are 31-32 years old, get at least 1,000 mg of calcium and at least 600 mg of vitamin D per day.  If you are older than age 65 but younger than age 73, get at least 1,200 mg of calcium and at least 600 mg of vitamin D per day.  If you are older than age 53, get at least 1,200 mg of calcium and at least 800 mg of vitamin D per day.  Smoking and excessive alcohol intake increase the risk of osteoporosis. Eat foods that are rich in calcium and vitamin D, and do weight-bearing exercises several times each week as directed by your health care provider. What should I know about how menopause affects my mental health? Depression may occur at any age, but it is more common as you become  older. Common symptoms of depression include:  Low or sad mood.  Changes in sleep patterns.  Changes in appetite or eating patterns.  Feeling an overall lack of motivation or enjoyment of activities that you previously enjoyed.  Frequent crying spells.  Talk with your health care provider if you think that you are experiencing depression. What should I know about immunizations? It is important that you get and maintain your immunizations. These include:  Tetanus, diphtheria, and pertussis (Tdap) booster vaccine.  Influenza every year before the flu season begins.  Pneumonia vaccine.  Shingles vaccine.  Your health care provider may also recommend other immunizations. This information is not intended to replace advice given to you by your health care provider. Make sure you discuss any questions you have with your health care provider. Document Released: 07/04/2005 Document Revised: 11/30/2015 Document Reviewed: 02/13/2015 Elsevier Interactive Patient Education  2018 [employer].

## 2017-07-13 ENCOUNTER — Telehealth: Payer: Self-pay | Admitting: *Deleted

## 2017-07-13 NOTE — Telephone Encounter (Signed)
Spoke with pt regarding her Pap results: Pap negative  Pt then asked what was her UA results there was no order put in for it. I explained to pt there was no indication on the note stating she had urinary symptoms so we don't do UA's if theres no symptoms.  Pt was very concerned why her Urine was took. As well as stating she has a history of frequent UTI's.  I asked pt if she's still having symptoms she states all the time I gave her the option of coming in and leaving a UA she declined

## 2017-07-15 ENCOUNTER — Ambulatory Visit: Payer: PPO | Admitting: Podiatry

## 2017-07-20 ENCOUNTER — Other Ambulatory Visit: Payer: Self-pay | Admitting: Gynecology

## 2017-07-20 DIAGNOSIS — M810 Age-related osteoporosis without current pathological fracture: Secondary | ICD-10-CM

## 2017-07-28 ENCOUNTER — Inpatient Hospital Stay: Payer: PPO

## 2017-07-29 DIAGNOSIS — R945 Abnormal results of liver function studies: Secondary | ICD-10-CM | POA: Diagnosis not present

## 2017-07-29 DIAGNOSIS — K754 Autoimmune hepatitis: Secondary | ICD-10-CM | POA: Diagnosis not present

## 2017-07-29 DIAGNOSIS — R300 Dysuria: Secondary | ICD-10-CM | POA: Diagnosis not present

## 2017-07-29 DIAGNOSIS — E039 Hypothyroidism, unspecified: Secondary | ICD-10-CM | POA: Diagnosis not present

## 2017-08-03 ENCOUNTER — Ambulatory Visit (INDEPENDENT_AMBULATORY_CARE_PROVIDER_SITE_OTHER): Payer: PPO

## 2017-08-03 DIAGNOSIS — M810 Age-related osteoporosis without current pathological fracture: Secondary | ICD-10-CM | POA: Diagnosis not present

## 2017-08-04 ENCOUNTER — Encounter: Payer: Self-pay | Admitting: Gynecology

## 2017-08-05 ENCOUNTER — Ambulatory Visit (INDEPENDENT_AMBULATORY_CARE_PROVIDER_SITE_OTHER): Payer: PPO

## 2017-08-05 ENCOUNTER — Ambulatory Visit: Payer: PPO | Admitting: Obstetrics & Gynecology

## 2017-08-05 DIAGNOSIS — R102 Pelvic and perineal pain: Secondary | ICD-10-CM | POA: Diagnosis not present

## 2017-08-05 NOTE — Progress Notes (Signed)
    [name] 08-17-1945 [id number]        72 y.o.  [id number] Married  RP:  Intermittent pelvic pain for Pelvic US  HPI:  No change since last visit on 07/07/2017 when we noted:  Menopause on HRT with Dr Demetrio Lapping [facility] med).  She is on estrogen cream 0.125 daily and Prometrium 100 mg at bedtime.  She also takes DHEA and pregnenolone.  No PMB.  Intermittent pelvic pain.  No pain with IC.  Less physically active recently due to Plantar Fasciitis.  Received Reclast x 1 with Dr Marin Olp for Osteoporosis T-Score -2.6.  Decided to stop Reclast.  Will need a repeat Bone Density.  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 Urine/BMs wnl currently.  H/O frequent UTIs.  Breasts wnl.  Labs with Fam MD.    OB History  Gravida Para Term Preterm AB Living  4 3 3   1 3   SAB TAB Ectopic Multiple Live Births  1       3    # Outcome Date GA Lbr Len/2nd Weight Sex Delivery Anes PTL Lv  4 SAB           3 Term     F Vag-Spont  N LIV  2 Term     M Vag-Spont  N LIV  1 Term     F Vag-Spont  N LIV      Past medical history,surgical history, problem list, medications, allergies, family history and social history were all reviewed and documented in the EPIC chart.   Directed ROS with pertinent positives and negatives documented in the history of present illness/assessment and plan.  Exam:  There were no vitals filed for this visit. General appearance:  Normal  Pelvic US today: T/V images.  Status post bilateral salpingo-oophorectomy.  Retroverted uterus with heterogeneous echo pattern.  The uterus measures 6.47 x 4.42 x 2.82 cm.  A small fibroid is present measuring 4 x 6 mm.  The endometrial lining is thin and normal at 2.3 mm.  Right and left adnexa negative.  No free fluid in the posterior cul-de-sac.   Assessment/Plan:  72 y.o. [id number]   1. Pelvic pain in female Mild intermittent pelvic pain with a negative pelvic ultrasound today.  Patient reassured that no pelvic  cyst or mass was seen and no free fluid.  Her uterus showed a tiny fibroid at 6 mm and the endometrial line was thin and normal at 2.3 mm.  Patient is status post bilateral salpingo-oophorectomy.  Given the negative pelvic ultrasound and minimal symptoms, decision to observe and follow-up for an annual gynecologic exam next year.  Counseling on above issue more than 50% for 15 minutes.  Princess Bruins MD, 12:43 PM 08/05/2017

## 2017-08-10 ENCOUNTER — Encounter: Payer: Self-pay | Admitting: Obstetrics & Gynecology

## 2017-08-10 NOTE — Patient Instructions (Signed)
1. Pelvic pain in female Mild intermittent pelvic pain with a negative pelvic ultrasound today.  Patient reassured that no pelvic cyst or mass was seen and no [name] fluid.  Her uterus showed a tiny fibroid at 6 mm and the endometrial line was thin and normal at 2.3 mm.  Patient is status post bilateral salpingo-oophorectomy.  Given the negative pelvic ultrasound and minimal symptoms, decision to observe and follow-up for an annual gynecologic exam next year.  Lori [name], good seeing you today!

## 2017-08-19 ENCOUNTER — Ambulatory Visit (INDEPENDENT_AMBULATORY_CARE_PROVIDER_SITE_OTHER): Payer: PPO | Admitting: Cardiovascular Disease

## 2017-08-19 ENCOUNTER — Encounter: Payer: Self-pay | Admitting: Cardiovascular Disease

## 2017-08-19 VITALS — BP 112/72 | HR 88 | Ht 66.0 in | Wt 157.8 lb

## 2017-08-19 DIAGNOSIS — M351 Other overlap syndromes: Secondary | ICD-10-CM | POA: Diagnosis not present

## 2017-08-19 DIAGNOSIS — E781 Pure hyperglyceridemia: Secondary | ICD-10-CM

## 2017-08-19 DIAGNOSIS — K7689 Other specified diseases of liver: Secondary | ICD-10-CM

## 2017-08-19 DIAGNOSIS — R002 Palpitations: Secondary | ICD-10-CM

## 2017-08-19 NOTE — Progress Notes (Signed)
Patient ID: KINZLEY SAVELL, female   DOB: March 18, 1946, 72 y.o.   MRN: [id number]     HPI: Ms. Keough is a 72 year old female who is a former patient of Dr. Rollene Fare.  I last saw her in February 2018.  She presents for 13 month follow-up evaluation.  Ms. Buttrey has a history of episodic palpitations in the past has been demonstrated to have a PACs with short runs of atrial tachycardia. She  has a history of irritable bowel syndrome, chronic fatigue, hypothyroidism.  She has had issues with bladder infections, and underwent bladder tacking surgery in July. She sees Dr. Christena Deem of urology in [facility].  In 2012 an echo Doppler study showed mild concentric left ventricular hypertrophy with normal systolic and diastolic function. There was trivial mitral and tricuspid regurgitation. She previously has had a normal Myoview scan in 2013 she also had carotid studies which essentially were normal.   She sees Dr. Sid Falcon of [place] frequently and is on numerous vitamin and supplement medications.She does take numerous herbal type medications as well as vitamins.  She states she was told of having possible low adrenal function.  She denies any recent episodes of chest pain. There are no episodes of presyncope or syncope.  Since I last saw her 15 months ago, she has felt well from a cardiovascular standpoint.  She does note an occasional palpitation but her current dose of metoprolol 37.5 mg has controlled this most of the time.  At times she has taken an extra 12.5 mg.  She states she has had autoimmune disease with mixed connective tissue disease, autoimmune hepatitis, rheumatoid arthritis, and lupus., hemachromatosis and undergoes periodic phlebotomy by Dr. Marin Olp.  She states several family members has venous disease.  Time.  She notes occasional cramping of her legs at night.  She denies exertional claudication symptoms.  She denies varicose veins.  She denies any episodes of  chest pain.  She notes mild shortness of breath when she bends over.  There is some very mild shortness of breath with activity, which has not changed significantly.  She has been undergoing blood work by Dr. Sid Falcon dose every 3 months and does plaque X treatments.   Since I last saw her, she has started therapy with protandum which she says is a mitochondrial cellular regeneration product.  She takes this for her immunologic diseases which include lupus, RA, mixedconnective tissue disorder, autoimmune hepatitis, hemachromatosis.  She feels significant improved since taking this medication.  She presents for reevaluation.   Past Medical History:  Diagnosis Date  . Cancer ([place]) 06/2017   SQUAMOS CELL REMOVED FROM FOREHEAD  . Chronic fatigue   . Hormone disorder   . Hypothyroidism   . IBS (irritable bowel syndrome)   . Lupus   . Osteoporosis 07/2017   T score -2.7 stable from prior DEXA  . Palpitations   . Rheumatoid arthritis [facility])     Past Surgical History:  Procedure Laterality Date  . ABDOMINAL SURGERY     ABDOMINOPLASTY  . BLADDER SUSPENSION    . BREAST SURGERY  1980   LUMPECTOMY X 2 FROM RIGHT BREAST  . CARDIOVASCULAR STRESS TEST  08/28/2011   Normal  . CAROTID DOPPLER  08/28/2011   Normal, no evidence of significant diameter reduction, dissection, or vascular abnormality  . COLONOSCOPY W/ POLYPECTOMY  2018  . EYE SURGERY    . LAPAROSCOPIC CHOLECYSTECTOMY    . TONSILLECTOMY AND ADENOIDECTOMY    . TRANSTHORACIC ECHOCARDIOGRAM  01/29/2011   EF >55%, mild concentric LVH  . TUBAL LIGATION      Allergies  Allergen Reactions  . Albumin (Human)     Pt stated, "I am not allergic to this and don't know what this means"; 04/27/17  . Amoxicillin Nausea And Vomiting  . Codeine Nausea Only  . Erythromycin Nausea Only  . Iodinated Diagnostic Agents Other (See Comments)  . Keflex [Cephalexin] Itching  . Meperidine     Pt stated, "I don't know what this is"; 04/27/17  .  Metoclopramide Hcl   . Other Other (See Comments)    Beef Chicken Dairy Eggs  Pt stated, "My body can't process protein; it makes me sick"  . Shellfish Allergy Other (See Comments)    Pt stated, "I am not allergic to this anymore"; 04/27/17  . Shellfish-Derived Products   . Oxycodone Nausea And Vomiting    Pt stated, "I do not know if I am allergic to this"; 04/27/17    Current Outpatient Medications  Medication Sig Dispense Refill  . Ascorbic Acid (VITAMIN C) 1000 MG tablet Take 1,000 mg by mouth daily.     . Biotin 5 MG CAPS Take 5 mg by mouth.    Marland Kitchen BIOTIN PO Take by mouth daily.    Marland Kitchen BORON PO Take by mouth.    . BORON PO Take by mouth daily.    . calcium acetate (PHOSLO) 667 MG capsule Take by mouth 3 (three) times daily with meals.    . Calcium-Magnesium-Vitamin D (CALCIUM MAGNESIUM PO) Take by mouth at bedtime.    . Cholecalciferol (VITAMIN D PO) Take 1 tablet by mouth daily.    . Coenzyme Q10 (CO Q 10 PO) Take 1 tablet by mouth daily.    Marland Kitchen dextroamphetamine (DEXEDRINE SPANSULE) 5 MG 24 hr capsule Take 5 mg by mouth daily.  0  . doxycycline (VIBRA-TABS) 100 MG tablet     . doxycycline (VIBRA-TABS) 100 MG tablet Take 1 tablet (100 mg total) by mouth 2 (two) times daily. 20 tablet 0  . fish oil-omega-3 fatty acids 1000 MG capsule Take 1 g by mouth daily.    Marland Kitchen GLUTAMINE PO Take 1 tablet by mouth 2 (two) times daily.    Marland Kitchen LYSINE PO Take 1 tablet by mouth 2 (two) times daily.     Marland Kitchen MELATONIN PO Take 2 mg by mouth at bedtime.     . Menaquinone-7 (VITAMIN K2 PO) Take by mouth once.    . Methylsulfonylmethane (MSM) POWD Take by mouth.    . metoprolol succinate (TOPROL-XL) 25 MG 24 hr tablet TAKE 1 TABLET IN AM AND 1/2 TABLET IN THE PM. (Patient taking differently: No sig reported) 45 tablet 10  . Multiple Vitamin (MULTIVITAMIN) tablet Take 1 tablet by mouth daily.    . NON FORMULARY Bio-est Cream    . NON FORMULARY Pregnolol cap    . NONFORMULARY OR COMPOUNDED ITEM Boric Acid 600 mg  caps. S: insert vaginally twice daily x 2 weeks then twice weekly to complete 3 mos of treatment. 60 each 1  . potassium chloride SA (K-DUR,KLOR-CON) 20 MEQ tablet Take 1 tablet by mouth daily.    . Prasterone, DHEA, 10 MG CAPS Take 7.5 mg by mouth daily.    . progesterone (PROMETRIUM) 200 MG capsule Take 200 mg by mouth daily.    . Thyroid (NATURE-THROID) 97.5 MG TABS Take 1 tablet by mouth daily.    Marland Kitchen tolterodine (DETROL LA) 4 MG 24 hr capsule Take  4 mg by mouth.     Current Facility-Administered Medications  Medication Dose Route Frequency Provider Last Rate Last Dose  . triamcinolone acetonide (KENALOG) 10 MG/ML injection 10 mg  10 mg Other Once Wallene Huh, DPM        Socially History is notable in that she is married for 43 years. She has 3 children and 2 grandchildren. She completed college. She does drink occasional wine. There is no tobacco history. She does walk occasionally.   Family History  Problem Relation Age of Onset  . Seizures Mother   . Transient ischemic attack Mother   . Cancer Father 45       COLON  . Diabetes Father   . Heart disease Father   . Breast cancer Maternal Aunt   . Heart disease Paternal Uncle   . Breast cancer Maternal Aunt     ROS General: Negative; No fevers, chills, or night sweats; positive for chronic fatigue HEENT: Negative; No changes in vision or hearing, sinus congestion, difficulty swallowing Pulmonary: Negative; No cough, wheezing, shortness of breath, hemoptysis Cardiovascular: Negative; No chest pain, presyncope, syncope, palpitations GI: Nausea for irritable bowel syndrome No nausea, vomiting, diarrhea, or abdominal pain GU: Negative; No dysuria, hematuria, or difficulty voiding Musculoskeletal: Positive for mixed connective tissue disease Rheumatologic: Reported history of lupus and rheumatoid disease. Hematologic/Oncology: Positive for hemachromatosis for which she undergoes phlebotomy Endocrine: Negative; no heat/cold  intolerance; no diabetes Neuro: Negative; no changes in balance, headaches Skin: Negative; No rashes or skin lesions Psychiatric: Negative; No behavioral problems, depression Sleep: Negative; No snoring, daytime sleepiness, hypersomnolence, bruxism, restless legs, hypnogognic hallucinations, no cataplexy Other comprehensive 14 point system review is negative.   PE BP 112/72   Pulse 88   Ht 5' 6"  (1.676 m)   Wt 157 lb 12.8 oz (71.6 kg)   BMI 25.47 kg/m    Repeat blood pressure by me 110/70  Wt Readings from Last 3 Encounters:  08/19/17 157 lb 12.8 oz (71.6 kg)  07/07/17 158 lb (71.7 kg)  06/24/17 161 lb (73 kg)   General: Alert, oriented, no distress.  Skin: normal turgor, no rashes, warm and dry HEENT: Normocephalic, atraumatic. Pupils equal round and reactive to light; sclera anicteric; extraocular muscles intact; Nose without nasal septal hypertrophy Mouth/Parynx benign; Mallinpatti scale 2 Neck: No JVD, no carotid bruits; normal carotid upstroke Lungs: clear to ausculatation and percussion; no wheezing or rales Chest wall: without tenderness to palpitation Heart: PMI not displaced, RRR, s1 s2 normal, 1/6 systolic murmur, no diastolic murmur, no rubs, gallops, thrills, or heaves Abdomen: soft, nontender; no hepatosplenomehaly, BS+; abdominal aorta nontender and not dilated by palpation. Back: no CVA tenderness Pulses 2+ Musculoskeletal: full range of motion, normal strength, no joint deformities Extremities: no clubbing cyanosis or edema, Homan's sign negative  Neurologic: grossly nonfocal; Cranial nerves grossly wnl Psychologic: Normal mood and affect   ECG (independently read by me): Normal sinus rhythm at 88 bpm.  Borderline first-degree AV block with a PR interval of 204 ms.  Normal intervals.  No ST segment changes.  February 2018 ECG (independently read by me): Normal sinus rhythm with PACs and mild sinus arrhythmia.  QTc interval 442 ms.  November 2016 ECG  (independently read by me): ECG reveals sinus rhythm with first-degree AV block with PR interval 216 only seconds. Occasional PACs.  Nonspecific ST changes.  November 2015 ECG (independently read by me): Sinus rhythm with sinus arrhythmia and PACs and transient atrial bigeminal like pattern  Prior  November 2014  ECG: Sinus rhythm with PAC. QTc interval 442 ms. PR interval 200 ms  LABS: BMP Latest Ref Rng & Units 06/24/2017 03/11/2017 07/02/2016  Glucose 70 - 140 mg/dL 94 159(H) 81  BUN 7 - 26 mg/dL 15 15 20.3  Creatinine 0.60 - 1.10 mg/dL 0.72 0.7 0.8  Sodium 136 - 145 mmol/L 140 143 139  Potassium 3.5 - 5.1 mmol/L 3.7 3.9 4.1  Chloride 98 - 109 mmol/L 105 105 -  CO2 22 - 29 mmol/L 26 31 26   Calcium 8.4 - 10.4 mg/dL 9.3 9.4 10.0   Hepatic Function Latest Ref Rng & Units 06/24/2017 03/11/2017 07/02/2016  Total Protein 6.4 - 8.3 g/dL 6.9 6.6 7.5  Albumin 3.5 - 5.0 g/dL 4.0 3.4 4.1  AST 5 - 34 U/L 28 31 27   ALT 0 - 55 U/L 27 32 34  Alk Phosphatase 40 - 150 U/L 69 69 87  Total Bilirubin 0.2 - 1.2 mg/dL 0.4 0.60 0.55  Bilirubin, Direct 0.0 - 0.3 mg/dL - - -   CBC Latest Ref Rng & Units 06/24/2017 03/11/2017 10/09/2016  WBC 3.9 - 10.3 K/uL 5.8 6.9 6.0  Hemoglobin 11.6 - 15.9 g/dL - 14.4 15.7  Hematocrit 34.8 - 46.6 % 43.4 42.3 45.3  Platelets 145 - 400 K/uL 209 201 219   Lab Results  Component Value Date   MCV 95.2 06/24/2017   MCV 96 03/11/2017   MCV 95 10/09/2016   No results found for: TSH   No results found for: HGBA1C   Lipid Panel     Component Value Date/Time   CHOL (H) 08/18/2008 0250    209        ATP III CLASSIFICATION:  <200     mg/dL   Desirable  200-239  mg/dL   Borderline High  >=240    mg/dL   High          TRIG 87 08/18/2008 0250   HDL 58 08/18/2008 0250   CHOLHDL 3.6 08/18/2008 0250   VLDL 17 08/18/2008 0250   LDLCALC (H) 08/18/2008 0250    134        Total Cholesterol/HDL:CHD Risk Coronary Heart Disease Risk Table                     Men   Women  1/2  Average Risk   3.4   3.3  Average Risk       5.0   4.4  2 X Average Risk   9.6   7.1  3 X Average Risk  23.4   11.0        Use the calculated Patient Ratio above and the CHD Risk Table to determine the patient's CHD Risk.        ATP III CLASSIFICATION (LDL):  <100     mg/dL   Optimal  100-129  mg/dL   Near or Above                    Optimal  130-159  mg/dL   Borderline  160-189  mg/dL   High  >190     mg/dL   Very High     RADIOLOGY: Dg Foot Complete Left  04/17/2013   Multiple view x-rays of left foot indicate no signs of stress fracture  with what appears to be arthritis at the fifth MPJ with narrowing of the  joint surface noted x-ray  Dg Foot Complete Right  04/17/2013   Multiple view  x-rays of right foot indicate good alignment with no signs  of stress fracture or arthritis noted  IMPRESSION:  1. Palpitation   2. Mixed connective tissue disease ([place])   3. Hemochromatosis, unspecified hemochromatosis type   4. Pure hyperglyceridemia   5. Autoimmune disease of liver     ASSESSMENT AND PLAN: Ms. Wisdom Seybold is a 72 year old female who is on multiple vitamins and additional supplemental medications prescribed by Dr. Gerarda Gunther at [place].  She has a history of palpitations and has been taking Toprol-XL.  When I last saw her, I titrated this to 37.5 mg daily and she believes this has made a difference in her awareness of palpitations.  .  Since I last saw her one year ago, she has self reduced the dose back to 25 mg.  She believes the palpitations are stable.  She was concerned that she noted more weight gain on the higher dose.  She denies any episodes of chest pain.  Her ECG today shows sinus rhythm with borderline first-degree block without ectopy.she has significant autoimmune disorders which she feels have been remarkably improved by a medication which she pays out of pocket called protandum a mitochondrial cellular regeneration product.  I reviewed  laboratory which showed an increased cholesterol at 221, LDL cholesterol is elevated at 138. She cannot take statin therapy due to her autoimmune disease of the liver.  I discussed possible alternatives Her recent ferritin was 47.  She states that initially she used to get phlebotomy to keep refer to level under 50, but now she often gets phlebotomy to keep her level less than 30.  She continues to take IV plaque X every 2 weeks prescribed by Dr. Sid Falcon. Cardiac wise she is stable.  I will see her in one year for reevaluation.  Time spent: 25 minutes Troy Sine, MD, [facility] 08/20/2017 7:24 PM

## 2017-08-19 NOTE — Patient Instructions (Signed)

## 2017-08-20 ENCOUNTER — Encounter: Payer: Self-pay | Admitting: Cardiovascular Disease

## 2017-08-20 DIAGNOSIS — N390 Urinary tract infection, site not specified: Secondary | ICD-10-CM | POA: Diagnosis not present

## 2017-08-20 DIAGNOSIS — R300 Dysuria: Secondary | ICD-10-CM | POA: Diagnosis not present

## 2017-08-26 ENCOUNTER — Other Ambulatory Visit: Payer: Self-pay | Admitting: *Deleted

## 2017-08-26 DIAGNOSIS — J189 Pneumonia, unspecified organism: Secondary | ICD-10-CM

## 2017-08-26 DIAGNOSIS — R7989 Other specified abnormal findings of blood chemistry: Secondary | ICD-10-CM

## 2017-08-27 ENCOUNTER — Inpatient Hospital Stay: Payer: PPO

## 2017-08-27 ENCOUNTER — Inpatient Hospital Stay: Payer: PPO | Attending: Hematology & Oncology

## 2017-08-27 DIAGNOSIS — J189 Pneumonia, unspecified organism: Secondary | ICD-10-CM

## 2017-08-27 LAB — CMP (CANCER CENTER ONLY)
ALBUMIN: 3.8 g/dL (ref 3.5–5.0)
ALK PHOS: 73 U/L (ref 40–150)
ALT: 36 U/L (ref 0–55)
AST: 36 U/L — ABNORMAL HIGH (ref 5–34)
Anion gap: 8 (ref 3–11)
BILIRUBIN TOTAL: 0.5 mg/dL (ref 0.2–1.2)
BUN: 16 mg/dL (ref 7–26)
CALCIUM: 9.9 mg/dL (ref 8.4–10.4)
CO2: 28 mmol/L (ref 22–29)
Chloride: 105 mmol/L (ref 98–109)
Creatinine: 0.91 mg/dL (ref 0.60–1.10)
GFR, Est AFR Am: >60 mL/min (ref >60)
GLUCOSE: 110 mg/dL (ref 70–140)
Potassium: 4.4 mmol/L (ref 3.5–5.1)
Sodium: 141 mmol/L (ref 136–145)
TOTAL PROTEIN: 7 g/dL (ref 6.4–8.3)

## 2017-08-27 LAB — CBC WITH DIFFERENTIAL (CANCER CENTER ONLY)
BASOS PCT: 0 %
Basophils Absolute: 0 10*3/uL (ref 0.0–0.1)
Eosinophils Absolute: 0.1 10*3/uL (ref 0.0–0.5)
Eosinophils Relative: 1 %
HEMATOCRIT: 43.9 % (ref 34.8–46.6)
HEMOGLOBIN: 15.1 g/dL (ref 11.6–15.9)
LYMPHS ABS: 2.8 10*3/uL (ref 0.9–3.3)
LYMPHS PCT: 36 %
MCH: 32.7 pg (ref 26.0–34.0)
MCHC: 34.4 g/dL (ref 32.0–36.0)
MCV: 95 fL (ref 81.0–101.0)
MONOS PCT: 10 %
Monocytes Absolute: 0.7 10*3/uL (ref 0.1–0.9)
NEUTROS ABS: 4.1 10*3/uL (ref 1.5–6.5)
NEUTROS PCT: 53 %
Platelet Count: 124 10*3/uL — ABNORMAL LOW (ref 145–400)
RBC: 4.62 MIL/uL (ref 3.70–5.32)
RDW: 12.3 % (ref 11.1–15.7)
WBC Count: 7.7 10*3/uL (ref 3.9–10.0)

## 2017-08-27 LAB — IRON AND TIBC
Iron: 182 ug/dL — ABNORMAL HIGH (ref 41–142)
Saturation Ratios: 77 % — ABNORMAL HIGH (ref 21–57)
TIBC: 236 ug/dL (ref 236–444)
UIBC: 54 ug/dL

## 2017-08-27 LAB — FERRITIN: FERRITIN: 35 ng/mL (ref 9–269)

## 2017-08-27 NOTE — Progress Notes (Signed)
Lori [name] presents today for phlebotomy per MD orders. Phlebotomy procedure started at 1215 and ended at 1230 via 18 g angio cath to right posterior forearm. 545 grams removed.  Pressure dressing applied to phlebotomy site.  Patient observed for 15 minutes after procedure without any incident.  Pt refused to stay for full 30 minute observation.  Patient tolerated procedure well.  Pt refused snack, but did take beverage.  IV needle removed intact.  VS stable and pressure dressing clean, dry and intact to phlebotomy site at time of discharge.

## 2017-09-08 ENCOUNTER — Other Ambulatory Visit: Payer: Self-pay | Admitting: Cardiovascular Disease

## 2017-09-15 DIAGNOSIS — H43812 Vitreous degeneration, left eye: Secondary | ICD-10-CM | POA: Diagnosis not present

## 2017-09-15 DIAGNOSIS — H16223 Keratoconjunctivitis sicca, not specified as Sjogren's, bilateral: Secondary | ICD-10-CM | POA: Diagnosis not present

## 2017-09-15 DIAGNOSIS — H52223 Regular astigmatism, bilateral: Secondary | ICD-10-CM | POA: Diagnosis not present

## 2017-09-15 DIAGNOSIS — H40013 Open angle with borderline findings, low risk, bilateral: Secondary | ICD-10-CM | POA: Diagnosis not present

## 2017-09-15 DIAGNOSIS — H524 Presbyopia: Secondary | ICD-10-CM | POA: Diagnosis not present

## 2017-09-15 DIAGNOSIS — H2513 Age-related nuclear cataract, bilateral: Secondary | ICD-10-CM | POA: Diagnosis not present

## 2017-09-15 DIAGNOSIS — H5203 Hypermetropia, bilateral: Secondary | ICD-10-CM | POA: Diagnosis not present

## 2017-09-16 ENCOUNTER — Encounter (INDEPENDENT_AMBULATORY_CARE_PROVIDER_SITE_OTHER): Payer: Self-pay | Admitting: Ophthalmology

## 2017-09-16 ENCOUNTER — Ambulatory Visit (INDEPENDENT_AMBULATORY_CARE_PROVIDER_SITE_OTHER): Payer: PPO | Admitting: Ophthalmology

## 2017-09-16 DIAGNOSIS — H3581 Retinal edema: Secondary | ICD-10-CM

## 2017-09-16 DIAGNOSIS — H33102 Unspecified retinoschisis, left eye: Secondary | ICD-10-CM

## 2017-09-16 DIAGNOSIS — H43812 Vitreous degeneration, left eye: Secondary | ICD-10-CM

## 2017-09-16 DIAGNOSIS — H25813 Combined forms of age-related cataract, bilateral: Secondary | ICD-10-CM | POA: Diagnosis not present
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
## 2017-09-17 ENCOUNTER — Encounter (INDEPENDENT_AMBULATORY_CARE_PROVIDER_SITE_OTHER): Payer: Self-pay | Admitting: Ophthalmology

## 2017-09-23 ENCOUNTER — Ambulatory Visit: Payer: PPO | Admitting: Family

## 2017-09-23 ENCOUNTER — Other Ambulatory Visit: Payer: PPO

## 2017-10-06 ENCOUNTER — Encounter (INDEPENDENT_AMBULATORY_CARE_PROVIDER_SITE_OTHER): Payer: Self-pay | Admitting: Ophthalmology

## 2017-10-06 ENCOUNTER — Encounter (INDEPENDENT_AMBULATORY_CARE_PROVIDER_SITE_OTHER): Payer: PPO | Admitting: Ophthalmology

## 2017-10-06 ENCOUNTER — Ambulatory Visit (INDEPENDENT_AMBULATORY_CARE_PROVIDER_SITE_OTHER): Payer: PPO | Admitting: Ophthalmology

## 2017-10-06 DIAGNOSIS — H3581 Retinal edema: Secondary | ICD-10-CM

## 2017-10-06 DIAGNOSIS — H43812 Vitreous degeneration, left eye: Secondary | ICD-10-CM

## 2017-10-06 DIAGNOSIS — H33102 Unspecified retinoschisis, left eye: Secondary | ICD-10-CM | POA: Diagnosis not present

## 2017-10-06 DIAGNOSIS — H25813 Combined forms of age-related cataract, bilateral: Secondary | ICD-10-CM | POA: Diagnosis not present

## 2017-10-06 NOTE — Progress Notes (Signed)
[facility] Note  10/06/2017     CHIEF COMPLAINT Patient presents for Retina Follow Up   HISTORY OF PRESENT ILLNESS: Lori [name] is a 72 y.o. female who presents to the clinic today for:   HPI    Retina Follow Up    Patient presents with  PVD.  In left eye.  Severity is moderate.  Duration of 3 weeks.  Since onset it is stable.  I, the attending physician,  performed the HPI with the patient and updated documentation appropriately.          Comments    Pt presents for PVD F/U, pt states she feels VA is getting progessively worse, states she cannot see to read somehting on her phone and the TV is blurry, pt states she is no longer seeing flashes OS, but states she is still seeing the same floaters, pt denies pain or wavy vision, pt uses OTC gtts for dryness and allergies PRN,        Last edited by Bernarda Caffey, MD on 10/06/2017  3:08 PM. (History)    Pt states she continues to have floaters, states flashes have stopped;    Referring physician: Marton Redwood, MD [address] [place], [place] [address]  HISTORICAL INFORMATION:   Selected notes from the MEDICAL RECORD NUMBER Referred by Dr. Calvert Cantor for concern of PVD OD LEE:  Ocular Hx- PMH-    CURRENT MEDICATIONS: No current outpatient medications on file. (Ophthalmic Drugs)   No current facility-administered medications for this visit.  (Ophthalmic Drugs)   Current Outpatient Medications (Other)  Medication Sig  . fluconazole (DIFLUCAN) 200 MG tablet Take by mouth.  . Ascorbic Acid (VITAMIN C) 1000 MG tablet Take 1,000 mg by mouth daily.   . Biotin 5 MG CAPS Take 5 mg by mouth.  . Biotin 5 MG CAPS Take by mouth.  Marland Kitchen BIOTIN PO Take by mouth daily.  . Boron Amorphous Fine POWD Take by mouth.  . BORON PO Take by mouth.  . BORON PO Take by mouth daily.  . calcium acetate (PHOSLO) 667 MG capsule Take by mouth 3 (three) times daily with meals.  . Calcium-Magnesium-Vitamin D (CALCIUM  MAGNESIUM PO) Take by mouth at bedtime.  . Cholecalciferol (VITAMIN D PO) Take 1 tablet by mouth daily.  . Coenzyme Q10 (CO Q 10 PO) Take 1 tablet by mouth daily.  . Coenzyme Q10 (CO Q-10) 100 MG CAPS Take by mouth.  . dextroamphetamine (DEXEDRINE SPANSULE) 5 MG 24 hr capsule Take 5 mg by mouth daily.  Marland Kitchen doxycycline (VIBRA-TABS) 100 MG tablet   . doxycycline (VIBRA-TABS) 100 MG tablet Take 1 tablet (100 mg total) by mouth 2 (two) times daily. (Patient not taking: Reported on 10/06/2017)  . fish oil-omega-3 fatty acids 1000 MG capsule Take 1 g by mouth daily.  Marland Kitchen GLUTAMINE PO Take 1 tablet by mouth 2 (two) times daily.  Marland Kitchen LYSINE PO Take 1 tablet by mouth 2 (two) times daily.   Marland Kitchen MELATONIN PO Take 2 mg by mouth at bedtime.   . Menaquinone-7 (VITAMIN K2 PO) Take by mouth once.  . Methylsulfonylmethane (MSM) POWD Take by mouth.  . metoprolol succinate (TOPROL-XL) 25 MG 24 hr tablet TAKE 1 TABLET IN AM AND 1/2 TABLET IN THE PM.  . Multiple Vitamin (MULTIVITAMIN) tablet Take 1 tablet by mouth daily.  . NON FORMULARY Bio-est Cream  . NON FORMULARY Pregnolol cap  . NONFORMULARY OR COMPOUNDED ITEM Boric Acid 600  mg caps. S: insert vaginally twice daily x 2 weeks then twice weekly to complete 3 mos of treatment.  . potassium chloride SA (K-DUR,KLOR-CON) 20 MEQ tablet Take 1 tablet by mouth daily.  . Prasterone, DHEA, 10 MG CAPS Take 7.5 mg by mouth daily.  . progesterone (PROMETRIUM) 200 MG capsule Take 200 mg by mouth daily.  . Thyroid (NATURE-THROID) 97.5 MG TABS Take 1 tablet by mouth daily.  Marland Kitchen tolterodine (DETROL LA) 4 MG 24 hr capsule Take 4 mg by mouth.   Current Facility-Administered Medications (Other)  Medication Route  . triamcinolone acetonide (KENALOG) 10 MG/ML injection 10 mg Other      REVIEW OF SYSTEMS: ROS    Positive for: Eyes   Negative for: Constitutional, Gastrointestinal, Neurological, Skin, Genitourinary, Musculoskeletal, HENT, Endocrine, Cardiovascular, Respiratory,  Psychiatric, Allergic/Imm, Heme/Lymph   Last edited by Debbrah Alar, COT on 10/06/2017  2:20 PM. (History)       ALLERGIES Allergies  Allergen Reactions  . Albumin (Human)     Pt stated, "I am not allergic to this and don't know what this means"; 04/27/17  . Amoxicillin Nausea And Vomiting  . Codeine Nausea Only  . Erythromycin Nausea Only  . Iodinated Diagnostic Agents Other (See Comments)  . Keflex [Cephalexin] Itching  . Meperidine     Pt stated, "I don't know what this is"; 04/27/17  . Metoclopramide Hcl   . Other Other (See Comments)    Beef Chicken Dairy Eggs  Pt stated, "My body can't process protein; it makes me sick"  . Shellfish Allergy Other (See Comments)    Pt stated, "I am not allergic to this anymore"; 04/27/17  . Shellfish-Derived Products   . Oxycodone Nausea And Vomiting    Pt stated, "I do not know if I am allergic to this"; 04/27/17    PAST MEDICAL HISTORY Past Medical History:  Diagnosis Date  . Cancer ([place]) 06/2017   SQUAMOS CELL REMOVED FROM FOREHEAD  . Chronic fatigue   . Hormone disorder   . Hypothyroidism   . IBS (irritable bowel syndrome)   . Lupus ([place])   . Osteoporosis 07/2017   T score -2.7 stable from prior DEXA  . Palpitations   . Rheumatoid arthritis [facility])    Past Surgical History:  Procedure Laterality Date  . ABDOMINAL SURGERY     ABDOMINOPLASTY  . BLADDER SUSPENSION    . BREAST SURGERY  1980   LUMPECTOMY X 2 FROM RIGHT BREAST  . CARDIOVASCULAR STRESS TEST  08/28/2011   Normal  . CAROTID DOPPLER  08/28/2011   Normal, no evidence of significant diameter reduction, dissection, or vascular abnormality  . COLONOSCOPY W/ POLYPECTOMY  2018  . EYE SURGERY    . LAPAROSCOPIC CHOLECYSTECTOMY    . TONSILLECTOMY AND ADENOIDECTOMY    . TRANSTHORACIC ECHOCARDIOGRAM  01/29/2011   EF >55%, mild concentric LVH  . TUBAL LIGATION      FAMILY HISTORY Family History  Problem Relation Age of Onset  . Seizures Mother   . Transient ischemic  attack Mother   . Glaucoma Mother   . Cancer Father 4       COLON  . Diabetes Father   . Heart disease Father   . Breast cancer Maternal Aunt   . Heart disease Paternal Uncle   . Breast cancer Maternal Aunt   . Glaucoma Maternal Grandmother     SOCIAL HISTORY Social History   Tobacco Use  . Smoking status: Never Smoker  . Smokeless tobacco: Never  Used  Substance Use Topics  . Alcohol use: Yes    Alcohol/week: 0.0 oz    Comment: RARE  . Drug use: No         OPHTHALMIC EXAM:  Base Eye Exam    Visual Acuity (Snellen - Linear)      Right Left   Dist cc 20/20 -2 20/20 -1   Dist ph cc NI NI   Correction:  Glasses       Tonometry (Tonopen, 2:27 PM)      Right Left   Pressure 18 18       Pupils      Dark Light Shape React APD   Right 2 1.5 Round Brisk None   Left 2 1.5 Round Brisk None       Visual Fields (Counting fingers)      Left Right    Full Full       Extraocular Movement      Right Left    Full, Ortho Full, Ortho       Neuro/Psych    Oriented x3:  Yes   Mood/Affect:  Normal       Dilation    Both eyes:  1.0% Mydriacyl, 2.5% Phenylephrine @ 2:27 PM        Slit Lamp and Fundus Exam    Slit Lamp Exam      Right Left   Lids/Lashes Dermatochalasis - upper lid Dermatochalasis - upper lid   Conjunctiva/Sclera White and quiet White and quiet   Cornea Arcus, endothelal changes inferiorly, 2+ diffuse central Punctate epithelial erosions Arcus, 2+ scattered Punctate epithelial erosions   Anterior Chamber Deep and quiet, no cell or flare Deep and quiet, no cell or flare   Iris Round and dilated Round and dilated   Lens 2+ Nuclear sclerosis, 2+ Cortical cataract 2+ Nuclear sclerosis, 2+ Cortical cataract   Vitreous Vitreous syneresis Vitreous syneresis, Posterior vitreous detachment, vitreous condensation lineral circumferential at 0430       Fundus Exam      Right Left   Disc Pink and Sharp Pink and Sharp   C/D Ratio 0.3 0.3   Macula Flat,  mild Retinal pigment epithelial mottling, No heme or edema Good foveal reflex, Flat, mild Retinal pigment epithelial mottling, No heme or edema   Vessels Normal Normal   Periphery Attached Attached, inf temporal retinoschisis from 0500 to 0600, Lattice degeneration from 0430 to 0530          IMAGING AND PROCEDURES  Imaging and Procedures for 09/17/17  OCT, Retina - OU - Both Eyes       Right Eye Quality was good. Central Foveal Thickness: 279. Progression has been stable. Findings include normal foveal contour, no IRF, no SRF.   Left Eye Quality was good. Central Foveal Thickness: 280. Progression has been stable. Findings include normal foveal contour, no IRF, no SRF.   Notes *Images captured and stored on drive  Diagnosis / Impression:  NFP, No IRF/SRF OU  Clinical management:  See below  Abbreviations: NFP - Normal foveal profile. CME - cystoid macular edema. PED - pigment epithelial detachment. IRF - intraretinal fluid. SRF - subretinal fluid. EZ - ellipsoid zone. ERM - epiretinal membrane. ORA - outer retinal atrophy. ORT - outer retinal tubulation. SRHM - subretinal hyper-reflective material                  ASSESSMENT/PLAN:    ICD-10-CM   1. Posterior vitreous detachment of left eye H43.812   2.  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 Retinal edema H35.81 OCT, Retina - OU - Both Eyes  4. Combined form of age-related cataract, both eyes H25.813     1. PVD / vitreous syneresis OS  Discussed findings and prognosis  No RT or RD on repeat scleral depressed exam  Reviewed s/s of RT/RD  Strict return precautions for any such RT/RD signs/symptoms  F/u 4-6 months, sooner PRN  2. Retinoschisis OS - inferotemporal retinoschisis cavity -- stable from prior - no RT/RD - discussed findings, prognosis - monitor for now - F/U 4-6 months  3. No retinal edema on exam or OCT  4. Combined form age-related cataract OU-  - The symptoms of cataract, surgical options, and  treatments and risks were discussed with patient. - discussed diagnosis and progression - not yet visually significant - monitor for now    Ophthalmic Meds Ordered this visit:  No orders of the defined types were placed in this encounter.      Return in about 6 months (around 04/08/2018) for F/U retinoschisis OS, DFE, OCT.  There are no Patient Instructions on file for this visit.   Explained the diagnoses, plan, and follow up with the patient and they expressed understanding.  Patient expressed understanding of the importance of proper follow up care.   This document serves as a record of services personally performed by Gardiner Sleeper, MD, PhD. It was created on their behalf by Catha Brow, [place], a certified ophthalmic assistant. The creation of this record is the provider's dictation and/or activities during the visit.  Electronically signed by: Catha Brow, [place]  05.14.19 10:37 PM     Gardiner Sleeper, M.D., Ph.D. Diseases & Surgery of the Retina and [place] 10/06/17  I have reviewed the above documentation for accuracy and completeness, and I agree with the above. Gardiner Sleeper, M.D., Ph.D. 10/06/17 10:38 PM     Abbreviations: M myopia (nearsighted); A astigmatism; H hyperopia (farsighted); P presbyopia; Mrx spectacle prescription;  CTL contact lenses; OD right eye; OS left eye; OU both eyes  XT exotropia; ET esotropia; PEK punctate epithelial keratitis; PEE punctate epithelial erosions; DES dry eye syndrome; MGD meibomian gland dysfunction; ATs artificial tears; PFAT's preservative free artificial tears; [place] nuclear sclerotic cataract; PSC posterior subcapsular cataract; ERM epi-retinal membrane; PVD posterior vitreous detachment; RD retinal detachment; DM diabetes mellitus; DR diabetic retinopathy; NPDR non-proliferative diabetic retinopathy; PDR proliferative diabetic retinopathy; CSME clinically significant macular edema; DME  diabetic macular edema; dbh dot blot hemorrhages; CWS cotton wool spot; POAG primary open angle glaucoma; C/D cup-to-disc ratio; HVF humphrey visual field; GVF goldmann visual field; OCT optical coherence tomography; IOP intraocular pressure; BRVO Branch retinal vein occlusion; CRVO central retinal vein occlusion; CRAO central retinal artery occlusion; BRAO branch retinal artery occlusion; RT retinal tear; SB scleral buckle; PPV pars plana vitrectomy; VH Vitreous hemorrhage; PRP panretinal laser photocoagulation; IVK intravitreal kenalog; VMT vitreomacular traction; MH Macular hole;  NVD neovascularization of the disc; NVE neovascularization elsewhere; AREDS age related eye disease study; ARMD age related macular degeneration; POAG primary open angle glaucoma; EBMD epithelial/anterior basement membrane dystrophy; ACIOL anterior chamber intraocular lens; IOL intraocular lens; PCIOL posterior chamber intraocular lens; Phaco/IOL phacoemulsification with intraocular lens placement; [place] photorefractive keratectomy; LASIK laser assisted in situ keratomileusis; HTN hypertension; DM diabetes mellitus; COPD chronic obstructive pulmonary disease

## 2017-10-07 ENCOUNTER — Ambulatory Visit: Payer: PPO | Admitting: Family

## 2017-10-07 ENCOUNTER — Other Ambulatory Visit: Payer: PPO

## 2017-10-08 NOTE — Progress Notes (Signed)
This encounter was created in error - please disregard.

## 2017-10-13 ENCOUNTER — Other Ambulatory Visit: Payer: Self-pay

## 2017-10-13 ENCOUNTER — Inpatient Hospital Stay: Payer: PPO | Attending: Hematology & Oncology

## 2017-10-13 ENCOUNTER — Inpatient Hospital Stay (HOSPITAL_BASED_OUTPATIENT_CLINIC_OR_DEPARTMENT_OTHER): Payer: PPO | Admitting: Family

## 2017-10-13 ENCOUNTER — Encounter: Payer: Self-pay | Admitting: Family

## 2017-10-13 ENCOUNTER — Inpatient Hospital Stay: Payer: PPO

## 2017-10-13 DIAGNOSIS — R200 Anesthesia of skin: Secondary | ICD-10-CM | POA: Diagnosis not present

## 2017-10-13 DIAGNOSIS — R201 Hypoesthesia of skin: Secondary | ICD-10-CM | POA: Diagnosis not present

## 2017-10-13 DIAGNOSIS — R42 Dizziness and giddiness: Secondary | ICD-10-CM | POA: Diagnosis not present

## 2017-10-13 DIAGNOSIS — R05 Cough: Secondary | ICD-10-CM | POA: Insufficient documentation

## 2017-10-13 DIAGNOSIS — Z79899 Other long term (current) drug therapy: Secondary | ICD-10-CM | POA: Insufficient documentation

## 2017-10-13 DIAGNOSIS — R5383 Other fatigue: Secondary | ICD-10-CM

## 2017-10-13 DIAGNOSIS — R0609 Other forms of dyspnea: Secondary | ICD-10-CM

## 2017-10-13 DIAGNOSIS — R7989 Other specified abnormal findings of blood chemistry: Secondary | ICD-10-CM

## 2017-10-13 DIAGNOSIS — R51 Headache: Secondary | ICD-10-CM | POA: Diagnosis not present

## 2017-10-13 DIAGNOSIS — H538 Other visual disturbances: Secondary | ICD-10-CM | POA: Insufficient documentation

## 2017-10-13 LAB — CMP (CANCER CENTER ONLY)
ALBUMIN: 4 g/dL (ref 3.5–5.0)
ALK PHOS: 78 U/L (ref 40–150)
ALT: 24 U/L (ref 0–55)
ANION GAP: 10 (ref 3–11)
AST: 29 U/L (ref 5–34)
BILIRUBIN TOTAL: 0.3 mg/dL (ref 0.2–1.2)
BUN: 21 mg/dL (ref 7–26)
CALCIUM: 10.1 mg/dL (ref 8.4–10.4)
CO2: 23 mmol/L (ref 22–29)
Chloride: 106 mmol/L (ref 98–109)
Creatinine: 0.85 mg/dL (ref 0.60–1.10)
GFR, Est AFR Am: >60 mL/min (ref >60)
GFR, Estimated: >60 mL/min (ref >60)
GLUCOSE: 100 mg/dL (ref 70–140)
Potassium: 4.6 mmol/L (ref 3.5–5.1)
SODIUM: 139 mmol/L (ref 136–145)
TOTAL PROTEIN: 7.4 g/dL (ref 6.4–8.3)

## 2017-10-13 LAB — CBC WITH DIFFERENTIAL (CANCER CENTER ONLY)
BASOS ABS: 0 10*3/uL (ref 0.0–0.1)
Basophils Relative: 0 %
EOS ABS: 0.1 10*3/uL (ref 0.0–0.5)
Eosinophils Relative: 1 %
HCT: 43.5 % (ref 34.8–46.6)
Hemoglobin: 15.1 g/dL (ref 11.6–15.9)
LYMPHS ABS: 2.8 10*3/uL (ref 0.9–3.3)
LYMPHS PCT: 38 %
MCH: 32.3 pg (ref 26.0–34.0)
MCHC: 34.7 g/dL (ref 32.0–36.0)
MCV: 93.1 fL (ref 81.0–101.0)
MONO ABS: 0.8 10*3/uL (ref 0.1–0.9)
Monocytes Relative: 10 %
Neutro Abs: 3.7 10*3/uL (ref 1.5–6.5)
Neutrophils Relative %: 51 %
Platelet Count: 230 10*3/uL (ref 145–400)
RBC: 4.67 MIL/uL (ref 3.70–5.32)
RDW: 12.5 % (ref 11.1–15.7)
WBC: 7.3 10*3/uL (ref 3.9–10.0)

## 2017-10-13 NOTE — Progress Notes (Signed)
Hematology and Oncology Follow Up Visit  Lori [name] [id number] March 27, 1946 72 y.o. 10/13/2017   Principle Diagnosis:  Hemochromatosis - heterozygous for the C282Y mutation  Current Therapy:   Phlebotomy as indicated to keep ferritin <35 (changed per her request 06/24/17)   Interim History:  Lori [name] is here today for follow-up and phlebotomy. She is symptomatic with extreme fatigue, constant joint aches and "catching", headaches, dizziness, blurred vision, palpitations and SOB with any exertion.  Her ferritin last month was 35 and iron saturation 77%.  No fever, chills, n/v, rash or chest pain. No lymphadenopathy noted on exam.  She has a dry cough and will have some sinus secretions. She takes tessalon perles as needed.  The intermittent numbness and tingling in her hands and feet is unchanged. No swelling present in her extremities at this time.  She eats healthy and takes her supplements regularly. She is staying well hydrated. Her weight is stable.  No episodes of bleeding, no bruising or petechiae.   ECOG Performance Status: 1 - Symptomatic but completely ambulatory  Medications:  Allergies as of 10/13/2017      Reactions   Albumin (human)    Pt stated, "I am not allergic to this and don't know what this means"; 04/27/17   Amoxicillin Nausea And Vomiting   Codeine Nausea Only   Erythromycin Nausea Only   Iodinated Diagnostic Agents Other (See Comments)   Keflex [cephalexin] Itching   Meperidine    Pt stated, "I don't know what this is"; 04/27/17   Metoclopramide Hcl    Other Other (See Comments)   Beef Chicken Dairy Eggs Pt stated, "My body can't process protein; it makes me sick"   Shellfish Allergy Other (See Comments)   Pt stated, "I am not allergic to this anymore"; 04/27/17   Shellfish-derived Products    Oxycodone Nausea And Vomiting   Pt stated, "I do not know if I am allergic to this"; 04/27/17      Medication List        Accurate as of 10/13/17  2:15  PM. Always use your most recent med list.          Biotin 5 MG Caps Take 5 mg by mouth.   BORON PO Take by mouth daily.   calcium acetate 667 MG capsule Commonly known as:  PHOSLO Take by mouth 3 (three) times daily with meals.   CALCIUM MAGNESIUM PO Take by mouth at bedtime.   Co Q-10 100 MG Caps Take by mouth.   dextroamphetamine 5 MG 24 hr capsule Commonly known as:  DEXEDRINE SPANSULE Take 5 mg by mouth daily.   doxycycline 100 MG tablet Commonly known as:  VIBRA-TABS Take 1 tablet (100 mg total) by mouth 2 (two) times daily.   fish oil-omega-3 fatty acids 1000 MG capsule Take 1 g by mouth daily.   fluconazole 200 MG tablet Commonly known as:  DIFLUCAN Take by mouth.   GLUTAMINE PO Take 1 tablet by mouth 2 (two) times daily.   LYSINE PO Take 1 tablet by mouth 2 (two) times daily.   MELATONIN PO Take 2 mg by mouth at bedtime.   metoprolol succinate 25 MG 24 hr tablet Commonly known as:  TOPROL-XL TAKE 1 TABLET IN AM AND 1/2 TABLET IN THE PM.   MSM Powd Take by mouth.   multivitamin tablet Take 1 tablet by mouth daily.   NATURE-THROID 97.5 MG Tabs Generic drug:  Thyroid Take 1 tablet by mouth daily.   NON  FORMULARY Bio-est Cream   NON FORMULARY Pregnolol cap   NONFORMULARY OR COMPOUNDED ITEM Boric Acid 600 mg caps. S: insert vaginally twice daily x 2 weeks then twice weekly to complete 3 mos of treatment.   potassium chloride SA 20 MEQ tablet Commonly known as:  K-DUR,KLOR-CON Take 1 tablet by mouth daily.   Prasterone (DHEA) 10 MG Caps Take 7.5 mg by mouth daily.   progesterone 200 MG capsule Commonly known as:  PROMETRIUM Take 200 mg by mouth daily.   tolterodine 4 MG 24 hr capsule Commonly known as:  DETROL LA Take 4 mg by mouth.   vitamin C 1000 MG tablet Take 1,000 mg by mouth daily.   VITAMIN D PO Take 1 tablet by mouth daily.   VITAMIN K2 PO Take by mouth once.       Allergies:  Allergies  Allergen Reactions  .  Albumin (Human)     Pt stated, "I am not allergic to this and don't know what this means"; 04/27/17  . Amoxicillin Nausea And Vomiting  . Codeine Nausea Only  . Erythromycin Nausea Only  . Iodinated Diagnostic Agents Other (See Comments)  . Keflex [Cephalexin] Itching  . Meperidine     Pt stated, "I don't know what this is"; 04/27/17  . Metoclopramide Hcl   . Other Other (See Comments)    Beef Chicken Dairy Eggs  Pt stated, "My body can't process protein; it makes me sick"  . Shellfish Allergy Other (See Comments)    Pt stated, "I am not allergic to this anymore"; 04/27/17  . Shellfish-Derived Products   . Oxycodone Nausea And Vomiting    Pt stated, "I do not know if I am allergic to this"; 04/27/17    Past Medical History, Surgical history, Social history, and Family History were reviewed and updated.  Review of Systems: All other 10 point review of systems is negative.   Physical Exam:  weight is 156 lb (70.8 kg). Her oral temperature is 98.1 F (36.7 C). Her blood pressure is 117/58 (abnormal) and her pulse is 72. Her oxygen saturation is 100%.   Wt Readings from Last 3 Encounters:  10/13/17 156 lb (70.8 kg)  08/19/17 157 lb 12.8 oz (71.6 kg)  07/07/17 158 lb (71.7 kg)    Ocular: Sclerae unicteric, pupils equal, round and reactive to light Ear-nose-throat: Oropharynx clear, dentition fair Lymphatic: No cervical, supraclavicular or axillary adenopathy Lungs no rales or rhonchi, good excursion bilaterally Heart regular rate and rhythm, no murmur appreciated Abd soft, nontender, positive bowel sounds, no liver or spleen tip palpated on exam, no fluid wave  MSK no focal spinal tenderness, no joint edema Neuro: non-focal, well-oriented, appropriate affect Breasts: Deferred   Lab Results  Component Value Date   WBC 7.3 10/13/2017   HGB 15.1 10/13/2017   HCT 43.5 10/13/2017   MCV 93.1 10/13/2017   PLT 230 10/13/2017   Lab Results  Component Value Date   FERRITIN 35  08/27/2017   IRON 182 (H) 08/27/2017   TIBC 236 08/27/2017   UIBC 54 08/27/2017   IRONPCTSAT 77 (H) 08/27/2017   Lab Results  Component Value Date   RBC 4.67 10/13/2017   No results found for: KPAFRELGTCHN, LAMBDASER, KAPLAMBRATIO No results found for: IGGSERUM, IGA, IGMSERUM No results found for: TOTALPROTELP, ALBUMINELP, A1GS, A2GS, BETS, BETA2SER, GAMS, MSPIKE, SPEI   Chemistry      Component Value Date/Time   NA 141 08/27/2017 1140   NA 143 03/11/2017 1404   NA  139 07/02/2016 1139   K 4.4 08/27/2017 1140   K 3.9 03/11/2017 1404   K 4.1 07/02/2016 1139   CL 105 08/27/2017 1140   CL 105 03/11/2017 1404   CO2 28 08/27/2017 1140   CO2 31 03/11/2017 1404   CO2 26 07/02/2016 1139   BUN 16 08/27/2017 1140   BUN 15 03/11/2017 1404   BUN 20.3 07/02/2016 1139   CREATININE 0.91 08/27/2017 1140   CREATININE 0.7 03/11/2017 1404   CREATININE 0.8 07/02/2016 1139      Component Value Date/Time   CALCIUM 9.9 08/27/2017 1140   CALCIUM 9.4 03/11/2017 1404   CALCIUM 10.0 07/02/2016 1139   ALKPHOS 73 08/27/2017 1140   ALKPHOS 69 03/11/2017 1404   ALKPHOS 87 07/02/2016 1139   AST 36 (H) 08/27/2017 1140   AST 27 07/02/2016 1139   ALT 36 08/27/2017 1140   ALT 32 03/11/2017 1404   ALT 34 07/02/2016 1139   BILITOT 0.5 08/27/2017 1140   BILITOT 0.55 07/02/2016 1139      Impression and Plan: Lori [name] is a pleasant 72 yo caucasian female with hemochromatosis, heterozygous for the C282Y mutations. She is symptomatic as mentioned above.  We will proceed with phelbotomy today as planned.  We will do a lab only in 6 weeks (phlebotomy if needed) and follow-up with lab and phlebotomy in 3 months.  She will contact our office with any questions or concerns. We can certainly see her sooner if need be.   Laverna Peace, NP 5/21/20192:15 PM

## 2017-10-13 NOTE — Patient Instructions (Signed)

## 2017-10-13 NOTE — Progress Notes (Signed)
Lori [name] presents today for phlebotomy per MD orders. Phlebotomy procedure started at 1520 and ended at 1540. 520 cc removed via 20 G needle at L antecubital site. Two more unsuccessfully attempts were made prior on her posterior forearms per patient preference (L posterior forearm at 1505 with a 20 G needle by myself, and R posterior forearm at 1510 with a 18 G needle by Thane Edu, RN ). Patient tolerated procedure well. IV needle removed intact.

## 2017-10-14 LAB — IRON AND TIBC
Iron: 88 ug/dL (ref 41–142)
SATURATION RATIOS: 33 % (ref 21–57)
TIBC: 268 ug/dL (ref 236–444)
UIBC: 179 ug/dL

## 2017-10-14 LAB — FERRITIN: Ferritin: 27 ng/mL (ref 9–269)

## 2017-10-22 ENCOUNTER — Ambulatory Visit
Admission: RE | Admit: 2017-10-22 | Discharge: 2017-10-22 | Disposition: A | Discharge status: Home / Self Care | Payer: PPO | Source: Ambulatory Visit | Attending: Internal Medicine | Admitting: Internal Medicine

## 2017-10-22 ENCOUNTER — Other Ambulatory Visit: Payer: Self-pay | Admitting: Internal Medicine

## 2017-10-22 DIAGNOSIS — K5909 Other constipation: Secondary | ICD-10-CM | POA: Diagnosis not present

## 2017-10-22 DIAGNOSIS — R140 Abdominal distension (gaseous): Secondary | ICD-10-CM | POA: Diagnosis not present

## 2017-10-22 DIAGNOSIS — N390 Urinary tract infection, site not specified: Secondary | ICD-10-CM | POA: Diagnosis not present

## 2017-10-22 DIAGNOSIS — R109 Unspecified abdominal pain: Secondary | ICD-10-CM

## 2017-10-22 DIAGNOSIS — R002 Palpitations: Secondary | ICD-10-CM | POA: Diagnosis not present

## 2017-10-22 DIAGNOSIS — R300 Dysuria: Secondary | ICD-10-CM | POA: Diagnosis not present

## 2017-10-22 DIAGNOSIS — R1030 Lower abdominal pain, unspecified: Secondary | ICD-10-CM | POA: Diagnosis not present

## 2017-10-30 DIAGNOSIS — Z1211 Encounter for screening for malignant neoplasm of colon: Secondary | ICD-10-CM | POA: Diagnosis not present

## 2017-10-30 DIAGNOSIS — K5792 Diverticulitis of intestine, part unspecified, without perforation or abscess without bleeding: Secondary | ICD-10-CM | POA: Diagnosis not present

## 2017-11-17 DIAGNOSIS — M1612 Unilateral primary osteoarthritis, left hip: Secondary | ICD-10-CM | POA: Diagnosis not present

## 2017-11-17 DIAGNOSIS — M1611 Unilateral primary osteoarthritis, right hip: Secondary | ICD-10-CM | POA: Diagnosis not present

## 2017-11-17 DIAGNOSIS — M1711 Unilateral primary osteoarthritis, right knee: Secondary | ICD-10-CM | POA: Diagnosis not present

## 2017-11-24 ENCOUNTER — Inpatient Hospital Stay: Payer: PPO | Attending: Hematology & Oncology

## 2017-12-10 DIAGNOSIS — H524 Presbyopia: Secondary | ICD-10-CM | POA: Diagnosis not present

## 2017-12-10 DIAGNOSIS — H43812 Vitreous degeneration, left eye: Secondary | ICD-10-CM | POA: Diagnosis not present

## 2017-12-10 DIAGNOSIS — H16223 Keratoconjunctivitis sicca, not specified as Sjogren's, bilateral: Secondary | ICD-10-CM | POA: Diagnosis not present

## 2017-12-10 DIAGNOSIS — H5203 Hypermetropia, bilateral: Secondary | ICD-10-CM | POA: Diagnosis not present

## 2017-12-10 DIAGNOSIS — H2513 Age-related nuclear cataract, bilateral: Secondary | ICD-10-CM | POA: Diagnosis not present

## 2017-12-10 DIAGNOSIS — H40013 Open angle with borderline findings, low risk, bilateral: Secondary | ICD-10-CM | POA: Diagnosis not present

## 2017-12-10 DIAGNOSIS — H52223 Regular astigmatism, bilateral: Secondary | ICD-10-CM | POA: Diagnosis not present

## 2017-12-25 DIAGNOSIS — Z86010 Personal history of colonic polyps: Secondary | ICD-10-CM | POA: Diagnosis not present

## 2017-12-25 DIAGNOSIS — K648 Other hemorrhoids: Secondary | ICD-10-CM | POA: Diagnosis not present

## 2017-12-25 DIAGNOSIS — K5730 Diverticulosis of large intestine without perforation or abscess without bleeding: Secondary | ICD-10-CM | POA: Diagnosis not present

## 2017-12-25 DIAGNOSIS — K6389 Other specified diseases of intestine: Secondary | ICD-10-CM | POA: Diagnosis not present

## 2017-12-25 DIAGNOSIS — Z1211 Encounter for screening for malignant neoplasm of colon: Secondary | ICD-10-CM | POA: Diagnosis not present

## 2017-12-25 DIAGNOSIS — D122 Benign neoplasm of ascending colon: Secondary | ICD-10-CM | POA: Diagnosis not present

## 2018-01-04 ENCOUNTER — Inpatient Hospital Stay: Payer: PPO | Attending: Hematology & Oncology

## 2018-01-06 ENCOUNTER — Inpatient Hospital Stay: Payer: PPO | Admitting: Family

## 2018-01-06 ENCOUNTER — Inpatient Hospital Stay: Payer: PPO

## 2018-01-08 DIAGNOSIS — R7309 Other abnormal glucose: Secondary | ICD-10-CM | POA: Diagnosis not present

## 2018-01-08 DIAGNOSIS — K589 Irritable bowel syndrome without diarrhea: Secondary | ICD-10-CM | POA: Diagnosis not present

## 2018-01-08 DIAGNOSIS — N951 Menopausal and female climacteric states: Secondary | ICD-10-CM | POA: Diagnosis not present

## 2018-01-21 DIAGNOSIS — Z85828 Personal history of other malignant neoplasm of skin: Secondary | ICD-10-CM | POA: Diagnosis not present

## 2018-01-21 DIAGNOSIS — L738 Other specified follicular disorders: Secondary | ICD-10-CM | POA: Diagnosis not present

## 2018-01-21 DIAGNOSIS — L720 Epidermal cyst: Secondary | ICD-10-CM | POA: Diagnosis not present

## 2018-02-04 DIAGNOSIS — B9620 Unspecified Escherichia coli [E. coli] as the cause of diseases classified elsewhere: Secondary | ICD-10-CM | POA: Diagnosis not present

## 2018-02-04 DIAGNOSIS — N390 Urinary tract infection, site not specified: Secondary | ICD-10-CM | POA: Diagnosis not present

## 2018-02-04 DIAGNOSIS — R319 Hematuria, unspecified: Secondary | ICD-10-CM | POA: Diagnosis not present

## 2018-02-05 ENCOUNTER — Other Ambulatory Visit: Payer: Self-pay | Admitting: Podiatry

## 2018-02-05 ENCOUNTER — Encounter: Payer: Self-pay | Admitting: Podiatry

## 2018-02-05 ENCOUNTER — Ambulatory Visit (INDEPENDENT_AMBULATORY_CARE_PROVIDER_SITE_OTHER): Payer: PPO

## 2018-02-05 ENCOUNTER — Telehealth: Payer: Self-pay | Admitting: *Deleted

## 2018-02-05 ENCOUNTER — Ambulatory Visit: Payer: PPO | Admitting: Podiatry

## 2018-02-05 DIAGNOSIS — M79671 Pain in right foot: Secondary | ICD-10-CM | POA: Diagnosis not present

## 2018-02-05 DIAGNOSIS — M79672 Pain in left foot: Secondary | ICD-10-CM

## 2018-02-05 DIAGNOSIS — M779 Enthesopathy, unspecified: Secondary | ICD-10-CM

## 2018-02-05 DIAGNOSIS — M722 Plantar fascial fibromatosis: Secondary | ICD-10-CM | POA: Diagnosis not present

## 2018-02-05 MED ORDER — TRIAMCINOLONE ACETONIDE 10 MG/ML IJ SUSP: 10.0000 mg | Freq: Once | INTRAMUSCULAR | Status: DC

## 2018-02-05 NOTE — Telephone Encounter (Signed)
I will faxed required DME form, clinicals of 02/05/2018 when received and Demographics to [facility].

## 2018-02-06 NOTE — Progress Notes (Signed)
Subjective:   Patient ID: Lori [name], female   DOB: 72 y.o.   MRN: [id number]   HPI Patient presents stating she is developed a lot of pain in her right ankle and the nerve on her left foot still bothers her but not to the same degree with all symptoms some discomfort in the back of the left heel   ROS      Objective:  Physical Exam  Neurovascular status unchanged with patient having moderate systemic signs of inflammation infection that are being treated by other doctors with patient noted to have acute discomfort in the sinus tarsi area right is also noted to have chronic neuroma symptomatology left and moderate Achilles tendinitis left     Assessment:  Acute plantar fasciitis right with inflammation and moderate chronic neuroma symptoms left and mild Achilles tendinitis which is probably compensatory     Plan:  H&P conditions reviewed and today I injected the sinus tarsi right 3 mg Kenalog 5 mg Xylocaine discussed left and we will just use stretching exercises and I also dispensed fascial brace right to reduce motion.  Reappoint 2 weeks to recheck or earlier if indicated and she needed the fascial brace as she is having a lot of pain but inverted inverted the right ankle.  Patient will be reevaluated in 2 weeks.  For the left foot I have recommended continued wider shoes and we will see note whether or not we will have to treat that depending on response  X-rays were negative for signs of fracture or did not indicate any form of advanced arthritic process

## 2018-02-08 DIAGNOSIS — B9620 Unspecified Escherichia coli [E. coli] as the cause of diseases classified elsewhere: Secondary | ICD-10-CM | POA: Diagnosis not present

## 2018-02-08 DIAGNOSIS — N390 Urinary tract infection, site not specified: Secondary | ICD-10-CM | POA: Diagnosis not present

## 2018-02-22 DIAGNOSIS — E349 Endocrine disorder, unspecified: Secondary | ICD-10-CM | POA: Insufficient documentation

## 2018-02-25 DIAGNOSIS — N390 Urinary tract infection, site not specified: Secondary | ICD-10-CM | POA: Diagnosis not present

## 2018-02-25 DIAGNOSIS — E038 Other specified hypothyroidism: Secondary | ICD-10-CM | POA: Diagnosis not present

## 2018-02-25 DIAGNOSIS — M810 Age-related osteoporosis without current pathological fracture: Secondary | ICD-10-CM | POA: Diagnosis not present

## 2018-02-25 DIAGNOSIS — R319 Hematuria, unspecified: Secondary | ICD-10-CM | POA: Diagnosis not present

## 2018-02-25 DIAGNOSIS — R82998 Other abnormal findings in urine: Secondary | ICD-10-CM | POA: Diagnosis not present

## 2018-02-25 DIAGNOSIS — R7301 Impaired fasting glucose: Secondary | ICD-10-CM | POA: Diagnosis not present

## 2018-02-25 DIAGNOSIS — E7849 Other hyperlipidemia: Secondary | ICD-10-CM | POA: Diagnosis not present

## 2018-02-25 DIAGNOSIS — R7989 Other specified abnormal findings of blood chemistry: Secondary | ICD-10-CM | POA: Diagnosis not present

## 2018-03-05 ENCOUNTER — Ambulatory Visit: Payer: PPO | Admitting: Podiatry

## 2018-03-12 DIAGNOSIS — M351 Other overlap syndromes: Secondary | ICD-10-CM | POA: Diagnosis not present

## 2018-03-12 DIAGNOSIS — M2550 Pain in unspecified joint: Secondary | ICD-10-CM | POA: Diagnosis not present

## 2018-03-12 DIAGNOSIS — Z6825 Body mass index (BMI) 25.0-25.9, adult: Secondary | ICD-10-CM | POA: Diagnosis not present

## 2018-03-12 DIAGNOSIS — Z1389 Encounter for screening for other disorder: Secondary | ICD-10-CM | POA: Diagnosis not present

## 2018-03-12 DIAGNOSIS — N390 Urinary tract infection, site not specified: Secondary | ICD-10-CM | POA: Diagnosis not present

## 2018-03-12 DIAGNOSIS — R5382 Chronic fatigue, unspecified: Secondary | ICD-10-CM | POA: Diagnosis not present

## 2018-03-12 DIAGNOSIS — Z0000 Encounter for general adult medical examination without abnormal findings: Secondary | ICD-10-CM | POA: Diagnosis not present

## 2018-03-12 DIAGNOSIS — E038 Other specified hypothyroidism: Secondary | ICD-10-CM | POA: Diagnosis not present

## 2018-03-12 DIAGNOSIS — E7849 Other hyperlipidemia: Secondary | ICD-10-CM | POA: Diagnosis not present

## 2018-03-12 DIAGNOSIS — M810 Age-related osteoporosis without current pathological fracture: Secondary | ICD-10-CM | POA: Diagnosis not present

## 2018-03-12 DIAGNOSIS — R300 Dysuria: Secondary | ICD-10-CM | POA: Diagnosis not present

## 2018-03-12 DIAGNOSIS — R7301 Impaired fasting glucose: Secondary | ICD-10-CM | POA: Diagnosis not present

## 2018-04-06 ENCOUNTER — Inpatient Hospital Stay: Payer: PPO | Attending: Hematology & Oncology

## 2018-04-06 DIAGNOSIS — D2362 Other benign neoplasm of skin of left upper limb, including shoulder: Secondary | ICD-10-CM | POA: Diagnosis not present

## 2018-04-06 DIAGNOSIS — L821 Other seborrheic keratosis: Secondary | ICD-10-CM | POA: Diagnosis not present

## 2018-04-06 DIAGNOSIS — Z85828 Personal history of other malignant neoplasm of skin: Secondary | ICD-10-CM | POA: Diagnosis not present

## 2018-04-06 DIAGNOSIS — L570 Actinic keratosis: Secondary | ICD-10-CM | POA: Diagnosis not present

## 2018-04-06 LAB — CMP (CANCER CENTER ONLY)
ALBUMIN: 4.1 g/dL (ref 3.5–5.0)
ALK PHOS: 81 U/L (ref 38–126)
ALT: 33 U/L (ref 0–44)
ANION GAP: 10 (ref 5–15)
AST: 34 U/L (ref 15–41)
BILIRUBIN TOTAL: 0.4 mg/dL (ref 0.3–1.2)
BUN: 14 mg/dL (ref 8–23)
CALCIUM: 10 mg/dL (ref 8.9–10.3)
CO2: 26 mmol/L (ref 22–32)
CREATININE: 0.81 mg/dL (ref 0.44–1.00)
Chloride: 104 mmol/L (ref 98–111)
GFR, Est AFR Am: >60 mL/min (ref >60)
GFR, Estimated: >60 mL/min (ref >60)
Glucose, Bld: 114 mg/dL — ABNORMAL HIGH (ref 70–99)
Potassium: 4.4 mmol/L (ref 3.5–5.1)
Sodium: 140 mmol/L (ref 135–145)
TOTAL PROTEIN: 7.6 g/dL (ref 6.5–8.1)

## 2018-04-06 LAB — CBC WITH DIFFERENTIAL (CANCER CENTER ONLY)
Abs Immature Granulocytes: 0.02 10*3/uL (ref 0.00–0.07)
BASOS ABS: 0 10*3/uL (ref 0.0–0.1)
Basophils Relative: 1 %
Eosinophils Absolute: 0.1 10*3/uL (ref 0.0–0.5)
Eosinophils Relative: 1 %
HEMATOCRIT: 45.7 % (ref 36.0–46.0)
HEMOGLOBIN: 14.7 g/dL (ref 12.0–15.0)
Immature Granulocytes: 0 %
LYMPHS ABS: 2.8 10*3/uL (ref 0.7–4.0)
LYMPHS PCT: 43 %
MCH: 30.9 pg (ref 26.0–34.0)
MCHC: 32.2 g/dL (ref 30.0–36.0)
MCV: 96.2 fL (ref 80.0–100.0)
MONO ABS: 0.5 10*3/uL (ref 0.1–1.0)
Monocytes Relative: 8 %
NRBC: 0 % (ref 0.0–0.2)
Neutro Abs: 3 10*3/uL (ref 1.7–7.7)
Neutrophils Relative %: 47 %
Platelet Count: 197 10*3/uL (ref 150–400)
RBC: 4.75 MIL/uL (ref 3.87–5.11)
RDW: 14 % (ref 11.5–15.5)
WBC Count: 6.4 10*3/uL (ref 4.0–10.5)

## 2018-04-07 LAB — IRON AND TIBC
Iron: 153 ug/dL — ABNORMAL HIGH (ref 41–142)
SATURATION RATIOS: 52 % (ref 21–57)
TIBC: 291 ug/dL (ref 236–444)
UIBC: 139 ug/dL (ref 120–384)

## 2018-04-07 LAB — FERRITIN: FERRITIN: 22 ng/mL (ref 11–307)

## 2018-04-12 NOTE — Progress Notes (Signed)
[facility] Note  04/13/2018     CHIEF COMPLAINT Patient presents for Retina Follow Up   HISTORY OF PRESENT ILLNESS: Lori [name] is a 72 y.o. female who presents to the clinic today for:   HPI    Retina Follow Up    Patient presents with  PVD.  In left eye.  This started months ago.  Severity is mild.  Duration of months.  Since onset it is stable.  I, the attending physician,  performed the HPI with the patient and updated documentation appropriately.          Comments    72 y/o female pt here for 6 mo f/u for PVD OS.  No change in [place] OU.  Denies pain, flashes, new floaters.  Sometimes feels a "sensation of pressure" OU, that comes and goes erratically.  Feels VA OU is "always bad, but sometimes worse," and that "vision with glasses is not as good as it should be."  Coloidal Silver gtts bid OU Similsan allergy gtts prn OU PFAT prn OU Systane prn OU       Last edited by Bernarda Caffey, MD on 04/13/2018 11:09 AM. (History)    pt states there is nothing new with her vision other than it seems to come and go, she states it is sometimes hard to see at a distance   Referring physician: Marton Redwood, MD [place], [place] [address]  HISTORICAL INFORMATION:   Selected notes from the [facility] Referred by Dr. Calvert Cantor for concern of PVD OD LEE:  Ocular Hx- PMH-    CURRENT MEDICATIONS: No current outpatient medications on file. (Ophthalmic Drugs)   No current facility-administered medications for this visit.  (Ophthalmic Drugs)   Current Outpatient Medications (Other)  Medication Sig  . amiloride-hydrochlorothiazide (MODURETIC) 5-50 MG tablet   . Ascorbic Acid (VITAMIN C) 1000 MG tablet Take 1,000 mg by mouth daily.   Marland Kitchen b complex vitamins tablet Take by mouth.  . benzonatate (TESSALON) 100 MG capsule   . Biotin 5 MG CAPS Take 5 mg by mouth.  . BORON PO Take by mouth daily.  . calcium acetate (PHOSLO) 667 MG  capsule Take by mouth 3 (three) times daily with meals.  . Calcium-Magnesium-Vitamin D (CALCIUM MAGNESIUM PO) Take by mouth at bedtime.  . Cholecalciferol (VITAMIN D PO) Take 1 tablet by mouth daily.  . Coenzyme Q10 (CO Q-10) 100 MG CAPS Take by mouth.  . dextroamphetamine (DEXEDRINE SPANSULE) 5 MG 24 hr capsule Take 5 mg by mouth daily.  Marland Kitchen doxycycline (VIBRA-TABS) 100 MG tablet Take 1 tablet (100 mg total) by mouth 2 (two) times daily.  . fish oil-omega-3 fatty acids 1000 MG capsule Take 1 g by mouth daily.  . fluconazole (DIFLUCAN) 200 MG tablet Take by mouth.  Marland Kitchen GLUTAMINE PO Take 1 tablet by mouth 2 (two) times daily.  Marland Kitchen L-Lysine HCl 500 MG TABS Take by mouth.  . LYSINE PO Take 1 tablet by mouth 2 (two) times daily.   . Melatonin CR 3 MG TBCR Take by mouth.  . MELATONIN PO Take 2 mg by mouth at bedtime.   . Menaquinone-7 (VITAMIN K2 PO) Take by mouth once.  . Methylsulfonylmethane (MSM) POWD Take by mouth.  . metoprolol succinate (TOPROL-XL) 25 MG 24 hr tablet TAKE 1 TABLET IN AM AND 1/2 TABLET IN THE PM.  . Multiple Vitamin (MULTIVITAMIN) tablet Take 1 tablet by mouth daily.  . NON FORMULARY  Bio-est Cream  . NON FORMULARY Pregnolol cap  . NONFORMULARY OR COMPOUNDED ITEM Boric Acid 600 mg caps. S: insert vaginally twice daily x 2 weeks then twice weekly to complete 3 mos of treatment.  Marland Kitchen omega-3 acid ethyl esters (LOVAZA) 1 g capsule Take by mouth.  . potassium chloride SA (K-DUR,KLOR-CON) 20 MEQ tablet Take 1 tablet by mouth daily.  . Prasterone, DHEA, 10 MG CAPS Take 7.5 mg by mouth daily.  . progesterone (PROMETRIUM) 200 MG capsule Take 200 mg by mouth daily.  . Thyroid (NATURE-THROID) 97.5 MG TABS Take 1 tablet by mouth daily.   Current Facility-Administered Medications (Other)  Medication Route  . triamcinolone acetonide (KENALOG) 10 MG/ML injection 10 mg Other  . triamcinolone acetonide (KENALOG) 10 MG/ML injection 10 mg Other      REVIEW OF SYSTEMS: ROS    Positive for:  Eyes   Negative for: Constitutional, Gastrointestinal, Neurological, Skin, Genitourinary, Musculoskeletal, HENT, Endocrine, Cardiovascular, Respiratory, Psychiatric, Allergic/Imm, Heme/Lymph   Last edited by Matthew Folks, COA on 04/13/2018 10:20 AM. (History)       ALLERGIES Allergies  Allergen Reactions  . Albumin (Human)     Pt stated, "I am not allergic to this and don't know what this means"; 04/27/17  . Amoxicillin Nausea And Vomiting  . Codeine Nausea Only  . Erythromycin Nausea Only  . Iodinated Diagnostic Agents Other (See Comments)  . Keflex [Cephalexin] Itching  . Meperidine     Pt stated, "I don't know what this is"; 04/27/17  . Metoclopramide Hcl   . Other Other (See Comments)    Beef Chicken Dairy Eggs  Pt stated, "My body can't process protein; it makes me sick"  . Shellfish Allergy Other (See Comments)    Pt stated, "I am not allergic to this anymore"; 04/27/17  . Shellfish-Derived Products   . Oxycodone Nausea And Vomiting    Pt stated, "I do not know if I am allergic to this"; 04/27/17    PAST MEDICAL HISTORY Past Medical History:  Diagnosis Date  . Cancer ([place]) 06/2017   SQUAMOS CELL REMOVED FROM FOREHEAD  . Chronic fatigue   . Hormone disorder   . Hypothyroidism   . IBS (irritable bowel syndrome)   . Lupus ([place])   . Osteoporosis 07/2017   T score -2.7 stable from prior DEXA  . Palpitations   . Rheumatoid arthritis [facility])    Past Surgical History:  Procedure Laterality Date  . ABDOMINAL SURGERY     ABDOMINOPLASTY  . BLADDER SUSPENSION    . BREAST SURGERY  1980   LUMPECTOMY X 2 FROM RIGHT BREAST  . CARDIOVASCULAR STRESS TEST  08/28/2011   Normal  . CAROTID DOPPLER  08/28/2011   Normal, no evidence of significant diameter reduction, dissection, or vascular abnormality  . COLONOSCOPY W/ POLYPECTOMY  2018  . EYE SURGERY    . LAPAROSCOPIC CHOLECYSTECTOMY    . TONSILLECTOMY AND ADENOIDECTOMY    . TRANSTHORACIC ECHOCARDIOGRAM  01/29/2011   EF >55%,  mild concentric LVH  . TUBAL LIGATION      FAMILY HISTORY Family History  Problem Relation Age of Onset  . Seizures Mother   . Transient ischemic attack Mother   . Glaucoma Mother   . Cancer Father 74       COLON  . Diabetes Father   . Heart disease Father   . Breast cancer Maternal Aunt   . Heart disease Paternal Uncle   . Breast cancer Maternal Aunt   . Glaucoma  Maternal Grandmother     SOCIAL HISTORY Social History   Tobacco Use  . Smoking status: Never Smoker  . Smokeless tobacco: Never Used  Substance Use Topics  . Alcohol use: Yes    Alcohol/week: 0.0 standard drinks    Comment: RARE  . Drug use: No         OPHTHALMIC EXAM:  Base Eye Exam    Visual Acuity (Snellen - Linear)      Right Left   Dist cc 20/20 -2 20/25   Dist ph cc  NI   Correction:  Glasses       Tonometry (Tonopen, 10:33 AM)      Right Left   Pressure 17 17       Pupils      Dark Light Shape React APD   Right 2 1.5 Round Brisk None   Left 2 1.5 Round Brisk None       Visual Fields (Counting fingers)      Left Right    Full Full       Extraocular Movement      Right Left    Full, Ortho Full, Ortho       Neuro/Psych    Oriented x3:  Yes   Mood/Affect:  Normal       Dilation    Both eyes:  1.0% Mydriacyl, 2.5% Phenylephrine @ 10:33 AM        Slit Lamp and Fundus Exam    Slit Lamp Exam      Right Left   Lids/Lashes Dermatochalasis - upper lid Dermatochalasis - upper lid   Conjunctiva/Sclera White and quiet White and quiet   Cornea Arcus, 3+ diffuse central Punctate epithelial erosions, irregular epithelium Arcus, 1+Punctate epithelial erosions   Anterior Chamber Deep and quiet, no cell or flare Deep and quiet, no cell or flare   Iris Round and dilated Round and dilated   Lens 2+ Nuclear sclerosis, 2+ Cortical cataract 2+ Nuclear sclerosis, 2+ Cortical cataract   Vitreous Vitreous syneresis Vitreous syneresis, Posterior vitreous detachment, vitreous condensation  lineral circumferential at 0430       Fundus Exam      Right Left   Disc Pink and Sharp Tilted disc   C/D Ratio 0.3 0.4   Macula Flat, mild Retinal pigment epithelial mottling, No heme or edema Good foveal reflex, Flat, mild Retinal pigment epithelial mottling, No heme or edema   Vessels Normal Vascular attenuation, Copper wiring   Periphery Attached Attached, inf temporal retinoschisis from 0500 to 0600, Lattice degeneration from 0430 to 0530          IMAGING AND PROCEDURES  Imaging and Procedures for 09/17/17  OCT, Retina - OU - Both Eyes       Right Eye Quality was good. Central Foveal Thickness: 277. Progression has been stable. Findings include normal foveal contour, no IRF, no SRF.   Left Eye Quality was good. Central Foveal Thickness: 273. Progression has been stable. Findings include normal foveal contour, no IRF, no SRF.   Notes *Images captured and stored on drive  Diagnosis / Impression:  NFP, No IRF/SRF OU  Clinical management:  See below  Abbreviations: NFP - Normal foveal profile. CME - cystoid macular edema. PED - pigment epithelial detachment. IRF - intraretinal fluid. SRF - subretinal fluid. EZ - ellipsoid zone. ERM - epiretinal membrane. ORA - outer retinal atrophy. ORT - outer retinal tubulation. SRHM - subretinal hyper-reflective material  ASSESSMENT/PLAN:    ICD-10-CM   1. Posterior vitreous detachment of left eye H43.812   2. 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 Retinal edema H35.81 OCT, Retina - OU - Both Eyes  4. Combined form of age-related cataract, both eyes H25.813     1. PVD / vitreous syneresis OS  Discussed findings and prognosis  No RT or RD on repeat scleral depressed exam  Reviewed s/s of RT/RD  Strict return precautions for any such RT/RD signs/symptoms  F/u 6-9 months, sooner PRN  2. Retinoschisis OS - inferotemporal retinoschisis cavity -- stable from prior - no RT/RD - discussed findings, prognosis -  monitor for now - F/U 6-9 months  3. No retinal edema on exam or OCT  4. Combined form age-related cataract OU-  - The symptoms of cataract, surgical options, and treatments and risks were discussed with patient. - discussed diagnosis and progression - not yet visually significant - monitor for now    Ophthalmic Meds Ordered this visit:  No orders of the defined types were placed in this encounter.      Return for F/U 6-9 months, Retinoschisis.  There are no Patient Instructions on file for this visit.   Explained the diagnoses, plan, and follow up with the patient and they expressed understanding.  Patient expressed understanding of the importance of proper follow up care.   This document serves as a record of services personally performed by Gardiner Sleeper, MD, PhD. It was created on their behalf by Ernest Mallick, OA, an ophthalmic assistant. The creation of this record is the provider's dictation and/or activities during the visit.    Electronically signed by: Ernest Mallick, OA  11.18.19 2:32 PM      Gardiner Sleeper, M.D., Ph.D. Diseases & Surgery of the Retina and Vitreous [facility]   I have reviewed the above documentation for accuracy and completeness, and I agree with the above. Gardiner Sleeper, M.D., Ph.D. 04/15/18 2:32 PM     Abbreviations: M myopia (nearsighted); A astigmatism; H hyperopia (farsighted); P presbyopia; Mrx spectacle prescription;  CTL contact lenses; OD right eye; OS left eye; OU both eyes  XT exotropia; ET esotropia; PEK punctate epithelial keratitis; PEE punctate epithelial erosions; DES dry eye syndrome; MGD meibomian gland dysfunction; ATs artificial tears; PFAT's preservative free artificial tears; [place] nuclear sclerotic cataract; PSC posterior subcapsular cataract; ERM epi-retinal membrane; PVD posterior vitreous detachment; RD retinal detachment; DM diabetes mellitus; DR diabetic retinopathy; NPDR non-proliferative  diabetic retinopathy; PDR proliferative diabetic retinopathy; CSME clinically significant macular edema; DME diabetic macular edema; dbh dot blot hemorrhages; CWS cotton wool spot; POAG primary open angle glaucoma; C/D cup-to-disc ratio; HVF humphrey visual field; GVF goldmann visual field; OCT optical coherence tomography; IOP intraocular pressure; BRVO Branch retinal vein occlusion; CRVO central retinal vein occlusion; CRAO central retinal artery occlusion; BRAO branch retinal artery occlusion; RT retinal tear; SB scleral buckle; PPV pars plana vitrectomy; VH Vitreous hemorrhage; PRP panretinal laser photocoagulation; IVK intravitreal kenalog; VMT vitreomacular traction; MH Macular hole;  NVD neovascularization of the disc; NVE neovascularization elsewhere; AREDS age related eye disease study; ARMD age related macular degeneration; POAG primary open angle glaucoma; EBMD epithelial/anterior basement membrane dystrophy; ACIOL anterior chamber intraocular lens; IOL intraocular lens; PCIOL posterior chamber intraocular lens; Phaco/IOL phacoemulsification with intraocular lens placement; [place] photorefractive keratectomy; LASIK laser assisted in situ keratomileusis; HTN hypertension; DM diabetes mellitus; COPD chronic obstructive pulmonary disease

## 2018-04-13 ENCOUNTER — Encounter (INDEPENDENT_AMBULATORY_CARE_PROVIDER_SITE_OTHER): Payer: Self-pay | Admitting: Ophthalmology

## 2018-04-13 ENCOUNTER — Ambulatory Visit (INDEPENDENT_AMBULATORY_CARE_PROVIDER_SITE_OTHER): Payer: PPO | Admitting: Ophthalmology

## 2018-04-13 DIAGNOSIS — H3581 Retinal edema: Secondary | ICD-10-CM

## 2018-04-13 DIAGNOSIS — N390 Urinary tract infection, site not specified: Secondary | ICD-10-CM | POA: Diagnosis not present

## 2018-04-13 DIAGNOSIS — H43812 Vitreous degeneration, left eye: Secondary | ICD-10-CM | POA: Diagnosis not present

## 2018-04-13 DIAGNOSIS — H33102 Unspecified retinoschisis, left eye: Secondary | ICD-10-CM

## 2018-04-13 DIAGNOSIS — H25813 Combined forms of age-related cataract, bilateral: Secondary | ICD-10-CM

## 2018-04-15 ENCOUNTER — Encounter (INDEPENDENT_AMBULATORY_CARE_PROVIDER_SITE_OTHER): Payer: Self-pay | Admitting: Ophthalmology

## 2018-04-15 DIAGNOSIS — B961 Klebsiella pneumoniae [K. pneumoniae] as the cause of diseases classified elsewhere: Secondary | ICD-10-CM | POA: Diagnosis not present

## 2018-04-15 DIAGNOSIS — N390 Urinary tract infection, site not specified: Secondary | ICD-10-CM | POA: Diagnosis not present

## 2018-05-10 ENCOUNTER — Ambulatory Visit: Payer: PPO | Admitting: Podiatry

## 2018-05-17 ENCOUNTER — Encounter: Payer: Self-pay | Admitting: Podiatry

## 2018-05-17 ENCOUNTER — Ambulatory Visit (INDEPENDENT_AMBULATORY_CARE_PROVIDER_SITE_OTHER): Payer: PPO | Admitting: Podiatry

## 2018-05-17 DIAGNOSIS — M779 Enthesopathy, unspecified: Secondary | ICD-10-CM | POA: Diagnosis not present

## 2018-05-17 MED ORDER — TRIAMCINOLONE ACETONIDE 10 MG/ML IJ SUSP: 10.0000 mg | Freq: Once | INTRAMUSCULAR | Status: DC

## 2018-05-18 NOTE — Progress Notes (Signed)
Subjective:   Patient ID: Lori [name], female   DOB: 72 y.o.   MRN: [id number]   HPI Patient states the right ankle has been very tender again and needs injection and states it is effective   ROS      Objective:  Physical Exam  Neurovascular status intact with discomfort in the right sinus tarsi which has improved some but is very tender after period of several months     Assessment:  Sinus tarsitis right inflamed and painful     Plan:  Sterile prep and injected the sinus tarsi right 3 mg Kenalog 5 mg Xylocaine and advised this patient on continued anti-inflammatories and supportive shoes and will be seen back as needed

## 2018-07-13 ENCOUNTER — Telehealth: Payer: Self-pay | Admitting: Family

## 2018-07-13 ENCOUNTER — Other Ambulatory Visit: Payer: Self-pay | Admitting: *Deleted

## 2018-07-13 ENCOUNTER — Other Ambulatory Visit: Payer: Self-pay | Admitting: Family

## 2018-07-13 NOTE — Telephone Encounter (Signed)
Appointment scheduled and LMVM for patient with date/time.  I asked her call back to confirm this appointment per 2/17 VM from patient/ ok per [facility]

## 2018-07-15 ENCOUNTER — Inpatient Hospital Stay: Payer: PPO | Attending: Hematology & Oncology

## 2018-07-15 LAB — CBC WITH DIFFERENTIAL (CANCER CENTER ONLY)
Abs Immature Granulocytes: 0.01 10*3/uL (ref 0.00–0.07)
Basophils Absolute: 0 10*3/uL (ref 0.0–0.1)
Basophils Relative: 1 %
Eosinophils Absolute: 0 10*3/uL (ref 0.0–0.5)
Eosinophils Relative: 1 %
HEMATOCRIT: 43 % (ref 36.0–46.0)
Hemoglobin: 14.4 g/dL (ref 12.0–15.0)
IMMATURE GRANULOCYTES: 0 %
LYMPHS ABS: 2.5 10*3/uL (ref 0.7–4.0)
Lymphocytes Relative: 40 %
MCH: 32.3 pg (ref 26.0–34.0)
MCHC: 33.5 g/dL (ref 30.0–36.0)
MCV: 96.4 fL (ref 80.0–100.0)
Monocytes Absolute: 0.7 10*3/uL (ref 0.1–1.0)
Monocytes Relative: 12 %
Neutro Abs: 2.9 10*3/uL (ref 1.7–7.7)
Neutrophils Relative %: 46 %
Platelet Count: 224 10*3/uL (ref 150–400)
RBC: 4.46 MIL/uL (ref 3.87–5.11)
RDW: 12.9 % (ref 11.5–15.5)
WBC Count: 6.2 10*3/uL (ref 4.0–10.5)
nRBC: 0 % (ref 0.0–0.2)

## 2018-07-15 LAB — CMP (CANCER CENTER ONLY)
ALT: 37 U/L (ref 0–44)
AST: 35 U/L (ref 15–41)
Albumin: 4.3 g/dL (ref 3.5–5.0)
Alkaline Phosphatase: 78 U/L (ref 38–126)
Anion gap: 8 (ref 5–15)
BUN: 14 mg/dL (ref 8–23)
CO2: 29 mmol/L (ref 22–32)
CREATININE: 0.77 mg/dL (ref 0.44–1.00)
Calcium: 10 mg/dL (ref 8.9–10.3)
Chloride: 105 mmol/L (ref 98–111)
GFR, Est AFR Am: >60 mL/min (ref >60)
GFR, Estimated: >60 mL/min (ref >60)
Glucose, Bld: 97 mg/dL (ref 70–99)
Potassium: 4.8 mmol/L (ref 3.5–5.1)
Sodium: 142 mmol/L (ref 135–145)
Total Bilirubin: 0.5 mg/dL (ref 0.3–1.2)
Total Protein: 6.3 g/dL — ABNORMAL LOW (ref 6.5–8.1)

## 2018-07-16 ENCOUNTER — Telehealth: Payer: Self-pay | Admitting: *Deleted

## 2018-07-16 LAB — IRON AND TIBC
Iron: 204 ug/dL — ABNORMAL HIGH (ref 41–142)
Saturation Ratios: 77 % — ABNORMAL HIGH (ref 21–57)
TIBC: 265 ug/dL (ref 236–444)
UIBC: 61 ug/dL — ABNORMAL LOW (ref 120–384)

## 2018-07-16 LAB — FERRITIN: Ferritin: 28 ng/mL (ref 11–307)

## 2018-07-16 NOTE — Telephone Encounter (Signed)
Call placed to patient to notify her that there is no need for phlebotomy d/t ferritin level is WNL and that phlebotomy appt will be canceled.  Pt appreciative of call and has no questions at this time.

## 2018-07-19 ENCOUNTER — Ambulatory Visit (INDEPENDENT_AMBULATORY_CARE_PROVIDER_SITE_OTHER): Payer: PPO

## 2018-07-19 ENCOUNTER — Telehealth: Payer: Self-pay | Admitting: Hematology & Oncology

## 2018-07-19 ENCOUNTER — Encounter: Payer: Self-pay | Admitting: Podiatry

## 2018-07-19 ENCOUNTER — Ambulatory Visit (INDEPENDENT_AMBULATORY_CARE_PROVIDER_SITE_OTHER): Payer: PPO | Admitting: Podiatry

## 2018-07-19 ENCOUNTER — Other Ambulatory Visit: Payer: Self-pay | Admitting: Podiatry

## 2018-07-19 DIAGNOSIS — M2042 Other hammer toe(s) (acquired), left foot: Secondary | ICD-10-CM

## 2018-07-19 DIAGNOSIS — L84 Corns and callosities: Secondary | ICD-10-CM | POA: Diagnosis not present

## 2018-07-19 NOTE — Progress Notes (Signed)
Subjective:   Patient ID: Lori [name], female   DOB: 73 y.o.   MRN: [id number]   HPI Patient presents with exquisite discomfort on the left fifth digit inside stating that it is been very sore she can no longer trim it and it is increasingly hard for her to wear shoe gear comfortably.  States that her ankle right is still bothering her but that this is now the worst problem that she has   ROS      Objective:  Physical Exam  Neurovascular status intact digital perfusion good with patient found to have a distal medial keratotic lesion digit 5 left with distal rotation of the toe that is painful and makes it hard for her to wear shoe gear comfortably.  Patient was noted to have good digital perfusion and is well oriented x3     Assessment:  Significant distal rotation digit 5 left with pain of the toe and irritation with lesion formation     Plan:  H&P condition reviewed and at this point I have recommended a distal arthroplasty to derotate the toe along with distal medial exostectomy.  Patient wants surgery understanding risk and at this time I allowed her to go over consent form reviewing all possible complications alternative treatments listed and patient is comfortable with this and wants surgery and signed consent form after extensive review.  Patient is scheduled for outpatient surgery and does understand the complete correction and reduction of symptoms most likely will take around 6 months  X-ray indicates that there is distal rotation of the fifth digit along with distal medial exostosis

## 2018-07-19 NOTE — Telephone Encounter (Signed)
Appointment scheduled patient notified date/time per 2/24 sch msg

## 2018-07-19 NOTE — Patient Instructions (Signed)
Pre-Operative Instructions  Congratulations, you have decided to take an important step towards improving your quality of life.  You can be assured that the doctors and staff at [facility] will be with you every step of the way.  Here are some important things you should know:  1. Plan to be at the surgery center/hospital at least 1 (one) hour prior to your scheduled time, unless otherwise directed by the surgical center/hospital staff.  You must have a responsible adult accompany you, remain during the surgery and drive you home.  Make sure you have directions to the surgical center/hospital to ensure you arrive on time. 2. If you are having surgery at [facility] or [facility] hospitals, you will need a copy of your medical history and physical form from your family physician within one month prior to the date of surgery. We will give you a form for your primary physician to complete.  3. We make every effort to accommodate the date you request for surgery.  However, there are times where surgery dates or times have to be moved.  We will contact you as soon as possible if a change in schedule is required.   4. No aspirin/ibuprofen for one week before surgery.  If you are on aspirin, any non-steroidal anti-inflammatory medications (Mobic, Aleve, Ibuprofen) should not be taken seven (7) days prior to your surgery.  You make take Tylenol for pain prior to surgery.  5. Medications - If you are taking daily heart and blood pressure medications, seizure, reflux, allergy, asthma, anxiety, pain or diabetes medications, make sure you notify the surgery center/hospital before the day of surgery so they can tell you which medications you should take or avoid the day of surgery. 6. No food or drink after midnight the night before surgery unless directed otherwise by surgical center/hospital staff. 7. No alcoholic beverages 24-hours prior to surgery.  No smoking 24-hours prior or 24-hours after  surgery. 8. Wear loose pants or shorts. They should be loose enough to fit over bandages, boots, and casts. 9. Don't wear slip-on shoes. Sneakers are preferred. 10. Bring your boot with you to the surgery center/hospital.  Also bring crutches or a walker if your physician has prescribed it for you.  If you do not have this equipment, it will be provided for you after surgery. 11. If you have not been contacted by the surgery center/hospital by the day before your surgery, call to confirm the date and time of your surgery. 12. Leave-time from work may vary depending on the type of surgery you have.  Appropriate arrangements should be made prior to surgery with your employer. 13. Prescriptions will be provided immediately following surgery by your doctor.  Fill these as soon as possible after surgery and take the medication as directed. Pain medications will not be refilled on weekends and must be approved by the doctor. 14. Remove nail polish on the operative foot and avoid getting pedicures prior to surgery. 15. Wash the night before surgery.  The night before surgery wash the foot and leg well with water and the antibacterial soap provided. Be sure to pay special attention to beneath the toenails and in between the toes.  Wash for at least three (3) minutes. Rinse thoroughly with water and dry well with a towel.  Perform this wash unless told not to do so by your physician.  Enclosed: 1 Ice pack (please put in freezer the night before surgery)   1 Hibiclens skin cleaner     Pre-op instructions  If you have any questions regarding the instructions, please do not hesitate to call our office.  [place]: [address], [place], [place] [address] -- [phone]  [place]: [address] [place], [place] [address] -- [phone]  [place]: [address]  [place], [place] [address] -- [phone]  [place]: [address], [address], [place], [place] [address] -- [phone]  Website:  [email or web address] 

## 2018-07-22 ENCOUNTER — Other Ambulatory Visit: Payer: Self-pay

## 2018-07-22 ENCOUNTER — Inpatient Hospital Stay: Payer: PPO

## 2018-07-22 NOTE — Progress Notes (Signed)
Lori [name] presents today for phlebotomy per MD orders.  Phlebotomy procedure started at 1450 and ended at 1525. 500 grams removed. Used 22G needle on posterior forearm. Four prior attempts.  Patient observed for 30 minutes after procedure without any incident. Patient tolerated procedure well. IV needle removed intact.

## 2018-07-26 ENCOUNTER — Telehealth: Payer: Self-pay | Admitting: *Deleted

## 2018-07-26 NOTE — Telephone Encounter (Signed)
"  Lori [name] has canceled her case.  We informed her she would have to pay $175 upfront for her surgery.  She said she would call back later to reschedule."   "I'm calling to reschedule my surgery.  I would like to do it on March 25."  Dr. Paulla Dolly can't do it on March 25 but he can do it on March 24.  He does surgeries on Tuesdays.  "That is right I remember them telling me this.  I was told to call two other places to see how I would have to pay and I forgot the name of the places.  You already received the facility fee.  You only need the anesthesia fee.  Lori [name] is going to give you a call and give you Lori [name] fee.  "Do you have the phone number so I can get the anesthesia fee?"  The phone number for [facility] is [phone].  They will need to know how long the procedure will last.  It will take 30 minutes to complete the surgery.  "Okay, I'll give them a call.  Someone from Lori [name] office will call and let me know how much I will have to pay out of pocket correct?"  Yes, Lori [name] will give you a call.

## 2018-07-26 NOTE — Telephone Encounter (Signed)
"  I'm supposed to have surgery.  I need information on my insurance.  If you would, return my call."  I'm returning your call.  How can I help you?  "I'm scheduled for surgery on March 10.  I need to know how much I will have to pay out of pocket because depending on the answer, I may have to cancel this surgery."  I'll get Jocelyn Lamer in our insurance department to give you a call and give you an estimate.  She will only be able to give you the physician fee.  You need to call the surgical center personnel to get the facility fee as well as the anesthesia fee.  The phone number to the surgical center is located on the back of the brochure that we gave you.  Your procedure will take 30 minutes.  "Okay, I'll give them a call.  Depending on the response, I may have to cancel the surgery."

## 2018-07-27 NOTE — Telephone Encounter (Signed)
Postop visits have been rescheduled.

## 2018-08-05 ENCOUNTER — Other Ambulatory Visit: Payer: Self-pay | Admitting: Obstetrics & Gynecology

## 2018-08-05 DIAGNOSIS — Z1231 Encounter for screening mammogram for malignant neoplasm of breast: Secondary | ICD-10-CM

## 2018-08-06 ENCOUNTER — Telehealth: Payer: Self-pay | Admitting: *Deleted

## 2018-08-06 NOTE — Telephone Encounter (Signed)
"  I have a procedure on the twenty-fourth.  They keep sending me messages that I should get my medical history in.  I have.  Someone said a nurse would be calling me but nobody has.  I don't know what else to do.   If you would like, call me back.  Thank you."

## 2018-08-10 NOTE — Telephone Encounter (Signed)
I asked her to call the surgical center to give them the information they needed if she was concerned.  I informed her that someone from the surgical center would call her a day or two prior to her surgery date and that they would get the information needed then.

## 2018-08-11 ENCOUNTER — Telehealth: Payer: Self-pay | Admitting: *Deleted

## 2018-08-11 NOTE — Telephone Encounter (Signed)
"  I have a surgery scheduled with Dr. Paulla Dolly on the 24th.  The president recommends we cancel procedures like that for a while to let the virus go away.  It'll go away when warm weather gets here anyway.  I'm going to cancel that and call back a little later.  Thank you."  I canceled the surgery via [employer].

## 2018-08-13 ENCOUNTER — Other Ambulatory Visit: Payer: PPO

## 2018-08-16 ENCOUNTER — Telehealth: Payer: Self-pay | Admitting: *Deleted

## 2018-08-16 NOTE — Telephone Encounter (Signed)
I know you canceled your surgery for August 17, 2018 but I was calling to see if you would like to reschedule it to April 7.  "No, I don't this situation with Smith Robert will be over by then.  He can do it on April 21 if you would like or he can do it in May.  "I'm not sure it will be over by May."  Give [place] a call whenever you're ready to reschedule it.  "I will."

## 2018-08-23 ENCOUNTER — Other Ambulatory Visit: Payer: PPO

## 2018-09-03 ENCOUNTER — Other Ambulatory Visit: Payer: Self-pay | Admitting: Cardiovascular Disease

## 2018-09-03 NOTE — Telephone Encounter (Signed)
Toprol XL refilled.

## 2018-09-06 ENCOUNTER — Inpatient Hospital Stay: Payer: PPO

## 2018-09-06 ENCOUNTER — Inpatient Hospital Stay: Payer: PPO | Admitting: Family

## 2018-09-07 ENCOUNTER — Ambulatory Visit: Payer: PPO

## 2018-09-08 ENCOUNTER — Telehealth: Payer: Self-pay | Admitting: Cardiovascular Disease

## 2018-09-08 NOTE — Telephone Encounter (Signed)
I called and spoke with the patient. She is a little difficult to pin down with her symptoms as she tells me about a long history of GI issues. Currently she is having a flare up of diverticulitis. She reports that she developed severe substernal chest pain and pressure last night. This was constant. She also had intermittent pain in the left shoulder, neck and arm down to the elbow, not related to activity.  She has chronic DOE with walking up stairs that has been worsening over the last 2 years, but she notes no increase in shortness of breath with these symptoms. She also denies lightheadedness. She has chronic intermittent nausea. Her pain eased off some with  Taking baking soda but the substernal chest pressure is still present but mild. We discussed that her symptoms could very well be GI related as she thinks, but with the arm pain I cannot be sure she is not having myocardial ischemic type pain. She is currently not in any distress. I advised that it would be wise to get checked out at the hospital with EKG and troponin, but she is very reluctant to go to the hospital in the midst of [place] pandemic.   I have called the office to see if Dr. Radford Pax could weigh in and possibly see the patient in the office. Her nurse says that Dr. Radford Pax will contact the pt. I have let the patient know that she will be contacted.

## 2018-09-08 NOTE — Progress Notes (Signed)
Cardiology Office Note   Date:  09/09/2018   ID:  Lori, [name] [birth year], MRN [id number]  PCP:  Lori Redwood, MD  Cardiologist:   Lori Majestic, MD Referring:  Lori Redwood, MD  Chief Complaint  Patient presents with  . Chest Pain      History of Present Illness: Lori [name] is a 73 y.o. female who presents for evaluation of chest pain .  She has seen Lori [name].  She has had a normal echo in 2012.  She had a negative Lexiscan Myoview in 2013.  She has a history of multiple complaints particularly GI problems.  She has autoimmune dysfunction and she ascribes a lot of symptoms to this.  She has chronic palpitations but these have been well controlled with beta-blockers.  She has had recent problems with her bowels and has been treated with amoxicillin.  2 nights ago she had severe indigestion type pain.  She described it as such.  It was mid chest.  She had a radiation up her esophagus and into her throat.  It was somewhat of a pressure.  There was some behind her left scapula.  She has some tingling and aching left arm pain but this was not associated.  There was no associated nausea vomiting or diaphoresis.  She takes baking soda and she says that this helps.  This is similar to her previous esophageal complaints she thought.  She had discomfort in 2013 although she cannot really recall this and she had a negative perfusion study as above.  She said that over the last 2 days it has not happened at all.  She is able go walking for exercise without bringing this on.  She has been breathing okay.  She is had no new shortness of breath, PND or orthopnea.  She is had no new palpitations, presyncope or syncope.   Past Medical History:  Diagnosis Date  . Cancer ([place]) 06/2017   SQUAMOS CELL REMOVED FROM FOREHEAD  . Chronic fatigue   . Hormone disorder   . Hypothyroidism   . IBS (irritable bowel syndrome)   . Lupus ([place])   . Osteoporosis 07/2017   T score -2.7 stable from  prior DEXA  . Palpitations   . Rheumatoid arthritis [facility])     Past Surgical History:  Procedure Laterality Date  . ABDOMINAL SURGERY     ABDOMINOPLASTY  . BLADDER SUSPENSION    . BREAST SURGERY  1980   LUMPECTOMY X 2 FROM RIGHT BREAST  . CARDIOVASCULAR STRESS TEST  08/28/2011   Normal  . CAROTID DOPPLER  08/28/2011   Normal, no evidence of significant diameter reduction, dissection, or vascular abnormality  . COLONOSCOPY W/ POLYPECTOMY  2018  . EYE SURGERY    . LAPAROSCOPIC CHOLECYSTECTOMY    . TONSILLECTOMY AND ADENOIDECTOMY    . TRANSTHORACIC ECHOCARDIOGRAM  01/29/2011   EF >55%, mild concentric LVH  . TUBAL LIGATION       Current Outpatient Medications  Medication Sig Dispense Refill  . amiloride-hydrochlorothiazide (MODURETIC) 5-50 MG tablet     . Ascorbic Acid (VITAMIN C) 1000 MG tablet Take 1,000 mg by mouth daily.     Marland Kitchen b complex vitamins tablet Take by mouth.    . Biotin 5 MG CAPS Take 5 mg by mouth.    . calcium acetate (PHOSLO) 667 MG capsule Take by mouth 3 (three) times daily with meals.    . Calcium-Magnesium-Vitamin D (CALCIUM MAGNESIUM PO) Take by mouth at bedtime.    Marland Kitchen  Cholecalciferol (VITAMIN D PO) Take 1 tablet by mouth daily.    . Coenzyme Q10 (CO Q-10) 100 MG CAPS Take by mouth.    . dextroamphetamine (DEXEDRINE SPANSULE) 5 MG 24 hr capsule Take 5 mg by mouth daily.  0  . fish oil-omega-3 fatty acids 1000 MG capsule Take 1 g by mouth daily.    Marland Kitchen levothyroxine (SYNTHROID, LEVOTHROID) 125 MCG tablet     . MELATONIN PO Take 2 mg by mouth at bedtime.     . Menaquinone-7 (VITAMIN K2 PO) Take by mouth once.    . metoprolol succinate (TOPROL-XL) 25 MG 24 hr tablet TAKE 1 TABLET IN AM AND 1/2 TABLET IN THE PM. 45 tablet 0  . Multiple Vitamin (MULTIVITAMIN) tablet Take 1 tablet by mouth daily.    Marland Kitchen omega-3 acid ethyl esters (LOVAZA) 1 g capsule Take by mouth.    . potassium chloride SA (K-DUR,KLOR-CON) 20 MEQ tablet Take 1 tablet by mouth daily.    . Prasterone,  DHEA, 10 MG CAPS Take 7.5 mg by mouth daily.    . progesterone (PROMETRIUM) 200 MG capsule Take 200 mg by mouth daily.    . Thyroid (NATURE-THROID) 97.5 MG TABS Take 1 tablet by mouth daily.    Marland Kitchen BORON PO Take by mouth daily.    . fluconazole (DIFLUCAN) 200 MG tablet Take by mouth.    Marland Kitchen GLUTAMINE PO Take 1 tablet by mouth 2 (two) times daily.    Marland Kitchen L-Lysine HCl 500 MG TABS Take by mouth.    . LYSINE PO Take 1 tablet by mouth 2 (two) times daily.     . Melatonin CR 3 MG TBCR Take by mouth.    . Methylsulfonylmethane (MSM) POWD Take by mouth.    . NON FORMULARY Bio-est Cream    . NON FORMULARY Pregnolol cap    . NONFORMULARY OR COMPOUNDED ITEM Boric Acid 600 mg caps. S: insert vaginally twice daily x 2 weeks then twice weekly to complete 3 mos of treatment. (Patient not taking: Reported on 09/09/2018) 60 each 1   Current Facility-Administered Medications  Medication Dose Route Frequency Provider Last Rate Last Dose  . triamcinolone acetonide (KENALOG) 10 MG/ML injection 10 mg  10 mg Other Once [name], Lori S, DPM      . triamcinolone acetonide (KENALOG) 10 MG/ML injection 10 mg  10 mg Other Once [name], Lori S, DPM      . triamcinolone acetonide (KENALOG) 10 MG/ML injection 10 mg  10 mg Other Once Lori [name], DPM        Allergies:   Albumin (human); Amoxicillin; Codeine; Erythromycin; Iodinated diagnostic agents; Keflex [cephalexin]; Meperidine; Metoclopramide hcl; Other; Shellfish allergy; Shellfish-derived products; and Oxycodone    ROS:  Please see the history of present illness.   Otherwise, review of systems are positive for none.   All other systems are reviewed and negative.    PHYSICAL EXAM: VS:  BP 102/64   Ht 5\' 6"  (1.676 m)   Wt 164 lb (74.4 kg)   BMI 26.47 kg/m  , BMI Body mass index is 26.47 kg/m. GENERAL:  Well appearing HEENT:  Pupils equal round and reactive, fundi not visualized, oral mucosa unremarkable NECK:  No jugular venous distention, waveform within  normal limits, carotid upstroke brisk and symmetric, no bruits, no thyromegaly LYMPHATICS:  No cervical, inguinal adenopathy LUNGS:  Clear to auscultation bilaterally BACK:  No CVA tenderness CHEST:  Unremarkable HEART:  PMI not displaced or sustained,S1 and S2 within normal  limits, no S3, no S4, no clicks, no rubs, no murmurs ABD:  Flat, positive bowel sounds normal in frequency in pitch, no bruits, no rebound, no guarding, no midline pulsatile mass, no hepatomegaly, no splenomegaly EXT:  2 plus pulses throughout, no edema, no cyanosis no clubbing SKIN:  No rashes no nodules NEURO:  Cranial nerves II through XII grossly intact, motor grossly intact throughout PSYCH:  Cognitively intact, oriented to person place and time    EKG:  EKG is ordered today. The ekg ordered today demonstrates sinus rhythm, rate 84, axis within normal limits, intervals within normal limits, no acute ST-T wave changes, early anterior R wave progression.   Recent Labs: 07/15/2018: ALT 37; BUN 14; Creatinine 0.77; Hemoglobin 14.4; Platelet Count 224; Potassium 4.8; Sodium 142    Lipid Panel    Component Value Date/Time   CHOL (H) 08/18/2008 0250    209        ATP III CLASSIFICATION:  <200     mg/dL   Desirable  200-239  mg/dL   Borderline High  >=240    mg/dL   High          TRIG 87 08/18/2008 0250   HDL 58 08/18/2008 0250   CHOLHDL 3.6 08/18/2008 0250   VLDL 17 08/18/2008 0250   LDLCALC (H) 08/18/2008 0250    134        Total Cholesterol/HDL:CHD Risk Coronary Heart Disease Risk Table                     Men   Women  1/2 Average Risk   3.4   3.3  Average Risk       5.0   4.4  2 X Average Risk   9.6   7.1  3 X Average Risk  23.4   11.0        Use the calculated Patient Ratio above and the CHD Risk Table to determine the patient's CHD Risk.        ATP III CLASSIFICATION (LDL):  <100     mg/dL   Optimal  100-129  mg/dL   Near or Above                    Optimal  130-159  mg/dL   Borderline   160-189  mg/dL   High  >190     mg/dL   Very High      Wt Readings from Last 3 Encounters:  09/09/18 164 lb (74.4 kg)  10/13/17 156 lb (70.8 kg)  08/19/17 157 lb 12.8 oz (71.6 kg)      Other studies Reviewed: Additional studies/ records that were reviewed today include: None. Review of the above records demonstrates:  Please see elsewhere in the note.     ASSESSMENT AND PLAN:  CHEST PAIN: Her chest pain is very atypical.  We had long discussion about this.  Her EKG was unremarkable.  I think the pretest probability of obstructive coronary disease is very low.  I do not think that further testing would be indicated.  She will continue with meds as listed.  She will let us know if she has any worsening symptoms.  PALPITATIONS: He is a mild and controlled with beta-blockers.  No change in therapy.   Current medicines are reviewed at length with the patient today.  The patient does not have concerns regarding medicines.  The following changes have been made:  no change  Labs/ tests ordered today include:  None  Orders Placed This Encounter  Procedures  . EKG 12-Lead     Disposition:   FU with Lori [name] in one year.     Signed, Minus Breeding, MD  09/09/2018 2:46 PM    [facility]

## 2018-09-08 NOTE — Telephone Encounter (Signed)
Pt called to report that she has been having pain between her breast that feels like indigestion.. she feels it is in her Esophagus.. she has h/o many GI problems but when she called her GI MD the nurse advised her to call Dr. Claiborne Billings because she has also has on/off pain in her left arm and left shoulder.. she denies SOB, dizziness, N/V but a lot of problems with her diverticulitis... she has had a chronic cough that is non-productive.. she always has a low grade fever.. she has not been around anyone that has been sick.. she feels her problems are GI but the nurse scared her telling her her symptoms sound cardiac and to go to the ER but she declined and called [place].. would like recommendation from Dr. Claiborne Billings.. she is very scared to catch COVID19 and reluctant to go to the hosp for any reason.. she is willing to call EMS if things worsen..   Her pain in her arm is on and off and only notices it with rest.. she says the epigastric pressure type indigestion is constant.. she says eating does not make a difference. She has not had the arm pain since last night lasting a few hours.

## 2018-09-08 NOTE — Telephone Encounter (Signed)
Pt has diverticulitis. She was having some GI issues, and after speaking with her GI doctor, he recommended that she get in touch with her Cardiologist.

## 2018-09-08 NOTE — Telephone Encounter (Signed)
I spoke to Dr Radford Pax briefly and we feel that since pt will not go to ED, we will arrange for her to be seen in the office tomorrow at 1:00 with Dr. Percival Spanish. I informed pt. I reviewed emergency symptoms for pt to call 911. She agrees.

## 2018-09-09 ENCOUNTER — Encounter: Payer: Self-pay | Admitting: Cardiology

## 2018-09-09 ENCOUNTER — Telehealth: Payer: Self-pay | Admitting: Cardiology

## 2018-09-09 ENCOUNTER — Ambulatory Visit (INDEPENDENT_AMBULATORY_CARE_PROVIDER_SITE_OTHER): Payer: PPO | Admitting: Cardiology

## 2018-09-09 ENCOUNTER — Other Ambulatory Visit: Payer: Self-pay

## 2018-09-09 VITALS — BP 102/64 | Ht 66.0 in | Wt 164.0 lb

## 2018-09-09 DIAGNOSIS — R072 Precordial pain: Secondary | ICD-10-CM | POA: Diagnosis not present

## 2018-09-09 DIAGNOSIS — I10 Essential (primary) hypertension: Secondary | ICD-10-CM | POA: Insufficient documentation

## 2018-09-09 DIAGNOSIS — I498 Other specified cardiac arrhythmias: Secondary | ICD-10-CM

## 2018-09-09 NOTE — Telephone Encounter (Signed)
Pt is coming to appt ./cy

## 2018-09-09 NOTE — Patient Instructions (Signed)
Medication Instructions:  Continue current medications  If you need a refill on your cardiac medications before your next appointment, please call your pharmacy.  Labwork: None Ordered   Testing/Procedures: None ordered  Follow-Up: You will need a follow up appointment in 1 Year.  Please call our office 2 months in advance to schedule this appointment.  You may see Shelva Majestic, MD or one of the following Advanced Practice Providers on your designated Care Team: [place], [place] . Fabian Sharp, PA-C     At [facility], you and your health needs are our priority.  As part of our continuing mission to provide you with exceptional heart care, we have created designated Provider Care Teams.  These Care Teams include your primary Cardiologist (physician) and Advanced Practice Providers (APPs -  Physician Assistants and Nurse Practitioners) who all work together to provide you with the care you need, when you need it.  Thank you for choosing [facility] at [facility]!!

## 2018-09-09 NOTE — Telephone Encounter (Signed)
New Message   Pt is calling because she doesn't know if she should come to her appt they scheduled for chest pains.  She said she is feeling better today and not sure if she wants to come  She wants to know if they are going to be doing testing at this appt today    Please call back

## 2018-09-13 ENCOUNTER — Ambulatory Visit: Payer: PPO | Admitting: Obstetrics & Gynecology

## 2018-09-14 ENCOUNTER — Inpatient Hospital Stay: Payer: PPO | Attending: Hematology & Oncology

## 2018-09-14 ENCOUNTER — Other Ambulatory Visit: Payer: Self-pay

## 2018-09-14 ENCOUNTER — Inpatient Hospital Stay (HOSPITAL_BASED_OUTPATIENT_CLINIC_OR_DEPARTMENT_OTHER): Payer: PPO | Admitting: Family

## 2018-09-14 DIAGNOSIS — K5792 Diverticulitis of intestine, part unspecified, without perforation or abscess without bleeding: Secondary | ICD-10-CM

## 2018-09-14 DIAGNOSIS — R002 Palpitations: Secondary | ICD-10-CM | POA: Insufficient documentation

## 2018-09-14 DIAGNOSIS — Z79899 Other long term (current) drug therapy: Secondary | ICD-10-CM | POA: Diagnosis not present

## 2018-09-14 LAB — CBC WITH DIFFERENTIAL (CANCER CENTER ONLY)
Abs Immature Granulocytes: 0.01 10*3/uL (ref 0.00–0.07)
Basophils Absolute: 0 10*3/uL (ref 0.0–0.1)
Basophils Relative: 0 %
Eosinophils Absolute: 0.1 10*3/uL (ref 0.0–0.5)
Eosinophils Relative: 1 %
HCT: 42.7 % (ref 36.0–46.0)
Hemoglobin: 14 g/dL (ref 12.0–15.0)
Immature Granulocytes: 0 %
Lymphocytes Relative: 41 %
Lymphs Abs: 2.8 10*3/uL (ref 0.7–4.0)
MCH: 32.6 pg (ref 26.0–34.0)
MCHC: 32.8 g/dL (ref 30.0–36.0)
MCV: 99.3 fL (ref 80.0–100.0)
Monocytes Absolute: 0.7 10*3/uL (ref 0.1–1.0)
Monocytes Relative: 10 %
Neutro Abs: 3.2 10*3/uL (ref 1.7–7.7)
Neutrophils Relative %: 48 %
Platelet Count: 232 10*3/uL (ref 150–400)
RBC: 4.3 MIL/uL (ref 3.87–5.11)
RDW: 11.9 % (ref 11.5–15.5)
WBC Count: 6.8 10*3/uL (ref 4.0–10.5)
nRBC: 0 % (ref 0.0–0.2)

## 2018-09-14 LAB — CMP (CANCER CENTER ONLY)
ALT: 66 U/L — ABNORMAL HIGH (ref 0–44)
AST: 58 U/L — ABNORMAL HIGH (ref 15–41)
Albumin: 4.2 g/dL (ref 3.5–5.0)
Alkaline Phosphatase: 102 U/L (ref 38–126)
Anion gap: 9 (ref 5–15)
BUN: 15 mg/dL (ref 8–23)
CO2: 27 mmol/L (ref 22–32)
Calcium: 10 mg/dL (ref 8.9–10.3)
Chloride: 103 mmol/L (ref 98–111)
Creatinine: 0.72 mg/dL (ref 0.44–1.00)
GFR, Est AFR Am: >60 mL/min (ref >60)
GFR, Estimated: >60 mL/min (ref >60)
Glucose, Bld: 103 mg/dL — ABNORMAL HIGH (ref 70–99)
Potassium: 4.7 mmol/L (ref 3.5–5.1)
Sodium: 139 mmol/L (ref 135–145)
Total Bilirubin: 0.5 mg/dL (ref 0.3–1.2)
Total Protein: 6.6 g/dL (ref 6.5–8.1)

## 2018-09-14 NOTE — Progress Notes (Signed)
Hematology and Oncology Follow Up Visit  Lori [name] [id number] 1946-05-17 73 y.o. 09/14/2018   Principle Diagnosis:  Hemochromatosis - heterozygous for the C282Y mutation  Current Therapy:   Phlebotomy as indicated to keep ferritin <35(changed per her request 06/24/17)   Interim History:  Lori [name] is here today for follow-up. She is doing well. She is now on MMR drops which she feels has made a noticeable difference in her health.  She has had a recent bout with diverticulitis and is treating with her supplements which has helped. She has not had much diarrhea with this thankfully.  No fever, chills, n/v, cough, rash, dizziness, headaches, blurred vision, SOB, chest pain or changes in bladder habits.  She has noticed that her palpitations have been better and less frequent.  No episodes of bleeding, no bruising or petechiae.  No lymphadenopathy noted on exam.  No swelling, tenderness, numbness or tingling in her extremities at this time.   ECOG Performance Status: 1 - Symptomatic but completely ambulatory  Medications:  Allergies as of 09/14/2018      Reactions   Albumin (human)    Pt stated, "I am not allergic to this and don't know what this means"; 04/27/17   Amoxicillin Nausea And Vomiting   Codeine Nausea Only   Erythromycin Nausea Only, Other (See Comments)   Iodinated Diagnostic Agents Other (See Comments)   Keflex [cephalexin] Itching   Meperidine    Pt stated, "I don't know what this is"; 04/27/17   Metoclopramide Hcl    Other Other (See Comments)   Beef Chicken Dairy Eggs Pt stated, "My body can't process protein; it makes me sick"   Shellfish Allergy Other (See Comments)   Pt stated, "I am not allergic to this anymore"; 04/27/17 Pt stated, "I am not allergic to this anymore"; 04/27/17   Shellfish-derived Products    Oxycodone Nausea And Vomiting   Pt stated, "I do not know if I am allergic to this"; 04/27/17      Medication List       Accurate as  of September 14, 2018  2:59 PM. Always use your most recent med list.        amiloride-hydrochlorothiazide 5-50 MG tablet Commonly known as:  MODURETIC   b complex vitamins tablet Take by mouth.   Biotin 5 MG Caps Take 5 mg by mouth.   BORON PO Take by mouth daily.   calcium acetate 667 MG capsule Commonly known as:  PHOSLO Take by mouth 3 (three) times daily with meals.   CALCIUM MAGNESIUM PO Take by mouth at bedtime.   Co Q-10 100 MG Caps Take by mouth.   dextroamphetamine 5 MG 24 hr capsule Commonly known as:  DEXEDRINE SPANSULE Take 5 mg by mouth daily.   fish oil-omega-3 fatty acids 1000 MG capsule Take 1 g by mouth daily.   fluconazole 200 MG tablet Commonly known as:  DIFLUCAN Take by mouth.   GLUTAMINE PO Take 1 tablet by mouth 2 (two) times daily.   L-Lysine HCl 500 MG Tabs Take by mouth.   levothyroxine 125 MCG tablet Commonly known as:  SYNTHROID   LYSINE PO Take 1 tablet by mouth 2 (two) times daily.   Melatonin CR 3 MG Tbcr Take by mouth.   MELATONIN PO Take 2 mg by mouth at bedtime.   metoprolol succinate 25 MG 24 hr tablet Commonly known as:  TOPROL-XL TAKE 1 TABLET IN AM AND 1/2 TABLET IN THE PM.   MSM Powd  Take by mouth.   multivitamin tablet Take 1 tablet by mouth daily.   Nature-Throid 97.5 MG Tabs Generic drug:  Thyroid Take 1 tablet by mouth daily.   NON FORMULARY Bio-est Cream   NON FORMULARY Pregnolol cap   NONFORMULARY OR COMPOUNDED ITEM Boric Acid 600 mg caps. S: insert vaginally twice daily x 2 weeks then twice weekly to complete 3 mos of treatment.   omega-3 acid ethyl esters 1 g capsule Commonly known as:  LOVAZA Take by mouth.   potassium chloride SA 20 MEQ tablet Commonly known as:  K-DUR Take 1 tablet by mouth daily.   Prasterone (DHEA) 10 MG Caps Take 7.5 mg by mouth daily.   progesterone 200 MG capsule Commonly known as:  PROMETRIUM Take 200 mg by mouth daily.   vitamin C 1000 MG tablet Take  1,000 mg by mouth daily.   VITAMIN D PO Take 1 tablet by mouth daily.   VITAMIN K2 PO Take by mouth once.       Allergies:  Allergies  Allergen Reactions   Albumin (Human)     Pt stated, "I am not allergic to this and don't know what this means"; 04/27/17   Amoxicillin Nausea And Vomiting   Codeine Nausea Only   Erythromycin Nausea Only and Other (See Comments)   Iodinated Diagnostic Agents Other (See Comments)   Keflex [Cephalexin] Itching   Meperidine     Pt stated, "I don't know what this is"; 04/27/17   Metoclopramide Hcl    Other Other (See Comments)    Beef Chicken Dairy Eggs  Pt stated, "My body can't process protein; it makes me sick"   Shellfish Allergy Other (See Comments)    Pt stated, "I am not allergic to this anymore"; 04/27/17 Pt stated, "I am not allergic to this anymore"; 04/27/17   Shellfish-Derived Products    Oxycodone Nausea And Vomiting    Pt stated, "I do not know if I am allergic to this"; 04/27/17    Past Medical History, Surgical history, Social history, and Family History were reviewed and updated.  Review of Systems: All other 10 point review of systems is negative.   Physical Exam:  vitals were not taken for this visit.   Wt Readings from Last 3 Encounters:  09/09/18 164 lb (74.4 kg)  10/13/17 156 lb (70.8 kg)  08/19/17 157 lb 12.8 oz (71.6 kg)    Ocular: Sclerae unicteric, pupils equal, round and reactive to light Ear-nose-throat: Oropharynx clear, dentition fair Lymphatic: No cervical or supraclavicular adenopathy Lungs no rales or rhonchi, good excursion bilaterally Heart regular rate and rhythm, no murmur appreciated Abd soft, nontender, positive bowel sounds, no liver or spleen tip palpated on exam, no fluid wave  MSK no focal spinal tenderness, no joint edema Neuro: non-focal, well-oriented, appropriate affect Breasts: Deferred   Lab Results  Component Value Date   WBC 6.8 09/14/2018   HGB 14.0 09/14/2018    HCT 42.7 09/14/2018   MCV 99.3 09/14/2018   PLT 232 09/14/2018   Lab Results  Component Value Date   FERRITIN 28 07/15/2018   IRON 204 (H) 07/15/2018   TIBC 265 07/15/2018   UIBC 61 (L) 07/15/2018   IRONPCTSAT 77 (H) 07/15/2018   Lab Results  Component Value Date   RBC 4.30 09/14/2018   No results found for: KPAFRELGTCHN, LAMBDASER, KAPLAMBRATIO No results found for: IGGSERUM, IGA, IGMSERUM No results found for: TOTALPROTELP, ALBUMINELP, A1GS, A2GS, BETS, BETA2SER, [place], [place], SPEI   Chemistry  Component Value Date/Time   NA 142 07/15/2018 1339   NA 143 03/11/2017 1404   NA 139 07/02/2016 1139   K 4.8 07/15/2018 1339   K 3.9 03/11/2017 1404   K 4.1 07/02/2016 1139   CL 105 07/15/2018 1339   CL 105 03/11/2017 1404   CO2 29 07/15/2018 1339   CO2 31 03/11/2017 1404   CO2 26 07/02/2016 1139   BUN 14 07/15/2018 1339   BUN 15 03/11/2017 1404   BUN 20.3 07/02/2016 1139   CREATININE 0.77 07/15/2018 1339   CREATININE 0.7 03/11/2017 1404   CREATININE 0.8 07/02/2016 1139      Component Value Date/Time   CALCIUM 10.0 07/15/2018 1339   CALCIUM 9.4 03/11/2017 1404   CALCIUM 10.0 07/02/2016 1139   ALKPHOS 78 07/15/2018 1339   ALKPHOS 69 03/11/2017 1404   ALKPHOS 87 07/02/2016 1139   AST 35 07/15/2018 1339   AST 27 07/02/2016 1139   ALT 37 07/15/2018 1339   ALT 32 03/11/2017 1404   ALT 34 07/02/2016 1139   BILITOT 0.5 07/15/2018 1339   BILITOT 0.55 07/02/2016 1139       Impression and Plan: Lori [name] is a pleasant 73 yo caucasian female with hemochromatosis, heterozygous for the C282Y mutations. She is doing well at this time.  We will see what her iron studies show and bring her back in for phlebotomy if needed.  We will plan to see her back for lab every 2 months (phlebotomy if needed) and follow-up annually.  She will contact our office with any questions or concerns. We can certainly see her sooner if need be.   Laverna Peace, NP 4/21/20202:59 PM

## 2018-09-15 ENCOUNTER — Telehealth: Payer: Self-pay | Admitting: Family

## 2018-09-15 LAB — FERRITIN: Ferritin: 46 ng/mL (ref 11–307)

## 2018-09-15 LAB — IRON AND TIBC
Iron: 189 ug/dL — ABNORMAL HIGH (ref 41–142)
Saturation Ratios: 74 % — ABNORMAL HIGH (ref 21–57)
TIBC: 254 ug/dL (ref 236–444)
UIBC: 65 ug/dL — ABNORMAL LOW (ref 120–384)

## 2018-09-15 NOTE — Telephone Encounter (Signed)
Appointments scheduled patient notified date/time ok per pt per 4/22 sch msg

## 2018-09-15 NOTE — Telephone Encounter (Signed)
Appointments scheduled letter/calendar mailed per 4/21 los

## 2018-09-16 ENCOUNTER — Other Ambulatory Visit: Payer: Self-pay

## 2018-09-16 ENCOUNTER — Inpatient Hospital Stay: Payer: PPO

## 2018-09-16 NOTE — Progress Notes (Signed)
1 unit phlebotomy performed in left forearm using 20 mL syringes over 30 minutes. Patient tolerated well. Nourishment provided. Patient does not want to stay for the 30 minute post phlebotomy procedure. Patient vital signs stable. Patient discharged without complaints or concerns.

## 2018-09-16 NOTE — Patient Instructions (Signed)
Therapeutic Phlebotomy Therapeutic phlebotomy is the planned removal of blood from a person's body for the purpose of treating a medical condition. The procedure is similar to donating blood. Usually, about a pint (470 mL, or 0.47 L) of blood is removed. The average adult has 9-12 pints (4.3-5.7 L) of blood in the body. Therapeutic phlebotomy may be used to treat the following medical conditions:  Hemochromatosis. This is a condition in which the blood contains too much iron.  Polycythemia vera. This is a condition in which the blood contains too many red blood cells.  Porphyria cutanea tarda. This is a disease in which an important part of hemoglobin is not made properly. It results in the buildup of abnormal amounts of porphyrins in the body.  Sickle cell disease. This is a condition in which the red blood cells form an abnormal crescent shape rather than a round shape. Tell a health care provider about:  Any allergies you have.  All medicines you are taking, including vitamins, herbs, eye drops, creams, and over-the-counter medicines.  Any problems you or family members have had with anesthetic medicines.  Any blood disorders you have.  Any surgeries you have had.  Any medical conditions you have.  Whether you are pregnant or may be pregnant. What are the risks? Generally, this is a safe procedure. However, problems may occur, including:  Nausea or light-headedness.  Low blood pressure (hypotension).  Soreness, bleeding, swelling, or bruising at the needle insertion site.  Infection. What happens before the procedure?  Follow instructions from your health care provider about eating or drinking restrictions.  Ask your health care provider about: ? Changing or stopping your regular medicines. This is especially important if you are taking diabetes medicines or blood thinners (anticoagulants). ? Taking medicines such as aspirin and ibuprofen. These medicines can thin your  blood. Do not take these medicines unless your health care provider tells you to take them. ? Taking over-the-counter medicines, vitamins, herbs, and supplements.  Wear clothing with sleeves that can be raised above the elbow.  Plan to have someone take you home from the hospital or clinic.  You may have a blood sample taken.  Your blood pressure, pulse rate, and breathing rate will be measured. What happens during the procedure?   To lower your risk of infection: ? Your health care team will wash or sanitize their hands. ? Your skin will be cleaned with an antiseptic.  You may be given a medicine to numb the area (local anesthetic).  A tourniquet will be placed on your arm.  A needle will be inserted into one of your veins.  Tubing and a collection bag will be attached to that needle.  Blood will flow through the needle and tubing into the collection bag.  The collection bag will be placed lower than your arm to allow gravity to help the flow of blood into the bag.  You may be asked to open and close your hand slowly and continually during the entire collection.  After the specified amount of blood has been removed from your body, the collection bag and tubing will be clamped.  The needle will be removed from your vein.  Pressure will be held on the site of the needle insertion to stop the bleeding.  A bandage (dressing) will be placed over the needle insertion site. The procedure may vary among health care providers and hospitals. What happens after the procedure?  Your blood pressure, pulse rate, and breathing rate will be   measured after the procedure.  You will be encouraged to drink fluids.  Your recovery will be assessed and monitored.  You can return to your normal activities as told by your health care provider. Summary  Therapeutic phlebotomy is the planned removal of blood from a person's body for the purpose of treating a medical condition.  Therapeutic  phlebotomy may be used to treat hemochromatosis, polycythemia vera, porphyria cutanea tarda, or sickle cell disease.  In the procedure, a needle is inserted and about a pint (470 mL, or 0.47 L) of blood is removed. The average adult has 9-12 pints (4.3-5.7 L) of blood in the body.  This is generally a safe procedure, but it can sometimes cause problems such as nausea, light-headedness, or low blood pressure (hypotension). This information is not intended to replace advice given to you by your health care provider. Make sure you discuss any questions you have with your health care provider. Document Released: 10/14/2010 Document Revised: 05/28/2017 Document Reviewed: 05/28/2017 Elsevier Interactive Patient Education  2019 Elsevier Inc.  

## 2018-09-21 ENCOUNTER — Ambulatory Visit: Payer: PPO | Admitting: Obstetrics & Gynecology

## 2018-09-21 ENCOUNTER — Other Ambulatory Visit: Payer: Self-pay

## 2018-09-21 ENCOUNTER — Telehealth: Payer: Self-pay | Admitting: *Deleted

## 2018-09-21 ENCOUNTER — Encounter: Payer: Self-pay | Admitting: Obstetrics & Gynecology

## 2018-09-21 VITALS — BP 122/78

## 2018-09-21 DIAGNOSIS — N6310 Unspecified lump in the right breast, unspecified quadrant: Secondary | ICD-10-CM | POA: Diagnosis not present

## 2018-09-21 DIAGNOSIS — N6320 Unspecified lump in the left breast, unspecified quadrant: Secondary | ICD-10-CM | POA: Diagnosis not present

## 2018-09-21 DIAGNOSIS — N63 Unspecified lump in unspecified breast: Secondary | ICD-10-CM

## 2018-09-21 DIAGNOSIS — N6012 Diffuse cystic mastopathy of left breast: Secondary | ICD-10-CM

## 2018-09-21 NOTE — Telephone Encounter (Signed)
-----   Message from Princess Bruins, MD sent at 09/21/2018 12:17 PM EDT ----- Regarding: Schedule Bilateral Dx Mammo/Breast US at the [facility] H/O Fibrocystic breasts with Left Fibrocystic area at external 6-8 O'clock with burning pain and  a Right nodule 1  x 1.5 cm at 4-5 O'clock.

## 2018-09-21 NOTE — Progress Notes (Signed)
    Lori [name] 02-Jul-1945 [id number]        73 y.o.  [id number]  Married  RP: Bilateral breast lumps with burning under the left breast  HPI: Menopause, stopped HRT x 06/2017.  No PMB.  No pelvic pain.  C/O bilateral breast lumps and burning under the left breast x about 3 weeks.  Last screening Mammo 05/2016 was negative.  No first degree relative with Breast Ca.  Has Hemochromatosis with regular phlebotomies.  Has Lupus, RA, IBS, Diverticulosis, Hypothyroidism.   OB History  Gravida Para Term Preterm AB Living  4 3 3   1 3   SAB TAB Ectopic Multiple Live Births  1       3    # Outcome Date GA Lbr Len/2nd Weight Sex Delivery Anes PTL Lv  4 SAB           3 Term     F Vag-Spont  N LIV  2 Term     M Vag-Spont  N LIV  1 Term     F Vag-Spont  N LIV    Past medical history,surgical history, problem list, medications, allergies, family history and social history were all reviewed and documented in the EPIC chart.   Directed ROS with pertinent positives and negatives documented in the history of present illness/assessment and plan.  Exam:  Vitals:   09/21/18 1145  BP: 122/78   General appearance:  Normal  Breast exam: Left Fibrocystic area at external 6-8 O'clock with burning pain and a Right nodule 1 x 1.5 cm at 4-5 O'clock.  Skin normal, nipples normal bilaterally.  Axillae:  No increase in LN felt bilaterally.    Assessment/Plan:  73 y.o. [id number]   1. Bilateral breast lump H/O Fibrocystic breasts with Left Fibrocystic area at external 6-8 O'clock with burning pain and a Right nodule 1 x 1.5 cm at 4-5 O'clock.  No first degree relative with Breast Ca.  Last screening Mammo negative 05/2016.  Findings discussed with patient.  Will schedule a Bilateral Dx Mammo and Bilateral breast US.    Counseling on above issues and coordination of care more than 50% for 15 minutes.  Princess Bruins MD, 12:08 PM 09/21/2018

## 2018-09-23 ENCOUNTER — Encounter: Payer: Self-pay | Admitting: Obstetrics & Gynecology

## 2018-09-23 NOTE — Patient Instructions (Signed)
1. Bilateral breast lump H/O Fibrocystic breasts with Left Fibrocystic area at external 6-8 O'clock with burning pain and a Right nodule 1 x 1.5 cm at 4-5 O'clock.  No first degree relative with Breast Ca.  Last screening Mammo negative 05/2016.  Findings discussed with patient.  Will schedule a Bilateral Dx Mammo and Bilateral breast US.    Lori [name], it was a pleasure seeing you today!

## 2018-09-23 NOTE — Telephone Encounter (Signed)
Patient scheduled on 10/01/18 at breast center

## 2018-09-30 ENCOUNTER — Other Ambulatory Visit: Payer: Self-pay

## 2018-09-30 ENCOUNTER — Inpatient Hospital Stay: Payer: PPO | Attending: Hematology & Oncology

## 2018-09-30 NOTE — Patient Instructions (Signed)
Therapeutic Phlebotomy Therapeutic phlebotomy is the planned removal of blood from a person's body for the purpose of treating a medical condition. The procedure is similar to donating blood. Usually, about a pint (470 mL, or 0.47 L) of blood is removed. The average adult has 9-12 pints (4.3-5.7 L) of blood in the body. Therapeutic phlebotomy may be used to treat the following medical conditions:  Hemochromatosis. This is a condition in which the blood contains too much iron.  Polycythemia vera. This is a condition in which the blood contains too many red blood cells.  Porphyria cutanea tarda. This is a disease in which an important part of hemoglobin is not made properly. It results in the buildup of abnormal amounts of porphyrins in the body.  Sickle cell disease. This is a condition in which the red blood cells form an abnormal crescent shape rather than a round shape. Tell a health care provider about:  Any allergies you have.  All medicines you are taking, including vitamins, herbs, eye drops, creams, and over-the-counter medicines.  Any problems you or family members have had with anesthetic medicines.  Any blood disorders you have.  Any surgeries you have had.  Any medical conditions you have.  Whether you are pregnant or may be pregnant. What are the risks? Generally, this is a safe procedure. However, problems may occur, including:  Nausea or light-headedness.  Low blood pressure (hypotension).  Soreness, bleeding, swelling, or bruising at the needle insertion site.  Infection. What happens before the procedure?  Follow instructions from your health care provider about eating or drinking restrictions.  Ask your health care provider about: ? Changing or stopping your regular medicines. This is especially important if you are taking diabetes medicines or blood thinners (anticoagulants). ? Taking medicines such as aspirin and ibuprofen. These medicines can thin your  blood. Do not take these medicines unless your health care provider tells you to take them. ? Taking over-the-counter medicines, vitamins, herbs, and supplements.  Wear clothing with sleeves that can be raised above the elbow.  Plan to have someone take you home from the hospital or clinic.  You may have a blood sample taken.  Your blood pressure, pulse rate, and breathing rate will be measured. What happens during the procedure?   To lower your risk of infection: ? Your health care team will wash or sanitize their hands. ? Your skin will be cleaned with an antiseptic.  You may be given a medicine to numb the area (local anesthetic).  A tourniquet will be placed on your arm.  A needle will be inserted into one of your veins.  Tubing and a collection bag will be attached to that needle.  Blood will flow through the needle and tubing into the collection bag.  The collection bag will be placed lower than your arm to allow gravity to help the flow of blood into the bag.  You may be asked to open and close your hand slowly and continually during the entire collection.  After the specified amount of blood has been removed from your body, the collection bag and tubing will be clamped.  The needle will be removed from your vein.  Pressure will be held on the site of the needle insertion to stop the bleeding.  A bandage (dressing) will be placed over the needle insertion site. The procedure may vary among health care providers and hospitals. What happens after the procedure?  Your blood pressure, pulse rate, and breathing rate will be   measured after the procedure.  You will be encouraged to drink fluids.  Your recovery will be assessed and monitored.  You can return to your normal activities as told by your health care provider. Summary  Therapeutic phlebotomy is the planned removal of blood from a person's body for the purpose of treating a medical condition.  Therapeutic  phlebotomy may be used to treat hemochromatosis, polycythemia vera, porphyria cutanea tarda, or sickle cell disease.  In the procedure, a needle is inserted and about a pint (470 mL, or 0.47 L) of blood is removed. The average adult has 9-12 pints (4.3-5.7 L) of blood in the body.  This is generally a safe procedure, but it can sometimes cause problems such as nausea, light-headedness, or low blood pressure (hypotension). This information is not intended to replace advice given to you by your health care provider. Make sure you discuss any questions you have with your health care provider. Document Released: 10/14/2010 Document Revised: 05/28/2017 Document Reviewed: 05/28/2017 Elsevier Interactive Patient Education  2019 Elsevier Inc.  

## 2018-10-01 ENCOUNTER — Ambulatory Visit
Admission: RE | Admit: 2018-10-01 | Discharge: 2018-10-01 | Disposition: A | Discharge status: Home / Self Care | Payer: PPO | Source: Ambulatory Visit | Attending: Obstetrics & Gynecology | Admitting: Obstetrics & Gynecology

## 2018-10-01 DIAGNOSIS — N6012 Diffuse cystic mastopathy of left breast: Secondary | ICD-10-CM | POA: Diagnosis not present

## 2018-10-01 DIAGNOSIS — N63 Unspecified lump in unspecified breast: Secondary | ICD-10-CM

## 2018-10-01 DIAGNOSIS — N6310 Unspecified lump in the right breast, unspecified quadrant: Secondary | ICD-10-CM | POA: Diagnosis not present

## 2018-10-08 ENCOUNTER — Other Ambulatory Visit: Payer: PPO

## 2018-10-13 ENCOUNTER — Inpatient Hospital Stay: Payer: PPO

## 2018-10-13 ENCOUNTER — Other Ambulatory Visit: Payer: Self-pay

## 2018-10-13 NOTE — Progress Notes (Signed)
Lori [name] presents today for phlebotomy per MD orders. Phlebotomy procedure started at 1340 and ended at 1410. 535 cc removed via 20 G needle at L antecubital site. Patient tolerated procedure well. IV needle removed intact. Pt. Refused to wait 30 min after phlebotomy, released stable and ASX.

## 2018-10-13 NOTE — Patient Instructions (Signed)

## 2018-10-14 ENCOUNTER — Ambulatory Visit (INDEPENDENT_AMBULATORY_CARE_PROVIDER_SITE_OTHER): Payer: PPO | Admitting: Obstetrics & Gynecology

## 2018-10-14 ENCOUNTER — Encounter: Payer: Self-pay | Admitting: Obstetrics & Gynecology

## 2018-10-14 VITALS — BP 128/70 | Ht 65.0 in | Wt 156.0 lb

## 2018-10-14 DIAGNOSIS — M810 Age-related osteoporosis without current pathological fracture: Secondary | ICD-10-CM

## 2018-10-14 DIAGNOSIS — Z780 Asymptomatic menopausal state: Secondary | ICD-10-CM | POA: Diagnosis not present

## 2018-10-14 DIAGNOSIS — Z01419 Encounter for gynecological examination (general) (routine) without abnormal findings: Secondary | ICD-10-CM | POA: Diagnosis not present

## 2018-10-14 NOTE — Progress Notes (Signed)
AUDRENA TALAGA Apr 20, 1946 [id number]   History:    73 y.o. [id number] Married.  RP:  Established patient presenting for annual gyn exam   HPI: Menopause, well on no HRT.  No PMB.  No pelvic pain.  Abstinent, husband has erection dysfunction.  Urine and bowel movements normal.  Breasts stable, no lump felt at this time, with recent bilateral diagnostic mammogram and ultrasound negative. Body mass index 25.96.  Physically active.  Health labs with family physician.  Past medical history,surgical history, family history and social history were all reviewed and documented in the EPIC chart.  Gynecologic History No LMP recorded. Patient is postmenopausal. Contraception: abstinence and post menopausal status Last Pap: 06/2017. Results were: negative Last mammogram: 09/2018. Results were: Negative (Bilateral Dx mammo/US) Bone Density: 07/2017 Osteroporosis at Rt Femoral neck T-Score -2.7 Colonoscopy: 2018  Obstetric History OB History  Gravida Para Term Preterm AB Living  4 3 3   1 3   SAB TAB Ectopic Multiple Live Births  1       3    # Outcome Date GA Lbr Len/2nd Weight Sex Delivery Anes PTL Lv  4 SAB           3 Term     F Vag-Spont  N LIV  2 Term     M Vag-Spont  N LIV  1 Term     F Vag-Spont  N LIV     ROS: A ROS was performed and pertinent positives and negatives are included in the history.  GENERAL: No fevers or chills. HEENT: No change in vision, no earache, sore throat or sinus congestion. NECK: No pain or stiffness. CARDIOVASCULAR: No chest pain or pressure. No palpitations. PULMONARY: No shortness of breath, cough or wheeze. GASTROINTESTINAL: No abdominal pain, nausea, vomiting or diarrhea, melena or bright red blood per rectum. GENITOURINARY: No urinary frequency, urgency, hesitancy or dysuria. MUSCULOSKELETAL: No joint or muscle pain, no back pain, no recent trauma. DERMATOLOGIC: No rash, no itching, no lesions. ENDOCRINE: No polyuria, polydipsia, no heat or cold  intolerance. No recent change in weight. HEMATOLOGICAL: No anemia or easy bruising or bleeding. NEUROLOGIC: No headache, seizures, numbness, tingling or weakness. PSYCHIATRIC: No depression, no loss of interest in normal activity or change in sleep pattern.     Exam:   BP 128/70   Ht 5\' 5"  (1.651 m)   Wt 156 lb (70.8 kg)   BMI 25.96 kg/m   Body mass index is 25.96 kg/m.  General appearance : Well developed well nourished female. No acute distress HEENT: Eyes: no retinal hemorrhage or exudates,  Neck supple, trachea midline, no carotid bruits, no thyroidmegaly Lungs: Clear to auscultation, no rhonchi or wheezes, or rib retractions  Heart: Regular rate and rhythm, no murmurs or gallops Breast:Examined in sitting and supine position were symmetrical in appearance, no palpable masses or tenderness,  no skin retraction, no nipple inversion, no nipple discharge, no skin discoloration, no axillary or supraclavicular lymphadenopathy Abdomen: no palpable masses or tenderness, no rebound or guarding Extremities: no edema or skin discoloration or tenderness  Pelvic: Vulva: Normal             Vagina: No gross lesions or discharge  Cervix: No gross lesions or discharge  Uterus  AV, normal size, shape and consistency, non-tender and mobile  Adnexa  Without masses or tenderness  Anus: Normal   Assessment/Plan:  73 y.o. female for annual exam   1. Well female exam with routine gynecological exam Normal gynecologic  exam and menopause.  Pap test March 2019 was negative, no indication to repeat this year.  Breast exam normal.  Bilateral diagnostic mammogram and ultrasound negative in May 2020.  Good body mass index at 25.96.  Continue with healthy nutrition and fitness.  2. Postmenopause Well on no hormone replacement therapy.  No postmenopausal bleeding. - DG Bone Density; Future  3. Age-related osteoporosis without current pathological fracture Bone density March 2019 showed osteoporosis at 1  site which was the right femoral neck with a T score of -2.7.  Patient has chosen not to start on medical bone therapy.  She is taking vitamin D supplements and calcium intake of 1200 to 1500 mg daily and is physically active with regular weightbearing physical activities.  We will repeat a bone density in March 2021. - DG Bone Density; Future  Princess Bruins MD, 2:41 PM 10/14/2018

## 2018-10-18 ENCOUNTER — Encounter: Payer: Self-pay | Admitting: Obstetrics & Gynecology

## 2018-10-18 NOTE — Patient Instructions (Signed)
1. Well female exam with routine gynecological exam Normal gynecologic exam and menopause.  Pap test March 2019 was negative, no indication to repeat this year.  Breast exam normal.  Bilateral diagnostic mammogram and ultrasound negative in May 2020.  Good body mass index at 25.96.  Continue with healthy nutrition and fitness.  2. Postmenopause Well on no hormone replacement therapy.  No postmenopausal bleeding. - DG Bone Density; Future  3. Age-related osteoporosis without current pathological fracture Bone density March 2019 showed osteoporosis at 1 site which was the right femoral neck with a T score of -2.7.  Patient has chosen not to start on medical bone therapy.  She is taking vitamin D supplements and calcium intake of 1200 to 1500 mg daily and is physically active with regular weightbearing physical activities.  We will repeat a bone density in March 2021. - DG Bone Density; Future  Lori [name], it was a pleasure seeing you today!

## 2018-10-22 ENCOUNTER — Telehealth: Payer: Self-pay | Admitting: *Deleted

## 2018-10-22 NOTE — Telephone Encounter (Signed)
-----   Message from Princess Bruins, MD sent at 10/14/2018  2:57 PM EDT ----- Regarding: Refer to Dr Virgina Jock Immune diseases.

## 2018-10-22 NOTE — Telephone Encounter (Signed)
I called Dr. Virgina Jock office and left message for referral coordinator to call me with how to to refer patient.

## 2018-10-27 NOTE — Telephone Encounter (Signed)
I called and spoke with Lori [name] and was informed to fax notes to [phone], she will call to schedule.

## 2018-11-01 ENCOUNTER — Other Ambulatory Visit: Payer: Self-pay | Admitting: Cardiovascular Disease

## 2018-11-03 NOTE — Telephone Encounter (Signed)
[facility] referral coordinator called back stating patient is established with Dr.Shawn and is not able to see Dr. Virgina Jock as they are in the same office and they do not transfer care between providers. Patient asked do you have any other recommendations for other providers?

## 2018-11-04 NOTE — Telephone Encounter (Signed)
Can be referred to Rheumatologist, but don't know one in particular.

## 2018-11-08 NOTE — Telephone Encounter (Signed)
Left detailed message on cell to check with PCP for referral, Rheumatologist require labs with referral

## 2018-11-15 ENCOUNTER — Other Ambulatory Visit: Payer: PPO

## 2018-11-18 ENCOUNTER — Inpatient Hospital Stay: Payer: PPO | Attending: Hematology & Oncology

## 2018-11-18 ENCOUNTER — Other Ambulatory Visit: Payer: Self-pay

## 2018-11-18 LAB — CBC WITH DIFFERENTIAL (CANCER CENTER ONLY)
Abs Immature Granulocytes: 0.01 10*3/uL (ref 0.00–0.07)
Basophils Absolute: 0 10*3/uL (ref 0.0–0.1)
Basophils Relative: 1 %
Eosinophils Absolute: 0.1 10*3/uL (ref 0.0–0.5)
Eosinophils Relative: 1 %
HCT: 40.7 % (ref 36.0–46.0)
Hemoglobin: 12.9 g/dL (ref 12.0–15.0)
Immature Granulocytes: 0 %
Lymphocytes Relative: 38 %
Lymphs Abs: 2.5 10*3/uL (ref 0.7–4.0)
MCH: 29.9 pg (ref 26.0–34.0)
MCHC: 31.7 g/dL (ref 30.0–36.0)
MCV: 94.2 fL (ref 80.0–100.0)
Monocytes Absolute: 0.7 10*3/uL (ref 0.1–1.0)
Monocytes Relative: 10 %
Neutro Abs: 3.2 10*3/uL (ref 1.7–7.7)
Neutrophils Relative %: 50 %
Platelet Count: 288 10*3/uL (ref 150–400)
RBC: 4.32 MIL/uL (ref 3.87–5.11)
RDW: 12.1 % (ref 11.5–15.5)
WBC Count: 6.4 10*3/uL (ref 4.0–10.5)
nRBC: 0 % (ref 0.0–0.2)

## 2018-11-18 LAB — CMP (CANCER CENTER ONLY)
ALT: 28 U/L (ref 0–44)
AST: 31 U/L (ref 15–41)
Albumin: 4.3 g/dL (ref 3.5–5.0)
Alkaline Phosphatase: 76 U/L (ref 38–126)
Anion gap: 8 (ref 5–15)
BUN: 17 mg/dL (ref 8–23)
CO2: 28 mmol/L (ref 22–32)
Calcium: 10.1 mg/dL (ref 8.9–10.3)
Chloride: 103 mmol/L (ref 98–111)
Creatinine: 0.69 mg/dL (ref 0.44–1.00)
GFR, Est AFR Am: >60 mL/min (ref >60)
GFR, Estimated: >60 mL/min (ref >60)
Glucose, Bld: 91 mg/dL (ref 70–99)
Potassium: 4.5 mmol/L (ref 3.5–5.1)
Sodium: 139 mmol/L (ref 135–145)
Total Bilirubin: 0.5 mg/dL (ref 0.3–1.2)
Total Protein: 6.6 g/dL (ref 6.5–8.1)

## 2018-11-18 LAB — IRON AND TIBC
Iron: 60 ug/dL (ref 41–142)
Saturation Ratios: 17 % — ABNORMAL LOW (ref 21–57)
TIBC: 348 ug/dL (ref 236–444)
UIBC: 287 ug/dL (ref 120–384)

## 2018-11-18 LAB — FERRITIN: Ferritin: 7 ng/mL — ABNORMAL LOW (ref 11–307)

## 2018-11-24 ENCOUNTER — Other Ambulatory Visit: Payer: Self-pay | Admitting: Family Medicine

## 2018-11-24 DIAGNOSIS — R05 Cough: Secondary | ICD-10-CM

## 2018-11-24 DIAGNOSIS — R160 Hepatomegaly, not elsewhere classified: Secondary | ICD-10-CM

## 2018-11-24 DIAGNOSIS — R059 Cough, unspecified: Secondary | ICD-10-CM

## 2018-12-07 ENCOUNTER — Ambulatory Visit
Admission: RE | Admit: 2018-12-07 | Discharge: 2018-12-07 | Disposition: A | Discharge status: Home / Self Care | Payer: PPO | Source: Ambulatory Visit | Attending: Family Medicine | Admitting: Family Medicine

## 2018-12-07 DIAGNOSIS — R059 Cough, unspecified: Secondary | ICD-10-CM

## 2018-12-07 DIAGNOSIS — R05 Cough: Secondary | ICD-10-CM | POA: Diagnosis not present

## 2018-12-10 ENCOUNTER — Other Ambulatory Visit: Payer: Self-pay | Admitting: Cardiovascular Disease

## 2018-12-10 ENCOUNTER — Telehealth: Payer: Self-pay | Admitting: Cardiovascular Disease

## 2018-12-10 MED ORDER — METOPROLOL SUCCINATE ER 25 MG PO TB24: ORAL_TABLET | ORAL | 2 refills | Status: DC

## 2018-12-10 NOTE — Telephone Encounter (Signed)
Metoprolol succinate 25 mg in the morning and 12.5 mg in the evening refilled.

## 2018-12-10 NOTE — Telephone Encounter (Signed)
Pt calling requesting refills on metoprolol sent to [facility]. Pt stated that when she gets a refill, it does not have refills with it. Please send in medication with refills, pt has seen Dr. Claiborne Billings in April. Please address

## 2018-12-16 DIAGNOSIS — H40013 Open angle with borderline findings, low risk, bilateral: Secondary | ICD-10-CM | POA: Diagnosis not present

## 2018-12-16 DIAGNOSIS — H43812 Vitreous degeneration, left eye: Secondary | ICD-10-CM | POA: Diagnosis not present

## 2018-12-16 DIAGNOSIS — H52223 Regular astigmatism, bilateral: Secondary | ICD-10-CM | POA: Diagnosis not present

## 2018-12-16 DIAGNOSIS — H524 Presbyopia: Secondary | ICD-10-CM | POA: Diagnosis not present

## 2018-12-16 DIAGNOSIS — H2513 Age-related nuclear cataract, bilateral: Secondary | ICD-10-CM | POA: Diagnosis not present

## 2018-12-16 DIAGNOSIS — H5203 Hypermetropia, bilateral: Secondary | ICD-10-CM | POA: Diagnosis not present

## 2018-12-21 DIAGNOSIS — D2362 Other benign neoplasm of skin of left upper limb, including shoulder: Secondary | ICD-10-CM | POA: Diagnosis not present

## 2018-12-21 DIAGNOSIS — L820 Inflamed seborrheic keratosis: Secondary | ICD-10-CM | POA: Diagnosis not present

## 2018-12-21 DIAGNOSIS — L821 Other seborrheic keratosis: Secondary | ICD-10-CM | POA: Diagnosis not present

## 2018-12-21 DIAGNOSIS — L720 Epidermal cyst: Secondary | ICD-10-CM | POA: Diagnosis not present

## 2018-12-21 DIAGNOSIS — Z85828 Personal history of other malignant neoplasm of skin: Secondary | ICD-10-CM | POA: Diagnosis not present

## 2018-12-21 DIAGNOSIS — L570 Actinic keratosis: Secondary | ICD-10-CM | POA: Diagnosis not present

## 2018-12-21 DIAGNOSIS — D2272 Melanocytic nevi of left lower limb, including hip: Secondary | ICD-10-CM | POA: Diagnosis not present

## 2018-12-22 ENCOUNTER — Ambulatory Visit
Admission: RE | Admit: 2018-12-22 | Discharge: 2018-12-22 | Disposition: A | Discharge status: Home / Self Care | Payer: PPO | Source: Ambulatory Visit | Attending: Family Medicine | Admitting: Family Medicine

## 2018-12-22 DIAGNOSIS — R160 Hepatomegaly, not elsewhere classified: Secondary | ICD-10-CM | POA: Diagnosis not present

## 2018-12-22 DIAGNOSIS — R5381 Other malaise: Secondary | ICD-10-CM | POA: Diagnosis not present

## 2018-12-22 DIAGNOSIS — R002 Palpitations: Secondary | ICD-10-CM | POA: Diagnosis not present

## 2018-12-22 DIAGNOSIS — Z9049 Acquired absence of other specified parts of digestive tract: Secondary | ICD-10-CM | POA: Diagnosis not present

## 2018-12-22 DIAGNOSIS — K760 Fatty (change of) liver, not elsewhere classified: Secondary | ICD-10-CM | POA: Diagnosis not present

## 2019-01-06 DIAGNOSIS — R1013 Epigastric pain: Secondary | ICD-10-CM | POA: Diagnosis not present

## 2019-01-14 ENCOUNTER — Inpatient Hospital Stay: Payer: PPO | Attending: Hematology & Oncology

## 2019-02-02 DIAGNOSIS — K317 Polyp of stomach and duodenum: Secondary | ICD-10-CM | POA: Diagnosis not present

## 2019-02-02 DIAGNOSIS — K449 Diaphragmatic hernia without obstruction or gangrene: Secondary | ICD-10-CM | POA: Diagnosis not present

## 2019-02-02 DIAGNOSIS — R1013 Epigastric pain: Secondary | ICD-10-CM | POA: Diagnosis not present

## 2019-02-02 DIAGNOSIS — K2950 Unspecified chronic gastritis without bleeding: Secondary | ICD-10-CM | POA: Diagnosis not present

## 2019-02-02 DIAGNOSIS — K2930 Chronic superficial gastritis without bleeding: Secondary | ICD-10-CM | POA: Diagnosis not present

## 2019-03-02 ENCOUNTER — Encounter: Payer: Self-pay | Admitting: Gynecology

## 2019-03-10 DIAGNOSIS — E038 Other specified hypothyroidism: Secondary | ICD-10-CM | POA: Diagnosis not present

## 2019-03-10 DIAGNOSIS — M810 Age-related osteoporosis without current pathological fracture: Secondary | ICD-10-CM | POA: Diagnosis not present

## 2019-03-10 DIAGNOSIS — E7849 Other hyperlipidemia: Secondary | ICD-10-CM | POA: Diagnosis not present

## 2019-03-10 DIAGNOSIS — R7301 Impaired fasting glucose: Secondary | ICD-10-CM | POA: Diagnosis not present

## 2019-03-16 ENCOUNTER — Other Ambulatory Visit: Payer: PPO

## 2019-03-16 ENCOUNTER — Telehealth: Payer: Self-pay | Admitting: Podiatry

## 2019-03-16 DIAGNOSIS — E039 Hypothyroidism, unspecified: Secondary | ICD-10-CM | POA: Diagnosis not present

## 2019-03-16 DIAGNOSIS — R5381 Other malaise: Secondary | ICD-10-CM | POA: Diagnosis not present

## 2019-03-16 DIAGNOSIS — N959 Unspecified menopausal and perimenopausal disorder: Secondary | ICD-10-CM | POA: Diagnosis not present

## 2019-03-16 DIAGNOSIS — N390 Urinary tract infection, site not specified: Secondary | ICD-10-CM | POA: Diagnosis not present

## 2019-03-16 NOTE — Telephone Encounter (Signed)
Patient came in requesting a temp handicap placard. She had a procedure back in March with Dr. Paulla Dolly but has not been in since then. I let her know I would speak with you to see if Dr. Alfonso Patten would approve one or if she would have to come back in to be re-evaluated. Please advise.Marland KitchenMarland Kitchen

## 2019-03-16 NOTE — Telephone Encounter (Signed)
Left message informing pt that if she was having trouble walking she needed to have an appt to be evaluated.

## 2019-03-17 DIAGNOSIS — R7301 Impaired fasting glucose: Secondary | ICD-10-CM | POA: Diagnosis not present

## 2019-03-17 DIAGNOSIS — Z1331 Encounter for screening for depression: Secondary | ICD-10-CM | POA: Diagnosis not present

## 2019-03-17 DIAGNOSIS — F39 Unspecified mood [affective] disorder: Secondary | ICD-10-CM | POA: Diagnosis not present

## 2019-03-17 DIAGNOSIS — E039 Hypothyroidism, unspecified: Secondary | ICD-10-CM | POA: Diagnosis not present

## 2019-03-17 DIAGNOSIS — Z800 Family history of malignant neoplasm of digestive organs: Secondary | ICD-10-CM | POA: Diagnosis not present

## 2019-03-17 DIAGNOSIS — M810 Age-related osteoporosis without current pathological fracture: Secondary | ICD-10-CM | POA: Diagnosis not present

## 2019-03-17 DIAGNOSIS — E785 Hyperlipidemia, unspecified: Secondary | ICD-10-CM | POA: Diagnosis not present

## 2019-03-17 DIAGNOSIS — Z0000 Encounter for general adult medical examination without abnormal findings: Secondary | ICD-10-CM | POA: Diagnosis not present

## 2019-03-17 DIAGNOSIS — M351 Other overlap syndromes: Secondary | ICD-10-CM | POA: Diagnosis not present

## 2019-05-05 DIAGNOSIS — L820 Inflamed seborrheic keratosis: Secondary | ICD-10-CM | POA: Diagnosis not present

## 2019-05-05 DIAGNOSIS — D2362 Other benign neoplasm of skin of left upper limb, including shoulder: Secondary | ICD-10-CM | POA: Diagnosis not present

## 2019-05-05 DIAGNOSIS — D2262 Melanocytic nevi of left upper limb, including shoulder: Secondary | ICD-10-CM | POA: Diagnosis not present

## 2019-05-05 DIAGNOSIS — Z85828 Personal history of other malignant neoplasm of skin: Secondary | ICD-10-CM | POA: Diagnosis not present

## 2019-05-05 DIAGNOSIS — L720 Epidermal cyst: Secondary | ICD-10-CM | POA: Diagnosis not present

## 2019-05-05 DIAGNOSIS — H61001 Unspecified perichondritis of right external ear: Secondary | ICD-10-CM | POA: Diagnosis not present

## 2019-05-05 DIAGNOSIS — L309 Dermatitis, unspecified: Secondary | ICD-10-CM | POA: Diagnosis not present

## 2019-05-16 ENCOUNTER — Inpatient Hospital Stay: Payer: PPO | Attending: Hematology & Oncology

## 2019-05-16 ENCOUNTER — Other Ambulatory Visit: Payer: Self-pay

## 2019-05-16 LAB — CBC WITH DIFFERENTIAL (CANCER CENTER ONLY)
Abs Immature Granulocytes: 0.02 10*3/uL (ref 0.00–0.07)
Basophils Absolute: 0 10*3/uL (ref 0.0–0.1)
Basophils Relative: 1 %
Eosinophils Absolute: 0.1 10*3/uL (ref 0.0–0.5)
Eosinophils Relative: 1 %
HCT: 44.9 % (ref 36.0–46.0)
Hemoglobin: 14.9 g/dL (ref 12.0–15.0)
Immature Granulocytes: 0 %
Lymphocytes Relative: 40 %
Lymphs Abs: 3.3 10*3/uL (ref 0.7–4.0)
MCH: 31.7 pg (ref 26.0–34.0)
MCHC: 33.2 g/dL (ref 30.0–36.0)
MCV: 95.5 fL (ref 80.0–100.0)
Monocytes Absolute: 0.7 10*3/uL (ref 0.1–1.0)
Monocytes Relative: 9 %
Neutro Abs: 4.2 10*3/uL (ref 1.7–7.7)
Neutrophils Relative %: 49 %
Platelet Count: 242 10*3/uL (ref 150–400)
RBC: 4.7 MIL/uL (ref 3.87–5.11)
RDW: 12.6 % (ref 11.5–15.5)
WBC Count: 8.3 10*3/uL (ref 4.0–10.5)
nRBC: 0 % (ref 0.0–0.2)

## 2019-05-16 LAB — CMP (CANCER CENTER ONLY)
ALT: 34 U/L (ref 0–44)
AST: 32 U/L (ref 15–41)
Albumin: 4.7 g/dL (ref 3.5–5.0)
Alkaline Phosphatase: 73 U/L (ref 38–126)
Anion gap: 11 (ref 5–15)
BUN: 16 mg/dL (ref 8–23)
CO2: 26 mmol/L (ref 22–32)
Calcium: 10 mg/dL (ref 8.9–10.3)
Chloride: 100 mmol/L (ref 98–111)
Creatinine: 0.76 mg/dL (ref 0.44–1.00)
GFR, Est AFR Am: >60 mL/min (ref >60)
GFR, Estimated: >60 mL/min (ref >60)
Glucose, Bld: 105 mg/dL — ABNORMAL HIGH (ref 70–99)
Potassium: 4 mmol/L (ref 3.5–5.1)
Sodium: 137 mmol/L (ref 135–145)
Total Bilirubin: 0.5 mg/dL (ref 0.3–1.2)
Total Protein: 7.4 g/dL (ref 6.5–8.1)

## 2019-05-17 ENCOUNTER — Telehealth: Payer: Self-pay | Admitting: *Deleted

## 2019-05-17 LAB — FERRITIN: Ferritin: 23 ng/mL (ref 11–307)

## 2019-05-17 LAB — IRON AND TIBC
Iron: 164 ug/dL — ABNORMAL HIGH (ref 41–142)
Saturation Ratios: 59 % — ABNORMAL HIGH (ref 21–57)
TIBC: 278 ug/dL (ref 236–444)
UIBC: 113 ug/dL — ABNORMAL LOW (ref 120–384)

## 2019-05-17 NOTE — Telephone Encounter (Signed)
-----   Message from Volanda Napoleon, MD sent at 05/17/2019 11:11 AM EST ----- Call - the iron saturation is too high!!  Needs 1 phlebotomy. pete

## 2019-05-17 NOTE — Telephone Encounter (Signed)
As noted below by Dr. Marin Olp, I informed the patient of the iron saturation level. You need a phlebotomy. She verbalized understanding.

## 2019-05-24 ENCOUNTER — Other Ambulatory Visit: Payer: Self-pay

## 2019-05-24 ENCOUNTER — Inpatient Hospital Stay: Payer: PPO

## 2019-05-24 NOTE — Progress Notes (Signed)
Multiple attempts for phlebotomy for pt. Unable to get IV access due to vein clotting off and infiltrating. Pt has very limited IV access and will reschedule for a different day. Message to scheduling.

## 2019-06-01 ENCOUNTER — Telehealth: Payer: Self-pay | Admitting: Hematology & Oncology

## 2019-06-01 NOTE — Telephone Encounter (Signed)
Called and LMVM for patient regarding appointment scheduled per 12/29 sch msg

## 2019-06-03 ENCOUNTER — Other Ambulatory Visit: Payer: Self-pay

## 2019-06-03 ENCOUNTER — Inpatient Hospital Stay: Payer: PPO | Attending: Hematology & Oncology

## 2019-06-03 ENCOUNTER — Encounter: Payer: Self-pay | Admitting: Cardiovascular Disease

## 2019-06-03 VITALS — BP 132/68 | HR 72 | Temp 97.3°F | Resp 18

## 2019-06-03 DIAGNOSIS — E039 Hypothyroidism, unspecified: Secondary | ICD-10-CM | POA: Diagnosis not present

## 2019-06-03 DIAGNOSIS — N951 Menopausal and female climacteric states: Secondary | ICD-10-CM | POA: Diagnosis not present

## 2019-06-03 DIAGNOSIS — R5381 Other malaise: Secondary | ICD-10-CM | POA: Diagnosis not present

## 2019-06-03 DIAGNOSIS — M329 Systemic lupus erythematosus, unspecified: Secondary | ICD-10-CM | POA: Diagnosis not present

## 2019-06-03 DIAGNOSIS — K760 Fatty (change of) liver, not elsewhere classified: Secondary | ICD-10-CM | POA: Diagnosis not present

## 2019-06-03 DIAGNOSIS — M810 Age-related osteoporosis without current pathological fracture: Secondary | ICD-10-CM

## 2019-06-03 DIAGNOSIS — Z452 Encounter for adjustment and management of vascular access device: Secondary | ICD-10-CM

## 2019-06-03 MED ORDER — SODIUM CHLORIDE 0.9 % IV SOLN
Freq: Once | INTRAVENOUS | Status: DC
  Filled 2019-06-03: qty 250

## 2019-06-03 NOTE — Patient Instructions (Signed)
Therapeutic Phlebotomy Therapeutic phlebotomy is the planned removal of blood from a person's body for the purpose of treating a medical condition. The procedure is similar to donating blood. Usually, about a pint (470 mL, or 0.47 L) of blood is removed. The average adult has 9-12 pints (4.3-5.7 L) of blood in the body. Therapeutic phlebotomy may be used to treat the following medical conditions:  Hemochromatosis. This is a condition in which the blood contains too much iron.  Polycythemia vera. This is a condition in which the blood contains too many red blood cells.  Porphyria cutanea tarda. This is a disease in which an important part of hemoglobin is not made properly. It results in the buildup of abnormal amounts of porphyrins in the body.  Sickle cell disease. This is a condition in which the red blood cells form an abnormal crescent shape rather than a round shape. Tell a health care provider about:  Any allergies you have.  All medicines you are taking, including vitamins, herbs, eye drops, creams, and over-the-counter medicines.  Any problems you or family members have had with anesthetic medicines.  Any blood disorders you have.  Any surgeries you have had.  Any medical conditions you have.  Whether you are pregnant or may be pregnant. What are the risks? Generally, this is a safe procedure. However, problems may occur, including:  Nausea or light-headedness.  Low blood pressure (hypotension).  Soreness, bleeding, swelling, or bruising at the needle insertion site.  Infection. What happens before the procedure?  Follow instructions from your health care provider about eating or drinking restrictions.  Ask your health care provider about: ? Changing or stopping your regular medicines. This is especially important if you are taking diabetes medicines or blood thinners (anticoagulants). ? Taking medicines such as aspirin and ibuprofen. These medicines can thin your  blood. Do not take these medicines unless your health care provider tells you to take them. ? Taking over-the-counter medicines, vitamins, herbs, and supplements.  Wear clothing with sleeves that can be raised above the elbow.  Plan to have someone take you home from the hospital or clinic.  You may have a blood sample taken.  Your blood pressure, pulse rate, and breathing rate will be measured. What happens during the procedure?   To lower your risk of infection: ? Your health care team will wash or sanitize their hands. ? Your skin will be cleaned with an antiseptic.  You may be given a medicine to numb the area (local anesthetic).  A tourniquet will be placed on your arm.  A needle will be inserted into one of your veins.  Tubing and a collection bag will be attached to that needle.  Blood will flow through the needle and tubing into the collection bag.  The collection bag will be placed lower than your arm to allow gravity to help the flow of blood into the bag.  You may be asked to open and close your hand slowly and continually during the entire collection.  After the specified amount of blood has been removed from your body, the collection bag and tubing will be clamped.  The needle will be removed from your vein.  Pressure will be held on the site of the needle insertion to stop the bleeding.  A bandage (dressing) will be placed over the needle insertion site. The procedure may vary among health care providers and hospitals. What happens after the procedure?  Your blood pressure, pulse rate, and breathing rate will be   measured after the procedure.  You will be encouraged to drink fluids.  Your recovery will be assessed and monitored.  You can return to your normal activities as told by your health care provider. Summary  Therapeutic phlebotomy is the planned removal of blood from a person's body for the purpose of treating a medical condition.  Therapeutic  phlebotomy may be used to treat hemochromatosis, polycythemia vera, porphyria cutanea tarda, or sickle cell disease.  In the procedure, a needle is inserted and about a pint (470 mL, or 0.47 L) of blood is removed. The average adult has 9-12 pints (4.3-5.7 L) of blood in the body.  This is generally a safe procedure, but it can sometimes cause problems such as nausea, light-headedness, or low blood pressure (hypotension). This information is not intended to replace advice given to you by your health care provider. Make sure you discuss any questions you have with your health care provider. Document Revised: 05/28/2017 Document Reviewed: 05/28/2017 Elsevier Patient Education  2020 Elsevier Inc.  

## 2019-06-03 NOTE — Progress Notes (Signed)
18g PIV inserted into LAC, removed 544cc blood per phlebotomy order from [id number]. Pt tolerated procedure with no difficulty. Offer hydration and nutrition to pt and discussed 30 minute observation. Pt refused to stay.

## 2019-06-16 ENCOUNTER — Other Ambulatory Visit: Payer: Self-pay | Admitting: Cardiology

## 2019-07-18 ENCOUNTER — Inpatient Hospital Stay: Payer: PPO | Attending: Hematology & Oncology

## 2019-07-18 ENCOUNTER — Other Ambulatory Visit: Payer: Self-pay

## 2019-07-18 LAB — CMP (CANCER CENTER ONLY)
ALT: 22 U/L (ref 0–44)
AST: 25 U/L (ref 15–41)
Albumin: 4.5 g/dL (ref 3.5–5.0)
Alkaline Phosphatase: 70 U/L (ref 38–126)
Anion gap: 10 (ref 5–15)
BUN: 21 mg/dL (ref 8–23)
CO2: 28 mmol/L (ref 22–32)
Calcium: 10.7 mg/dL — ABNORMAL HIGH (ref 8.9–10.3)
Chloride: 104 mmol/L (ref 98–111)
Creatinine: 0.85 mg/dL (ref 0.44–1.00)
GFR, Est AFR Am: >60 mL/min (ref >60)
GFR, Estimated: >60 mL/min (ref >60)
Glucose, Bld: 105 mg/dL — ABNORMAL HIGH (ref 70–99)
Potassium: 4.3 mmol/L (ref 3.5–5.1)
Sodium: 142 mmol/L (ref 135–145)
Total Bilirubin: 0.4 mg/dL (ref 0.3–1.2)
Total Protein: 7.1 g/dL (ref 6.5–8.1)

## 2019-07-18 LAB — CBC WITH DIFFERENTIAL (CANCER CENTER ONLY)
Abs Immature Granulocytes: 0.02 10*3/uL (ref 0.00–0.07)
Basophils Absolute: 0.1 10*3/uL (ref 0.0–0.1)
Basophils Relative: 1 %
Eosinophils Absolute: 0.1 10*3/uL (ref 0.0–0.5)
Eosinophils Relative: 1 %
HCT: 42.4 % (ref 36.0–46.0)
Hemoglobin: 14 g/dL (ref 12.0–15.0)
Immature Granulocytes: 0 %
Lymphocytes Relative: 38 %
Lymphs Abs: 3 10*3/uL (ref 0.7–4.0)
MCH: 32 pg (ref 26.0–34.0)
MCHC: 33 g/dL (ref 30.0–36.0)
MCV: 97 fL (ref 80.0–100.0)
Monocytes Absolute: 0.8 10*3/uL (ref 0.1–1.0)
Monocytes Relative: 10 %
Neutro Abs: 3.9 10*3/uL (ref 1.7–7.7)
Neutrophils Relative %: 50 %
Platelet Count: 236 10*3/uL (ref 150–400)
RBC: 4.37 MIL/uL (ref 3.87–5.11)
RDW: 12.6 % (ref 11.5–15.5)
WBC Count: 7.8 10*3/uL (ref 4.0–10.5)
nRBC: 0 % (ref 0.0–0.2)

## 2019-07-19 LAB — IRON AND TIBC
Iron: 81 ug/dL (ref 41–142)
Saturation Ratios: 29 % (ref 21–57)
TIBC: 280 ug/dL (ref 236–444)
UIBC: 199 ug/dL (ref 120–384)

## 2019-07-19 LAB — FERRITIN: Ferritin: 13 ng/mL (ref 11–307)

## 2019-09-05 DIAGNOSIS — E0590 Thyrotoxicosis, unspecified without thyrotoxic crisis or storm: Secondary | ICD-10-CM | POA: Diagnosis not present

## 2019-09-12 ENCOUNTER — Other Ambulatory Visit: Payer: Self-pay

## 2019-09-12 ENCOUNTER — Ambulatory Visit (INDEPENDENT_AMBULATORY_CARE_PROVIDER_SITE_OTHER): Payer: PPO | Admitting: Cardiovascular Disease

## 2019-09-12 ENCOUNTER — Encounter: Payer: Self-pay | Admitting: Cardiovascular Disease

## 2019-09-12 VITALS — BP 108/72 | HR 82 | Ht 66.5 in | Wt 155.8 lb

## 2019-09-12 DIAGNOSIS — R0789 Other chest pain: Secondary | ICD-10-CM

## 2019-09-12 DIAGNOSIS — R002 Palpitations: Secondary | ICD-10-CM | POA: Diagnosis not present

## 2019-09-12 DIAGNOSIS — E7800 Pure hypercholesterolemia, unspecified: Secondary | ICD-10-CM | POA: Diagnosis not present

## 2019-09-12 DIAGNOSIS — I498 Other specified cardiac arrhythmias: Secondary | ICD-10-CM

## 2019-09-12 DIAGNOSIS — M359 Systemic involvement of connective tissue, unspecified: Secondary | ICD-10-CM | POA: Diagnosis not present

## 2019-09-12 DIAGNOSIS — R072 Precordial pain: Secondary | ICD-10-CM

## 2019-09-12 NOTE — Patient Instructions (Addendum)
Medication Instructions:  CONTINUE WITH CURRENT MEDICATIONS none.  *If you need a refill on your cardiac medications before your next appointment, please call your pharmacy*   Follow-Up: At [facility], you and your health needs are our priority.  As part of our continuing mission to provide you with exceptional heart care, we have created designated Provider Care Teams.  These Care Teams include your primary Cardiologist (physician) and Advanced Practice Providers (APPs -  Physician Assistants and Nurse Practitioners) who all work together to provide you with the care you need, when you need it.  We recommend signing up for the patient portal called "MyChart".  Sign up information is provided on this After Visit Summary.  MyChart is used to connect with patients for Virtual Visits (Telemedicine).  Patients are able to view lab/test results, encounter notes, upcoming appointments, etc.  Non-urgent messages can be sent to your provider as well.   To learn more about what you can do with MyChart, go to [email or web address].    Your next appointment:   12 month(s)  The format for your next appointment:   In Person  Provider:   Shelva Majestic, MD

## 2019-09-12 NOTE — Progress Notes (Signed)
Patient ID: Lori [name], female   DOB: 06/21/1945, 74 y.o.   MRN: [id number]     HPI: Lori [name] is a 74 year old female who is a former patient of Dr. Rollene Fare.  I last saw her in March 2019.  She presents for 25 month follow-up evaluation.  Lori [name] has a history of episodic palpitations in the past has been demonstrated to have a PACs with short runs of atrial tachycardia. She  has a history of irritable bowel syndrome, chronic fatigue, hypothyroidism.  She has had issues with bladder infections, and underwent bladder tacking surgery in July. She sees Dr. Christena Deem of urology in [facility].  In 2012 an echo Doppler study showed mild concentric left ventricular hypertrophy with normal systolic and diastolic function. There was trivial mitral and tricuspid regurgitation. She previously has had a normal Myoview scan in 2013 she also had carotid studies which essentially were normal.   She sees Dr. Sid Falcon of [place] frequently and is on numerous vitamin and supplement medications.She does take numerous herbal type medications as well as vitamins.  She states she was told of having possible low adrenal function.  She denies any recent episodes of chest pain. There are no episodes of presyncope or syncope.  Since I last saw her 15 months ago, she has felt well from a cardiovascular standpoint.  She does note an occasional palpitation but her current dose of metoprolol 37.5 mg has controlled this most of the time.  At times she has taken an extra 12.5 mg.  She states she has had autoimmune disease with mixed connective tissue disease, autoimmune hepatitis, rheumatoid arthritis, and lupus., hemachromatosis and undergoes periodic phlebotomy by Dr. Marin Olp.  She states several family members has venous disease.  Time.  She notes occasional cramping of her legs at night.  She denies exertional claudication symptoms.  She denies varicose veins.  She denies any episodes of  chest pain.  She notes mild shortness of breath when she bends over.  There is some very mild shortness of breath with activity, which has not changed significantly.  She has been undergoing blood work by Dr. Sid Falcon dose every 3 months and does plaque X treatments.   I last saw her in March 2019 and over the previous year she had started therapy with protandum which she says is a mitochondrial cellular regeneration product.  She takes this for her immunologic diseases which include lupus, RA, mixedconnective tissue disorder, autoimmune hepatitis, hemachromatosis.  She feels significant improved since taking this medication.  Her palpitations had improved with metoprolol succinate and she had self reduced her dose down to 25 mg.  Her LDL cholesterol was elevated and she could not take statin therapy.  Over the past 2 years, she states that she had done fairly well and she continues to see Dr. Sid Falcon every 3 months who checks laboratory each time.  Apparently there has recently been some concern for Hashimoto thyroiditis.  She has been taking metoprolol succinate at 37.5 mg.  She is also followed by Dr. Marin Olp for hemochromatosis and she is heterozygous for the [id number] mutation.  She undergoes phlebotomy as indicated to keep her serum ferritin level less than 35.  She denies chest pain.  She presents for a 2-year evaluation.   Past Medical History:  Diagnosis Date  . Cancer ([place]) 06/2017   SQUAMOS CELL REMOVED FROM FOREHEAD  . Chronic fatigue   . Hormone disorder   . Hypothyroidism   . IBS (irritable  bowel syndrome)   . Lupus ([place])   . Osteoporosis 07/2017   T score -2.7 stable from prior DEXA  . Palpitations   . Rheumatoid arthritis [facility])     Past Surgical History:  Procedure Laterality Date  . ABDOMINAL SURGERY     ABDOMINOPLASTY  . BLADDER SUSPENSION    . BREAST SURGERY  1980   LUMPECTOMY X 2 FROM RIGHT BREAST  . CARDIOVASCULAR STRESS TEST  08/28/2011   Normal  . CAROTID DOPPLER   08/28/2011   Normal, no evidence of significant diameter reduction, dissection, or vascular abnormality  . COLONOSCOPY W/ POLYPECTOMY  2018  . EYE SURGERY    . LAPAROSCOPIC CHOLECYSTECTOMY    . TONSILLECTOMY AND ADENOIDECTOMY    . TRANSTHORACIC ECHOCARDIOGRAM  01/29/2011   EF >55%, mild concentric LVH  . TUBAL LIGATION      Allergies  Allergen Reactions  . Albumin (Human)     Pt stated, "I am not allergic to this and don't know what this means"; 04/27/17  . Amoxicillin Nausea And Vomiting  . Codeine Nausea Only  . Erythromycin Nausea Only and Other (See Comments)  . Iodinated Diagnostic Agents Other (See Comments)  . Keflex [Cephalexin] Itching  . Meperidine     Pt stated, "I don't know what this is"; 04/27/17  . Metoclopramide Hcl   . Other Other (See Comments)    Beef Chicken Dairy Eggs  Pt stated, "My body can't process protein; it makes me sick"  . Shellfish Allergy Other (See Comments)    Pt stated, "I am not allergic to this anymore"; 04/27/17 Pt stated, "I am not allergic to this anymore"; 04/27/17  . Shellfish-Derived Products   . Oxycodone Nausea And Vomiting    Pt stated, "I do not know if I am allergic to this"; 04/27/17    Current Outpatient Medications  Medication Sig Dispense Refill  . amiloride-hydrochlorothiazide (MODURETIC) 5-50 MG tablet     . Ascorbic Acid (VITAMIN C) 1000 MG tablet Take 1,000 mg by mouth daily.     Marland Kitchen b complex vitamins tablet Take by mouth.    . Biotin 5 MG CAPS Take 5 mg by mouth.    . BORON PO Take by mouth daily.    . Calcium-Magnesium-Vitamin D (CALCIUM MAGNESIUM PO) Take by mouth at bedtime.    . Cholecalciferol (VITAMIN D PO) Take 1 tablet by mouth daily.    Marland Kitchen dextroamphetamine (DEXEDRINE SPANSULE) 5 MG 24 hr capsule Take 5 mg by mouth daily.  0  . doxycycline (VIBRA-TABS) 100 MG tablet Take 100 mg by mouth daily.    . fish oil-omega-3 fatty acids 1000 MG capsule Take 1 g by mouth daily.    Marland Kitchen levothyroxine (SYNTHROID, LEVOTHROID) 125  MCG tablet     . LYSINE PO Take 1 tablet by mouth 2 (two) times daily.     . Melatonin CR 3 MG TBCR Take by mouth.    . MELATONIN PO Take 2 mg by mouth at bedtime.     . Melatonin-Pyridoxine (MELATONIN TR WITH VITAMIN B6) 3-10 MG TBCR Take 3 mg by mouth.    . Menaquinone-7 (VITAMIN K2 PO) Take by mouth once.    . Methylsulfonylmethane (MSM) POWD Take by mouth.    . metoprolol succinate (TOPROL-XL) 25 MG 24 hr tablet TAKE 1 TABLET IN AM AND 1/2 TABLET IN THE PM. 90 tablet 0  . Multiple Vitamin (MULTIVITAMIN) tablet Take 1 tablet by mouth daily.    . potassium chloride SA (K-DUR,KLOR-CON)  20 MEQ tablet Take 1 tablet by mouth daily.    . Prasterone, DHEA, 10 MG CAPS Take 7.5 mg by mouth daily.    . progesterone (PROMETRIUM) 200 MG capsule Take by mouth.    . Thyroid (NATURE-THROID) 97.5 MG TABS Take 1 tablet by mouth daily.     Current Facility-Administered Medications  Medication Dose Route Frequency Provider Last Rate Last Admin  . triamcinolone acetonide (KENALOG) 10 MG/ML injection 10 mg  10 mg Other Once Regal, Norman S, DPM      . triamcinolone acetonide (KENALOG) 10 MG/ML injection 10 mg  10 mg Other Once Regal, Norman S, DPM      . triamcinolone acetonide (KENALOG) 10 MG/ML injection 10 mg  10 mg Other Once Regal, Norman S, DPM        Socially History is notable in that she is married for >45 years. She has 3 children and 2 grandchildren. She completed college. She does drink occasional wine. There is no tobacco history. She does walk occasionally.   Family History  Problem Relation Age of Onset  . Seizures Mother   . Transient ischemic attack Mother   . Glaucoma Mother   . Cancer Father 17       COLON  . Diabetes Father   . Heart disease Father   . Breast cancer Maternal Aunt   . Heart disease Paternal Uncle   . Breast cancer Maternal Aunt   . Glaucoma Maternal Grandmother     ROS General: Negative; No fevers, chills, or night sweats; positive for chronic fatigue HEENT:  Negative; No changes in vision or hearing, sinus congestion, difficulty swallowing Pulmonary: Negative; No cough, wheezing, shortness of breath, hemoptysis Cardiovascular: Negative; No chest pain, presyncope, syncope, palpitations GI: Nausea for irritable bowel syndrome No nausea, vomiting, diarrhea, or abdominal pain GU: Negative; No dysuria, hematuria, or difficulty voiding Musculoskeletal: Positive for mixed connective tissue disease Rheumatologic: Reported history of lupus and rheumatoid disease. Hematologic/Oncology: Positive for hemachromatosis for which she undergoes phlebotomy Endocrine: Negative; no heat/cold intolerance; no diabetes Neuro: Negative; no changes in balance, headaches Skin: Negative; No rashes or skin lesions Psychiatric: Negative; No behavioral problems, depression Sleep: Negative; No snoring, daytime sleepiness, hypersomnolence, bruxism, restless legs, hypnogognic hallucinations, no cataplexy Other comprehensive 14 point system review is negative.   PE BP 108/72   Pulse 82   Ht 5' 6.5" (1.689 m)   Wt 155 lb 12.8 oz (70.7 kg)   SpO2 95%   BMI 24.77 kg/m    Repeat blood pressure by me 116/70  Wt Readings from Last 3 Encounters:  09/12/19 155 lb 12.8 oz (70.7 kg)  10/14/18 156 lb (70.8 kg)  09/14/18 158 lb (71.7 kg)   General: Alert, oriented, no distress.  Skin: normal turgor, no rashes, warm and dry HEENT: Normocephalic, atraumatic. Pupils equal round and reactive to light; sclera anicteric; extraocular muscles intact; Nose without nasal septal hypertrophy Mouth/Parynx benign; Mallinpatti scale 2 Neck: No JVD, no carotid bruits; normal carotid upstroke Lungs: clear to ausculatation and percussion; no wheezing or rales Chest wall: without tenderness to palpitation Heart: PMI not displaced, RRR, s1 s2 normal, 1/6 systolic murmur, no diastolic murmur, no rubs, gallops, thrills, or heaves Abdomen: soft, nontender; no hepatosplenomehaly, BS+; abdominal  aorta nontender and not dilated by palpation. Back: no CVA tenderness Pulses 2+ Musculoskeletal: full range of motion, normal strength, no joint deformities Extremities: no clubbing cyanosis or edema, Homan's sign negative  Neurologic: grossly nonfocal; Cranial nerves grossly wnl Psychologic: Normal mood  and affect   ECG (independently read by me): Normal sinus rhythm at 82 bpm.  First-degree AV block with a PR interval at 212 ms.  No ectopy.  March 2019 ECG (independently read by me): Normal sinus rhythm at 88 bpm.  Borderline first-degree AV block with a PR interval of 204 ms.  Normal intervals.  No ST segment changes.  February 2018 ECG (independently read by me): Normal sinus rhythm with PACs and mild sinus arrhythmia.  QTc interval 442 ms.  November 2016 ECG (independently read by me): ECG reveals sinus rhythm with first-degree AV block with PR interval 216 only seconds. Occasional PACs.  Nonspecific ST changes.  November 2015 ECG (independently read by me): Sinus rhythm with sinus arrhythmia and PACs and transient atrial bigeminal like pattern  Prior November 2014  ECG: Sinus rhythm with PAC. QTc interval 442 ms. PR interval 200 ms  LABS: BMP Latest Ref Rng & Units 07/18/2019 05/16/2019 11/18/2018  Glucose 70 - 99 mg/dL 105(H) 105(H) 91  BUN 8 - 23 mg/dL _0 Creatinine 0.44 - 1.00 mg/dL 0.85 0.76 0.69  Sodium 135 - 145 mmol/L 142 137 139  Potassium 3.5 - 5.1 mmol/L 4.3 4.0 4.5  Chloride 98 - 111 mmol/L 104 100 103  CO2 22 - 32 mmol/L _1 Calcium 8.9 - 10.3 mg/dL 10.7(H) 10.0 10.1   Hepatic Function Latest Ref Rng & Units 07/18/2019 05/16/2019 11/18/2018  Total Protein 6.5 - 8.1 g/dL 7.1 7.4 6.6  Albumin 3.5 - 5.0 g/dL 4.5 4.7 4.3  AST 15 - 41 U/L 25 32 31  ALT 0 - 44 U/L 22 34 28  Alk Phosphatase 38 - 126 U/L 70 73 76  Total Bilirubin 0.3 - 1.2 mg/dL 0.4 0.5 0.5  Bilirubin, Direct 0.0 - 0.3 mg/dL - - -   CBC Latest Ref Rng & Units 07/18/2019 05/16/2019 11/18/2018    WBC 4.0 - 10.5 K/uL 7.8 8.3 6.4  Hemoglobin 12.0 - 15.0 g/dL 14.0 14.9 12.9  Hematocrit 36.0 - 46.0 % 42.4 44.9 40.7  Platelets 150 - 400 K/uL 236 242 288   Lab Results  Component Value Date   MCV 97.0 07/18/2019   MCV 95.5 05/16/2019   MCV 94.2 11/18/2018   No results found for: TSH   No results found for: HGBA1C   Lipid Panel     Component Value Date/Time   CHOL (H) 08/18/2008 0250    209        ATP III CLASSIFICATION:  <200     mg/dL   Desirable  200-239  mg/dL   Borderline High  >=240    mg/dL   High          TRIG 87 08/18/2008 0250   HDL 58 08/18/2008 0250   CHOLHDL 3.6 08/18/2008 0250   VLDL 17 08/18/2008 0250   LDLCALC (H) 08/18/2008 0250    134        Total Cholesterol/HDL:CHD Risk Coronary Heart Disease Risk Table                     Men   Women  1/2 Average Risk   3.4   3.3  Average Risk       5.0   4.4  2 X Average Risk   9.6   7.1  3 X Average Risk  23.4   11.0        Use the calculated Patient Ratio above and the CHD Risk Table  to determine the patient's CHD Risk.        ATP III CLASSIFICATION (LDL):  <100     mg/dL   Optimal  100-129  mg/dL   Near or Above                    Optimal  130-159  mg/dL   Borderline  160-189  mg/dL   High  >190     mg/dL   Very High     RADIOLOGY: Dg Foot Complete Left  04/17/2013   Multiple view x-rays of left foot indicate no signs of stress fracture  with what appears to be arthritis at the fifth MPJ with narrowing of the  joint surface noted x-ray  Dg Foot Complete Right  04/17/2013   Multiple view x-rays of right foot indicate good alignment with no signs  of stress fracture or arthritis noted  IMPRESSION:  1. Palpitations   2. Atypical chest pain   3. Pure hypercholesterolemia   4. Primary hemochromatosis ([place])   5. Autoimmune disease [facility])    ASSESSMENT AND PLAN: Lori [name] is a 74 year old female who is on multiple vitamins and additional supplemental medications prescribed by Dr.  Gerarda Gunther at [facility].  She has a history of palpitations and has been taking Toprol-XL.  In the past, palpitations were successfully treated with titration of metoprolol succinate to 37.5 mg daily but when I last saw her 2 years ago she had self reduced her dose back down to 25 mg.  Over the past 4 months she has had increasing palpitations and is now back taking 37.5 mg daily.  She still experiences occasional palpitations.  She was seen in April 2020 by Dr. Percival Spanish with multiple complaints particular GI problems.  He also had atypical chest pain not felt to be ischemic.  Presently, she denies chest pain.  She denies presyncope or syncope.  There has been concern for possible Hashimoto's disease as part of her immunologic problems.  She has laboratory checked at 73-monthintervals.  Her resting pulse today is in the 80s.  I have suggested slight titration of metoprolol to 50 mg daily to see if this continues to improve her palpitations.  She is now on nature thyroid 97.5 mg daily and cannot take levothyroxine.  She has hereditary hemochromatosis and has been undergoing as needed phlebotomy to keep her ferritin level less than 35.  She was last seen by hematology in April 2020 and has a 1 year follow-up evaluation upcoming with Dr. EMarin Olp  Lipid studies were elevated in the past and she has not wanted to initiate treatment due to her autoimmune liver disease.  I will see her in 1 year for reevaluation.   TTroy Sine MD, [facility]4/25/2021 9:48 AM

## 2019-09-15 ENCOUNTER — Inpatient Hospital Stay: Payer: PPO | Admitting: Hematology & Oncology

## 2019-09-15 ENCOUNTER — Inpatient Hospital Stay: Payer: PPO

## 2019-09-18 ENCOUNTER — Other Ambulatory Visit: Payer: Self-pay | Admitting: Cardiology

## 2019-09-18 ENCOUNTER — Encounter: Payer: Self-pay | Admitting: Cardiovascular Disease

## 2019-10-18 DIAGNOSIS — E039 Hypothyroidism, unspecified: Secondary | ICD-10-CM | POA: Diagnosis not present

## 2019-10-19 DIAGNOSIS — M25552 Pain in left hip: Secondary | ICD-10-CM | POA: Diagnosis not present

## 2019-10-20 IMAGING — US US ABDOMEN COMPLETE
1 series · 14 of 25 positions shown · non-contrast
Comparison: CT abdomen pelvis of 06/23/2016

CLINICAL DATA: History of autoimmune hepatitis, irritable bowel
syndrome

EXAM:
ABDOMEN ULTRASOUND COMPLETE

[Series 1: us abdomen complete · 0.20mm/px · 14 of 89 slices shown]
[im 1/89]
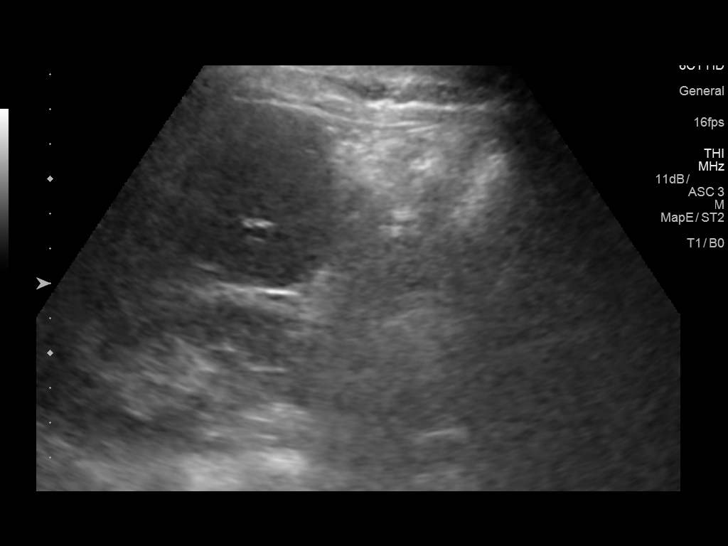
[im 8/89]
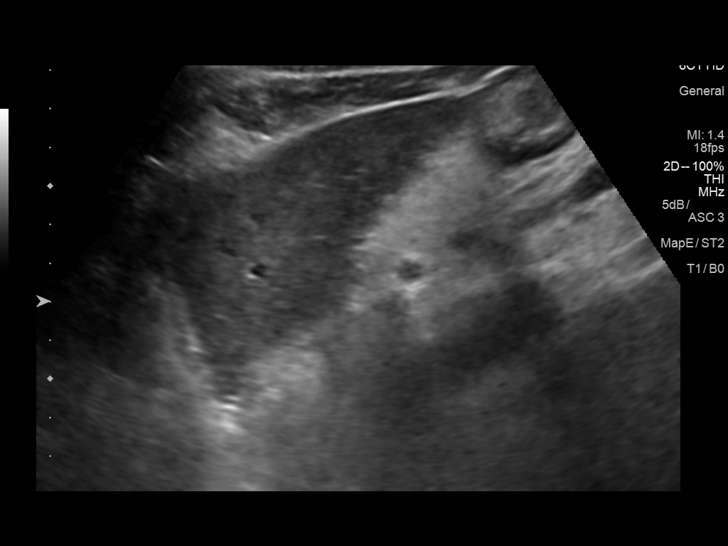
[im 15/89]
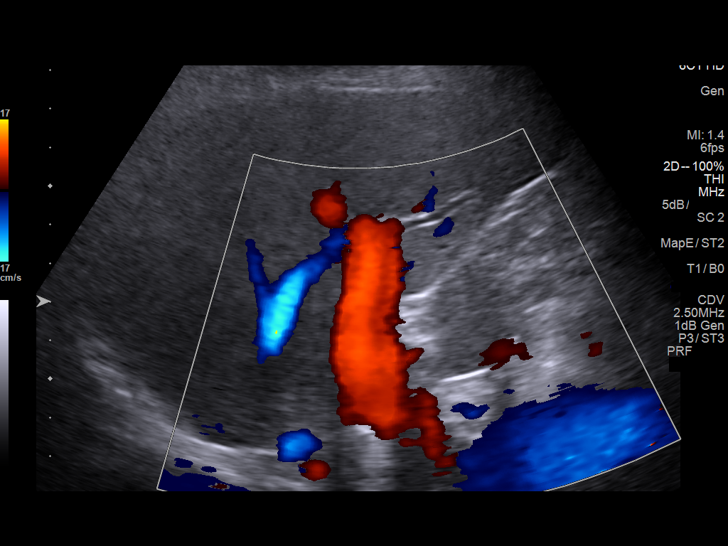
[im 23/89]
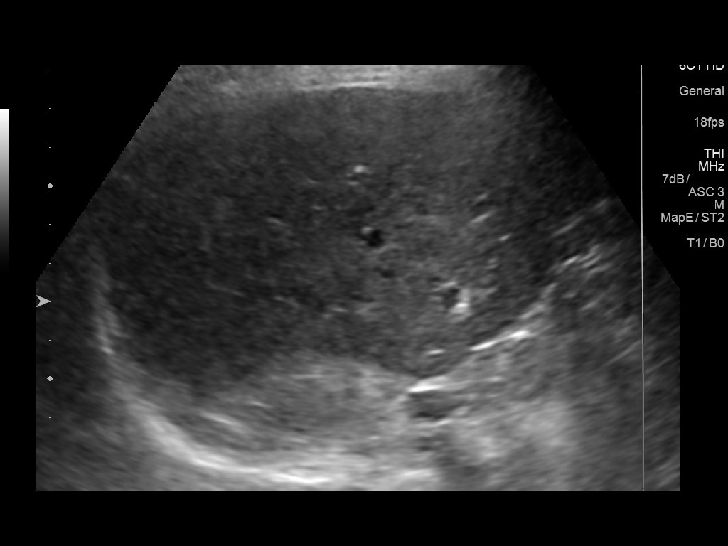
[im 30/89]
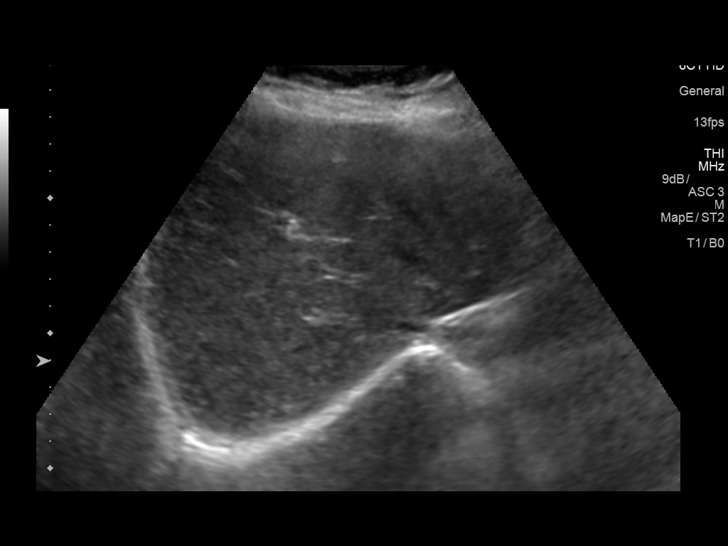
[im 34/89]
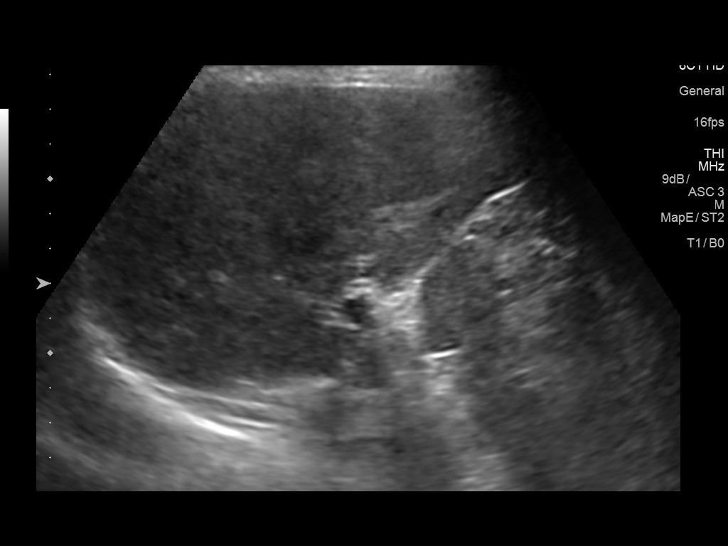
[im 41/89]
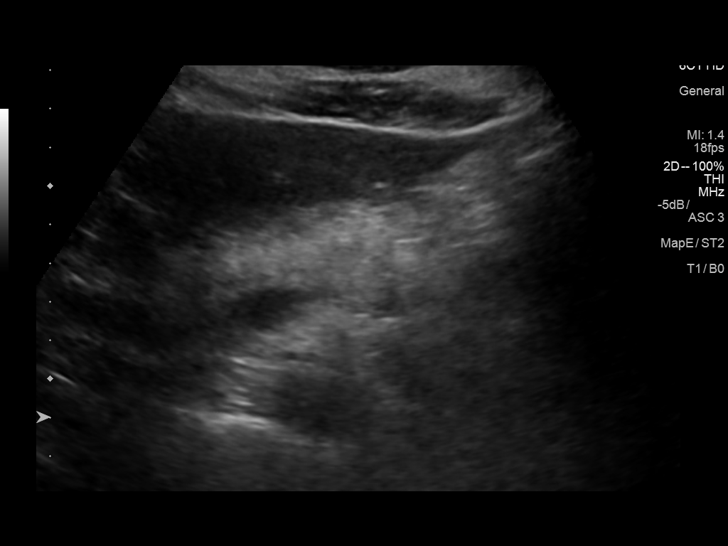
[im 48/89]
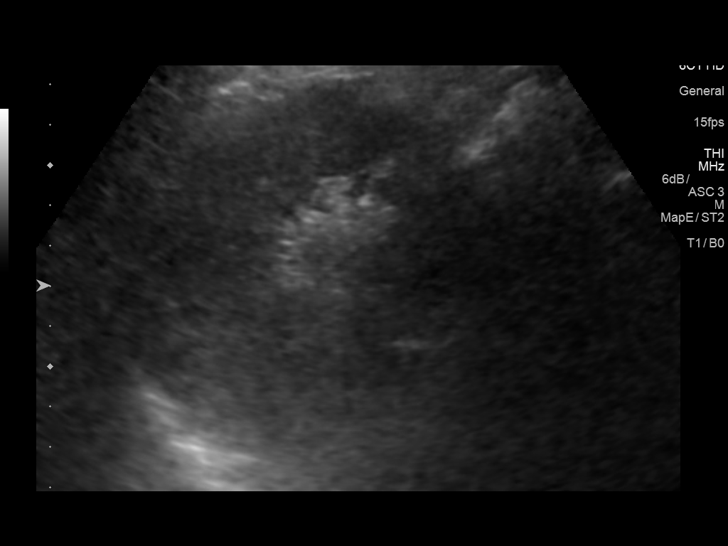
[im 56/89]
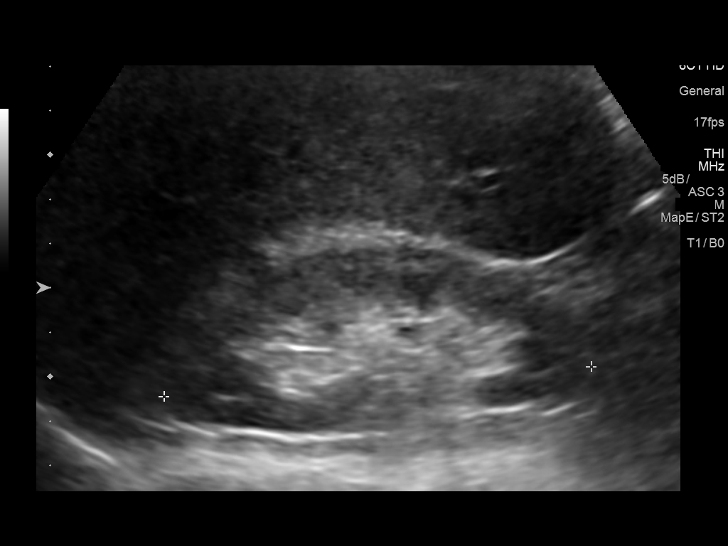
[im 59/89]
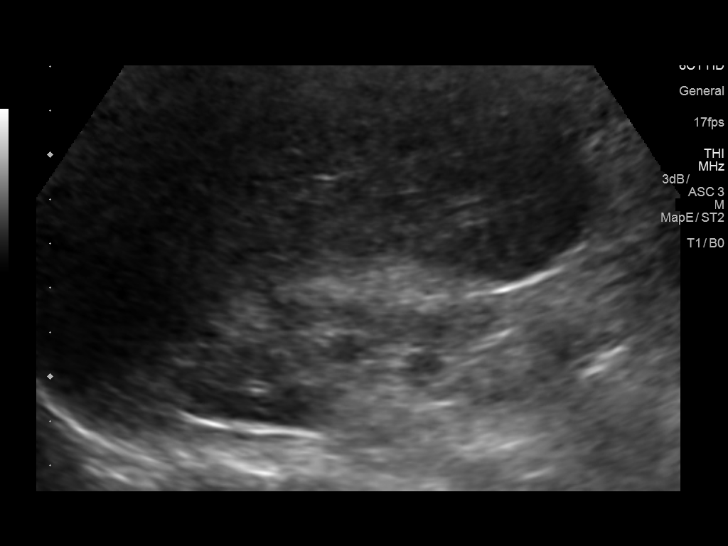
[im 67/89]
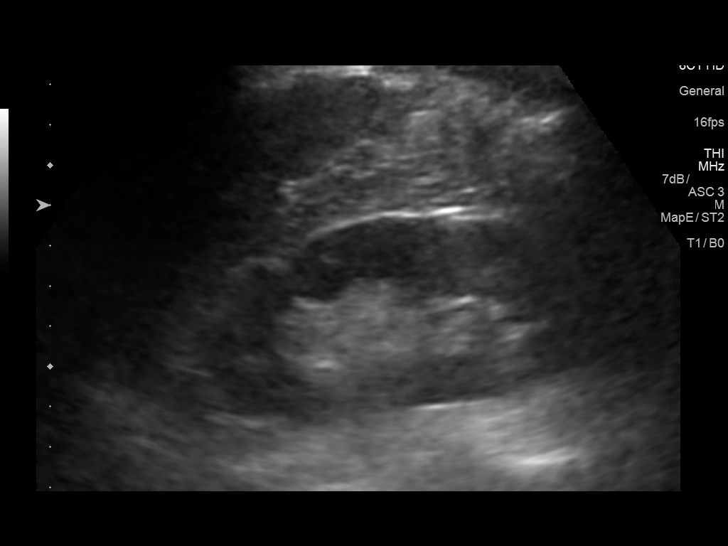
[im 74/89]
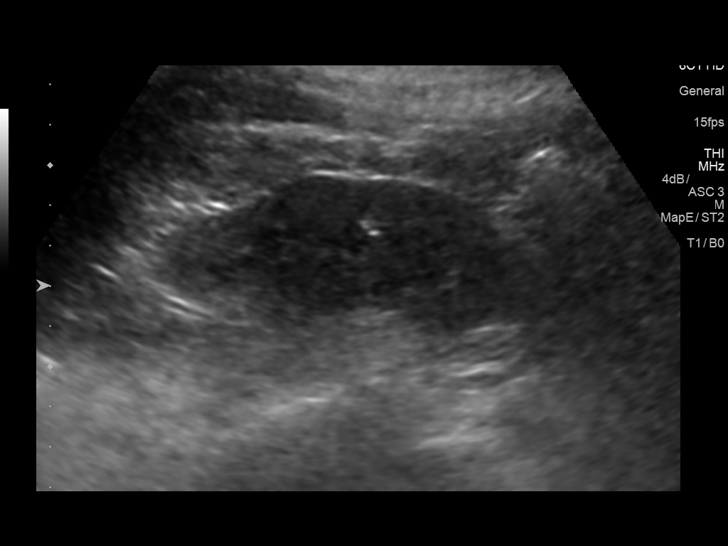
[im 81/89]
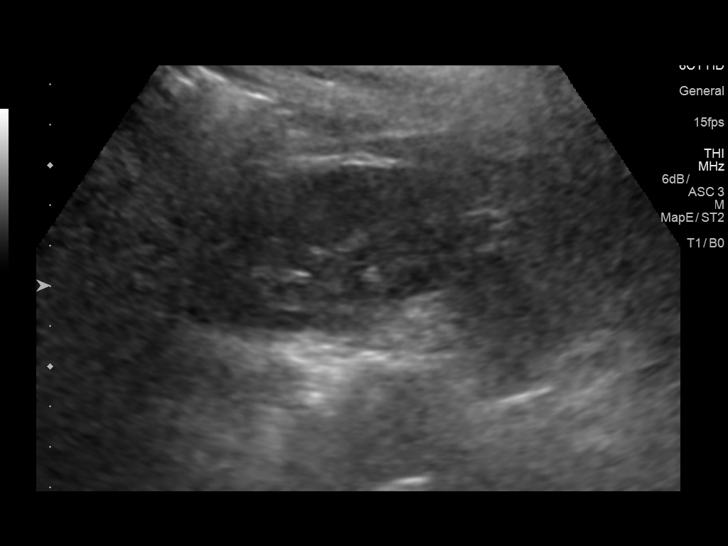
[im 89/89]
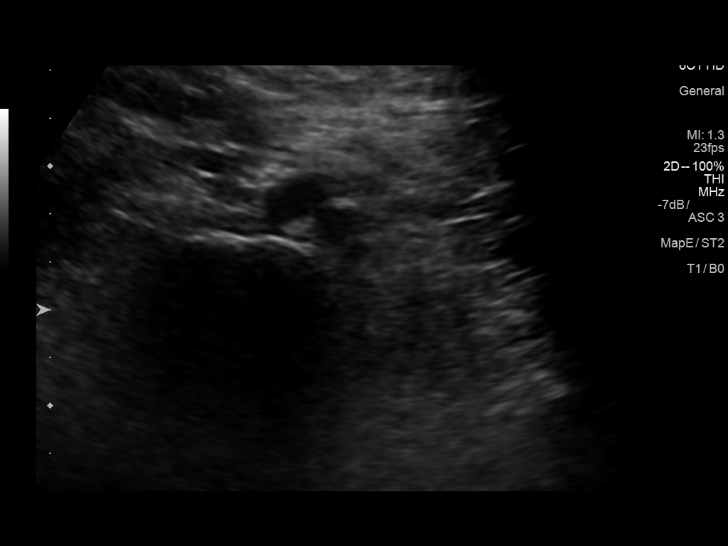

[14 of 25 positions shown; findings below may reference images not displayed]

FINDINGS: Gallbladder: The gallbladder has previously been resected.

Common bile duct: Diameter: The common bile duct is normal measuring
6.4 mm in diameter.

Liver: The parenchyma of the liver is inhomogeneous suggesting
possible mild fatty infiltration. No focal hepatic abnormality is
seen. Portal vein is patent on color Doppler imaging with normal
direction of blood flow towards the liver.

IVC: No abnormality visualized.

Pancreas: Portions of the head and tail of the pancreas are obscured
by bowel gas.

Spleen: The spleen is unremarkable measuring 5.9 cm.

Right Kidney: Length: 9.7 cm..  No hydronephrosis is seen.

Left Kidney: Length: 10.6 cm..  No hydronephrosis is noted.

Abdominal aorta: The abdominal aorta is normal in caliber.

Other findings: None.
IMPRESSION: 1. Somewhat inhomogeneous parenchyma of the liver suggesting
possible fatty infiltration. Correlate with LFTs. No focal
abnormality.
2. No hydronephrosis.
3. Portions of the head and tail of the pancreas are obscured by
bowel gas.

## 2019-10-26 ENCOUNTER — Other Ambulatory Visit: Payer: Self-pay | Admitting: *Deleted

## 2019-10-27 ENCOUNTER — Inpatient Hospital Stay: Payer: PPO | Attending: Hematology & Oncology

## 2019-10-27 ENCOUNTER — Inpatient Hospital Stay: Payer: PPO | Admitting: Hematology & Oncology

## 2019-10-28 DIAGNOSIS — M25552 Pain in left hip: Secondary | ICD-10-CM | POA: Diagnosis not present

## 2019-11-07 ENCOUNTER — Other Ambulatory Visit: Payer: Self-pay | Admitting: Cardiovascular Disease

## 2019-11-07 NOTE — Telephone Encounter (Signed)
*  STAT* If patient is at the pharmacy, call can be transferred to refill team.   1. Which medications need to be refilled? (please list name of each medication and dose if known) metoprolol succinate (TOPROL-XL) 25 MG 24 hr tablet  2. Which pharmacy/location (including street and city if local pharmacy) is medication to be sent to? [place], [place]  3. Do they need a 30 day or 90 day supply? 90   Patient states she needs more than 45 tablets.  Patient out of medication

## 2019-11-08 MED ORDER — METOPROLOL SUCCINATE ER 25 MG PO TB24: ORAL_TABLET | ORAL | 3 refills | Status: DC

## 2019-11-17 ENCOUNTER — Other Ambulatory Visit: Payer: Self-pay | Admitting: Obstetrics & Gynecology

## 2019-11-17 DIAGNOSIS — Z1231 Encounter for screening mammogram for malignant neoplasm of breast: Secondary | ICD-10-CM

## 2019-11-19 IMAGING — US ULTRASOUND ABDOMEN COMPLETE
1 series · 14 of 25 positions shown · non-contrast
Comparison: CT abdomen and pelvis 10/22/2017

CLINICAL DATA: Hepatomegaly, history irritable bowel syndrome,
rheumatoid arthritis, lupus

EXAM:
ABDOMEN ULTRASOUND COMPLETE

[Series 1: ultrasound abdomen complete · 0.20mm/px · 14 of 69 slices shown]
[im 1/69]
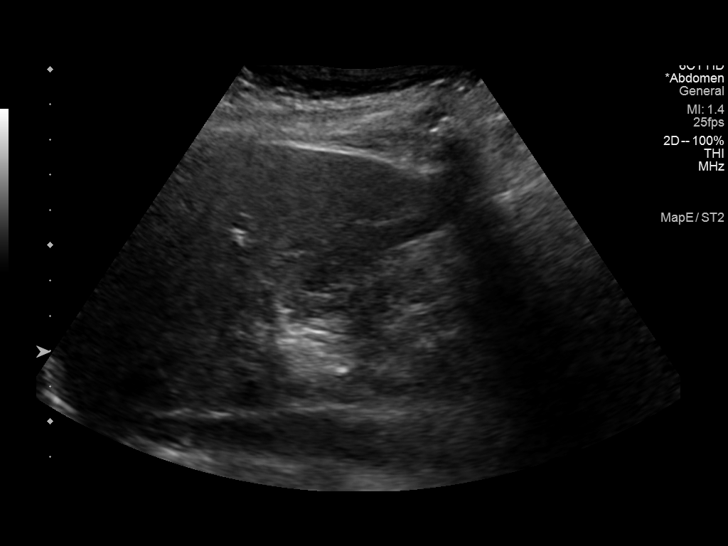
[im 6/69]
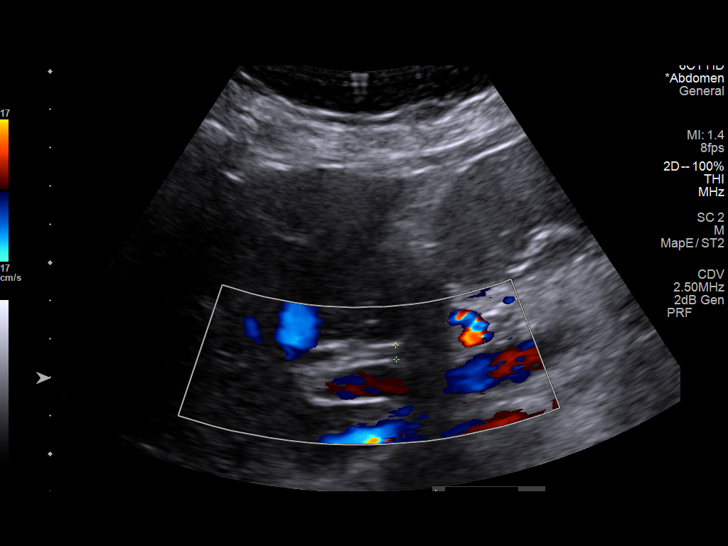
[im 12/69]
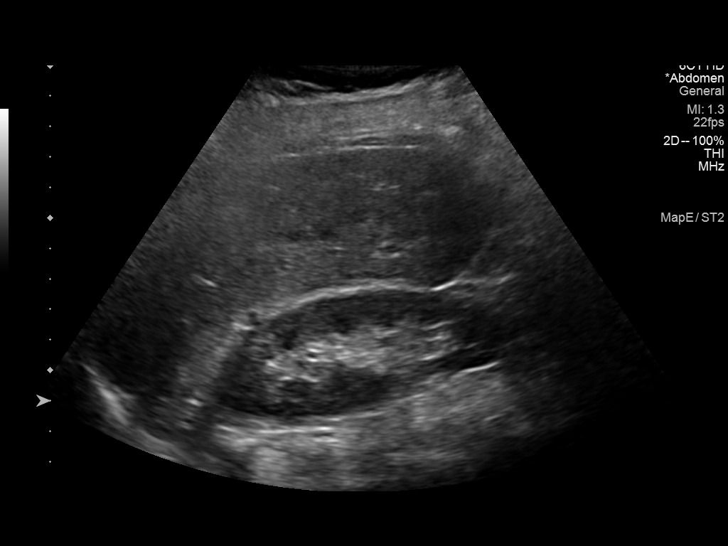
[im 18/69]
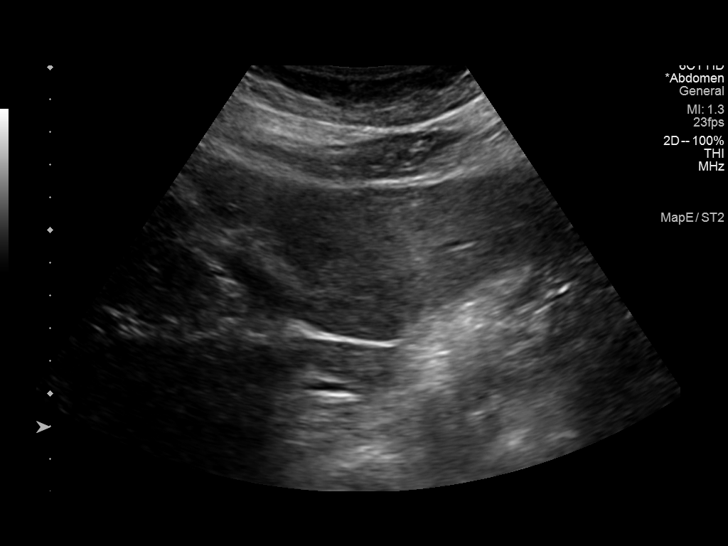
[im 23/69]
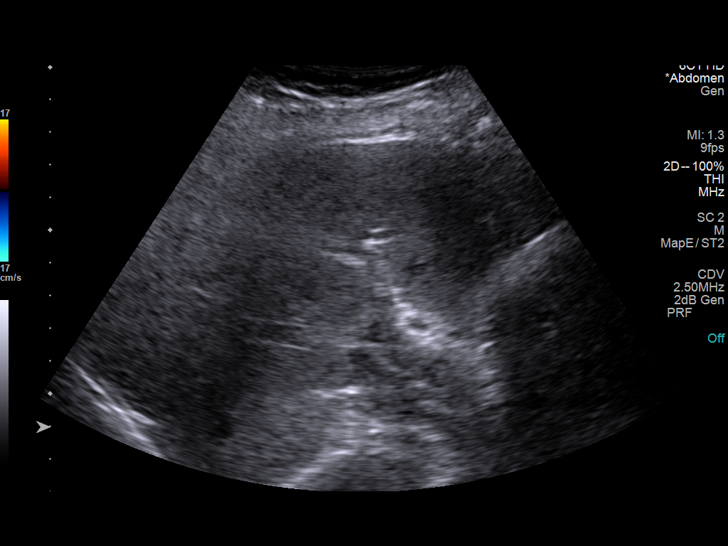
[im 26/69]
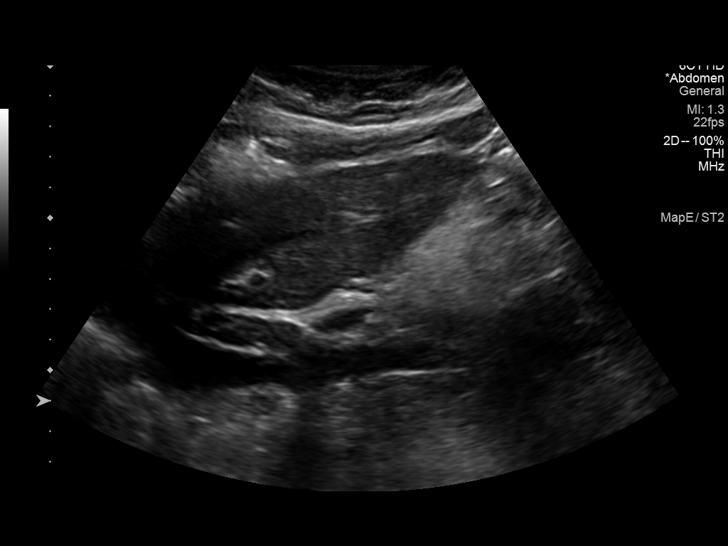
[im 32/69]
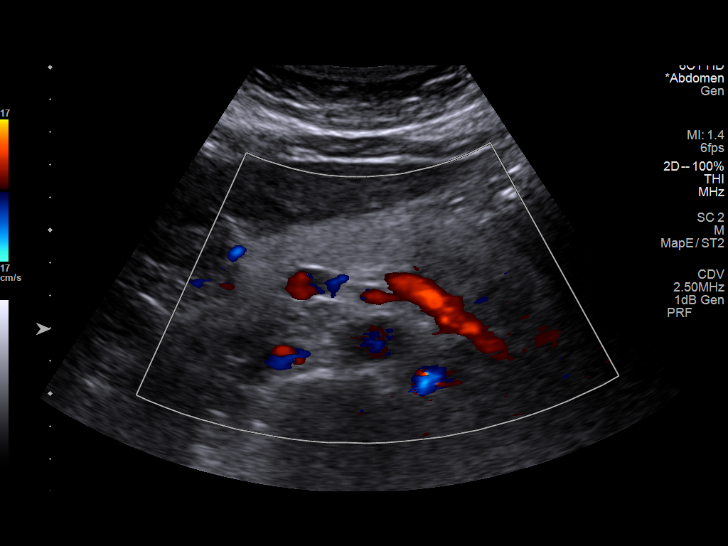
[im 37/69]
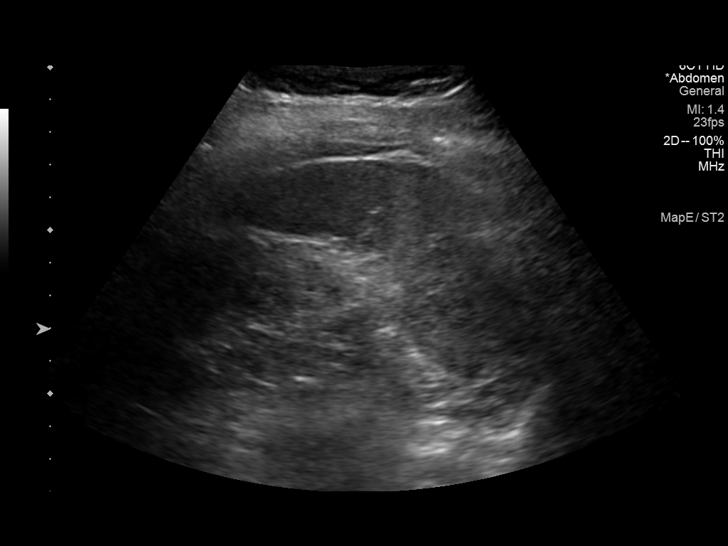
[im 43/69]
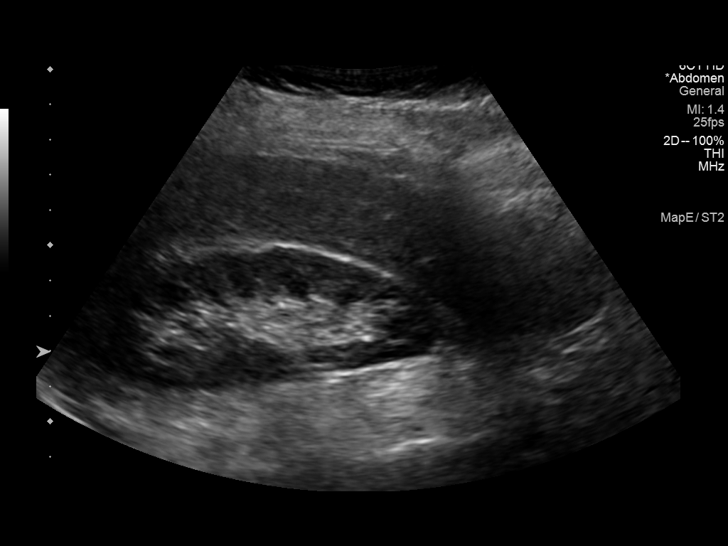
[im 46/69]
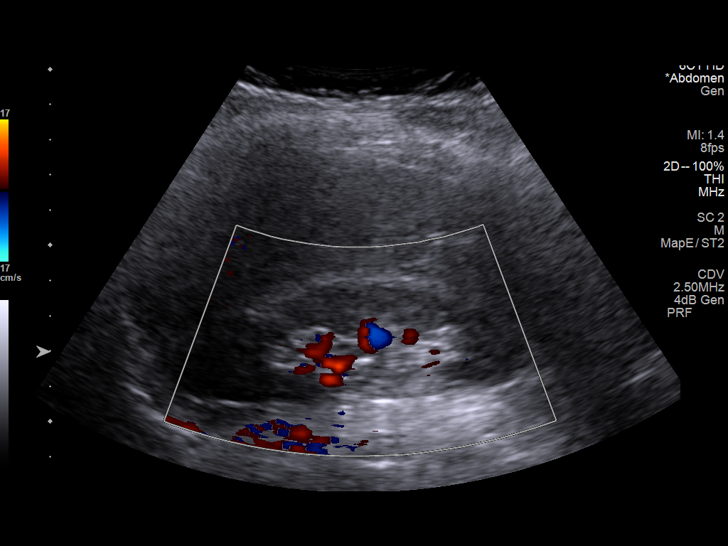
[im 52/69]
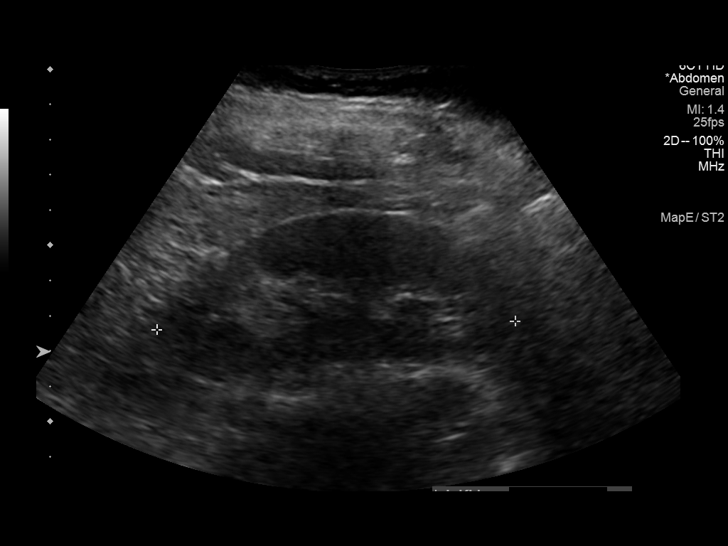
[im 57/69]
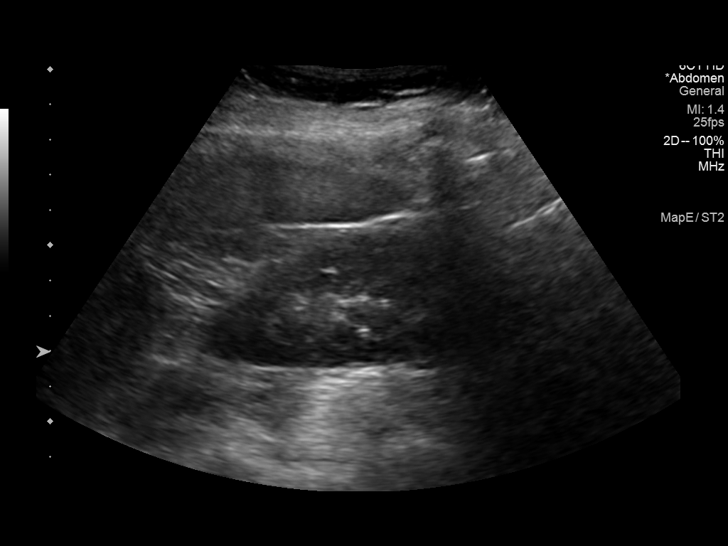
[im 63/69]
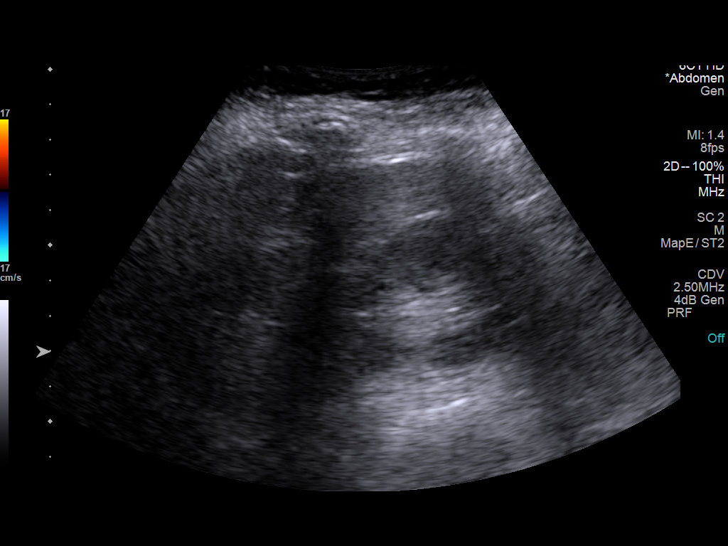
[im 69/69]
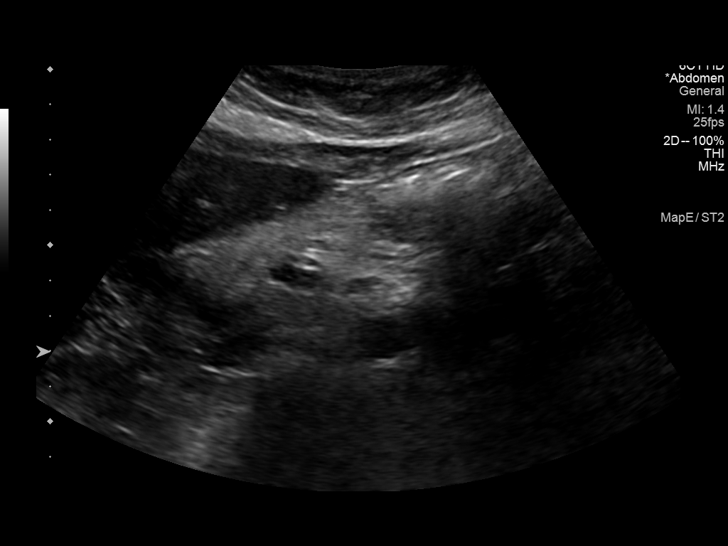

[14 of 25 positions shown; findings below may reference images not displayed]

FINDINGS: Gallbladder: Surgically absent

Common bile duct: Diameter: 4 mm diameter, normal

Liver: Upper normal echogenicity. No mass or nodularity. Appears
normal in size and morphology. Portal vein is patent on color
Doppler imaging with normal direction of blood flow towards the
liver.

IVC: Normal appearance

Pancreas: Normal appearance

Spleen: Normal appearance, 8.8 cm length

Right Kidney: Length: 10.4 cm. Normal morphology without mass or
hydronephrosis.

Left Kidney: Length: 10.2 cm. Normal morphology without mass or
hydronephrosis.

Abdominal aorta: Normal caliber

Other findings: No free fluid
IMPRESSION: Post cholecystectomy.

Otherwise negative exam.

## 2019-11-29 ENCOUNTER — Ambulatory Visit
Admission: RE | Admit: 2019-11-29 | Discharge: 2019-11-29 | Disposition: A | Discharge status: Home / Self Care | Payer: PPO | Source: Ambulatory Visit | Attending: Obstetrics & Gynecology | Admitting: Obstetrics & Gynecology

## 2019-11-29 ENCOUNTER — Other Ambulatory Visit: Payer: Self-pay

## 2019-11-29 DIAGNOSIS — M25552 Pain in left hip: Secondary | ICD-10-CM | POA: Diagnosis not present

## 2019-11-29 DIAGNOSIS — Z1231 Encounter for screening mammogram for malignant neoplasm of breast: Secondary | ICD-10-CM

## 2019-11-29 DIAGNOSIS — M1612 Unilateral primary osteoarthritis, left hip: Secondary | ICD-10-CM | POA: Diagnosis not present

## 2019-11-30 ENCOUNTER — Telehealth: Payer: Self-pay

## 2019-11-30 NOTE — Telephone Encounter (Signed)
   [facility] Pre-operative Risk Assessment    HEARTCARE STAFF: - Please ensure there is not already an duplicate clearance open for this procedure. - Under Visit Info/Reason for Call, type in Other and utilize the format Clearance MM/DD/YY or Clearance TBD. Do not use dashes or single digits. - If request is for dental extraction, please clarify the # of teeth to be extracted.  Request for surgical clearance:  1. What type of surgery is being performed? Left Total Hip Arthroplasty   2. When is this surgery scheduled? 12/21/2019  3. What type of clearance is required (medical clearance vs. Pharmacy clearance to hold med vs. Both)? Medical  4. Are there any medications that need to be held prior to surgery and how long?   5. Practice name and name of physician performing surgery? [facility], Dr. Pilar Plate Aluisio  6. What is the office phone number? [phone]   7.   What is the office fax number? [phone], Attn: Glendale Chard  8.   Anesthesia type (None, local, MAC, general) ?  Choice    Lori [name] 11/30/2019, 4:21 PM  _________________________________________________________________   (provider comments below)

## 2019-12-01 NOTE — Telephone Encounter (Signed)
   Primary Cardiologist: Shelva Majestic, MD  Chart reviewed as part of pre-operative protocol coverage. Patient was contacted 12/01/2019 in reference to pre-operative risk assessment for pending surgery as outlined below.  Lori [name] was last seen on 09/12/2019 by Dr. Claiborne Billings.  Since that day, Lori [name] has done well without chest pain or worsening dyspnea. Her palpitation has improved as well. Functional ability is limited by hip pain, however she does not have a h/o CAD and needs this surgery in order to ambulate more. Last EKG obtained in Apr 2021 did not show significant ischemic changes.   Therefore, based on ACC/AHA guidelines, the patient would be at acceptable risk for the planned procedure without further cardiovascular testing.   I will route this recommendation to the requesting party via Epic fax function and remove from pre-op pool. Please call with questions.  [place], [place] 12/01/2019, 5:16 PM

## 2019-12-02 ENCOUNTER — Other Ambulatory Visit: Payer: PPO

## 2019-12-06 ENCOUNTER — Telehealth: Payer: Self-pay | Admitting: Cardiovascular Disease

## 2019-12-06 DIAGNOSIS — R7301 Impaired fasting glucose: Secondary | ICD-10-CM | POA: Diagnosis not present

## 2019-12-06 DIAGNOSIS — E785 Hyperlipidemia, unspecified: Secondary | ICD-10-CM | POA: Diagnosis not present

## 2019-12-06 DIAGNOSIS — M25552 Pain in left hip: Secondary | ICD-10-CM | POA: Diagnosis not present

## 2019-12-06 DIAGNOSIS — M810 Age-related osteoporosis without current pathological fracture: Secondary | ICD-10-CM | POA: Diagnosis not present

## 2019-12-06 DIAGNOSIS — E039 Hypothyroidism, unspecified: Secondary | ICD-10-CM | POA: Diagnosis not present

## 2019-12-06 DIAGNOSIS — Z01818 Encounter for other preprocedural examination: Secondary | ICD-10-CM | POA: Diagnosis not present

## 2019-12-06 DIAGNOSIS — R002 Palpitations: Secondary | ICD-10-CM | POA: Diagnosis not present

## 2019-12-06 DIAGNOSIS — F39 Unspecified mood [affective] disorder: Secondary | ICD-10-CM | POA: Diagnosis not present

## 2019-12-06 NOTE — Telephone Encounter (Signed)
New Message  Claiborne Billings from [facility] is calling in to speak with [place]. Transferred to [place].

## 2019-12-06 NOTE — Telephone Encounter (Signed)
Incoming call from [place] with [facility] regarding surgical clearance. Claiborne Billings states that pt is being seen in [place] office by Joellen Jersey the NP and they had asked cardiology to clear the pt as well. Notified that Almyra Deforest PA provided clearance and sent via epic fax. Claiborne Billings reports that they do not have Epic and will need a paper copy. Notified I would print the note and fax to their office at [phone]. Claiborne Billings thankful for the call and needed no other assistance at this time.

## 2019-12-07 ENCOUNTER — Encounter: Payer: Self-pay | Admitting: Family

## 2019-12-07 ENCOUNTER — Inpatient Hospital Stay (HOSPITAL_BASED_OUTPATIENT_CLINIC_OR_DEPARTMENT_OTHER): Payer: PPO | Admitting: Family

## 2019-12-07 ENCOUNTER — Other Ambulatory Visit: Payer: Self-pay

## 2019-12-07 ENCOUNTER — Inpatient Hospital Stay: Payer: PPO | Attending: Hematology & Oncology

## 2019-12-07 DIAGNOSIS — Z79899 Other long term (current) drug therapy: Secondary | ICD-10-CM | POA: Diagnosis not present

## 2019-12-07 LAB — CBC WITH DIFFERENTIAL (CANCER CENTER ONLY)
Abs Immature Granulocytes: 0.02 10*3/uL (ref 0.00–0.07)
Basophils Absolute: 0.1 10*3/uL (ref 0.0–0.1)
Basophils Relative: 1 %
Eosinophils Absolute: 0.1 10*3/uL (ref 0.0–0.5)
Eosinophils Relative: 1 %
HCT: 43.1 % (ref 36.0–46.0)
Hemoglobin: 14.5 g/dL (ref 12.0–15.0)
Immature Granulocytes: 0 %
Lymphocytes Relative: 38 %
Lymphs Abs: 2.9 10*3/uL (ref 0.7–4.0)
MCH: 32.8 pg (ref 26.0–34.0)
MCHC: 33.6 g/dL (ref 30.0–36.0)
MCV: 97.5 fL (ref 80.0–100.0)
Monocytes Absolute: 0.8 10*3/uL (ref 0.1–1.0)
Monocytes Relative: 10 %
Neutro Abs: 3.8 10*3/uL (ref 1.7–7.7)
Neutrophils Relative %: 50 %
Platelet Count: 219 10*3/uL (ref 150–400)
RBC: 4.42 MIL/uL (ref 3.87–5.11)
RDW: 12.8 % (ref 11.5–15.5)
WBC Count: 7.6 10*3/uL (ref 4.0–10.5)
nRBC: 0 % (ref 0.0–0.2)

## 2019-12-07 LAB — CMP (CANCER CENTER ONLY)
ALT: 32 U/L (ref 0–44)
AST: 30 U/L (ref 15–41)
Albumin: 4.7 g/dL (ref 3.5–5.0)
Alkaline Phosphatase: 79 U/L (ref 38–126)
Anion gap: 7 (ref 5–15)
BUN: 15 mg/dL (ref 8–23)
CO2: 28 mmol/L (ref 22–32)
Calcium: 10.5 mg/dL — ABNORMAL HIGH (ref 8.9–10.3)
Chloride: 101 mmol/L (ref 98–111)
Creatinine: 0.67 mg/dL (ref 0.44–1.00)
GFR, Est AFR Am: >60 mL/min (ref >60)
GFR, Estimated: >60 mL/min (ref >60)
Glucose, Bld: 99 mg/dL (ref 70–99)
Potassium: 4.2 mmol/L (ref 3.5–5.1)
Sodium: 136 mmol/L (ref 135–145)
Total Bilirubin: 0.6 mg/dL (ref 0.3–1.2)
Total Protein: 6.9 g/dL (ref 6.5–8.1)

## 2019-12-07 NOTE — Progress Notes (Signed)
Hematology and Oncology Follow Up Visit  Lori [name] [id number] 1945-06-02 74 y.o. 12/07/2019   Principle Diagnosis:  Hemochromatosis - heterozygous for the C282Y mutation  Current Therapy:        Phlebotomy as indicated to keep ferritin <35(changed per patient request 06/24/17)   Interim History:  Lori [name] is here today for follow-up. She is doing fairly well but states that she is going to have a left hip replacement with Dr. Wynelle Link on 12/21/2019.  She has had diarrhea off and on for the last few weeks.   No episodes of bleeding. No bruising or petechiae.  No fever, chills, n/v, cough, rash, dizziness, chest pain, abdominal pain or changes in bladder habits.  No swelling, numbness or tingling in her extremities.  No falls or syncopal episodes to report.  She has maintained a good appetite and is staying well hydrated. Her weight is stable.   ECOG Performance Status: 1 - Symptomatic but completely ambulatory  Medications:  Allergies as of 12/07/2019      Reactions   Albumin (human)    Pt stated, "I am not allergic to this and don't know what this means"; 04/27/17   Amoxicillin Nausea And Vomiting   Codeine Nausea Only   Erythromycin Nausea Only, Other (See Comments)   Iodinated Diagnostic Agents Other (See Comments)   Keflex [cephalexin] Itching   Meperidine    Pt stated, "I don't know what this is"; 04/27/17   Metoclopramide Hcl    Other Other (See Comments)   Beef Chicken Dairy Eggs Pt stated, "My body can't process protein; it makes me sick"   Percocet [oxycodone-acetaminophen]    Made pt sick    Shellfish Allergy Other (See Comments)   Pt stated, "I am not allergic to this anymore"; 04/27/17 Pt stated, "I am not allergic to this anymore"; 04/27/17   Shellfish-derived Products    Oxycodone Nausea And Vomiting   Pt stated, "I do not know if I am allergic to this"; 04/27/17      Medication List       Accurate as of December 07, 2019  3:12 PM. If you have  any questions, ask your nurse or doctor.        amiloride-hydrochlorothiazide 5-50 MG tablet Commonly known as: MODURETIC Take 0.5 tablets by mouth daily.   dextroamphetamine 5 MG 24 hr capsule Commonly known as: DEXEDRINE SPANSULE Take 5 mg by mouth daily.   doxycycline 100 MG tablet Commonly known as: VIBRA-TABS Take 100 mg by mouth daily.   fish oil-omega-3 fatty acids 1000 MG capsule Take 1 g by mouth daily.   fluconazole 200 MG tablet Commonly known as: DIFLUCAN Take 100 mg by mouth daily as needed (yeast).   hydroxychloroquine 200 MG tablet Commonly known as: PLAQUENIL Take 200 mg by mouth daily.   MELATONIN PO Take 2 mg by mouth at bedtime.   metoprolol succinate 25 MG 24 hr tablet Commonly known as: TOPROL-XL Take 1 Tablet in AM and take 1/2 tablet in PM What changed:   how much to take  how to take this  when to take this  additional instructions   potassium chloride SA 20 MEQ tablet Commonly known as: KLOR-CON Take 20 mEq by mouth daily.   PRASTERONE (DHEA) PO Take 28 mg by mouth daily.   PROBIOTIC DAILY PO Take 1 capsule by mouth daily.   progesterone 100 MG capsule Commonly known as: PROMETRIUM Take 100 mg by mouth daily.   QUERCETIN PO Take 1 capsule  by mouth daily.   thyroid 60 MG tablet Commonly known as: ARMOUR Take 60 mg by mouth daily before breakfast.   thyroid 15 MG tablet Commonly known as: ARMOUR Take 15 mg by mouth 3 (three) times a week.   vitamin C 1000 MG tablet Take 1,000 mg by mouth daily.   VITAMIN D PO Take 1 tablet by mouth daily.   zinc gluconate 50 MG tablet Take 50 mg by mouth daily.       Allergies:  Allergies  Allergen Reactions  . Albumin (Human)     Pt stated, "I am not allergic to this and don't know what this means"; 04/27/17  . Amoxicillin Nausea And Vomiting  . Codeine Nausea Only  . Erythromycin Nausea Only and Other (See Comments)  . Iodinated Diagnostic Agents Other (See Comments)  .  Keflex [Cephalexin] Itching  . Meperidine     Pt stated, "I don't know what this is"; 04/27/17  . Metoclopramide Hcl   . Other Other (See Comments)    Beef Chicken Dairy Eggs  Pt stated, "My body can't process protein; it makes me sick"  . Percocet [Oxycodone-Acetaminophen]     Made pt sick   . Shellfish Allergy Other (See Comments)    Pt stated, "I am not allergic to this anymore"; 04/27/17 Pt stated, "I am not allergic to this anymore"; 04/27/17  . Shellfish-Derived Products   . Oxycodone Nausea And Vomiting    Pt stated, "I do not know if I am allergic to this"; 04/27/17    Past Medical History, Surgical history, Social history, and Family History were reviewed and updated.  Review of Systems: All other 10 point review of systems is negative.   Physical Exam:  height is 5' 6.5" (1.689 m) and weight is 154 lb 3.2 oz (69.9 kg). Her oral temperature is 98.4 F (36.9 C). Her blood pressure is 125/61 and her pulse is 74. Her respiration is 16 and oxygen saturation is 100%.   Wt Readings from Last 3 Encounters:  12/07/19 154 lb 3.2 oz (69.9 kg)  09/12/19 155 lb 12.8 oz (70.7 kg)  10/14/18 156 lb (70.8 kg)    Ocular: Sclerae unicteric, pupils equal, round and reactive to light Ear-nose-throat: Oropharynx clear, dentition fair Lymphatic: No cervical or supraclavicular adenopathy Lungs no rales or rhonchi, good excursion bilaterally Heart regular rate and rhythm, no murmur appreciated Abd soft, nontender, positive bowel sounds, no liver or spleen tip palpated on exam, no fluid wave  MSK no focal spinal tenderness, no joint edema Neuro: non-focal, well-oriented, appropriate affect Breasts: Deferred   Lab Results  Component Value Date   WBC 7.6 12/07/2019   HGB 14.5 12/07/2019   HCT 43.1 12/07/2019   MCV 97.5 12/07/2019   PLT 219 12/07/2019   Lab Results  Component Value Date   FERRITIN 13 07/18/2019   IRON 81 07/18/2019   TIBC 280 07/18/2019   UIBC 199 07/18/2019    IRONPCTSAT 29 07/18/2019   Lab Results  Component Value Date   RBC 4.42 12/07/2019   No results found for: KPAFRELGTCHN, LAMBDASER, KAPLAMBRATIO No results found for: IGGSERUM, IGA, IGMSERUM No results found for: Ronnald Ramp, A1GS, A2GS, Tillman Sers, SPEI   Chemistry      Component Value Date/Time   NA 136 12/07/2019 1307   NA 143 03/11/2017 1404   NA 139 07/02/2016 1139   K 4.2 12/07/2019 1307   K 3.9 03/11/2017 1404   K 4.1 07/02/2016 1139  CL 101 12/07/2019 1307   CL 105 03/11/2017 1404   CO2 28 12/07/2019 1307   CO2 31 03/11/2017 1404   CO2 26 07/02/2016 1139   BUN 15 12/07/2019 1307   BUN 15 03/11/2017 1404   BUN 20.3 07/02/2016 1139   CREATININE 0.67 12/07/2019 1307   CREATININE 0.7 03/11/2017 1404   CREATININE 0.8 07/02/2016 1139      Component Value Date/Time   CALCIUM 10.5 (H) 12/07/2019 1307   CALCIUM 9.4 03/11/2017 1404   CALCIUM 10.0 07/02/2016 1139   ALKPHOS 79 12/07/2019 1307   ALKPHOS 69 03/11/2017 1404   ALKPHOS 87 07/02/2016 1139   AST 30 12/07/2019 1307   AST 27 07/02/2016 1139   ALT 32 12/07/2019 1307   ALT 32 03/11/2017 1404   ALT 34 07/02/2016 1139   BILITOT 0.6 12/07/2019 1307   BILITOT 0.55 07/02/2016 1139       Impression and Plan: Lori [name] is a pleasant 74 yo caucasian female with hemochromatosis, heterozygous for the C282Y mutations.  We will see what her iron studies show and schedule phlebotomy if needed.  She has her labs checked every 3 months with her PCP and contacts [place] if her iron studies are high for phlebotomy. We will continue to follow-up annually. She will contact our office with any questions or concerns. We can certainly see her sooner if need be.   Lori Peace, NP 7/14/20213:12 PM

## 2019-12-08 DIAGNOSIS — Z800 Family history of malignant neoplasm of digestive organs: Secondary | ICD-10-CM | POA: Diagnosis not present

## 2019-12-08 DIAGNOSIS — Z87898 Personal history of other specified conditions: Secondary | ICD-10-CM | POA: Diagnosis not present

## 2019-12-08 DIAGNOSIS — Z2821 Immunization not carried out because of patient refusal: Secondary | ICD-10-CM | POA: Diagnosis not present

## 2019-12-08 DIAGNOSIS — Z86010 Personal history of colonic polyps: Secondary | ICD-10-CM | POA: Diagnosis not present

## 2019-12-08 LAB — IRON AND TIBC
Iron: 246 ug/dL — ABNORMAL HIGH (ref 41–142)
Saturation Ratios: 101 % — ABNORMAL HIGH (ref 21–57)
TIBC: 243 ug/dL (ref 236–444)
UIBC: UNDETERMINED ug/dL (ref 120–384)

## 2019-12-08 LAB — FERRITIN: Ferritin: 39 ng/mL (ref 11–307)

## 2019-12-12 ENCOUNTER — Inpatient Hospital Stay: Payer: PPO

## 2019-12-12 ENCOUNTER — Other Ambulatory Visit: Payer: Self-pay

## 2019-12-12 NOTE — Patient Instructions (Signed)
Therapeutic Phlebotomy Therapeutic phlebotomy is the planned removal of blood from a person's body for the purpose of treating a medical condition. The procedure is similar to donating blood. Usually, about a pint (470 mL, or 0.47 L) of blood is removed. The average adult has 9-12 pints (4.3-5.7 L) of blood in the body. Therapeutic phlebotomy may be used to treat the following medical conditions:  Hemochromatosis. This is a condition in which the blood contains too much iron.  Polycythemia vera. This is a condition in which the blood contains too many red blood cells.  Porphyria cutanea tarda. This is a disease in which an important part of hemoglobin is not made properly. It results in the buildup of abnormal amounts of porphyrins in the body.  Sickle cell disease. This is a condition in which the red blood cells form an abnormal crescent shape rather than a round shape. Tell a health care provider about:  Any allergies you have.  All medicines you are taking, including vitamins, herbs, eye drops, creams, and over-the-counter medicines.  Any problems you or family members have had with anesthetic medicines.  Any blood disorders you have.  Any surgeries you have had.  Any medical conditions you have.  Whether you are pregnant or may be pregnant. What are the risks? Generally, this is a safe procedure. However, problems may occur, including:  Nausea or light-headedness.  Low blood pressure (hypotension).  Soreness, bleeding, swelling, or bruising at the needle insertion site.  Infection. What happens before the procedure?  Follow instructions from your health care provider about eating or drinking restrictions.  Ask your health care provider about: ? Changing or stopping your regular medicines. This is especially important if you are taking diabetes medicines or blood thinners (anticoagulants). ? Taking medicines such as aspirin and ibuprofen. These medicines can thin your  blood. Do not take these medicines unless your health care provider tells you to take them. ? Taking over-the-counter medicines, vitamins, herbs, and supplements.  Wear clothing with sleeves that can be raised above the elbow.  Plan to have someone take you home from the hospital or clinic.  You may have a blood sample taken.  Your blood pressure, pulse rate, and breathing rate will be measured. What happens during the procedure?   To lower your risk of infection: ? Your health care team will wash or sanitize their hands. ? Your skin will be cleaned with an antiseptic.  You may be given a medicine to numb the area (local anesthetic).  A tourniquet will be placed on your arm.  A needle will be inserted into one of your veins.  Tubing and a collection bag will be attached to that needle.  Blood will flow through the needle and tubing into the collection bag.  The collection bag will be placed lower than your arm to allow gravity to help the flow of blood into the bag.  You may be asked to open and close your hand slowly and continually during the entire collection.  After the specified amount of blood has been removed from your body, the collection bag and tubing will be clamped.  The needle will be removed from your vein.  Pressure will be held on the site of the needle insertion to stop the bleeding.  A bandage (dressing) will be placed over the needle insertion site. The procedure may vary among health care providers and hospitals. What happens after the procedure?  Your blood pressure, pulse rate, and breathing rate will be   measured after the procedure.  You will be encouraged to drink fluids.  Your recovery will be assessed and monitored.  You can return to your normal activities as told by your health care provider. Summary  Therapeutic phlebotomy is the planned removal of blood from a person's body for the purpose of treating a medical condition.  Therapeutic  phlebotomy may be used to treat hemochromatosis, polycythemia vera, porphyria cutanea tarda, or sickle cell disease.  In the procedure, a needle is inserted and about a pint (470 mL, or 0.47 L) of blood is removed. The average adult has 9-12 pints (4.3-5.7 L) of blood in the body.  This is generally a safe procedure, but it can sometimes cause problems such as nausea, light-headedness, or low blood pressure (hypotension). This information is not intended to replace advice given to you by your health care provider. Make sure you discuss any questions you have with your health care provider. Document Revised: 05/28/2017 Document Reviewed: 05/28/2017 Elsevier Patient Education  2020 Elsevier Inc.  

## 2019-12-12 NOTE — Progress Notes (Signed)
Sudie Bailey presents today for phlebotomy per MD orders. Phlebotomy procedure started at 1457 and ended at 87 by Jarrett Soho, RN 75 grams removed from rt [facility] using 20g IV catheter before clotting off.  Second attempt at phlebotomy started at 1510 and ended at 1526 via 20 gauge to right wrist with 465g removed.  Patient declines 30 minute post observation.  Patient tolerated procedure well. IV needle removed intact.

## 2019-12-15 NOTE — Patient Instructions (Addendum)
DUE TO COVID-19 ONLY ONE VISITOR IS ALLOWED TO COME WITH YOU AND STAY IN THE WAITING ROOM ONLY DURING PRE OP AND PROCEDURE DAY OF SURGERY. THE 1 VISITOR MAY VISIT WITH YOU AFTER SURGERY IN YOUR PRIVATE ROOM DURING VISITING HOURS ONLY!  YOU NEED TO HAVE A COVID 19 TEST ON 12-17-19 @ 12:20 PM, THIS TEST MUST BE DONE BEFORE SURGERY, COME  [address] [place] , [address].  ([place]) ONCE YOUR COVID TEST IS COMPLETED, PLEASE BEGIN THE QUARANTINE INSTRUCTIONS AS OUTLINED IN YOUR HANDOUT.                Mickenzie Stolar  12/15/2019   Your procedure is scheduled on: 12-21-19   Report to [facility] Main  Entrance    Report to Admitting at 6:40 AM     Call this number if you have problems the morning of surgery [phone]    Remember: AFTER MIDNIGHT, NOTHING BY MOUTH EXCEPT CLEAR LIQUIDS UNTIL 6:10 AM. PLEASE FINISH ENSURE DRINK AT 6:10  AM .     CLEAR LIQUID DIET   Foods Allowed                                                                     Foods Excluded  Coffee and tea, regular and decaf                             liquids that you cannot  Plain Jell-O any favor except red or purple                                           see through such as: Fruit ices (not with fruit pulp)                                     milk, soups, orange juice  Iced Popsicles                                    All solid food Carbonated beverages, regular and diet                                    Cranberry, grape and apple juices Sports drinks like Gatorade Lightly seasoned clear broth or consume(fat free) Sugar, honey syrup   _____________________________________________________________________    Take these medicines the morning of surgery with A SIP OF WATER: Metoprolol Succinate (Toprol-XL), and Thyroid (Armour)  BRUSH YOUR TEETH MORNING OF SURGERY AND RINSE YOUR MOUTH OUT, NO CHEWING GUM CANDY OR MINTS.   DO NOT TAKE ANY DIABETIC MEDICATIONS DAY OF  YOUR SURGERY                               You may not have any metal on your body including hair pins  and              piercings     Do not wear jewelry, make-up, lotions, powders or perfumes, deodorant              Do not wear nail polish on your fingernails.  Do not shave  48 hours prior to surgery.                Do not bring valuables to the hospital. [place].  Contacts, dentures or bridgework may not be worn into surgery.  You may bring a small overnight bag     Special Instructions: N/A              Please read over the following fact sheets you were given: _____________________________________________________________________             [facility] - Preparing for Surgery Before surgery, you can play an important role.  Because skin is not sterile, your skin needs to be as free of germs as possible.  You can reduce the number of germs on your skin by washing with CHG (chlorahexidine gluconate) soap before surgery.  CHG is an antiseptic cleaner which kills germs and bonds with the skin to continue killing germs even after washing. Please DO NOT use if you have an allergy to CHG or antibacterial soaps.  If your skin becomes reddened/irritated stop using the CHG and inform your nurse when you arrive at Short Stay. Do not shave (including legs and underarms) for at least 48 hours prior to the first CHG shower.  You may shave your face/neck. Please follow these instructions carefully:  1.  Shower with CHG Soap the night before surgery and the  morning of Surgery.  2.  If you choose to wash your hair, wash your hair first as usual with your  normal  shampoo.  3.  After you shampoo, rinse your hair and body thoroughly to remove the  shampoo.                           4.  Use CHG as you would any other liquid soap.  You can apply chg directly  to the skin and wash                       Gently with a scrungie or clean washcloth.  5.   Apply the CHG Soap to your body ONLY FROM THE NECK DOWN.   Do not use on face/ open                           Wound or open sores. Avoid contact with eyes, ears mouth and genitals (private parts).                       Wash face,  Genitals (private parts) with your normal soap.             6.  Wash thoroughly, paying special attention to the area where your surgery  will be performed.  7.  Thoroughly rinse your body with warm water from the neck down.  8.  DO NOT shower/wash with your normal soap after using and rinsing off  the CHG Soap.  9.  Pat yourself dry with a clean towel.            10.  Wear clean pajamas.            11.  Place clean sheets on your bed the night of your first shower and do not  sleep with pets. Day of Surgery : Do not apply any lotions/deodorants the morning of surgery.  Please wear clean clothes to the hospital/surgery center.  FAILURE TO FOLLOW THESE INSTRUCTIONS MAY RESULT IN THE CANCELLATION OF YOUR SURGERY PATIENT SIGNATURE_________________________________  NURSE SIGNATURE__________________________________  ________________________________________________________________________   Adam Phenix  An incentive spirometer is a tool that can help keep your lungs clear and active. This tool measures how well you are filling your lungs with each breath. Taking long deep breaths may help reverse or decrease the chance of developing breathing (pulmonary) problems (especially infection) following:  A long period of time when you are unable to move or be active. BEFORE THE PROCEDURE   If the spirometer includes an indicator to show your best effort, your nurse or respiratory therapist will set it to a desired goal.  If possible, sit up straight or lean slightly forward. Try not to slouch.  Hold the incentive spirometer in an upright position. INSTRUCTIONS FOR USE  1. Sit on the edge of your bed if possible, or sit up as far as you can in bed  or on a chair. 2. Hold the incentive spirometer in an upright position. 3. Breathe out normally. 4. Place the mouthpiece in your mouth and seal your lips tightly around it. 5. Breathe in slowly and as deeply as possible, raising the piston or the ball toward the top of the column. 6. Hold your breath for 3-5 seconds or for as long as possible. Allow the piston or ball to fall to the bottom of the column. 7. Remove the mouthpiece from your mouth and breathe out normally. 8. Rest for a few seconds and repeat Steps 1 through 7 at least 10 times every 1-2 hours when you are awake. Take your time and take a few normal breaths between deep breaths. 9. The spirometer may include an indicator to show your best effort. Use the indicator as a goal to work toward during each repetition. 10. After each set of 10 deep breaths, practice coughing to be sure your lungs are clear. If you have an incision (the cut made at the time of surgery), support your incision when coughing by placing a pillow or rolled up towels firmly against it. Once you are able to get out of bed, walk around indoors and cough well. You may stop using the incentive spirometer when instructed by your caregiver.  RISKS AND COMPLICATIONS  Take your time so you do not get dizzy or light-headed.  If you are in pain, you may need to take or ask for pain medication before doing incentive spirometry. It is harder to take a deep breath if you are having pain. AFTER USE  Rest and breathe slowly and easily.  It can be helpful to keep track of a log of your progress. Your caregiver can provide you with a simple table to help with this. If you are using the spirometer at home, follow these instructions: [place] IF:   You are having difficultly using the spirometer.  You have trouble using the spirometer as often as instructed.  Your pain medication is not giving enough relief while using the spirometer.  You develop  fever of 100.5  F (38.1 C) or higher. SEEK IMMEDIATE MEDICAL CARE IF:   You cough up bloody sputum that had not been present before.  You develop fever of 102 F (38.9 C) or greater.  You develop worsening pain at or near the incision site. MAKE SURE YOU:   Understand these instructions.  Will watch your condition.  Will get help right away if you are not doing well or get worse. Document Released: 09/22/2006 Document Revised: 08/04/2011 Document Reviewed: 11/23/2006 ExitCare Patient Information 2014 Exit[employer], [place].   ________________________________________________________________________  WHAT IS A BLOOD TRANSFUSION? Blood Transfusion Information  A transfusion is the replacement of blood or some of its parts. Blood is made up of multiple cells which provide different functions.  Red blood cells carry oxygen and are used for blood loss replacement.  White blood cells fight against infection.  Platelets control bleeding.  Plasma helps clot blood.  Other blood products are available for specialized needs, such as hemophilia or other clotting disorders. BEFORE THE TRANSFUSION  Who gives blood for transfusions?   Healthy volunteers who are fully evaluated to make sure their blood is safe. This is blood bank blood. Transfusion therapy is the safest it has ever been in the practice of medicine. Before blood is taken from a donor, a complete history is taken to make sure that person has no history of diseases nor engages in risky social behavior (examples are intravenous drug use or sexual activity with multiple partners). The donor's travel history is screened to minimize risk of transmitting infections, such as malaria. The donated blood is tested for signs of infectious diseases, such as HIV and hepatitis. The blood is then tested to be sure it is compatible with you in order to minimize the chance of a transfusion reaction. If you or a relative donates blood, this is often done in  anticipation of surgery and is not appropriate for emergency situations. It takes many days to process the donated blood. RISKS AND COMPLICATIONS Although transfusion therapy is very safe and saves many lives, the main dangers of transfusion include:   Getting an infectious disease.  Developing a transfusion reaction. This is an allergic reaction to something in the blood you were given. Every precaution is taken to prevent this. The decision to have a blood transfusion has been considered carefully by your caregiver before blood is given. Blood is not given unless the benefits outweigh the risks. AFTER THE TRANSFUSION  Right after receiving a blood transfusion, you will usually feel much better and more energetic. This is especially true if your red blood cells have gotten low (anemic). The transfusion raises the level of the red blood cells which carry oxygen, and this usually causes an energy increase.  The nurse administering the transfusion will monitor you carefully for complications. HOME CARE INSTRUCTIONS  No special instructions are needed after a transfusion. You may find your energy is better. Speak with your caregiver about any limitations on activity for underlying diseases you may have. SEEK MEDICAL CARE IF:   Your condition is not improving after your transfusion.  You develop redness or irritation at the intravenous (IV) site. SEEK IMMEDIATE MEDICAL CARE IF:  Any of the following symptoms occur over the next 12 hours:  Shaking chills.  You have a temperature by mouth above 102 F (38.9 C), not controlled by medicine.  Chest, back, or muscle pain.  People around you feel you are not acting correctly or are confused.  Shortness of breath  or difficulty breathing.  Dizziness and fainting.  You get a rash or develop hives.  You have a decrease in urine output.  Your urine turns a dark color or changes to pink, red, or brown. Any of the following symptoms occur over  the next 10 days:  You have a temperature by mouth above 102 F (38.9 C), not controlled by medicine.  Shortness of breath.  Weakness after normal activity.  The white part of the eye turns yellow (jaundice).  You have a decrease in the amount of urine or are urinating less often.  Your urine turns a dark color or changes to pink, red, or brown. Document Released: 05/09/2000 Document Revised: 08/04/2011 Document Reviewed: 12/27/2007 [facility] Patient Information 2014 [place], [place].  _______________________________________________________________________

## 2019-12-15 NOTE — Progress Notes (Addendum)
COVID Vaccine Completed: None Date COVID Vaccine completed: COVID vaccine manufacturer: [place]   PCP - Marton Redwood, MD w/ medical clearance on chart dated 12-07-19  Cardiologist - Troy Sine, MD w/ cardiac clearance date 11-30-19 in Epic Telephone Encounter  No stimulator in back  Chest x-ray -  EKG - 09-13-19 Stress Test -  ECHO -  Cardiac Cath -   Sleep Study -  CPAP -   Fasting Blood Sugar -  Checks Blood Sugar _____ times a day  Blood Thinner Instructions: Aspirin Instructions: Last Dose:   ADL's w/o SOB  Anesthesia review:   Patient denies shortness of breath, fever, cough and chest pain at PAT appointment   Patient verbalized understanding of instructions that were given to them at the PAT appointment. Patient was also instructed that they will need to review over the PAT instructions again at home before surgery.

## 2019-12-16 ENCOUNTER — Encounter (HOSPITAL_COMMUNITY): Payer: Self-pay | Admitting: Physician Assistant

## 2019-12-16 ENCOUNTER — Encounter (HOSPITAL_COMMUNITY): Payer: Self-pay

## 2019-12-16 ENCOUNTER — Other Ambulatory Visit: Payer: Self-pay

## 2019-12-16 ENCOUNTER — Encounter (HOSPITAL_COMMUNITY)
Admission: RE | Admit: 2019-12-16 | Discharge: 2019-12-16 | Disposition: A | Discharge status: Home / Self Care | Payer: PPO | Source: Ambulatory Visit | Attending: Orthopedic Surgery | Admitting: Orthopedic Surgery

## 2019-12-16 DIAGNOSIS — Z01812 Encounter for preprocedural laboratory examination: Secondary | ICD-10-CM | POA: Diagnosis not present

## 2019-12-16 HISTORY — DX: Autoimmune hepatitis: K75.4

## 2019-12-16 HISTORY — DX: Other overlap syndromes: M35.1

## 2019-12-16 HISTORY — DX: Other complications of anesthesia, initial encounter: T88.59XA

## 2019-12-16 LAB — PROTIME-INR
INR: 1.1 (ref 0.8–1.2)
Prothrombin Time: 13.3 seconds (ref 11.4–15.2)

## 2019-12-16 LAB — SURGICAL PCR SCREEN
MRSA, PCR: NEGATIVE
Staphylococcus aureus: NEGATIVE

## 2019-12-16 LAB — APTT: aPTT: 29 seconds (ref 24–36)

## 2019-12-17 ENCOUNTER — Other Ambulatory Visit (HOSPITAL_COMMUNITY): Admission: RE | Admit: 2019-12-17 | Payer: PPO | Source: Ambulatory Visit

## 2019-12-19 ENCOUNTER — Inpatient Hospital Stay: Payer: PPO

## 2019-12-21 ENCOUNTER — Ambulatory Visit (HOSPITAL_COMMUNITY): Admission: RE | Admit: 2019-12-21 | Payer: PPO | Source: Home / Self Care | Admitting: Orthopedic Surgery

## 2019-12-21 ENCOUNTER — Encounter (HOSPITAL_COMMUNITY): Admission: RE | Payer: Self-pay | Source: Home / Self Care

## 2019-12-21 LAB — TYPE AND SCREEN
ABO/RH(D): O POS
Antibody Screen: NEGATIVE

## 2019-12-21 SURGERY — ARTHROPLASTY, HIP, TOTAL, ANTERIOR APPROACH
Anesthesia: Choice | Site: Hip | Laterality: Left

## 2019-12-30 DIAGNOSIS — M1712 Unilateral primary osteoarthritis, left knee: Secondary | ICD-10-CM | POA: Diagnosis not present

## 2019-12-30 DIAGNOSIS — M25552 Pain in left hip: Secondary | ICD-10-CM | POA: Diagnosis not present

## 2020-01-16 DIAGNOSIS — N390 Urinary tract infection, site not specified: Secondary | ICD-10-CM | POA: Diagnosis not present

## 2020-01-16 DIAGNOSIS — R300 Dysuria: Secondary | ICD-10-CM | POA: Diagnosis not present

## 2020-01-31 ENCOUNTER — Encounter: Payer: Self-pay | Admitting: Obstetrics & Gynecology

## 2020-01-31 ENCOUNTER — Other Ambulatory Visit: Payer: Self-pay

## 2020-01-31 ENCOUNTER — Ambulatory Visit (INDEPENDENT_AMBULATORY_CARE_PROVIDER_SITE_OTHER): Payer: PPO | Admitting: Obstetrics & Gynecology

## 2020-01-31 ENCOUNTER — Encounter: Payer: PPO | Admitting: Obstetrics & Gynecology

## 2020-01-31 VITALS — BP 120/80 | Ht 65.5 in | Wt 156.0 lb

## 2020-01-31 DIAGNOSIS — Z01419 Encounter for gynecological examination (general) (routine) without abnormal findings: Secondary | ICD-10-CM

## 2020-01-31 DIAGNOSIS — M810 Age-related osteoporosis without current pathological fracture: Secondary | ICD-10-CM

## 2020-01-31 DIAGNOSIS — Z780 Asymptomatic menopausal state: Secondary | ICD-10-CM | POA: Diagnosis not present

## 2020-01-31 NOTE — Progress Notes (Signed)
Lori [name] 08-25-1945 [id number]   History:    74 y.o. [id number] Married.  RP:  Established patient presenting for annual gyn exam   HPI: Menopause, well on no HRT.  No PMB.  No pelvic pain.  Abstinent, husband has erection dysfunction.  Urine and bowel movements normal.  Breasts normal.  Body mass index 25.56. Physically active, but limited by left hip needing replacement.  Health labs with family physician.  Past medical history,surgical history, family history and social history were all reviewed and documented in the EPIC chart.  Gynecologic History No LMP recorded. Patient is postmenopausal.  Obstetric History OB History  Gravida Para Term Preterm AB Living  4 3 3   1 3   SAB TAB Ectopic Multiple Live Births  1       3    # Outcome Date GA Lbr Len/2nd Weight Sex Delivery Anes PTL Lv  4 SAB           3 Term     F Vag-Spont  N LIV  2 Term     M Vag-Spont  N LIV  1 Term     F Vag-Spont  N LIV     ROS: A ROS was performed and pertinent positives and negatives are included in the history.  GENERAL: No fevers or chills. HEENT: No change in vision, no earache, sore throat or sinus congestion. NECK: No pain or stiffness. CARDIOVASCULAR: No chest pain or pressure. No palpitations. PULMONARY: No shortness of breath, cough or wheeze. GASTROINTESTINAL: No abdominal pain, nausea, vomiting or diarrhea, melena or bright red blood per rectum. GENITOURINARY: No urinary frequency, urgency, hesitancy or dysuria. MUSCULOSKELETAL: No joint or muscle pain, no back pain, no recent trauma. DERMATOLOGIC: No rash, no itching, no lesions. ENDOCRINE: No polyuria, polydipsia, no heat or cold intolerance. No recent change in weight. HEMATOLOGICAL: No anemia or easy bruising or bleeding. NEUROLOGIC: No headache, seizures, numbness, tingling or weakness. PSYCHIATRIC: No depression, no loss of interest in normal activity or change in sleep pattern.     Exam:   BP 120/80   Ht 5' 5.5" (1.664 m)    Wt 156 lb (70.8 kg)   BMI 25.56 kg/m   Body mass index is 25.56 kg/m.  General appearance : Well developed well nourished female. No acute distress HEENT: Eyes: no retinal hemorrhage or exudates,  Neck supple, trachea midline, no carotid bruits, no thyroidmegaly Lungs: Clear to auscultation, no rhonchi or wheezes, or rib retractions  Heart: Regular rate and rhythm, no murmurs or gallops Breast:Examined in sitting and supine position were symmetrical in appearance, no palpable masses or tenderness,  no skin retraction, no nipple inversion, no nipple discharge, no skin discoloration, no axillary or supraclavicular lymphadenopathy Abdomen: no palpable masses or tenderness, no rebound or guarding Extremities: no edema or skin discoloration or tenderness  Pelvic: Vulva: Normal             Vagina: No gross lesions or discharge  Cervix: No gross lesions or discharge  Uterus  AV, normal size, shape and consistency, non-tender and mobile  Adnexa  Without masses or tenderness  Anus: Normal   Assessment/Plan:  74 y.o. female for annual exam   1. Well female exam with routine gynecological exam Normal gynecologic exam in menopause.  No indication to repeat a Pap test this year.  Breast exam normal.  Screening mammogram July 2021 was negative.  Colonoscopy 2018.  Body mass index 25.56.  Continue with healthy nutrition and fitness, as  much as the left hip allows.  2. Postmenopause Well on no hormone replacement therapy.  No postmenopausal bleeding.  3. Age-related osteoporosis without current pathological fracture Osteoporosis on bone density March 2019.  Schedule repeat bone density now.  Vitamin D supplements, calcium intake of 1200 to 1500 mg daily and regular weightbearing physical activities recommended. - DG Bone Density; Future  Princess Bruins MD, 2:55 PM 01/31/2020

## 2020-03-09 ENCOUNTER — Telehealth: Payer: Self-pay

## 2020-03-09 NOTE — Telephone Encounter (Signed)
   Primary Cardiologist: Shelva Majestic, MD  Chart reviewed as part of pre-operative protocol coverage. Patient was contacted 03/09/2020 in reference to pre-operative risk assessment for pending surgery as outlined below.  Lori [name] was last seen on 09/12/2019 by Dr. Claiborne Billings.  Since that day, Lori [name] has done well. She still has intermittent palpitation, however it is under control. The only time she has increased palpitation is when she has a infection. Her overall cardiac status is unchanged.   Therefore, based on ACC/AHA guidelines, the patient would be at acceptable risk for the planned procedure without further cardiovascular testing.   The patient was advised that if she develops new symptoms prior to surgery to contact our office to arrange for a follow-up visit, and she verbalized understanding.  I will route this recommendation to the requesting party via Epic fax function and remove from pre-op pool. Please call with questions.  La Ward, [place] 03/09/2020, 6:56 PM

## 2020-03-09 NOTE — Telephone Encounter (Signed)
Patient is currently on the highway with her husband driving and I had a hard time hearing her. I previously cleared the patient in July, however she decided to cancel the surgery and try to arrange the surgery at another medical center. She denies any recent chest pain, but does mentions increased palpitation. I will try to call her around 7PM to figure out what type of palpitation she has been having.

## 2020-03-09 NOTE — Telephone Encounter (Signed)
   [facility] Pre-operative Risk Assessment    HEARTCARE STAFF: - Please ensure there is not already an duplicate clearance open for this procedure. - Under Visit Info/Reason for Call, type in Other and utilize the format Clearance MM/DD/YY or Clearance TBD. Do not use dashes or single digits. - If request is for dental extraction, please clarify the # of teeth to be extracted.  Request for surgical clearance:  1. What type of surgery is being performed? Left Total Hip Arthoplasty- Anterior   2. When is this surgery scheduled? 05/15/2020   3. What type of clearance is required (medical clearance vs. Pharmacy clearance to hold med vs. Both)? Medical Clearance  4. Are there any medications that need to be held prior to surgery and how long? None taken by patient   5. Practice name and name of physician performing surgery? [facility], Dr. Pilar Plate Aluisio  6. What is the office phone number? [phone]   7.   What is the office fax number? [phone]  8.   Anesthesia type (None, local, MAC, general) ? Choice   Jacqulynn Cadet 03/09/2020, 9:44 AM  _________________________________________________________________   (provider comments below)

## 2020-03-13 DIAGNOSIS — R7301 Impaired fasting glucose: Secondary | ICD-10-CM | POA: Diagnosis not present

## 2020-03-13 DIAGNOSIS — E785 Hyperlipidemia, unspecified: Secondary | ICD-10-CM | POA: Diagnosis not present

## 2020-03-13 DIAGNOSIS — E039 Hypothyroidism, unspecified: Secondary | ICD-10-CM | POA: Diagnosis not present

## 2020-03-13 DIAGNOSIS — M810 Age-related osteoporosis without current pathological fracture: Secondary | ICD-10-CM | POA: Diagnosis not present

## 2020-03-16 DIAGNOSIS — N959 Unspecified menopausal and perimenopausal disorder: Secondary | ICD-10-CM | POA: Diagnosis not present

## 2020-03-16 DIAGNOSIS — R002 Palpitations: Secondary | ICD-10-CM | POA: Diagnosis not present

## 2020-03-16 DIAGNOSIS — R300 Dysuria: Secondary | ICD-10-CM | POA: Diagnosis not present

## 2020-03-16 DIAGNOSIS — M810 Age-related osteoporosis without current pathological fracture: Secondary | ICD-10-CM | POA: Diagnosis not present

## 2020-03-16 DIAGNOSIS — E039 Hypothyroidism, unspecified: Secondary | ICD-10-CM | POA: Diagnosis not present

## 2020-03-16 DIAGNOSIS — K760 Fatty (change of) liver, not elsewhere classified: Secondary | ICD-10-CM | POA: Diagnosis not present

## 2020-03-16 DIAGNOSIS — R7309 Other abnormal glucose: Secondary | ICD-10-CM | POA: Diagnosis not present

## 2020-03-16 DIAGNOSIS — R530 Neoplastic (malignant) related fatigue: Secondary | ICD-10-CM | POA: Diagnosis not present

## 2020-03-16 DIAGNOSIS — R5381 Other malaise: Secondary | ICD-10-CM | POA: Diagnosis not present

## 2020-03-16 DIAGNOSIS — K589 Irritable bowel syndrome without diarrhea: Secondary | ICD-10-CM | POA: Diagnosis not present

## 2020-03-19 DIAGNOSIS — Z1331 Encounter for screening for depression: Secondary | ICD-10-CM | POA: Diagnosis not present

## 2020-03-19 DIAGNOSIS — Z148 Genetic carrier of other disease: Secondary | ICD-10-CM | POA: Diagnosis not present

## 2020-03-19 DIAGNOSIS — M351 Other overlap syndromes: Secondary | ICD-10-CM | POA: Diagnosis not present

## 2020-03-19 DIAGNOSIS — Z1339 Encounter for screening examination for other mental health and behavioral disorders: Secondary | ICD-10-CM | POA: Diagnosis not present

## 2020-03-19 DIAGNOSIS — E785 Hyperlipidemia, unspecified: Secondary | ICD-10-CM | POA: Diagnosis not present

## 2020-03-19 DIAGNOSIS — R82998 Other abnormal findings in urine: Secondary | ICD-10-CM | POA: Diagnosis not present

## 2020-03-19 DIAGNOSIS — Z0000 Encounter for general adult medical examination without abnormal findings: Secondary | ICD-10-CM | POA: Diagnosis not present

## 2020-03-19 DIAGNOSIS — F39 Unspecified mood [affective] disorder: Secondary | ICD-10-CM | POA: Diagnosis not present

## 2020-03-19 DIAGNOSIS — R7301 Impaired fasting glucose: Secondary | ICD-10-CM | POA: Diagnosis not present

## 2020-03-19 DIAGNOSIS — R5382 Chronic fatigue, unspecified: Secondary | ICD-10-CM | POA: Diagnosis not present

## 2020-03-19 DIAGNOSIS — E039 Hypothyroidism, unspecified: Secondary | ICD-10-CM | POA: Diagnosis not present

## 2020-03-19 DIAGNOSIS — L299 Pruritus, unspecified: Secondary | ICD-10-CM | POA: Diagnosis not present

## 2020-03-19 DIAGNOSIS — M810 Age-related osteoporosis without current pathological fracture: Secondary | ICD-10-CM | POA: Diagnosis not present

## 2020-03-20 ENCOUNTER — Ambulatory Visit (INDEPENDENT_AMBULATORY_CARE_PROVIDER_SITE_OTHER): Payer: PPO

## 2020-03-20 ENCOUNTER — Other Ambulatory Visit: Payer: Self-pay

## 2020-03-20 ENCOUNTER — Other Ambulatory Visit: Payer: Self-pay | Admitting: Obstetrics & Gynecology

## 2020-03-20 DIAGNOSIS — M810 Age-related osteoporosis without current pathological fracture: Secondary | ICD-10-CM | POA: Diagnosis not present

## 2020-03-20 DIAGNOSIS — Z780 Asymptomatic menopausal state: Secondary | ICD-10-CM

## 2020-03-22 ENCOUNTER — Inpatient Hospital Stay: Payer: PPO | Attending: Hematology & Oncology

## 2020-03-22 ENCOUNTER — Encounter: Payer: Self-pay | Admitting: Family

## 2020-03-22 ENCOUNTER — Telehealth: Payer: Self-pay | Admitting: Family

## 2020-03-22 ENCOUNTER — Inpatient Hospital Stay (HOSPITAL_BASED_OUTPATIENT_CLINIC_OR_DEPARTMENT_OTHER): Payer: PPO | Admitting: Family

## 2020-03-22 ENCOUNTER — Other Ambulatory Visit: Payer: Self-pay

## 2020-03-22 ENCOUNTER — Other Ambulatory Visit: Payer: Self-pay | Admitting: Family

## 2020-03-22 DIAGNOSIS — R197 Diarrhea, unspecified: Secondary | ICD-10-CM | POA: Insufficient documentation

## 2020-03-22 DIAGNOSIS — Z79899 Other long term (current) drug therapy: Secondary | ICD-10-CM | POA: Insufficient documentation

## 2020-03-22 LAB — CMP (CANCER CENTER ONLY)
ALT: 28 U/L (ref 0–44)
AST: 27 U/L (ref 15–41)
Albumin: 4.7 g/dL (ref 3.5–5.0)
Alkaline Phosphatase: 76 U/L (ref 38–126)
Anion gap: 9 (ref 5–15)
BUN: 16 mg/dL (ref 8–23)
CO2: 28 mmol/L (ref 22–32)
Calcium: 10.4 mg/dL — ABNORMAL HIGH (ref 8.9–10.3)
Chloride: 99 mmol/L (ref 98–111)
Creatinine: 0.72 mg/dL (ref 0.44–1.00)
GFR, Estimated: >60 mL/min (ref >60)
Glucose, Bld: 82 mg/dL (ref 70–99)
Potassium: 4 mmol/L (ref 3.5–5.1)
Sodium: 136 mmol/L (ref 135–145)
Total Bilirubin: 0.4 mg/dL (ref 0.3–1.2)
Total Protein: 7.5 g/dL (ref 6.5–8.1)

## 2020-03-22 LAB — CBC WITH DIFFERENTIAL (CANCER CENTER ONLY)
Abs Immature Granulocytes: 0.01 10*3/uL (ref 0.00–0.07)
Basophils Absolute: 0.1 10*3/uL (ref 0.0–0.1)
Basophils Relative: 1 %
Eosinophils Absolute: 0.1 10*3/uL (ref 0.0–0.5)
Eosinophils Relative: 1 %
HCT: 45.5 % (ref 36.0–46.0)
Hemoglobin: 15.2 g/dL — ABNORMAL HIGH (ref 12.0–15.0)
Immature Granulocytes: 0 %
Lymphocytes Relative: 30 %
Lymphs Abs: 2.2 10*3/uL (ref 0.7–4.0)
MCH: 32.4 pg (ref 26.0–34.0)
MCHC: 33.4 g/dL (ref 30.0–36.0)
MCV: 97 fL (ref 80.0–100.0)
Monocytes Absolute: 0.6 10*3/uL (ref 0.1–1.0)
Monocytes Relative: 8 %
Neutro Abs: 4.5 10*3/uL (ref 1.7–7.7)
Neutrophils Relative %: 60 %
Platelet Count: 244 10*3/uL (ref 150–400)
RBC: 4.69 MIL/uL (ref 3.87–5.11)
RDW: 11.8 % (ref 11.5–15.5)
WBC Count: 7.4 10*3/uL (ref 4.0–10.5)
nRBC: 0 % (ref 0.0–0.2)

## 2020-03-22 NOTE — Telephone Encounter (Signed)
Appointments scheduled calendar printed & mailed per 10/28 los

## 2020-03-22 NOTE — Progress Notes (Signed)
Hematology and Oncology Follow Up Visit  Lori [name] [id number] 1946/02/17 74 y.o. 03/22/2020   Principle Diagnosis:  Hemochromatosis - heterozygous for the C282Y mutation  Current Therapy: Phlebotomy as indicated to keep ferritin <35(changed per patient request 06/24/17)   Interim History:  Lori [name] is here today for follow-up. She is finally scheduled for left hip replacement surgery on 05/01/2020 with Dr. Wynelle [name].  No falls or syncopal episodes to report.  No fever, chills, n/, cough, rash, dizziness, SOB, chest pain, palpitations or abdominal pain.  She still has diarrhea often and states that she is looking for a private facility that her insurance will cover for colonoscopy. She states that she has history of cancerous polyps. However, she does not want to be swabbed for covid prior to a procedure. This has limited her options.  She has not noted any obvious blood loss. No abnormal bruising or petechiae.  Vision is blurry due to cataracts. She states that she has appointment coming up with her ophthalmologist for further eval.  She states that she has generalized aches and pains due to arthritis as well as mixed connective tissue disease. She has found that hydroxychloroquine has helped.   She has maintained a good appetite and is staying well hydrated. Her weight is stable.   ECOG Performance Status: 1 - Symptomatic but completely ambulatory  Medications:  Allergies as of 03/22/2020      Reactions   Albumin (human)    Pt stated, "I am not allergic to this and don't know what this means"; 04/27/17   Amoxicillin Nausea And Vomiting   Codeine Nausea Only   Erythromycin Nausea Only, Other (See Comments)   Iodinated Diagnostic Agents Other (See Comments)   Keflex [cephalexin] Itching   Meperidine    Pt stated, "I don't know what this is"; 04/27/17   Metoclopramide Hcl    Other Other (See Comments)   Beef Chicken Dairy Eggs Pt stated, "My body can't  process protein; it makes me sick"   Percocet [oxycodone-acetaminophen]    Made pt sick    Shellfish Allergy Other (See Comments)   Pt stated, "I am not allergic to this anymore"; 04/27/17 Pt stated, "I am not allergic to this anymore"; 04/27/17   Shellfish-derived Products    Oxycodone Nausea And Vomiting   Pt stated, "I do not know if I am allergic to this"; 04/27/17      Medication List       Accurate as of March 22, 2020 11:19 AM. If you have any questions, ask your nurse or doctor.        amiloride-hydrochlorothiazide 5-50 MG tablet Commonly known as: MODURETIC Take 0.5 tablets by mouth daily.   dextroamphetamine 5 MG 24 hr capsule Commonly known as: DEXEDRINE SPANSULE Take 5 mg by mouth daily.   doxycycline 100 MG tablet Commonly known as: VIBRA-TABS Take 100 mg by mouth daily.   fish oil-omega-3 fatty acids 1000 MG capsule Take 1 g by mouth daily.   fluconazole 200 MG tablet Commonly known as: DIFLUCAN Take 100 mg by mouth daily as needed (yeast).   hydroxychloroquine 200 MG tablet Commonly known as: PLAQUENIL Take 200 mg by mouth daily.   MELATONIN PO Take 2 mg by mouth at bedtime.   metoprolol succinate 25 MG 24 hr tablet Commonly known as: TOPROL-XL Take 1 Tablet in AM and take 1/2 tablet in PM What changed:   how much to take  how to take this  when to take this  additional  instructions   potassium chloride SA 20 MEQ tablet Commonly known as: KLOR-CON Take 20 mEq by mouth daily.   PRASTERONE (DHEA) PO Take 28 mg by mouth daily.   PROBIOTIC DAILY PO Take 1 capsule by mouth daily.   progesterone 100 MG capsule Commonly known as: PROMETRIUM Take 100 mg by mouth daily.   QUERCETIN PO Take 1 capsule by mouth daily.   thyroid 60 MG tablet Commonly known as: ARMOUR Take 60 mg by mouth daily before breakfast.   thyroid 15 MG tablet Commonly known as: ARMOUR Take 15 mg by mouth 3 (three) times a week.   vitamin C 1000 MG tablet Take  1,000 mg by mouth daily.   VITAMIN D PO Take 1 tablet by mouth daily.   zinc gluconate 50 MG tablet Take 50 mg by mouth daily.       Allergies:  Allergies  Allergen Reactions  . Albumin (Human)     Pt stated, "I am not allergic to this and don't know what this means"; 04/27/17  . Amoxicillin Nausea And Vomiting  . Codeine Nausea Only  . Erythromycin Nausea Only and Other (See Comments)  . Iodinated Diagnostic Agents Other (See Comments)  . Keflex [Cephalexin] Itching  . Meperidine     Pt stated, "I don't know what this is"; 04/27/17  . Metoclopramide Hcl   . Other Other (See Comments)    Beef Chicken Dairy Eggs  Pt stated, "My body can't process protein; it makes me sick"  . Percocet [Oxycodone-Acetaminophen]     Made pt sick   . Shellfish Allergy Other (See Comments)    Pt stated, "I am not allergic to this anymore"; 04/27/17 Pt stated, "I am not allergic to this anymore"; 04/27/17  . Shellfish-Derived Products   . Oxycodone Nausea And Vomiting    Pt stated, "I do not know if I am allergic to this"; 04/27/17    Past Medical History, Surgical history, Social history, and Family History were reviewed and updated.  Review of Systems: All other 10 point review of systems is negative.   Physical Exam:  vitals were not taken for this visit.   Wt Readings from Last 3 Encounters:  01/31/20 156 lb (70.8 kg)  12/16/19 155 lb (70.3 kg)  12/07/19 154 lb 3.2 oz (69.9 kg)    Ocular: Sclerae unicteric, pupils equal, round and reactive to light Ear-nose-throat: Oropharynx clear, dentition fair Lymphatic: No cervical or supraclavicular adenopathy Lungs no rales or rhonchi, good excursion bilaterally Heart regular rate and rhythm, no murmur appreciated Abd soft, nontender, positive bowel sounds MSK no focal spinal tenderness, no joint edema Neuro: non-focal, well-oriented, appropriate affect Breasts: Deferred    Lab Results  Component Value Date   WBC 7.4 03/22/2020    HGB 15.2 (H) 03/22/2020   HCT 45.5 03/22/2020   MCV 97.0 03/22/2020   PLT 244 03/22/2020   Lab Results  Component Value Date   FERRITIN 39 12/07/2019   IRON 246 (H) 12/07/2019   TIBC 243 12/07/2019   UIBC UNABLE TO CALCULATE 12/07/2019   IRONPCTSAT 101 (H) 12/07/2019   Lab Results  Component Value Date   RBC 4.69 03/22/2020   No results found for: KPAFRELGTCHN, LAMBDASER, KAPLAMBRATIO No results found for: IGGSERUM, IGA, IGMSERUM No results found for: Odetta Pink, SPEI   Chemistry      Component Value Date/Time   NA 136 12/07/2019 1307   NA 143 03/11/2017 1404   NA 139 07/02/2016  1139   K 4.2 12/07/2019 1307   K 3.9 03/11/2017 1404   K 4.1 07/02/2016 1139   CL 101 12/07/2019 1307   CL 105 03/11/2017 1404   CO2 28 12/07/2019 1307   CO2 31 03/11/2017 1404   CO2 26 07/02/2016 1139   BUN 15 12/07/2019 1307   BUN 15 03/11/2017 1404   BUN 20.3 07/02/2016 1139   CREATININE 0.67 12/07/2019 1307   CREATININE 0.7 03/11/2017 1404   CREATININE 0.8 07/02/2016 1139      Component Value Date/Time   CALCIUM 10.5 (H) 12/07/2019 1307   CALCIUM 9.4 03/11/2017 1404   CALCIUM 10.0 07/02/2016 1139   ALKPHOS 79 12/07/2019 1307   ALKPHOS 69 03/11/2017 1404   ALKPHOS 87 07/02/2016 1139   AST 30 12/07/2019 1307   AST 27 07/02/2016 1139   ALT 32 12/07/2019 1307   ALT 32 03/11/2017 1404   ALT 34 07/02/2016 1139   BILITOT 0.6 12/07/2019 1307   BILITOT 0.55 07/02/2016 1139       Impression and Plan: Ms. Hoeger is a pleasant 74 yo caucasian female with hemochromatosis, heterozygous for the C282Y mutations.  Iron studies are pending. We will see how these look and schedule phlebotomy if needed.  Labs every 3 months with follow-up in 1 year.  She was encouraged to contact our office with any questions or concerns.   Laverna Peace, NP 10/28/202111:19 AM

## 2020-03-23 ENCOUNTER — Telehealth: Payer: Self-pay | Admitting: Family

## 2020-03-23 LAB — IRON AND TIBC
Iron: 229 ug/dL — ABNORMAL HIGH (ref 41–142)
Saturation Ratios: 79 % — ABNORMAL HIGH (ref 21–57)
TIBC: 291 ug/dL (ref 236–444)
UIBC: 62 ug/dL — ABNORMAL LOW (ref 120–384)

## 2020-03-23 LAB — FERRITIN: Ferritin: 31 ng/mL (ref 11–307)

## 2020-03-23 NOTE — Telephone Encounter (Signed)
Called and spoke with patient about appt added per 10/29 sch msg.  She was ok with both date & time

## 2020-03-26 ENCOUNTER — Inpatient Hospital Stay: Payer: PPO | Attending: Hematology & Oncology

## 2020-03-26 ENCOUNTER — Other Ambulatory Visit: Payer: Self-pay

## 2020-03-26 MED ORDER — SODIUM CHLORIDE 0.9 % IV SOLN
Freq: Once | INTRAVENOUS | Status: AC
  Filled 2020-03-26: qty 250

## 2020-03-26 NOTE — Patient Instructions (Signed)

## 2020-03-26 NOTE — Progress Notes (Signed)
Pt presented for phlebotomy per orders. Attempted phlebotomy to left [facility] with 18 gauge needle with blood return. PIV clotted off after approximately 2 cc of blood return. 20 gauge PIV placed to left forearm with blood return and approximately 10 cc of blood return; clotted off also. Normal Saline started via 20 gauge PIV without difficulty. Pt stated she has not been hydrating as much as usual. After 500 cc of normal saline unable to complete phlebotomy. Discussed situation with Laverna Peace, NP and ok to have pt return on another day to attempt full phlebotomy. Orders received to finish liter of fluid. Educated pt on importance to take ASA and hydrate as much as possible. Pt verbalized understanding and had no further questions. Pt discharged in stable condition and aware of new appointment.

## 2020-03-26 NOTE — Progress Notes (Signed)
Patient stable upon discharge.  

## 2020-03-27 ENCOUNTER — Other Ambulatory Visit: Payer: Self-pay | Admitting: Cardiology

## 2020-04-06 ENCOUNTER — Encounter (HOSPITAL_BASED_OUTPATIENT_CLINIC_OR_DEPARTMENT_OTHER): Payer: Self-pay | Admitting: Emergency Medicine

## 2020-04-06 ENCOUNTER — Emergency Department (HOSPITAL_BASED_OUTPATIENT_CLINIC_OR_DEPARTMENT_OTHER): Payer: PPO

## 2020-04-06 ENCOUNTER — Emergency Department (HOSPITAL_BASED_OUTPATIENT_CLINIC_OR_DEPARTMENT_OTHER)
Admission: EM | Admit: 2020-04-06 | Discharge: 2020-04-06 | Disposition: A | Discharge status: Home / Self Care | Payer: PPO | Attending: Emergency Medicine | Admitting: Emergency Medicine

## 2020-04-06 ENCOUNTER — Other Ambulatory Visit: Payer: Self-pay

## 2020-04-06 DIAGNOSIS — Z85828 Personal history of other malignant neoplasm of skin: Secondary | ICD-10-CM | POA: Insufficient documentation

## 2020-04-06 DIAGNOSIS — Z79899 Other long term (current) drug therapy: Secondary | ICD-10-CM | POA: Diagnosis not present

## 2020-04-06 DIAGNOSIS — K5732 Diverticulitis of large intestine without perforation or abscess without bleeding: Secondary | ICD-10-CM | POA: Insufficient documentation

## 2020-04-06 DIAGNOSIS — E039 Hypothyroidism, unspecified: Secondary | ICD-10-CM | POA: Insufficient documentation

## 2020-04-06 DIAGNOSIS — M48061 Spinal stenosis, lumbar region without neurogenic claudication: Secondary | ICD-10-CM | POA: Diagnosis not present

## 2020-04-06 DIAGNOSIS — Z86018 Personal history of other benign neoplasm: Secondary | ICD-10-CM | POA: Insufficient documentation

## 2020-04-06 DIAGNOSIS — R1031 Right lower quadrant pain: Secondary | ICD-10-CM | POA: Diagnosis present

## 2020-04-06 DIAGNOSIS — K5792 Diverticulitis of intestine, part unspecified, without perforation or abscess without bleeding: Secondary | ICD-10-CM

## 2020-04-06 DIAGNOSIS — I10 Essential (primary) hypertension: Secondary | ICD-10-CM | POA: Diagnosis not present

## 2020-04-06 DIAGNOSIS — M47816 Spondylosis without myelopathy or radiculopathy, lumbar region: Secondary | ICD-10-CM | POA: Diagnosis not present

## 2020-04-06 DIAGNOSIS — Z86010 Personal history of colonic polyps: Secondary | ICD-10-CM | POA: Insufficient documentation

## 2020-04-06 DIAGNOSIS — K449 Diaphragmatic hernia without obstruction or gangrene: Secondary | ICD-10-CM | POA: Diagnosis not present

## 2020-04-06 LAB — URINALYSIS, MICROSCOPIC (REFLEX)

## 2020-04-06 LAB — URINALYSIS, ROUTINE W REFLEX MICROSCOPIC
Bilirubin Urine: NEGATIVE
Glucose, UA: NEGATIVE mg/dL
Ketones, ur: NEGATIVE mg/dL
Nitrite: NEGATIVE
Protein, ur: NEGATIVE mg/dL
Specific Gravity, Urine: <1.005 — ABNORMAL LOW (ref 1.005–1.030)
pH: 6 (ref 5.0–8.0)

## 2020-04-06 LAB — BASIC METABOLIC PANEL
Anion gap: 11 (ref 5–15)
BUN: 10 mg/dL (ref 8–23)
CO2: 25 mmol/L (ref 22–32)
Calcium: 9.5 mg/dL (ref 8.9–10.3)
Chloride: 95 mmol/L — ABNORMAL LOW (ref 98–111)
Creatinine, Ser: 0.54 mg/dL (ref 0.44–1.00)
GFR, Estimated: >60 mL/min (ref >60)
Glucose, Bld: 99 mg/dL (ref 70–99)
Potassium: 3.5 mmol/L (ref 3.5–5.1)
Sodium: 131 mmol/L — ABNORMAL LOW (ref 135–145)

## 2020-04-06 LAB — CBC WITH DIFFERENTIAL/PLATELET
Abs Immature Granulocytes: 0.03 10*3/uL (ref 0.00–0.07)
Basophils Absolute: 0.1 10*3/uL (ref 0.0–0.1)
Basophils Relative: 1 %
Eosinophils Absolute: 0.1 10*3/uL (ref 0.0–0.5)
Eosinophils Relative: 1 %
HCT: 44.7 % (ref 36.0–46.0)
Hemoglobin: 15.4 g/dL — ABNORMAL HIGH (ref 12.0–15.0)
Immature Granulocytes: 0 %
Lymphocytes Relative: 29 %
Lymphs Abs: 2.7 10*3/uL (ref 0.7–4.0)
MCH: 32.2 pg (ref 26.0–34.0)
MCHC: 34.5 g/dL (ref 30.0–36.0)
MCV: 93.3 fL (ref 80.0–100.0)
Monocytes Absolute: 1 10*3/uL (ref 0.1–1.0)
Monocytes Relative: 10 %
Neutro Abs: 5.6 10*3/uL (ref 1.7–7.7)
Neutrophils Relative %: 59 %
Platelets: 242 10*3/uL (ref 150–400)
RBC: 4.79 MIL/uL (ref 3.87–5.11)
RDW: 11.9 % (ref 11.5–15.5)
WBC: 9.4 10*3/uL (ref 4.0–10.5)
nRBC: 0 % (ref 0.0–0.2)

## 2020-04-06 MED ORDER — CIPROFLOXACIN HCL 500 MG PO TABS
500.0000 mg | ORAL_TABLET | Freq: Once | ORAL | Status: AC
  Administered 2020-04-06: 500 mg via ORAL
  Filled 2020-04-06: qty 1

## 2020-04-06 MED ORDER — METRONIDAZOLE 500 MG PO TABS
500.0000 mg | ORAL_TABLET | Freq: Once | ORAL | Status: AC
  Administered 2020-04-06: 500 mg via ORAL
  Filled 2020-04-06: qty 1

## 2020-04-06 MED ORDER — CIPROFLOXACIN HCL 500 MG PO TABS: 500.0000 mg | ORAL_TABLET | Freq: Two times a day (BID) | ORAL | 0 refills | Status: DC

## 2020-04-06 MED ORDER — METRONIDAZOLE 500 MG PO TABS: 500.0000 mg | ORAL_TABLET | Freq: Two times a day (BID) | ORAL | 0 refills | Status: DC

## 2020-04-06 NOTE — ED Provider Notes (Signed)
[facility] EMERGENCY DEPARTMENT Provider Note   CSN: [id number] Arrival date & time: 04/06/20  1908     History Chief Complaint  Patient presents with  . Abdominal Pain    Lori [name] is a 74 y.o. female.  She is complaining of lower abdominal pain fevers chills and loose stools that started yesterday.  Pain was moderate at the time although somewhat improved currently.  She says this feels like her diverticulitis which she has had for 5 times in the past.  No blood in the stools.  Symptoms improved after she took " chlorine dioxide" which she is taking in the past before to treat infections.  She states she has to have hip replacement next month and she wants to get rid of all her infections.  She is also been troubled by multiple urinary infections over the past few months.  The history is provided by the patient.  Abdominal Pain Pain location:  Suprapubic, LLQ and RLQ Pain quality: cramping   Pain radiates to:  Does not radiate Pain severity:  Moderate Onset quality:  Sudden Progression:  Improving Chronicity:  Recurrent Context: not trauma   Relieved by:  OTC medications Worsened by:  Nothing Ineffective treatments:  None tried Associated symptoms: chills, diarrhea, fever and nausea   Associated symptoms: no chest pain, no constipation, no cough, no dysuria, no hematemesis, no hematochezia, no hematuria, no shortness of breath, no sore throat, no vaginal bleeding, no vaginal discharge and no vomiting        Past Medical History:  Diagnosis Date  . Autoimmune hepatitis ([place])   . Cancer ([place]) 06/2017   SQUAMOS CELL REMOVED FROM FOREHEAD  . Chronic fatigue   . Complication of anesthesia    Partial Hysterectomy-Severe pain that radiated to esophogaus, and 12 more episode   . Hormone disorder   . Hypothyroidism   . IBS (irritable bowel syndrome)   . Lupus ([place])   . Mixed connective tissue disease ([place])   . Osteoporosis 07/2017   T score -2.7  stable from prior DEXA  . Palpitations   . Rheumatoid arthritis [facility]     Patient Active Problem List   Diagnosis Date Noted  . Essential hypertension 09/09/2018  . Precordial chest pain 09/09/2018  . Hormone disorder 02/22/2018  . History of colon polyps 04/20/2017  . Other constipation 04/20/2017  . Benign mass of parotid gland 04/03/2016  . PIC line (peripherally inserted central catheter) flush 03/24/2016  . Cough 02/26/2016  . Chronic vasomotor rhinitis 02/26/2016  . Perennial allergic rhinitis 02/26/2016  . Osteoporosis 09/05/2015  . Primary hemochromatosis ([place]) 08/27/2015  . Urinary tract infection, site not specified 04/24/2015  . HSV-2 infection 09/01/2014  . Elevated ferritin 08/16/2014  . Family history of colon cancer 05/10/2014  . Atrial bigeminy 04/13/2014  . Atypical chest pain 03/23/2014  . Dysphagia 03/23/2014  . Esophageal reflux 03/23/2014  . Heartburn 03/23/2014  . Incomplete emptying of bladder 03/23/2014  . Increased frequency of urination 03/23/2014  . Mixed connective tissue disease ([place]) 03/23/2014  . Nonspecific elevation of level of transaminase or lactic acid dehydrogenase (LDH) 03/23/2014  . Urinary incontinence 03/23/2014  . Urinary straining 03/23/2014  . Urinary hesitancy 03/23/2014  . Vaginal candidiasis 03/23/2014  . Palpitation 04/23/2013  . Chronic fatigue 04/23/2013  . History of bladder surgery 04/23/2013  . PNEUMONIA, ATYPICAL 04/27/2009  . Diffuse connective tissue disease ([place]) 04/27/2009  . LUNG NODULE 08/24/2007  . SHORTNESS OF BREATH (SOB) 08/24/2007  Past Surgical History:  Procedure Laterality Date  . ABDOMINAL HYSTERECTOMY     Partial   . ABDOMINAL SURGERY     ABDOMINOPLASTY  . BLADDER SUSPENSION    . BREAST SURGERY  1980   LUMPECTOMY X 2 FROM RIGHT BREAST  . CARDIOVASCULAR STRESS TEST  08/28/2011   Normal  . CAROTID DOPPLER  08/28/2011   Normal, no evidence of significant diameter reduction, dissection, or  vascular abnormality  . COLONOSCOPY W/ POLYPECTOMY  2018  . LAPAROSCOPIC CHOLECYSTECTOMY    . TONSILLECTOMY    . TONSILLECTOMY AND ADENOIDECTOMY    . TRANSTHORACIC ECHOCARDIOGRAM  01/29/2011   EF >55%, mild concentric LVH  . TUBAL LIGATION       OB History    Gravida  4   Para  3   Term  3   Preterm      AB  1   Living  3     SAB  1   TAB      Ectopic      Multiple      Live Births  3           Family History  Problem Relation Age of Onset  . Seizures Mother   . Transient ischemic attack Mother   . Glaucoma Mother   . Cancer Father 37       COLON  . Diabetes Father   . Heart disease Father   . Breast cancer Maternal Aunt   . Heart disease Paternal Uncle   . Breast cancer Maternal Aunt   . Glaucoma Maternal Grandmother     Social History   Tobacco Use  . Smoking status: Never Smoker  . Smokeless tobacco: Never Used  Vaping Use  . Vaping Use: Never used  Substance Use Topics  . Alcohol use: Yes    Alcohol/week: 0.0 standard drinks    Comment: RARE  . Drug use: No    Home Medications Prior to Admission medications   Medication Sig Start Date End Date Taking? Authorizing Provider  amiloride-hydrochlorothiazide (MODURETIC) 5-50 MG tablet Take 0.5 tablets by mouth daily.  11/21/16   [provider]  Ascorbic Acid (VITAMIN C) 1000 MG tablet Take 1,000 mg by mouth daily.     [provider]  Cholecalciferol (VITAMIN D PO) Take 1 tablet by mouth daily.    [provider]  dextroamphetamine (DEXEDRINE SPANSULE) 5 MG 24 hr capsule Take 5 mg by mouth daily. 06/30/14   [provider]  doxycycline (VIBRA-TABS) 100 MG tablet Take 100 mg by mouth daily. 09/06/19   [provider]  fish oil-omega-3 fatty acids 1000 MG capsule Take 1 g by mouth daily.    [provider]  fluconazole (DIFLUCAN) 200 MG tablet Take 100 mg by mouth daily as needed (yeast).    [provider]  hydroxychloroquine  (PLAQUENIL) 200 MG tablet Take 200 mg by mouth daily.    [provider]  hydrOXYzine (ATARAX/VISTARIL) 25 MG tablet Take 25 mg by mouth 3 (three) times daily. 03/20/20   [provider]  MELATONIN PO Take 2 mg by mouth at bedtime.     [provider]  metoprolol succinate (TOPROL-XL) 25 MG 24 hr tablet TAKE 1 TABLET IN AM AND 1/2 TABLET IN THE PM. 03/27/20   Troy Sine, MD  nystatin (MYCOSTATIN) 500000 units TABS tablet Take 1 tablet by mouth at bedtime. 03/19/20   [provider]  potassium chloride SA (K-DUR,KLOR-CON) 20 MEQ tablet Take  20 mEq by mouth daily.  03/09/14   [provider]  PRASTERONE, DHEA, PO Take 28 mg by mouth daily.     [provider]  Probiotic Product (PROBIOTIC DAILY PO) Take 1 capsule by mouth daily.    [provider]  progesterone (PROMETRIUM) 100 MG capsule Take 100 mg by mouth daily.     [provider]  QUERCETIN PO Take 1 capsule by mouth daily.    [provider]  thyroid (ARMOUR) 15 MG tablet Take 15 mg by mouth 3 (three) times a week.    [provider]  thyroid (ARMOUR) 60 MG tablet Take 60 mg by mouth daily before breakfast.    [provider]  zinc gluconate 50 MG tablet Take 50 mg by mouth daily.    [provider]    Allergies    Albumin (human), Amoxicillin, Codeine, Erythromycin, Iodinated diagnostic agents, Keflex [cephalexin], Meperidine, Metoclopramide hcl, Other, Percocet [oxycodone-acetaminophen], Shellfish allergy, Shellfish-derived products, and Oxycodone  Review of Systems   Review of Systems  Constitutional: Positive for chills and fever.  HENT: Negative for sore throat.   Eyes: Negative for visual disturbance.  Respiratory: Negative for cough and shortness of breath.   Cardiovascular: Negative for chest pain.  Gastrointestinal: Positive for abdominal pain, diarrhea and nausea. Negative for constipation, hematemesis, hematochezia  and vomiting.  Genitourinary: Negative for dysuria, hematuria, vaginal bleeding and vaginal discharge.  Musculoskeletal: Negative for neck pain.  Skin: Negative for rash.  Neurological: Negative for headaches.    Physical Exam Updated Vital Signs BP 106/70 (BP Location: Left Arm)   Pulse 92   Temp 98.5 F (36.9 C) (Oral)   Resp 18   Ht 5' 5.5" (1.664 m)   Wt 70.8 kg   SpO2 100%   BMI 25.58 kg/m   Physical Exam Vitals and nursing note reviewed.  Constitutional:      General: She is not in acute distress.    Appearance: Normal appearance. She is well-developed.  HENT:     Head: Normocephalic and atraumatic.  Eyes:     Conjunctiva/sclera: Conjunctivae normal.  Cardiovascular:     Rate and Rhythm: Normal rate and regular rhythm.     Heart sounds: No murmur heard.   Pulmonary:     Effort: Pulmonary effort is normal. No respiratory distress.     Breath sounds: Normal breath sounds.  Abdominal:     Palpations: Abdomen is soft.     Tenderness: There is no abdominal tenderness. There is no guarding or rebound.  Musculoskeletal:        General: No deformity.     Cervical back: Neck supple.  Skin:    General: Skin is warm and dry.     Capillary Refill: Capillary refill takes less than 2 seconds.  Neurological:     General: No focal deficit present.     Mental Status: She is alert.     Gait: Gait normal.     ED Results / Procedures / Treatments   Labs (all labs ordered are listed, but only abnormal results are displayed) Labs Reviewed  URINALYSIS, ROUTINE W REFLEX MICROSCOPIC - Abnormal; Notable for the following components:      Result Value   Specific Gravity, Urine <1.005 (*)    Hgb urine dipstick TRACE (*)    Leukocytes,Ua TRACE (*)    All other components within normal limits  CBC WITH DIFFERENTIAL/PLATELET - Abnormal; Notable for the following components:   Hemoglobin 15.4 (*)  All other components within normal limits  BASIC METABOLIC PANEL - Abnormal;  Notable for the following components:   Sodium 131 (*)    Chloride 95 (*)    All other components within normal limits  URINALYSIS, MICROSCOPIC (REFLEX) - Abnormal; Notable for the following components:   Bacteria, UA RARE (*)    All other components within normal limits  URINE CULTURE    EKG None  Radiology CT Abdomen Pelvis Wo Contrast  Result Date: 04/06/2020 CLINICAL DATA:  Lower abdominal pain, diverticulitis EXAM: CT ABDOMEN AND PELVIS WITHOUT CONTRAST TECHNIQUE: Multidetector CT imaging of the abdomen and pelvis was performed following the standard protocol without IV contrast. COMPARISON:  08/22/2017 FINDINGS: Lower chest: The visualized lung bases are clear. The visualized heart and pericardium are unremarkable. Small hiatal hernia. Hepatobiliary: Cholecystectomy has been performed. Mild hepatomegaly, stable since prior examination. No focal liver lesion identified on this noncontrast examination. No intra or extrahepatic biliary ductal dilation. Pancreas: Unremarkable Spleen: Unremarkable Adrenals/Urinary Tract: Adrenal glands are unremarkable. Kidneys are normal. Bladder is unremarkable Stomach/Bowel: The stomach and small bowel are unremarkable. Appendix normal. There is moderate to severe descending and sigmoid colonic diverticulosis. There is superimposed circumferential bowel wall thickening and pericolonic inflammatory stranding involving the mid sigmoid colon in keeping with changes of acute, mild, uncomplicated sigmoid diverticulitis. This is best seen on axial image # 55 and coronal image # 67. No free intraperitoneal gas or fluid. No loculated intra-abdominal fluid collections. No evidence of obstruction. Vascular/Lymphatic: Mild atherosclerotic calcification within the abdominal aorta. No aortic aneurysm. No pathologic adenopathy within the abdomen and pelvis. Reproductive: Uterus and bilateral adnexa are unremarkable. Other: Rectum unremarkable. Musculoskeletal: Degenerative  changes are seen within the lumbar spine with grade 1 anterolisthesis of L4 upon L5 resulting in mild central canal stenosis and moderate to severe left neural foraminal narrowing at this level. Right foraminal disc herniation at L3-4 results in marked right neural foraminal narrowing at this level. This is not well characterized on this examination. No lytic or blastic bone lesion is seen. Advanced degenerative changes are seen within the left hip. IMPRESSION: Acute, mild, uncomplicated sigmoid diverticulitis. Background moderate to severe distal colonic diverticulosis. Degenerative changes within the lumbar spine, not well assessed on this examination, with resultant neural foraminal narrowing and central canal stenosis. This could be better assessed with MRI examination, if indicated. Aortic Atherosclerosis (ICD10-I70.0). Electronically Signed   By: Fidela Salis[place] MD   On: 04/06/2020 21:57    Procedures Procedures (including critical care time)  Medications Ordered in ED Medications  ciprofloxacin (CIPRO) tablet 500 mg (500 mg Oral Given 04/06/20 2245)  metroNIDAZOLE (FLAGYL) tablet 500 mg (500 mg Oral Given 04/06/20 2245)    ED Course  I have reviewed the triage vital signs and the nursing notes.  Pertinent labs & imaging results that were available during my care of the patient were reviewed by me and considered in my medical decision making (see chart for details).    MDM Rules/Calculators/A&P                         This patient complains of lower abdominal pain subjective fever and chills, diarrhea this involves an extensive number of treatment Options and is a complaint that carries with it a high risk of complications and Morbidity. The differential includes UTI, diverticulitis, colitis, gastroenteritis, IBD  I ordered, reviewed and interpreted labs, which included CBC with normal white count stable hemoglobin, chemistries fairly normal other than  mildly low sodium, urinalysis  without gross signs of infection I ordered medication oral Cipro and Flagyl I ordered imaging studies which included CT abdomen and pelvis and I independently    visualized and interpreted imaging which showed uncomplicated diverticulitis Additional history obtained from patient's husband Previous records obtained and reviewed in epic  After the interventions stated above, I reevaluated the patient and found found patient to be minimally symptomatic.  She is comfortable with plan for outpatient treatment of her acute diverticulitis.  She said she has been on Cipro and Flagyl before for same.  Return instructions discussed.  Also discussed with her the fact that she has had multiple episodes of diverticulitis and has severe diverticulosis by CT.  Recommended she consider following up with general surgery.   Final Clinical Impression(s) / ED Diagnoses Final diagnoses:  Acute diverticulitis    Rx / DC Orders ED Discharge Orders         Ordered    ciprofloxacin (CIPRO) 500 MG tablet  2 times daily        04/06/20 2242    metroNIDAZOLE (FLAGYL) 500 MG tablet  2 times daily        04/06/20 2242           Hayden Rasmussen, MD 04/07/20 1000

## 2020-04-06 NOTE — ED Notes (Signed)
Patient transported to CT 

## 2020-04-06 NOTE — ED Triage Notes (Signed)
Patient presents from GI office for eval for possible diverticulitis; states sx onset yesterday. Denies pain at this time.

## 2020-04-06 NOTE — Discharge Instructions (Addendum)
You were seen in the emergency department for lower abdominal pain fever chills diarrhea similar to prior episodes of diverticulitis.  You had blood work that did not show any significant abnormalities but your CAT scan did show uncomplicated diverticulitis.  This is usually treated with bowel rest and oral antibiotics.  Please follow-up with your primary care doctor.  Please also discuss with your primary care doctor possible surgical referral to discuss of there is a surgical option for your recurrent diverticulitis.  Return to the emergency department for any worsening or concerning symptoms

## 2020-04-09 LAB — URINE CULTURE: Culture: 50000 — AB

## 2020-04-10 ENCOUNTER — Inpatient Hospital Stay: Payer: PPO

## 2020-04-10 ENCOUNTER — Telehealth: Payer: Self-pay

## 2020-04-10 ENCOUNTER — Telehealth: Payer: Self-pay | Admitting: Emergency Medicine

## 2020-04-10 NOTE — Telephone Encounter (Signed)
Post ED Visit - Positive Culture Follow-up  Culture report reviewed by antimicrobial stewardship pharmacist: [place] Team []  Elenor Quinones, Pharm.D. []  Heide Guile, Pharm.D., BCPS AQ-ID []  Parks Neptune, Pharm.D., BCPS []  Alycia Rossetti, Pharm.D., BCPS []  [place], Pharm.D., BCPS, AAHIVP []  Legrand Como, Pharm.D., BCPS, AAHIVP []  Salome Arnt, PharmD, BCPS []  Johnnette Gourd, PharmD, BCPS []  Hughes Better, PharmD, BCPS []  Leeroy Cha, PharmD []  Laqueta Linden, PharmD, BCPS []  Albertina Parr, PharmD Pearson Grippe PharmD  [place] Team []  Leodis Sias, PharmD []  Lindell Spar, PharmD []  Royetta Asal, PharmD []  Graylin Shiver, Rph []  Rema Fendt) Glennon Mac, PharmD []  Arlyn Dunning, PharmD []  Netta Cedars, PharmD []  Dia Sitter, PharmD []  Leone Haven, PharmD []  Gretta Arab, PharmD []  Theodis Shove, PharmD []  Peggyann Juba, PharmD []  Reuel Boom, PharmD   Positive urine culture Treated with cirprofloxacin,, asymptomatic and no further patient follow-up is required at this time.  Hazle Nordmann 04/10/2020, 9:32 AM

## 2020-04-10 NOTE — Telephone Encounter (Signed)
Pt called in to r/s todays phlebot to 11/18 as she is not feeling up to it today... AOM

## 2020-04-11 ENCOUNTER — Telehealth: Payer: Self-pay | Admitting: *Deleted

## 2020-04-11 NOTE — Telephone Encounter (Signed)
Patient called stating Dr.Hung office needs referral for screening colonoscopy. I called his office and was informed to fax office note and any labs done. No labs done at this office, office note faxed from visit on 01/31/20. Also was told patient will need to get her previous records from other GI MD and send to them.Patient informed once they have GI info they will call her to schedule. Office note faxed to [phone]

## 2020-04-12 ENCOUNTER — Inpatient Hospital Stay: Payer: PPO

## 2020-04-12 ENCOUNTER — Other Ambulatory Visit: Payer: Self-pay

## 2020-04-12 VITALS — BP 97/44 | HR 78 | Temp 97.8°F | Resp 18

## 2020-04-12 DIAGNOSIS — Z452 Encounter for adjustment and management of vascular access device: Secondary | ICD-10-CM

## 2020-04-12 DIAGNOSIS — M810 Age-related osteoporosis without current pathological fracture: Secondary | ICD-10-CM

## 2020-04-12 MED ORDER — SODIUM CHLORIDE 0.9 % IV SOLN
Freq: Once | INTRAVENOUS | Status: AC
  Filled 2020-04-12: qty 250

## 2020-04-12 NOTE — Progress Notes (Signed)
Iv started for phlebotomy, fluids started as patients BP was 97/44 prior to phlebotomy. Rechecked BP after 267ml of fluids BP 105/49. Okay to continue phlebotomy per Laverna Peace, NP.  Attempted phlebotomy through IV and unable to get more than 2 cc of blood and unable to retrieve anymore. Patient agrees to d/c IV and discuss other options for phlebotomy with Dr.Ennever. Dixie, RN informed Dr.Ennever of procedure being stopped and patients concerns of surgery coming up.

## 2020-04-13 DIAGNOSIS — Z87440 Personal history of urinary (tract) infections: Secondary | ICD-10-CM | POA: Diagnosis not present

## 2020-04-13 DIAGNOSIS — N3941 Urge incontinence: Secondary | ICD-10-CM | POA: Diagnosis not present

## 2020-04-17 DIAGNOSIS — Z0189 Encounter for other specified special examinations: Secondary | ICD-10-CM | POA: Diagnosis not present

## 2020-04-18 ENCOUNTER — Telehealth: Payer: Self-pay | Admitting: *Deleted

## 2020-04-18 ENCOUNTER — Other Ambulatory Visit: Payer: Self-pay | Admitting: Family

## 2020-04-18 NOTE — Telephone Encounter (Signed)
Call received from patient requesting an order be placed by Dr. Marin Olp for a port placement.  Dr. Marin Olp notified.

## 2020-04-18 NOTE — Progress Notes (Signed)
Port a cath order placed per patient preference for phlebotomies.

## 2020-04-24 ENCOUNTER — Telehealth: Payer: Self-pay

## 2020-04-24 NOTE — Telephone Encounter (Signed)
Called pt per 11/30 inbasket and scheduled a phlebot, lm with appt     AOM

## 2020-04-25 ENCOUNTER — Other Ambulatory Visit: Payer: Self-pay | Admitting: Radiology

## 2020-04-26 DIAGNOSIS — N3941 Urge incontinence: Secondary | ICD-10-CM | POA: Diagnosis not present

## 2020-04-27 ENCOUNTER — Other Ambulatory Visit: Payer: Self-pay

## 2020-04-27 ENCOUNTER — Ambulatory Visit (HOSPITAL_COMMUNITY)
Admission: RE | Admit: 2020-04-27 | Discharge: 2020-04-27 | Disposition: A | Discharge status: Home / Self Care | Payer: PPO | Source: Ambulatory Visit | Attending: Family | Admitting: Family

## 2020-04-27 ENCOUNTER — Encounter (HOSPITAL_COMMUNITY): Payer: Self-pay

## 2020-04-27 DIAGNOSIS — Z91013 Allergy to seafood: Secondary | ICD-10-CM | POA: Diagnosis not present

## 2020-04-27 DIAGNOSIS — Z79899 Other long term (current) drug therapy: Secondary | ICD-10-CM | POA: Insufficient documentation

## 2020-04-27 DIAGNOSIS — Z885 Allergy status to narcotic agent status: Secondary | ICD-10-CM | POA: Diagnosis not present

## 2020-04-27 DIAGNOSIS — Z91041 Radiographic dye allergy status: Secondary | ICD-10-CM | POA: Diagnosis not present

## 2020-04-27 DIAGNOSIS — Z452 Encounter for adjustment and management of vascular access device: Secondary | ICD-10-CM | POA: Diagnosis not present

## 2020-04-27 DIAGNOSIS — Z888 Allergy status to other drugs, medicaments and biological substances status: Secondary | ICD-10-CM | POA: Insufficient documentation

## 2020-04-27 DIAGNOSIS — Z881 Allergy status to other antibiotic agents status: Secondary | ICD-10-CM | POA: Insufficient documentation

## 2020-04-27 HISTORY — PX: IR IMAGING GUIDED PORT INSERTION: IMG5740

## 2020-04-27 HISTORY — DX: Hemochromatosis, unspecified: E83.119

## 2020-04-27 LAB — CBC WITH DIFFERENTIAL/PLATELET
Abs Immature Granulocytes: 0.01 10*3/uL (ref 0.00–0.07)
Basophils Absolute: 0 10*3/uL (ref 0.0–0.1)
Basophils Relative: 1 %
Eosinophils Absolute: 0.1 10*3/uL (ref 0.0–0.5)
Eosinophils Relative: 1 %
HCT: 47.4 % — ABNORMAL HIGH (ref 36.0–46.0)
Hemoglobin: 15.8 g/dL — ABNORMAL HIGH (ref 12.0–15.0)
Immature Granulocytes: 0 %
Lymphocytes Relative: 41 %
Lymphs Abs: 2.3 10*3/uL (ref 0.7–4.0)
MCH: 32.8 pg (ref 26.0–34.0)
MCHC: 33.3 g/dL (ref 30.0–36.0)
MCV: 98.5 fL (ref 80.0–100.0)
Monocytes Absolute: 0.7 10*3/uL (ref 0.1–1.0)
Monocytes Relative: 13 %
Neutro Abs: 2.4 10*3/uL (ref 1.7–7.7)
Neutrophils Relative %: 44 %
Platelets: 222 10*3/uL (ref 150–400)
RBC: 4.81 MIL/uL (ref 3.87–5.11)
RDW: 12.4 % (ref 11.5–15.5)
WBC: 5.5 10*3/uL (ref 4.0–10.5)
nRBC: 0 % (ref 0.0–0.2)

## 2020-04-27 LAB — PROTIME-INR
INR: 1 (ref 0.8–1.2)
Prothrombin Time: 13.2 seconds (ref 11.4–15.2)

## 2020-04-27 MED ORDER — MIDAZOLAM HCL 2 MG/2ML IJ SOLN
INTRAMUSCULAR | Status: AC | PRN
  Administered 2020-04-27: 1 mg via INTRAVENOUS
  Administered 2020-04-27 (×2): 0.5 mg via INTRAVENOUS

## 2020-04-27 MED ORDER — SODIUM CHLORIDE 0.9 % IV SOLN: INTRAVENOUS | Status: DC

## 2020-04-27 MED ORDER — FENTANYL CITRATE (PF) 100 MCG/2ML IJ SOLN
INTRAMUSCULAR | Status: AC | PRN
Start: 2020-04-27 — End: 2020-04-27
  Administered 2020-04-27: 25 ug via INTRAVENOUS
  Administered 2020-04-27: 50 ug via INTRAVENOUS
  Administered 2020-04-27: 25 ug via INTRAVENOUS

## 2020-04-27 MED ORDER — CLINDAMYCIN PHOSPHATE 900 MG/50ML IV SOLN
900.0000 mg | Freq: Once | INTRAVENOUS | Status: AC
  Administered 2020-04-27: 900 mg via INTRAVENOUS

## 2020-04-27 MED ORDER — HEPARIN SOD (PORK) LOCK FLUSH 100 UNIT/ML IV SOLN
INTRAVENOUS | Status: AC | PRN
  Administered 2020-04-27: 500 [IU]

## 2020-04-27 MED ORDER — SODIUM CHLORIDE 0.9 % IV SOLN
INTRAVENOUS | Status: AC | PRN
  Administered 2020-04-27: 10 mL/h via INTRAVENOUS

## 2020-04-27 MED ORDER — HEPARIN SOD (PORK) LOCK FLUSH 100 UNIT/ML IV SOLN
INTRAVENOUS | Status: AC
  Filled 2020-04-27: qty 5

## 2020-04-27 MED ORDER — LIDOCAINE HCL 1 % IJ SOLN
INTRAMUSCULAR | Status: AC
  Filled 2020-04-27: qty 20

## 2020-04-27 NOTE — Sedation Documentation (Signed)
Patient is resting comfortably. 

## 2020-04-27 NOTE — Consult Note (Signed)
Chief Complaint: Patient was seen in consultation today for port a cath placement  Referring Physician(s): Knox M/Ennever,P  Supervising Physician: Aletta Edouard  Patient Status: [place]  History of Present Illness: Lori [name] is a 74 y.o. female with history of poor venous access, hemochromatosis with need for chronic phlebotomies who presents today for Port-A-Cath placement.  Additional medical history as below.  Past Medical History:  Diagnosis Date  . Autoimmune hepatitis ([place])   . Cancer ([place]) 06/2017   SQUAMOS CELL REMOVED FROM FOREHEAD  . Chronic fatigue   . Complication of anesthesia    Partial Hysterectomy-Severe pain that radiated to esophogaus, and 12 more episode   . Hormone disorder   . Hypothyroidism   . IBS (irritable bowel syndrome)   . Lupus ([place])   . Mixed connective tissue disease ([place])   . Osteoporosis 07/2017   T score -2.7 stable from prior DEXA  . Palpitations   . Rheumatoid arthritis [facility])     Past Surgical History:  Procedure Laterality Date  . ABDOMINAL HYSTERECTOMY     Partial   . ABDOMINAL SURGERY     ABDOMINOPLASTY  . BLADDER SUSPENSION    . BREAST SURGERY  1980   LUMPECTOMY X 2 FROM RIGHT BREAST  . CARDIOVASCULAR STRESS TEST  08/28/2011   Normal  . CAROTID DOPPLER  08/28/2011   Normal, no evidence of significant diameter reduction, dissection, or vascular abnormality  . COLONOSCOPY W/ POLYPECTOMY  2018  . LAPAROSCOPIC CHOLECYSTECTOMY    . TONSILLECTOMY    . TONSILLECTOMY AND ADENOIDECTOMY    . TRANSTHORACIC ECHOCARDIOGRAM  01/29/2011   EF >55%, mild concentric LVH  . TUBAL LIGATION      Allergies: Albumin (human), Amoxicillin, Codeine, Erythromycin, Iodinated diagnostic agents, Keflex [cephalexin], Meperidine, Metoclopramide hcl, Other, Percocet [oxycodone-acetaminophen], Shellfish allergy, Shellfish-derived products, and Oxycodone  Medications: Prior to Admission medications   Medication Sig Start  Date End Date Taking? Authorizing Provider  amiloride-hydrochlorothiazide (MODURETIC) 5-50 MG tablet Take 0.5 tablets by mouth daily.  11/21/16  Yes [provider]  Ascorbic Acid (VITAMIN C) 1000 MG tablet Take 1,000 mg by mouth daily.    Yes [provider]  Cholecalciferol (VITAMIN D PO) Take 1 tablet by mouth daily.   Yes [provider]  ciprofloxacin (CIPRO) 500 MG tablet Take 1 tablet (500 mg total) by mouth 2 (two) times daily. 04/06/20  Yes Hayden Rasmussen, MD  dextroamphetamine (DEXEDRINE SPANSULE) 5 MG 24 hr capsule Take 5 mg by mouth daily. 06/30/14  Yes [provider]  fish oil-omega-3 fatty acids 1000 MG capsule Take 1 g by mouth daily.   Yes [provider]  fluconazole (DIFLUCAN) 200 MG tablet Take 100 mg by mouth daily as needed (yeast).   Yes [provider]  hydroxychloroquine (PLAQUENIL) 200 MG tablet Take 200 mg by mouth daily.   Yes [provider]  MELATONIN PO Take 2 mg by mouth at bedtime.    Yes [provider]  metoprolol succinate (TOPROL-XL) 25 MG 24 hr tablet TAKE 1 TABLET IN AM AND 1/2 TABLET IN THE PM. 03/27/20  Yes Troy Sine, MD  metroNIDAZOLE (FLAGYL) 500 MG tablet Take 1 tablet (500 mg total) by mouth 2 (two) times daily. 04/06/20  Yes Hayden Rasmussen, MD  nystatin (MYCOSTATIN) 500000 units TABS tablet Take 1 tablet by mouth at bedtime. 03/19/20  Yes [provider]  potassium chloride SA (K-DUR,KLOR-CON) 20 MEQ tablet Take 20 mEq by mouth daily.  03/09/14  Yes [provider]  PRASTERONE, DHEA, PO Take 28 mg by mouth daily.    Yes [provider]  Probiotic Product (PROBIOTIC DAILY PO) Take 1 capsule by mouth daily.   Yes [provider]  progesterone (PROMETRIUM) 100 MG capsule Take 100 mg by mouth daily.    Yes [provider]  QUERCETIN PO Take 1 capsule by mouth daily.   Yes [provider]  thyroid (ARMOUR) 15 MG tablet Take 15  mg by mouth 3 (three) times a week.   Yes [provider]  thyroid (ARMOUR) 60 MG tablet Take 60 mg by mouth daily before breakfast.   Yes [provider]  zinc gluconate 50 MG tablet Take 50 mg by mouth daily.   Yes [provider]  doxycycline (VIBRA-TABS) 100 MG tablet Take 100 mg by mouth daily. 09/06/19   [provider]  hydrOXYzine (ATARAX/VISTARIL) 25 MG tablet Take 25 mg by mouth 3 (three) times daily. 03/20/20   [provider]     Family History  Problem Relation Age of Onset  . Seizures Mother   . Transient ischemic attack Mother   . Glaucoma Mother   . Cancer Father 77       COLON  . Diabetes Father   . Heart disease Father   . Breast cancer Maternal Aunt   . Heart disease Paternal Uncle   . Breast cancer Maternal Aunt   . Glaucoma Maternal Grandmother     Social History   Socioeconomic History  . Marital status: Married    Spouse name: Not on file  . Number of children: Not on file  . Years of education: Not on file  . Highest education level: Not on file  Occupational History  . Not on file  Tobacco Use  . Smoking status: Never Smoker  . Smokeless tobacco: Never Used  Vaping Use  . Vaping Use: Never used  Substance and Sexual Activity  . Alcohol use: Yes    Alcohol/week: 0.0 standard drinks    Comment: RARE  . Drug use: No  . Sexual activity: Not Currently  Other Topics Concern  . Not on file  Social History Narrative  . Not on file   Social Determinants of Health   Financial Resource Strain:   . Difficulty of Paying Living Expenses: Not on file  Food Insecurity:   . Worried About Charity fundraiser in the Last Year: Not on file  . Ran Out of Food in the Last Year: Not on file  Transportation Needs:   . Lack of Transportation (Medical): Not on file  . Lack of Transportation (Non-Medical): Not on file  Physical Activity:   . Days of Exercise per Week: Not on file  . Minutes of Exercise per Session:  Not on file  Stress:   . Feeling of Stress : Not on file  Social Connections:   . Frequency of Communication with Friends and Family: Not on file  . Frequency of Social Gatherings with Friends and Family: Not on file  . Attends Religious Services: Not on file  . Active Member of Clubs or Organizations: Not on file  . Attends Archivist Meetings: Not on file  . Marital Status: Not on file      Review of Systems currently denies fever, headache, chest pain, dyspnea, cough, abdominal/back pain, nausea, vomiting or bleeding.  She does have left hip pain.  Vital Signs: BP 116/81   Pulse 77  Temp 98.1 F (36.7 C) (Oral)   Resp 16   Ht 5\' 7"  (1.702 m)   Wt 150 lb (68 kg)   SpO2 99%   BMI 23.49 kg/m   Physical Exam awake, alert.  Chest clear to auscultation bilaterally.  Heart with regular rate and rhythm.  Abdomen soft, positive bowel sounds, nontender.  No lower extremity edema.  Imaging: CT Abdomen Pelvis Wo Contrast  Result Date: 04/06/2020 CLINICAL DATA:  Lower abdominal pain, diverticulitis EXAM: CT ABDOMEN AND PELVIS WITHOUT CONTRAST TECHNIQUE: Multidetector CT imaging of the abdomen and pelvis was performed following the standard protocol without IV contrast. COMPARISON:  08/22/2017 FINDINGS: Lower chest: The visualized lung bases are clear. The visualized heart and pericardium are unremarkable. Small hiatal hernia. Hepatobiliary: Cholecystectomy has been performed. Mild hepatomegaly, stable since prior examination. No focal liver lesion identified on this noncontrast examination. No intra or extrahepatic biliary ductal dilation. Pancreas: Unremarkable Spleen: Unremarkable Adrenals/Urinary Tract: Adrenal glands are unremarkable. Kidneys are normal. Bladder is unremarkable Stomach/Bowel: The stomach and small bowel are unremarkable. Appendix normal. There is moderate to severe descending and sigmoid colonic diverticulosis. There is superimposed circumferential bowel wall  thickening and pericolonic inflammatory stranding involving the mid sigmoid colon in keeping with changes of acute, mild, uncomplicated sigmoid diverticulitis. This is best seen on axial image # 55 and coronal image # 67. No free intraperitoneal gas or fluid. No loculated intra-abdominal fluid collections. No evidence of obstruction. Vascular/Lymphatic: Mild atherosclerotic calcification within the abdominal aorta. No aortic aneurysm. No pathologic adenopathy within the abdomen and pelvis. Reproductive: Uterus and bilateral adnexa are unremarkable. Other: Rectum unremarkable. Musculoskeletal: Degenerative changes are seen within the lumbar spine with grade 1 anterolisthesis of L4 upon L5 resulting in mild central canal stenosis and moderate to severe left neural foraminal narrowing at this level. Right foraminal disc herniation at L3-4 results in marked right neural foraminal narrowing at this level. This is not well characterized on this examination. No lytic or blastic bone lesion is seen. Advanced degenerative changes are seen within the left hip. IMPRESSION: Acute, mild, uncomplicated sigmoid diverticulitis. Background moderate to severe distal colonic diverticulosis. Degenerative changes within the lumbar spine, not well assessed on this examination, with resultant neural foraminal narrowing and central canal stenosis. This could be better assessed with MRI examination, if indicated. Aortic Atherosclerosis (ICD10-I70.0). Electronically Signed   By: Fidela Salisbury MD   On: 04/06/2020 21:57    Labs:  CBC: Recent Labs    07/18/19 1509 12/07/19 1307 03/22/20 1054 04/06/20 2126  WBC 7.8 7.6 7.4 9.4  HGB 14.0 14.5 15.2* 15.4*  HCT 42.4 43.1 45.5 44.7  PLT 236 219 244 242    COAGS: Recent Labs    12/16/19 1408  INR 1.1  APTT 29    BMP: Recent Labs    05/16/19 1506 05/16/19 1506 07/18/19 1509 12/07/19 1307 03/22/20 1054 04/06/20 2126  NA 137   < > 142 136 136 131*  K 4.0   < > 4.3  4.2 4.0 3.5  CL 100   < > 104 101 99 95*  CO2 26   < > 28 28 28 25   GLUCOSE 105*   < > 105* 99 82 99  BUN 16   < > 21 15 16 10   CALCIUM 10.0   < > 10.7* 10.5* 10.4* 9.5  CREATININE 0.76   < > 0.85 0.67 0.72 0.54  GFRNONAA >60   < > >60 >60 >60 >60  GFRAA >60  --  >  60 >60  --   --    < > = values in this interval not displayed.    LIVER FUNCTION TESTS: Recent Labs    05/16/19 1506 07/18/19 1509 12/07/19 1307 03/22/20 1054  BILITOT 0.5 0.4 0.6 0.4  AST 32 25 30 27   ALT 34 22 32 28  ALKPHOS 73 70 79 76  PROT 7.4 7.1 6.9 7.5  ALBUMIN 4.7 4.5 4.7 4.7    TUMOR MARKERS: No results for input(s): AFPTM, CEA, CA199, CHROMGRNA in the last 8760 hours.  Assessment and Plan:  74 y.o. female with history of poor venous access, hemochromatosis with need for chronic phlebotomies who presents today for Port-A-Cath placement. Risks and benefits of image guided port-a-catheter placement was discussed with the patient including, but not limited to bleeding, infection, pneumothorax, or fibrin sheath development and need for additional procedures.  All of the patient's questions were answered, patient is agreeable to proceed. Consent signed and in chart.     Thank you for this interesting consult.  I greatly enjoyed meeting Lori [name] and look forward to participating in their care.  A copy of this report was sent to the requesting provider on this date.  Electronically Signed: D. Rowe Robert, PA-C 04/27/2020, 1:03 PM   I spent a total of 25 minutes    in face to face in clinical consultation, greater than 50% of which was counseling/coordinating care for Port-A-Cath placement

## 2020-04-27 NOTE — Procedures (Signed)
Interventional Radiology Procedure Note  Procedure: Single Lumen Power Port Placement    Access:  Right IJ vein.  Findings: Catheter tip positioned at SVC/RA junction. Port is ready for immediate use.   Complications: None  EBL: < 10 mL  Recommendations:  - Ok to shower in 24 hours - Do not submerge for 7 days - Routine line care   Xzavian Semmel T. Donathan Buller, M.D Pager:  [phone]   

## 2020-04-27 NOTE — Discharge Instructions (Signed)
Moderate Conscious Sedation, Adult, Care After These instructions provide you with information about caring for yourself after your procedure. Your health care provider may also give you more specific instructions. Your treatment has been planned according to current medical practices, but problems sometimes occur. Call your health care provider if you have any problems or questions after your procedure. What can I expect after the procedure? After your procedure, it is common:  To feel sleepy for several hours.  To feel clumsy and have poor balance for several hours.  To have poor judgment for several hours.  To vomit if you eat too soon. Follow these instructions at home: For at least 24 hours after the procedure:   Do not: ? Participate in activities where you could fall or become injured. ? Drive. ? Use heavy machinery. ? Drink alcohol. ? Take sleeping pills or medicines that cause drowsiness. ? Make important decisions or sign legal documents. ? Take care of children on your own.  Rest. Eating and drinking  Follow the diet recommended by your health care provider.  If you vomit: ? Drink water, juice, or soup when you can drink without vomiting. ? Make sure you have little or no nausea before eating solid foods. General instructions  Have a responsible adult stay with you until you are awake and alert.  Take over-the-counter and prescription medicines only as told by your health care provider.  If you smoke, do not smoke without supervision.  Keep all follow-up visits as told by your health care provider. This is important. Contact a health care provider if:  You keep feeling nauseous or you keep vomiting.  You feel light-headed.  You develop a rash.  You have a fever. Get help right away if:  You have trouble breathing. This information is not intended to replace advice given to you by your health care provider. Make sure you discuss any questions you have  with your health care provider. Document Revised: 04/24/2017 Document Reviewed: 09/01/2015 Elsevier Patient Education  2020 [employer] Implanted Port Home Guide An implanted port is a device that is placed under the skin. It is usually placed in the chest. The device can be used to give IV medicine, to take blood, or for dialysis. You may have an implanted port if:  You need IV medicine that would be irritating to the small veins in your hands or arms.  You need IV medicines, such as antibiotics, for a long period of time.  You need IV nutrition for a long period of time.  You need dialysis. Having a port means that your health care provider will not need to use the veins in your arms for these procedures. You may have fewer limitations when using a port than you would if you used other types of long-term IVs, and you will likely be able to return to normal activities after your incision heals. An implanted port has two main parts:  Reservoir. The reservoir is the part where a needle is inserted to give medicines or draw blood. The reservoir is round. After it is placed, it appears as a small, raised area under your skin.  Catheter. The catheter is a thin, flexible tube that connects the reservoir to a vein. Medicine that is inserted into the reservoir goes into the catheter and then into the vein. How is my port accessed? To access your port:  A numbing cream may be placed on the skin over the port site.  Your health care provider   will put on a mask and sterile gloves.  The skin over your port will be cleaned carefully with a germ-killing soap and allowed to dry.  Your health care provider will gently pinch the port and insert a needle into it.  Your health care provider will check for a blood return to make sure the port is in the vein and is not clogged.  If your port needs to remain accessed to get medicine continuously (constant infusion), your health care provider will place  a clear bandage (dressing) over the needle site. The dressing and needle will need to be changed every week, or as told by your health care provider. What is flushing? Flushing helps keep the port from getting clogged. Follow instructions from your health care provider about how and when to flush the port. Ports are usually flushed with saline solution or a medicine called heparin. The need for flushing will depend on how the port is used:  If the port is only used from time to time to give medicines or draw blood, the port may need to be flushed: ? Before and after medicines have been given. ? Before and after blood has been drawn. ? As part of routine maintenance. Flushing may be recommended every 4-6 weeks.  If a constant infusion is running, the port may not need to be flushed.  Throw away any syringes in a disposal container that is meant for sharp items (sharps container). You can buy a sharps container from a pharmacy, or you can make one by using an empty hard plastic bottle with a cover. How long will my port stay implanted? The port can stay in for as long as your health care provider thinks it is needed. When it is time for the port to come out, a surgery will be done to remove it. The surgery will be similar to the procedure that was done to put the port in. Follow these instructions at home:   Flush your port as told by your health care provider.  If you need an infusion over several days, follow instructions from your health care provider about how to take care of your port site. Make sure you: ? Wash your hands with soap and water before you change your dressing. If soap and water are not available, use alcohol-based hand sanitizer. ? Change your dressing as told by your health care provider. ? Place any used dressings or infusion bags into a plastic bag. Throw that bag in the trash. ? Keep the dressing that covers the needle clean and dry. Do not get it wet. ? Do not use  scissors or sharp objects near the tube. ? Keep the tube clamped, unless it is being used.  Check your port site every day for signs of infection. Check for: ? Redness, swelling, or pain. ? Fluid or blood. ? Pus or a bad smell.  Protect the skin around the port site. ? Avoid wearing bra straps that rub or irritate the site. ? Protect the skin around your port from seat belts. Place a soft pad over your chest if needed.  Bathe or shower as told by your health care provider. The site may get wet as long as you are not actively receiving an infusion.  Return to your normal activities as told by your health care provider. Ask your health care provider what activities are safe for you.  Carry a medical alert card or wear a medical alert bracelet at all times. This will   let health care providers know that you have an implanted port in case of an emergency. Get help right away if:  You have redness, swelling, or pain at the port site.  You have fluid or blood coming from your port site.  You have pus or a bad smell coming from the port site.  You have a fever. Summary  Implanted ports are usually placed in the chest for long-term IV access.  Follow instructions from your health care provider about flushing the port and changing bandages (dressings).  Take care of the area around your port by avoiding clothing that puts pressure on the area, and by watching for signs of infection.  Protect the skin around your port from seat belts. Place a soft pad over your chest if needed.  Get help right away if you have a fever or you have redness, swelling, pain, drainage, or a bad smell at the port site. This information is not intended to replace advice given to you by your health care provider. Make sure you discuss any questions you have with your health care provider. Document Revised: 09/03/2018 Document Reviewed: 06/14/2016 Elsevier Patient Education  2020 [employer] Inc[employer]  

## 2020-04-30 ENCOUNTER — Other Ambulatory Visit: Payer: Self-pay

## 2020-04-30 ENCOUNTER — Inpatient Hospital Stay: Payer: PPO | Attending: Hematology & Oncology

## 2020-04-30 VITALS — BP 118/72 | HR 62 | Temp 98.1°F | Resp 17

## 2020-04-30 DIAGNOSIS — Z95828 Presence of other vascular implants and grafts: Secondary | ICD-10-CM

## 2020-04-30 MED ORDER — HEPARIN SOD (PORK) LOCK FLUSH 100 UNIT/ML IV SOLN
500.0000 [IU] | Freq: Once | INTRAVENOUS | Status: AC
  Administered 2020-04-30: 500 [IU] via INTRAVENOUS
  Filled 2020-04-30: qty 5

## 2020-04-30 MED ORDER — SODIUM CHLORIDE 0.9% FLUSH
10.0000 mL | Freq: Once | INTRAVENOUS | Status: AC
  Administered 2020-04-30: 10 mL via INTRAVENOUS
  Filled 2020-04-30: qty 10

## 2020-04-30 NOTE — Patient Instructions (Signed)
Therapeutic Phlebotomy Therapeutic phlebotomy is the planned removal of blood from a person's body for the purpose of treating a medical condition. The procedure is similar to donating blood. Usually, about a pint (470 mL, or 0.47 L) of blood is removed. The average adult has 9-12 pints (4.3-5.7 L) of blood in the body. Therapeutic phlebotomy may be used to treat the following medical conditions:  Hemochromatosis. This is a condition in which the blood contains too much iron.  Polycythemia vera. This is a condition in which the blood contains too many red blood cells.  Porphyria cutanea tarda. This is a disease in which an important part of hemoglobin is not made properly. It results in the buildup of abnormal amounts of porphyrins in the body.  Sickle cell disease. This is a condition in which the red blood cells form an abnormal crescent shape rather than a round shape. Tell a health care provider about:  Any allergies you have.  All medicines you are taking, including vitamins, herbs, eye drops, creams, and over-the-counter medicines.  Any problems you or family members have had with anesthetic medicines.  Any blood disorders you have.  Any surgeries you have had.  Any medical conditions you have.  Whether you are pregnant or may be pregnant. What are the risks? Generally, this is a safe procedure. However, problems may occur, including:  Nausea or light-headedness.  Low blood pressure (hypotension).  Soreness, bleeding, swelling, or bruising at the needle insertion site.  Infection. What happens before the procedure?  Follow instructions from your health care provider about eating or drinking restrictions.  Ask your health care provider about: ? Changing or stopping your regular medicines. This is especially important if you are taking diabetes medicines or blood thinners (anticoagulants). ? Taking medicines such as aspirin and ibuprofen. These medicines can thin your  blood. Do not take these medicines unless your health care provider tells you to take them. ? Taking over-the-counter medicines, vitamins, herbs, and supplements.  Wear clothing with sleeves that can be raised above the elbow.  Plan to have someone take you home from the hospital or clinic.  You may have a blood sample taken.  Your blood pressure, pulse rate, and breathing rate will be measured. What happens during the procedure?   To lower your risk of infection: ? Your health care team will wash or sanitize their hands. ? Your skin will be cleaned with an antiseptic.  You may be given a medicine to numb the area (local anesthetic).  A tourniquet will be placed on your arm.  A needle will be inserted into one of your veins.  Tubing and a collection bag will be attached to that needle.  Blood will flow through the needle and tubing into the collection bag.  The collection bag will be placed lower than your arm to allow gravity to help the flow of blood into the bag.  You may be asked to open and close your hand slowly and continually during the entire collection.  After the specified amount of blood has been removed from your body, the collection bag and tubing will be clamped.  The needle will be removed from your vein.  Pressure will be held on the site of the needle insertion to stop the bleeding.  A bandage (dressing) will be placed over the needle insertion site. The procedure may vary among health care providers and hospitals. What happens after the procedure?  Your blood pressure, pulse rate, and breathing rate will be   measured after the procedure.  You will be encouraged to drink fluids.  Your recovery will be assessed and monitored.  You can return to your normal activities as told by your health care provider. Summary  Therapeutic phlebotomy is the planned removal of blood from a person's body for the purpose of treating a medical condition.  Therapeutic  phlebotomy may be used to treat hemochromatosis, polycythemia vera, porphyria cutanea tarda, or sickle cell disease.  In the procedure, a needle is inserted and about a pint (470 mL, or 0.47 L) of blood is removed. The average adult has 9-12 pints (4.3-5.7 L) of blood in the body.  This is generally a safe procedure, but it can sometimes cause problems such as nausea, light-headedness, or low blood pressure (hypotension). This information is not intended to replace advice given to you by your health care provider. Make sure you discuss any questions you have with your health care provider. Document Revised: 05/28/2017 Document Reviewed: 05/28/2017 Elsevier Patient Education  2020 Elsevier Inc.  

## 2020-05-01 DIAGNOSIS — Z1212 Encounter for screening for malignant neoplasm of rectum: Secondary | ICD-10-CM | POA: Diagnosis not present

## 2020-05-01 DIAGNOSIS — M1612 Unilateral primary osteoarthritis, left hip: Secondary | ICD-10-CM | POA: Diagnosis not present

## 2020-05-28 DIAGNOSIS — R531 Weakness: Secondary | ICD-10-CM | POA: Diagnosis not present

## 2020-05-28 DIAGNOSIS — R7301 Impaired fasting glucose: Secondary | ICD-10-CM | POA: Diagnosis not present

## 2020-05-28 DIAGNOSIS — R5382 Chronic fatigue, unspecified: Secondary | ICD-10-CM | POA: Diagnosis not present

## 2020-05-28 DIAGNOSIS — E039 Hypothyroidism, unspecified: Secondary | ICD-10-CM | POA: Diagnosis not present

## 2020-05-28 DIAGNOSIS — Z148 Genetic carrier of other disease: Secondary | ICD-10-CM | POA: Diagnosis not present

## 2020-05-28 DIAGNOSIS — K219 Gastro-esophageal reflux disease without esophagitis: Secondary | ICD-10-CM | POA: Diagnosis not present

## 2020-06-01 DIAGNOSIS — R059 Cough, unspecified: Secondary | ICD-10-CM | POA: Diagnosis not present

## 2020-06-02 DIAGNOSIS — Z1152 Encounter for screening for COVID-19: Secondary | ICD-10-CM | POA: Diagnosis not present

## 2020-06-19 DIAGNOSIS — Z96642 Presence of left artificial hip joint: Secondary | ICD-10-CM | POA: Diagnosis not present

## 2020-06-19 DIAGNOSIS — Z471 Aftercare following joint replacement surgery: Secondary | ICD-10-CM | POA: Diagnosis not present

## 2020-06-22 ENCOUNTER — Other Ambulatory Visit: Payer: PPO

## 2020-06-22 ENCOUNTER — Telehealth: Payer: Self-pay

## 2020-06-22 ENCOUNTER — Inpatient Hospital Stay: Payer: PPO

## 2020-06-22 NOTE — Telephone Encounter (Signed)
Pt called and left a vm to cancel todays lab and flush as she has a cough and a sore throat, she states she will  C/b when better      Lori [name]

## 2020-07-02 DIAGNOSIS — H524 Presbyopia: Secondary | ICD-10-CM | POA: Diagnosis not present

## 2020-07-02 DIAGNOSIS — H40013 Open angle with borderline findings, low risk, bilateral: Secondary | ICD-10-CM | POA: Diagnosis not present

## 2020-07-02 DIAGNOSIS — H43812 Vitreous degeneration, left eye: Secondary | ICD-10-CM | POA: Diagnosis not present

## 2020-07-02 DIAGNOSIS — H2513 Age-related nuclear cataract, bilateral: Secondary | ICD-10-CM | POA: Diagnosis not present

## 2020-07-02 DIAGNOSIS — H33199 Other retinoschisis and retinal cysts, unspecified eye: Secondary | ICD-10-CM | POA: Diagnosis not present

## 2020-07-11 DIAGNOSIS — Z8616 Personal history of COVID-19: Secondary | ICD-10-CM | POA: Diagnosis not present

## 2020-07-11 DIAGNOSIS — K1120 Sialoadenitis, unspecified: Secondary | ICD-10-CM | POA: Diagnosis not present

## 2020-08-02 ENCOUNTER — Telehealth: Payer: Self-pay

## 2020-08-02 NOTE — Telephone Encounter (Signed)
Returned pts call to r/s her lab and flush that she cancelled back in 05/2020    [facility]

## 2020-08-03 ENCOUNTER — Inpatient Hospital Stay: Payer: PPO

## 2020-08-03 ENCOUNTER — Inpatient Hospital Stay: Payer: PPO | Attending: Hematology & Oncology

## 2020-08-03 ENCOUNTER — Other Ambulatory Visit: Payer: Self-pay

## 2020-08-03 DIAGNOSIS — Z95828 Presence of other vascular implants and grafts: Secondary | ICD-10-CM

## 2020-08-03 LAB — CMP (CANCER CENTER ONLY)
ALT: 20 U/L (ref 0–44)
AST: 26 U/L (ref 15–41)
Albumin: 4.6 g/dL (ref 3.5–5.0)
Alkaline Phosphatase: 86 U/L (ref 38–126)
Anion gap: 8 (ref 5–15)
BUN: 15 mg/dL (ref 8–23)
CO2: 27 mmol/L (ref 22–32)
Calcium: 10.2 mg/dL (ref 8.9–10.3)
Chloride: 96 mmol/L — ABNORMAL LOW (ref 98–111)
Creatinine: 0.74 mg/dL (ref 0.44–1.00)
GFR, Estimated: >60 mL/min (ref >60)
Glucose, Bld: 86 mg/dL (ref 70–99)
Potassium: 3.7 mmol/L (ref 3.5–5.1)
Sodium: 131 mmol/L — ABNORMAL LOW (ref 135–145)
Total Bilirubin: 0.6 mg/dL (ref 0.3–1.2)
Total Protein: 6.9 g/dL (ref 6.5–8.1)

## 2020-08-03 LAB — CBC WITH DIFFERENTIAL (CANCER CENTER ONLY)
Abs Immature Granulocytes: 0.05 10*3/uL (ref 0.00–0.07)
Basophils Absolute: 0 10*3/uL (ref 0.0–0.1)
Basophils Relative: 1 %
Eosinophils Absolute: 0 10*3/uL (ref 0.0–0.5)
Eosinophils Relative: 1 %
HCT: 40.8 % (ref 36.0–46.0)
Hemoglobin: 14.1 g/dL (ref 12.0–15.0)
Immature Granulocytes: 1 %
Lymphocytes Relative: 43 %
Lymphs Abs: 2.9 10*3/uL (ref 0.7–4.0)
MCH: 31.7 pg (ref 26.0–34.0)
MCHC: 34.6 g/dL (ref 30.0–36.0)
MCV: 91.7 fL (ref 80.0–100.0)
Monocytes Absolute: 0.7 10*3/uL (ref 0.1–1.0)
Monocytes Relative: 11 %
Neutro Abs: 2.9 10*3/uL (ref 1.7–7.7)
Neutrophils Relative %: 43 %
Platelet Count: 236 10*3/uL (ref 150–400)
RBC: 4.45 MIL/uL (ref 3.87–5.11)
RDW: 12.6 % (ref 11.5–15.5)
WBC Count: 6.7 10*3/uL (ref 4.0–10.5)
nRBC: 0 % (ref 0.0–0.2)

## 2020-08-03 MED ORDER — HEPARIN SOD (PORK) LOCK FLUSH 100 UNIT/ML IV SOLN
500.0000 [IU] | Freq: Once | INTRAVENOUS | Status: AC
  Administered 2020-08-03: 500 [IU] via INTRAVENOUS
  Filled 2020-08-03: qty 5

## 2020-08-03 MED ORDER — SODIUM CHLORIDE 0.9% FLUSH
10.0000 mL | Freq: Once | INTRAVENOUS | Status: AC
  Administered 2020-08-03: 10 mL via INTRAVENOUS
  Filled 2020-08-03: qty 10

## 2020-08-03 NOTE — Patient Instructions (Signed)
Tunneled Central Venous Catheter Flushing Guide  It is important to flush your tunneled central venous catheter each time you use it, both before and after you use it. Flushing your catheter will help prevent it from clogging. What are the risks? Risks may include:  Infection.  Air getting into the catheter and bloodstream. Supplies needed:  A clean pair of gloves.  A disinfecting wipe. Use an alcohol wipe, chlorhexidine wipe, or iodine wipe as told by your health care provider.  A 10 mL syringe that has been prefilled with saline solution.  An empty 10 mL syringe, if a substance called heparin was injected into your catheter. How to flush your catheter When you flush your catheter, make sure you follow any specific instructions from your health care provider or the manufacturer. These are general guidelines. Flushing your catheter before use If there is heparin in your catheter: 1. Wash your hands with soap and water. 2. Put on gloves. 3. Scrub the injection cap for a minimum of 15 seconds with a disinfecting wipe. 4. Unclamp the catheter. 5. Attach the empty syringe to the injection cap. 6. Pull the syringe plunger back and withdraw 10 mL of blood. 7. Place the syringe into an appropriate waste container. 8. Scrub the injection cap for 15 seconds with a disinfecting wipe. 9. Attach the prefilled syringe to the injection cap. 10. Flush the catheter by pushing the plunger forward until all the liquid from the syringe is in the catheter. 11. Remove the syringe from the injection cap. 12. Clamp the catheter. If there is no heparin in your catheter: 1. Wash your hands with soap and water. 2. Put on gloves. 3. Scrub the injection cap for 15 seconds with a disinfecting wipe. 4. Unclamp the catheter. 5. Attach the prefilled syringe to the injection cap. 6. Flush the catheter by pushing the plunger forward until 5 mL of the liquid from the syringe is in the catheter. 7. Pull back on  the syringe until you see blood in the catheter. 8. If you have been asked to collect any blood, follow your health care provider's instructions. Otherwise, flush the catheter with the rest of the solution from the syringe. 9. Remove the syringe from the injection cap. 10. Clamp the catheter.   Flushing your catheter after use 1. Wash your hands with soap and water. 2. Put on gloves. 3. Scrub the injection cap for 15 seconds with a disinfecting wipe. 4. Unclamp the catheter. 5. Attach the prefilled syringe to the injection cap. 6. Flush the catheter by pushing the plunger forward until all of the liquid from the syringe is in the catheter. 7. Remove the syringe from the injection cap. 8. Clamp the catheter. Problems and solutions  If blood cannot be completely cleared from the injection cap, you may need to have the injection cap replaced.  If the catheter is difficult to flush, use the pulsing method. The pulsing method involves pushing only a few milliliters of solution into the catheter at a time and pausing between pushes.  If you do not see blood in the catheter when you pull back on the syringe, change your body position, such as by raising your arms above your head. Take a deep breath and cough. Then, pull back on the syringe. If you still do not see blood, flush the catheter with a small amount of solution. Then, change positions again and take a breath or cough. Pull back on the syringe again. If you still do not   see blood, finish flushing the catheter and contact your health care provider. Do not use your catheter until your health care provider says it is okay. General tips  Have someone help you flush your catheter, if possible.  Do not force fluid through your catheter.  Do not use a syringe that is larger or smaller than 10 mL. Using a smaller syringe can make the catheter burst.  Do not use your catheter without flushing it first if it has heparin in it. Contact a health  care provider if:  You cannot see any blood in the catheter when you flush it before using it.  Your catheter is difficult to flush. Get help right away if:  You cannot flush the catheter.  The catheter leaks when you flush it or when there is fluid in it.  There are cracks or breaks in the catheter. Summary  It is important to flush your tunneled central venous catheter each time you use it, both before and after you use it.  Scrub the injection cap for 15 seconds with a disinfecting wipe before and after you flush it.  When you flush your catheter, make sure you follow any specific instructions from your health care provider or the manufacturer.  Get help right away if you cannot flush the catheter. This information is not intended to replace advice given to you by your health care provider. Make sure you discuss any questions you have with your health care provider. Document Revised: 07/21/2019 Document Reviewed: 07/28/2018 Elsevier Patient Education  2021 [employer]  

## 2020-08-06 LAB — IRON AND TIBC
Iron: 104 ug/dL (ref 41–142)
Saturation Ratios: 33 % (ref 21–57)
TIBC: 311 ug/dL (ref 236–444)
UIBC: 207 ug/dL (ref 120–384)

## 2020-08-06 LAB — FERRITIN: Ferritin: 19 ng/mL (ref 11–307)

## 2020-08-20 ENCOUNTER — Telehealth: Payer: Self-pay | Admitting: *Deleted

## 2020-08-20 NOTE — Telephone Encounter (Signed)
Message received from patient wanting to know if she needs a phlebotomy from latest blood work on 08/03/20.  Call placed back to patient and patient notified that no phlebotomy is needed at this time per order of Dr. Marin Olp.  Pt is appreciative of call and has no further questions at this time.

## 2020-08-27 DIAGNOSIS — M3210 Systemic lupus erythematosus, organ or system involvement unspecified: Secondary | ICD-10-CM | POA: Diagnosis not present

## 2020-08-27 DIAGNOSIS — Z86010 Personal history of colonic polyps: Secondary | ICD-10-CM | POA: Diagnosis not present

## 2020-09-03 DIAGNOSIS — M9903 Segmental and somatic dysfunction of lumbar region: Secondary | ICD-10-CM | POA: Diagnosis not present

## 2020-09-03 DIAGNOSIS — M62830 Muscle spasm of back: Secondary | ICD-10-CM | POA: Diagnosis not present

## 2020-09-20 ENCOUNTER — Other Ambulatory Visit: Payer: Self-pay

## 2020-09-20 ENCOUNTER — Inpatient Hospital Stay: Payer: PPO | Attending: Hematology & Oncology

## 2020-09-20 ENCOUNTER — Inpatient Hospital Stay: Payer: PPO

## 2020-09-20 VITALS — BP 118/56 | HR 76 | Temp 98.5°F | Resp 18

## 2020-09-20 DIAGNOSIS — Z95828 Presence of other vascular implants and grafts: Secondary | ICD-10-CM

## 2020-09-20 LAB — CBC WITH DIFFERENTIAL (CANCER CENTER ONLY)
Abs Immature Granulocytes: 0.02 10*3/uL (ref 0.00–0.07)
Basophils Absolute: 0 10*3/uL (ref 0.0–0.1)
Basophils Relative: 0 %
Eosinophils Absolute: 0.1 10*3/uL (ref 0.0–0.5)
Eosinophils Relative: 1 %
HCT: 40.1 % (ref 36.0–46.0)
Hemoglobin: 13.7 g/dL (ref 12.0–15.0)
Immature Granulocytes: 0 %
Lymphocytes Relative: 36 %
Lymphs Abs: 2.5 10*3/uL (ref 0.7–4.0)
MCH: 31.9 pg (ref 26.0–34.0)
MCHC: 34.2 g/dL (ref 30.0–36.0)
MCV: 93.3 fL (ref 80.0–100.0)
Monocytes Absolute: 0.7 10*3/uL (ref 0.1–1.0)
Monocytes Relative: 10 %
Neutro Abs: 3.7 10*3/uL (ref 1.7–7.7)
Neutrophils Relative %: 53 %
Platelet Count: 216 10*3/uL (ref 150–400)
RBC: 4.3 MIL/uL (ref 3.87–5.11)
RDW: 13.6 % (ref 11.5–15.5)
WBC Count: 7 10*3/uL (ref 4.0–10.5)
nRBC: 0 % (ref 0.0–0.2)

## 2020-09-20 LAB — CMP (CANCER CENTER ONLY)
ALT: 23 U/L (ref 0–44)
AST: 24 U/L (ref 15–41)
Albumin: 4.1 g/dL (ref 3.5–5.0)
Alkaline Phosphatase: 59 U/L (ref 38–126)
Anion gap: 6 (ref 5–15)
BUN: 10 mg/dL (ref 8–23)
CO2: 27 mmol/L (ref 22–32)
Calcium: 9.7 mg/dL (ref 8.9–10.3)
Chloride: 104 mmol/L (ref 98–111)
Creatinine: 0.7 mg/dL (ref 0.44–1.00)
GFR, Estimated: >60 mL/min (ref >60)
Glucose, Bld: 95 mg/dL (ref 70–99)
Potassium: 3.9 mmol/L (ref 3.5–5.1)
Sodium: 137 mmol/L (ref 135–145)
Total Bilirubin: 0.6 mg/dL (ref 0.3–1.2)
Total Protein: 6.4 g/dL — ABNORMAL LOW (ref 6.5–8.1)

## 2020-09-20 MED ORDER — SODIUM CHLORIDE 0.9% FLUSH
10.0000 mL | Freq: Once | INTRAVENOUS | Status: AC
  Administered 2020-09-20: 10 mL via INTRAVENOUS
  Filled 2020-09-20: qty 10

## 2020-09-20 MED ORDER — HEPARIN SOD (PORK) LOCK FLUSH 100 UNIT/ML IV SOLN
500.0000 [IU] | Freq: Once | INTRAVENOUS | Status: AC
  Administered 2020-09-20: 500 [IU] via INTRAVENOUS
  Filled 2020-09-20: qty 5

## 2020-09-20 NOTE — Patient Instructions (Signed)
Tunneled Central Venous Catheter Flushing Guide  It is important to flush your tunneled central venous catheter each time you use it, both before and after you use it. Flushing your catheter will help prevent it from clogging. What are the risks? Risks may include:  Infection.  Air getting into the catheter and bloodstream. Supplies needed:  A clean pair of gloves.  A disinfecting wipe. Use an alcohol wipe, chlorhexidine wipe, or iodine wipe as told by your health care provider.  A 10 mL syringe that has been prefilled with saline solution.  An empty 10 mL syringe, if a substance called heparin was injected into your catheter. How to flush your catheter When you flush your catheter, make sure you follow any specific instructions from your health care provider or the manufacturer. These are general guidelines. Flushing your catheter before use If there is heparin in your catheter: 1. Wash your hands with soap and water. 2. Put on gloves. 3. Scrub the injection cap for a minimum of 15 seconds with a disinfecting wipe. 4. Unclamp the catheter. 5. Attach the empty syringe to the injection cap. 6. Pull the syringe plunger back and withdraw 10 mL of blood. 7. Place the syringe into an appropriate waste container. 8. Scrub the injection cap for 15 seconds with a disinfecting wipe. 9. Attach the prefilled syringe to the injection cap. 10. Flush the catheter by pushing the plunger forward until all the liquid from the syringe is in the catheter. 11. Remove the syringe from the injection cap. 12. Clamp the catheter. If there is no heparin in your catheter: 1. Wash your hands with soap and water. 2. Put on gloves. 3. Scrub the injection cap for 15 seconds with a disinfecting wipe. 4. Unclamp the catheter. 5. Attach the prefilled syringe to the injection cap. 6. Flush the catheter by pushing the plunger forward until 5 mL of the liquid from the syringe is in the catheter. 7. Pull back on  the syringe until you see blood in the catheter. 8. If you have been asked to collect any blood, follow your health care provider's instructions. Otherwise, flush the catheter with the rest of the solution from the syringe. 9. Remove the syringe from the injection cap. 10. Clamp the catheter.   Flushing your catheter after use 1. Wash your hands with soap and water. 2. Put on gloves. 3. Scrub the injection cap for 15 seconds with a disinfecting wipe. 4. Unclamp the catheter. 5. Attach the prefilled syringe to the injection cap. 6. Flush the catheter by pushing the plunger forward until all of the liquid from the syringe is in the catheter. 7. Remove the syringe from the injection cap. 8. Clamp the catheter. Problems and solutions  If blood cannot be completely cleared from the injection cap, you may need to have the injection cap replaced.  If the catheter is difficult to flush, use the pulsing method. The pulsing method involves pushing only a few milliliters of solution into the catheter at a time and pausing between pushes.  If you do not see blood in the catheter when you pull back on the syringe, change your body position, such as by raising your arms above your head. Take a deep breath and cough. Then, pull back on the syringe. If you still do not see blood, flush the catheter with a small amount of solution. Then, change positions again and take a breath or cough. Pull back on the syringe again. If you still do not   see blood, finish flushing the catheter and contact your health care provider. Do not use your catheter until your health care provider says it is okay. General tips  Have someone help you flush your catheter, if possible.  Do not force fluid through your catheter.  Do not use a syringe that is larger or smaller than 10 mL. Using a smaller syringe can make the catheter burst.  Do not use your catheter without flushing it first if it has heparin in it. Contact a health  care provider if:  You cannot see any blood in the catheter when you flush it before using it.  Your catheter is difficult to flush. Get help right away if:  You cannot flush the catheter.  The catheter leaks when you flush it or when there is fluid in it.  There are cracks or breaks in the catheter. Summary  It is important to flush your tunneled central venous catheter each time you use it, both before and after you use it.  Scrub the injection cap for 15 seconds with a disinfecting wipe before and after you flush it.  When you flush your catheter, make sure you follow any specific instructions from your health care provider or the manufacturer.  Get help right away if you cannot flush the catheter. This information is not intended to replace advice given to you by your health care provider. Make sure you discuss any questions you have with your health care provider. Document Revised: 07/21/2019 Document Reviewed: 07/28/2018 Elsevier Patient Education  2021 [employer]  

## 2020-09-21 LAB — IRON AND TIBC
Iron: 191 ug/dL — ABNORMAL HIGH (ref 41–142)
Saturation Ratios: 81 % — ABNORMAL HIGH (ref 21–57)
TIBC: 236 ug/dL (ref 236–444)
UIBC: 45 ug/dL — ABNORMAL LOW (ref 120–384)

## 2020-09-21 LAB — FERRITIN: Ferritin: 48 ng/mL (ref 11–307)

## 2020-09-24 ENCOUNTER — Telehealth: Payer: Self-pay

## 2020-09-24 NOTE — Telephone Encounter (Signed)
Called and left a vm for pt to have one phlebot per 5/2 message   Lori [name]

## 2020-09-27 ENCOUNTER — Other Ambulatory Visit: Payer: Self-pay

## 2020-09-27 ENCOUNTER — Inpatient Hospital Stay: Payer: PPO | Attending: Hematology & Oncology

## 2020-09-27 NOTE — Progress Notes (Signed)
Lori [name] presents today for phleboomy per MD orders. Phlebotomy procedure started at 1418 and ended at  500 grams removed. Patient observed for 30 minutes after procedure without any incident. Patient tolerated procedure well. IV needle removed intact.

## 2020-09-27 NOTE — Patient Instructions (Signed)
Therapeutic Phlebotomy Therapeutic phlebotomy is the planned removal of blood from a person's body for the purpose of treating a medical condition. The procedure is similar to donating blood. Usually, about a pint (470 mL, or 0.47 L) of blood is removed. The average adult has 9-12 pints (4.3-5.7 L) of blood in the body. Therapeutic phlebotomy may be used to treat the following medical conditions:  Hemochromatosis. This is a condition in which the blood contains too much iron.  Polycythemia vera. This is a condition in which the blood contains too many red blood cells.  Porphyria cutanea tarda. This is a disease in which an important part of hemoglobin is not made properly. It results in the buildup of abnormal amounts of porphyrins in the body.  Sickle cell disease. This is a condition in which the red blood cells form an abnormal crescent shape rather than a round shape. Tell a health care provider about:  Any allergies you have.  All medicines you are taking, including vitamins, herbs, eye drops, creams, and over-the-counter medicines.  Any problems you or family members have had with anesthetic medicines.  Any blood disorders you have.  Any surgeries you have had.  Any medical conditions you have.  Whether you are pregnant or may be pregnant. What are the risks? Generally, this is a safe procedure. However, problems may occur, including:  Nausea or light-headedness.  Low blood pressure (hypotension).  Soreness, bleeding, swelling, or bruising at the needle insertion site.  Infection. What happens before the procedure?  Follow instructions from your health care provider about eating or drinking restrictions.  Ask your health care provider about: ? Changing or stopping your regular medicines. This is especially important if you are taking diabetes medicines or blood thinners (anticoagulants). ? Taking medicines such as aspirin and ibuprofen. These medicines can thin your  blood. Do not take these medicines unless your health care provider tells you to take them. ? Taking over-the-counter medicines, vitamins, herbs, and supplements.  Wear clothing with sleeves that can be raised above the elbow.  Plan to have someone take you home from the hospital or clinic.  You may have a blood sample taken.  Your blood pressure, pulse rate, and breathing rate will be measured. What happens during the procedure?  To lower your risk of infection: ? Your health care team will wash or sanitize their hands. ? Your skin will be cleaned with an antiseptic.  You may be given a medicine to numb the area (local anesthetic).  A tourniquet will be placed on your arm.  A needle will be inserted into one of your veins.  Tubing and a collection bag will be attached to that needle.  Blood will flow through the needle and tubing into the collection bag.  The collection bag will be placed lower than your arm to allow gravity to help the flow of blood into the bag.  You may be asked to open and close your hand slowly and continually during the entire collection.  After the specified amount of blood has been removed from your body, the collection bag and tubing will be clamped.  The needle will be removed from your vein.  Pressure will be held on the site of the needle insertion to stop the bleeding.  A bandage (dressing) will be placed over the needle insertion site. The procedure may vary among health care providers and hospitals.   What happens after the procedure?  Your blood pressure, pulse rate, and breathing rate will   be measured after the procedure.  You will be encouraged to drink fluids.  Your recovery will be assessed and monitored.  You can return to your normal activities as told by your health care provider. Summary  Therapeutic phlebotomy is the planned removal of blood from a person's body for the purpose of treating a medical condition.  Therapeutic  phlebotomy may be used to treat hemochromatosis, polycythemia vera, porphyria cutanea tarda, or sickle cell disease.  In the procedure, a needle is inserted and about a pint (470 mL, or 0.47 L) of blood is removed. The average adult has 9-12 pints (4.3-5.7 L) of blood in the body.  This is generally a safe procedure, but it can sometimes cause problems such as nausea, light-headedness, or low blood pressure (hypotension). This information is not intended to replace advice given to you by your health care provider. Make sure you discuss any questions you have with your health care provider. Document Revised: 05/28/2017 Document Reviewed: 05/28/2017 Elsevier Patient Education  2021 [employer].  

## 2020-10-02 ENCOUNTER — Telehealth: Payer: Self-pay | Admitting: *Deleted

## 2020-10-02 NOTE — Telephone Encounter (Signed)
Returned call to pt , discussed lab results. No concerns at this time.

## 2020-10-09 DIAGNOSIS — D124 Benign neoplasm of descending colon: Secondary | ICD-10-CM | POA: Diagnosis not present

## 2020-10-09 DIAGNOSIS — D122 Benign neoplasm of ascending colon: Secondary | ICD-10-CM | POA: Diagnosis not present

## 2020-10-09 DIAGNOSIS — K5730 Diverticulosis of large intestine without perforation or abscess without bleeding: Secondary | ICD-10-CM | POA: Diagnosis not present

## 2020-10-09 DIAGNOSIS — Z86010 Personal history of colonic polyps: Secondary | ICD-10-CM | POA: Diagnosis not present

## 2020-10-09 DIAGNOSIS — D120 Benign neoplasm of cecum: Secondary | ICD-10-CM | POA: Diagnosis not present

## 2020-10-09 DIAGNOSIS — K6389 Other specified diseases of intestine: Secondary | ICD-10-CM | POA: Diagnosis not present

## 2020-10-09 DIAGNOSIS — K635 Polyp of colon: Secondary | ICD-10-CM | POA: Diagnosis not present

## 2020-10-16 DIAGNOSIS — L821 Other seborrheic keratosis: Secondary | ICD-10-CM | POA: Diagnosis not present

## 2020-10-16 DIAGNOSIS — Z85828 Personal history of other malignant neoplasm of skin: Secondary | ICD-10-CM | POA: Diagnosis not present

## 2020-10-16 DIAGNOSIS — L650 Telogen effluvium: Secondary | ICD-10-CM | POA: Diagnosis not present

## 2020-10-26 IMAGING — MG DIGITAL SCREENING BILAT W/ TOMO W/ CAD
8 series · 8 of 24 positions shown · non-contrast
Comparison: Previous exam(s).

CLINICAL DATA: Screening.

EXAM:
DIGITAL SCREENING BILATERAL MAMMOGRAM WITH TOMO AND CAD

[R CC synth-2D]
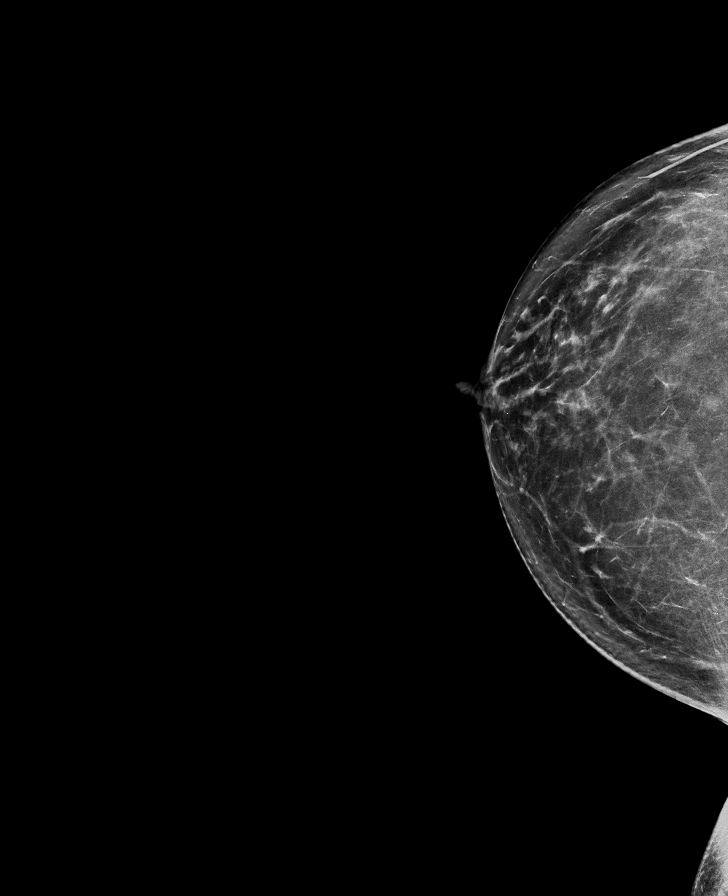

[L CC synth-2D]
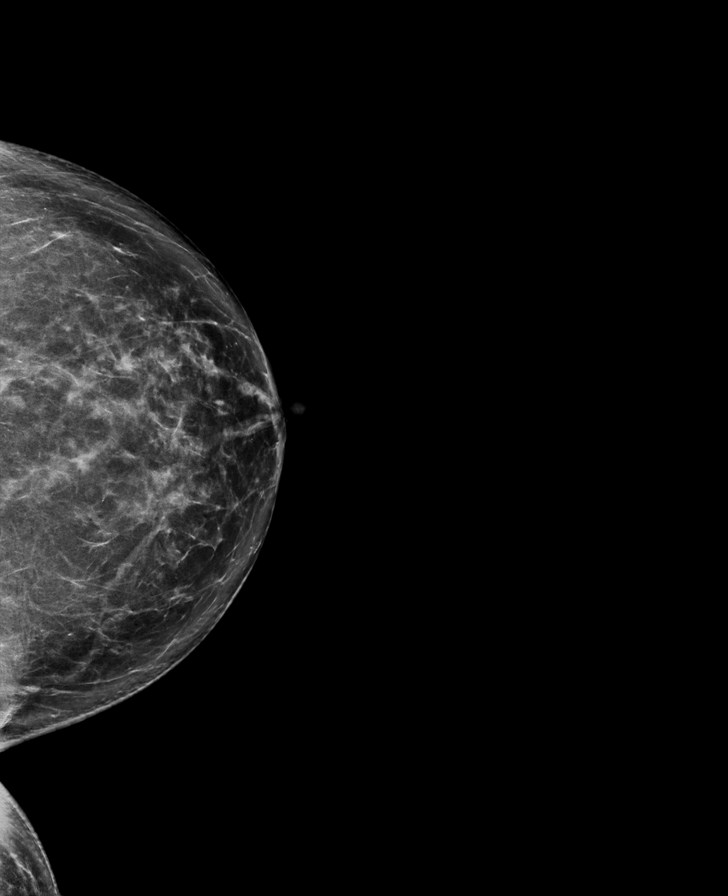

[R MLO synth-2D]
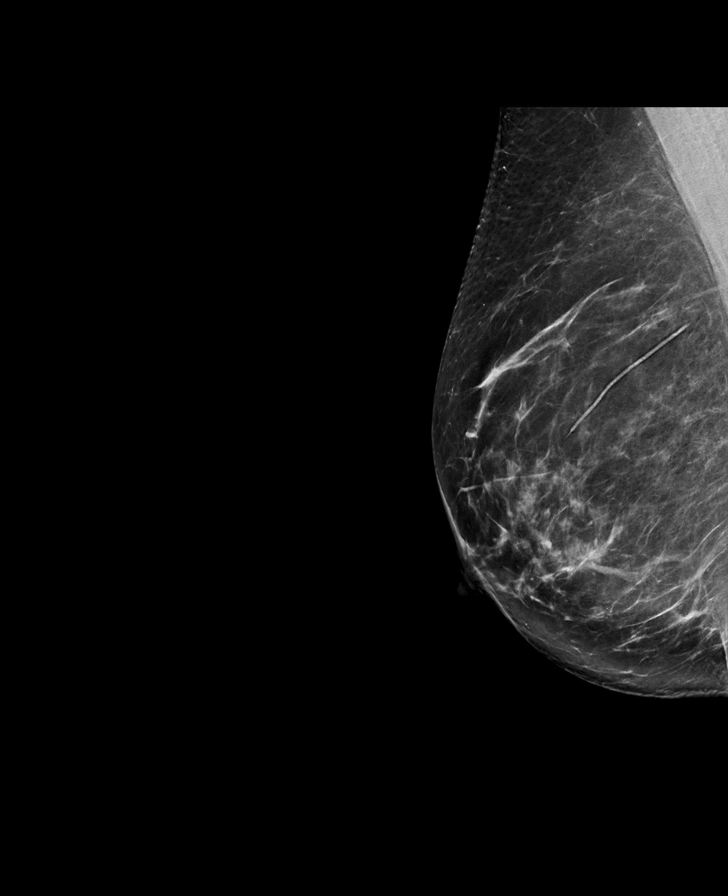

[L MLO synth-2D]
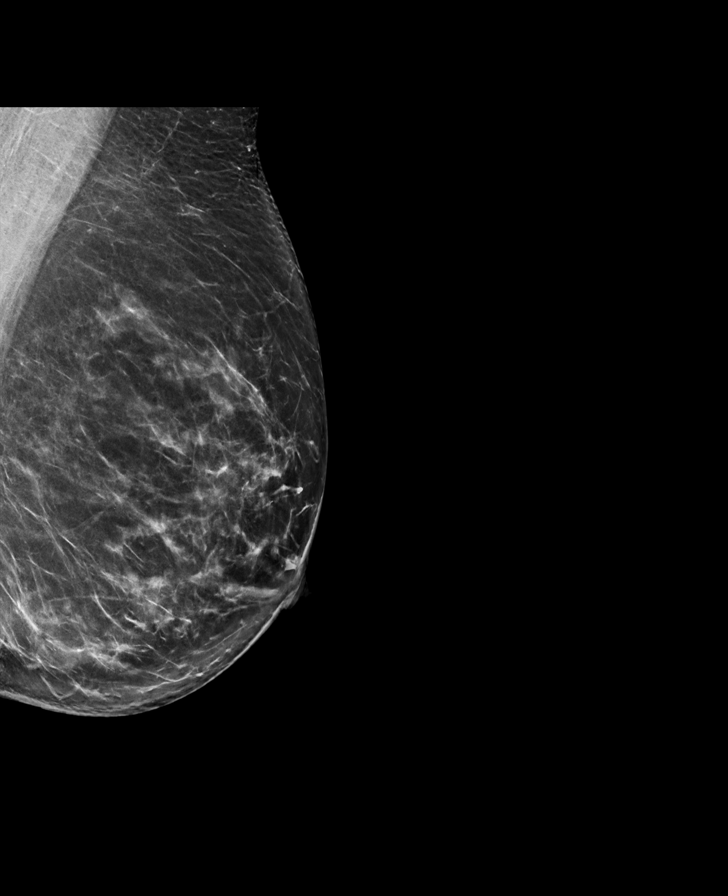

[L MLO tomo · tomo slice 44/87.0]
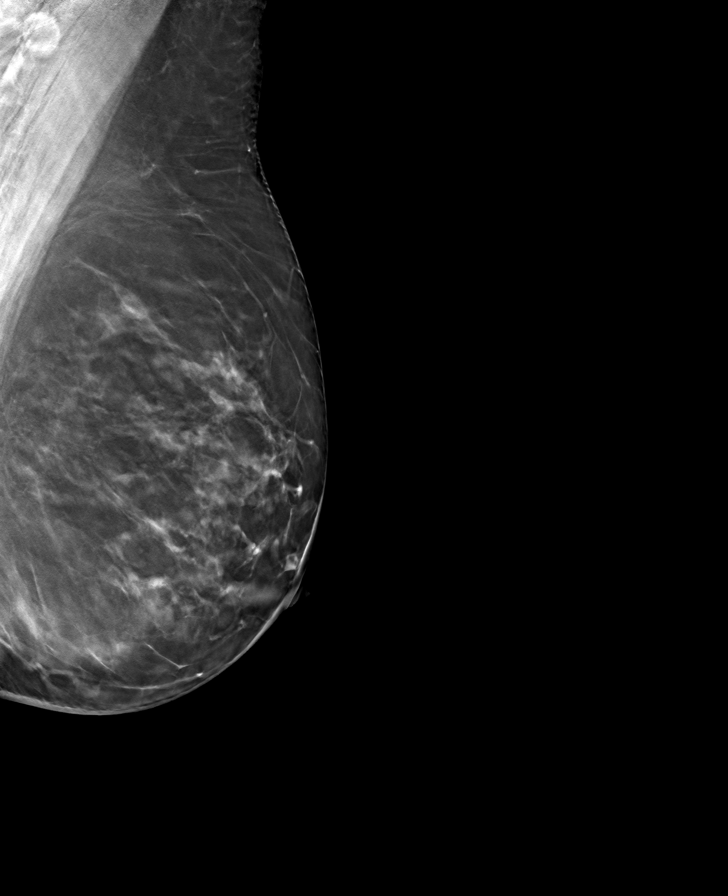

[R MLO tomo · tomo slice 43/84.0]
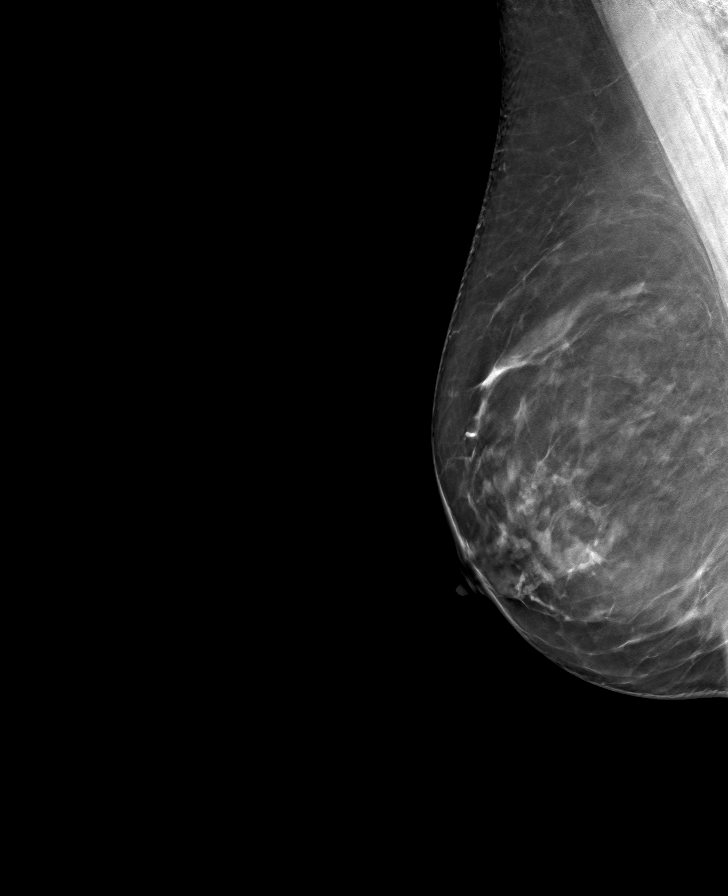

[L CC tomo · tomo slice 41/82.0]
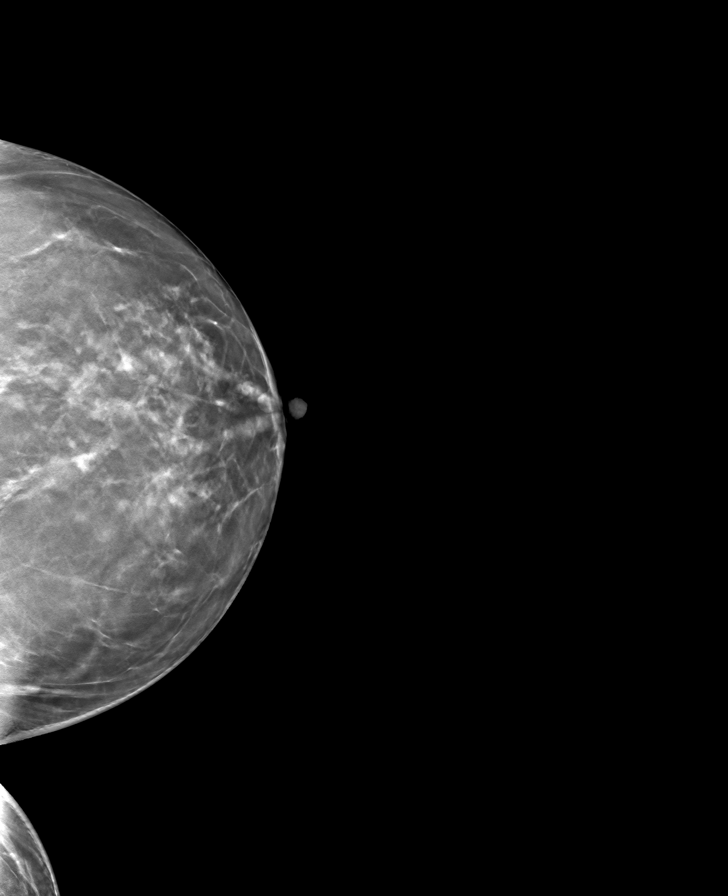

[R CC tomo · tomo slice 40/79.0]
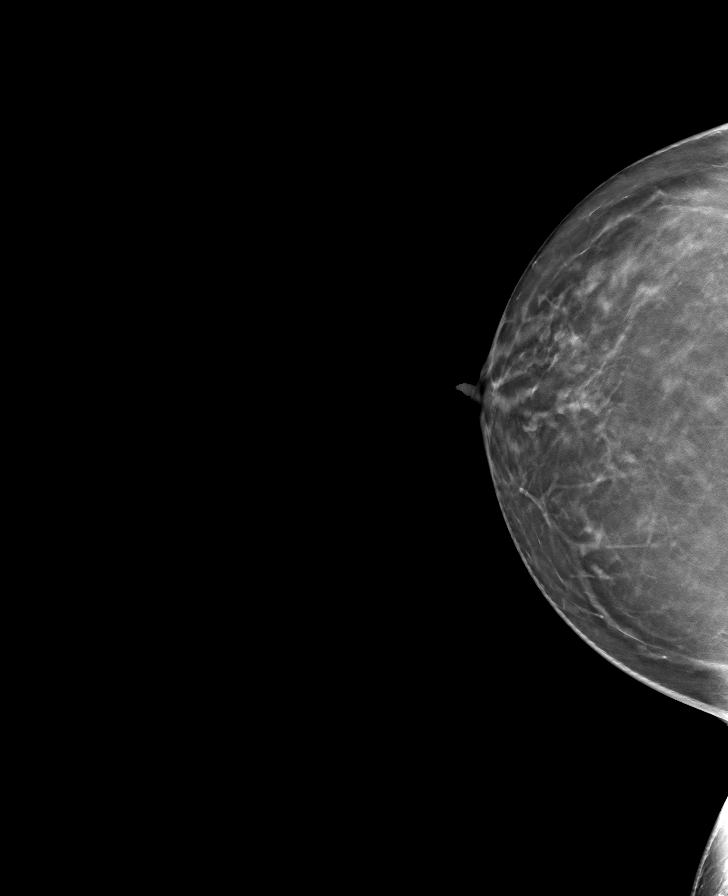

[8 of 24 positions shown; findings below may reference images not displayed]

ACR Breast Density Category b: There are scattered areas of
fibroglandular density.
FINDINGS: There are no findings suspicious for malignancy. Images were
processed with CAD.
IMPRESSION: No mammographic evidence of malignancy. A result letter of this
screening mammogram will be mailed directly to the patient.

RECOMMENDATION:
Screening mammogram in one year. (Code:[id number])

BI-RADS CATEGORY  1: Negative.

## 2020-10-30 ENCOUNTER — Other Ambulatory Visit: Payer: Self-pay | Admitting: Cardiovascular Disease

## 2020-11-02 ENCOUNTER — Telehealth: Payer: Self-pay

## 2020-11-07 DIAGNOSIS — R7309 Other abnormal glucose: Secondary | ICD-10-CM | POA: Diagnosis not present

## 2020-11-07 DIAGNOSIS — R5381 Other malaise: Secondary | ICD-10-CM | POA: Diagnosis not present

## 2020-11-07 DIAGNOSIS — N959 Unspecified menopausal and perimenopausal disorder: Secondary | ICD-10-CM | POA: Diagnosis not present

## 2020-11-07 DIAGNOSIS — K760 Fatty (change of) liver, not elsewhere classified: Secondary | ICD-10-CM | POA: Diagnosis not present

## 2020-11-07 DIAGNOSIS — R002 Palpitations: Secondary | ICD-10-CM | POA: Diagnosis not present

## 2020-11-07 DIAGNOSIS — M810 Age-related osteoporosis without current pathological fracture: Secondary | ICD-10-CM | POA: Diagnosis not present

## 2020-11-20 ENCOUNTER — Ambulatory Visit (INDEPENDENT_AMBULATORY_CARE_PROVIDER_SITE_OTHER): Payer: PPO | Admitting: Cardiovascular Disease

## 2020-11-20 ENCOUNTER — Encounter: Payer: Self-pay | Admitting: Cardiovascular Disease

## 2020-11-20 ENCOUNTER — Other Ambulatory Visit: Payer: Self-pay

## 2020-11-20 VITALS — BP 100/68 | HR 71 | Ht 66.5 in | Wt 151.8 lb

## 2020-11-20 DIAGNOSIS — M359 Systemic involvement of connective tissue, unspecified: Secondary | ICD-10-CM

## 2020-11-20 DIAGNOSIS — R002 Palpitations: Secondary | ICD-10-CM

## 2020-11-20 DIAGNOSIS — I440 Atrioventricular block, first degree: Secondary | ICD-10-CM | POA: Diagnosis not present

## 2020-11-20 NOTE — Patient Instructions (Addendum)
Medication Instructions:  Per Dr. Claiborne Billings you may take an additional 12.5mg  to 25mg  (1/2 tablet to 1 tablet) metoprolol succinate as needed for palpitations.   *If you need a refill on your cardiac medications before your next appointment, please call your pharmacy*   Lab Work: None ordered.    Testing/Procedures: None ordered.    Follow-Up: At [facility], you and your health needs are our priority.  As part of our continuing mission to provide you with exceptional heart care, we have created designated Provider Care Teams.  These Care Teams include your primary Cardiologist (physician) and Advanced Practice Providers (APPs -  Physician Assistants and Nurse Practitioners) who all work together to provide you with the care you need, when you need it.  We recommend signing up for the patient portal called "MyChart".  Sign up information is provided on this After Visit Summary.  MyChart is used to connect with patients for Virtual Visits (Telemedicine).  Patients are able to view lab/test results, encounter notes, upcoming appointments, etc.  Non-urgent messages can be sent to your provider as well.   To learn more about what you can do with MyChart, go to [email or web address].    Your next appointment:   12 month(s)  The format for your next appointment:   In Person  Provider:   Shelva Majestic, MD

## 2020-11-20 NOTE — Progress Notes (Signed)
Patient ID: Lori [name], female   DOB: [birth year], 75 y.o.   MRN: [id number]     HPI: Lori [name] is a 75 year old female who is a former patient of Dr. Rollene Fare.  I last saw her in April  2021.  She presents for 14 month follow-up evaluation.  Lori [name] has a history of episodic palpitations in the past has been demonstrated to have a PACs with short runs of atrial tachycardia. She  has a history of irritable bowel syndrome, chronic fatigue, hypothyroidism.  She has had issues with bladder infections, and underwent bladder tacking surgery in July. She sees Dr. Christena Deem of urology in [facility].  In 2012 an echo Doppler study showed mild concentric left ventricular hypertrophy with normal systolic and diastolic function. There was trivial mitral and tricuspid regurgitation. She previously has had a normal Myoview scan in 2013 she also had carotid studies which essentially were normal.   She sees Dr. Sid Falcon of [place] frequently and is on numerous vitamin and supplement medications.She does take numerous herbal type medications as well as vitamins.  She states she was told of having possible low adrenal function.  She denies any recent episodes of chest pain. There are no episodes of presyncope or syncope.  Since I last saw her 15 months ago, she has felt well from a cardiovascular standpoint.  She does note an occasional palpitation but her current dose of metoprolol 37.5 mg has controlled this most of the time.  At times she has taken an extra 12.5 mg.  She states she has had autoimmune disease with mixed connective tissue disease, autoimmune hepatitis, rheumatoid arthritis, and lupus., hemachromatosis and undergoes periodic phlebotomy by Dr. Marin Olp.  She states several family members has venous disease.  Time.  She notes occasional cramping of her legs at night.  She denies exertional claudication symptoms.  She denies varicose veins.  She denies any episodes  of chest pain.  She notes mild shortness of breath when she bends over.  There is some very mild shortness of breath with activity, which has not changed significantly.  She has been undergoing blood work by Dr. Sid Falcon dose every 3 months and does plaque X treatments.   I saw her in March 2019 and over the previous year she had started therapy with protandum which she says is a mitochondrial cellular regeneration product.  She takes this for her immunologic diseases which include lupus, RA, mixedconnective tissue disorder, autoimmune hepatitis, hemachromatosis.  She feels significant improved since taking this medication.  Her palpitations had improved with metoprolol succinate and she had self reduced her dose down to 25 mg.  Her LDL cholesterol was elevated and she could not take statin therapy.  I last saw her in April 2021 and over the 2 prior years she had done fairly well and was continuing to see  Dr. Sid Falcon every 3 months who checks laboratory each time.  Apparently there has recently been some concern for Hashimoto thyroiditis.  She has been taking metoprolol succinate at 37.5 mg.  She is also followed by Dr. Marin Olp for hemochromatosis and she is heterozygous for the [id number] mutation.  She undergoes phlebotomy as indicated to keep her serum ferritin level less than 35.  She denied chest pain.    Presently, she states she has continued to feel well.  She underwent hip replacement in December 2021 and tolerated it well from a cardiac standpoint.  She denies any recent chest pain development.  At times  she notes rare palpitations.  She has been on very low-dose dextroamphetamine which she has been taking for some time and was originally placed on this due to very poor energy.  I discussed with her that this potentially could exacerbate palpitations.  She has been taking metoprolol 25 mg daily but at times has taken an extra pill if necessary for palpitations.  She continues to be on thyroid  replacement.  Due to her immunologic diseases which by her report include include lupus, RA, mixed connective tissue disorder, autoimmune hepatitis and hemochromatosis she has continued to be on hydroxychloroquine.  She presents for evaluation.   Past Medical History:  Diagnosis Date   Autoimmune hepatitis ([place])    Cancer ([place]) 06/2017   SQUAMOS CELL REMOVED FROM FOREHEAD   Chronic fatigue    Complication of anesthesia    Partial Hysterectomy-Severe pain that radiated to esophogaus, and 12 more episode    Hemochromatosis    Hormone disorder    Hypothyroidism    IBS (irritable bowel syndrome)    Lupus ([facility])    Mixed connective tissue disease ([place])    Osteoporosis 07/2017   T score -2.7 stable from prior DEXA   Palpitations    Rheumatoid arthritis ([place])     Past Surgical History:  Procedure Laterality Date   ABDOMINAL HYSTERECTOMY     Partial; pt states has only had ovaries removed   ABDOMINAL SURGERY     ABDOMINOPLASTY   BLADDER SUSPENSION     BREAST SURGERY  1980   LUMPECTOMY X 2 FROM RIGHT BREAST   CARDIOVASCULAR STRESS TEST  08/28/2011   Normal   CAROTID DOPPLER  08/28/2011   Normal, no evidence of significant diameter reduction, dissection, or vascular abnormality   COLONOSCOPY W/ POLYPECTOMY  2018   IR IMAGING GUIDED PORT INSERTION  04/27/2020   LAPAROSCOPIC CHOLECYSTECTOMY     phlebotomies     TONSILLECTOMY     TONSILLECTOMY AND ADENOIDECTOMY     TRANSTHORACIC ECHOCARDIOGRAM  01/29/2011   EF >55%, mild concentric LVH   TUBAL LIGATION      Allergies  Allergen Reactions   Albumin (Human)     Pt stated, "I am not allergic to this and don't know what this means"; 04/27/17   Amoxicillin Nausea And Vomiting   Codeine Nausea Only   Erythromycin Nausea Only and Other (See Comments)   Iodinated Diagnostic Agents Other (See Comments)   Keflex [Cephalexin] Itching   Meperidine     Pt stated, "I don't know what this is"; 04/27/17   Metoclopramide Hcl    Other Other (See  Comments)    Beef Chicken Dairy Eggs  Pt stated, "My body can't process protein; it makes me sick"   Percocet [Oxycodone-Acetaminophen]     Made pt sick    Shellfish Allergy Other (See Comments)    Pt stated, "I am not allergic to this anymore"; 04/27/17 Pt stated, "I am not allergic to this anymore"; 04/27/17   Shellfish-Derived Products    Oxycodone Nausea And Vomiting    Pt stated, "I do not know if I am allergic to this"; 04/27/17    Current Outpatient Medications  Medication Sig Dispense Refill   amiloride-hydrochlorothiazide (MODURETIC) 5-50 MG tablet Take 0.5 tablets by mouth daily. As Needed     Ascorbic Acid (VITAMIN C) 1000 MG tablet Take 1,000 mg by mouth daily.      Cholecalciferol (VITAMIN D PO) Take 1 tablet by mouth daily.     dextroamphetamine (DEXEDRINE SPANSULE) 5  MG 24 hr capsule Take 5 mg by mouth daily.  0   doxycycline (VIBRA-TABS) 100 MG tablet Take 100 mg by mouth 2 (two) times daily. As Needed     fish oil-omega-3 fatty acids 1000 MG capsule Take 1 g by mouth daily.     fluconazole (DIFLUCAN) 200 MG tablet Take 100 mg by mouth daily as needed (yeast).     hydroxychloroquine (PLAQUENIL) 200 MG tablet Take 200 mg by mouth daily.     MELATONIN PO Take 2 mg by mouth at bedtime.      metoprolol succinate (TOPROL-XL) 25 MG 24 hr tablet TAKE 1 TABLET IN AM AND 1/2 TABLET IN THE PM. PATIENT NEED TO ATTEND UPCOMING APPOINTMENT FOR FUTURE REFILLS. (Patient taking differently: Take 25 mg by mouth daily. TAKE 1 TABLET IN AM AND 1/2 TABLET IN THE PM. PATIENT NEED TO ATTEND UPCOMING APPOINTMENT FOR FUTURE REFILLS.) 45 tablet 0   nystatin (MYCOSTATIN) 500000 units TABS tablet Take 1 tablet by mouth at bedtime.     potassium chloride SA (K-DUR,KLOR-CON) 20 MEQ tablet Take 20 mEq by mouth daily.      PRASTERONE, DHEA, PO Take 28 mg by mouth daily.      Probiotic Product (PROBIOTIC DAILY PO) Take 1 capsule by mouth daily.     progesterone (PROMETRIUM) 100 MG capsule Take 100 mg  by mouth daily.      QUERCETIN PO Take 1 capsule by mouth daily.     thyroid (ARMOUR) 15 MG tablet Take 15 mg by mouth 3 (three) times a week.     thyroid (ARMOUR) 60 MG tablet Take 60 mg by mouth daily before breakfast.     vitamin B-12 (CYANOCOBALAMIN) 1000 MCG tablet Take 1,000 mcg by mouth daily.     zinc gluconate 50 MG tablet Take 50 mg by mouth daily.     Current Facility-Administered Medications  Medication Dose Route Frequency Provider Last Rate Last Admin   triamcinolone acetonide (KENALOG) 10 MG/ML injection 10 mg  10 mg Other Once Wallene Huh, DPM        Socially History is notable in that she is married for >45 years. She has 3 children and 2 grandchildren. She completed college. She does drink occasional wine. There is no tobacco history. She does walk occasionally.   Family History  Problem Relation Age of Onset   Seizures Mother    Transient ischemic attack Mother    Glaucoma Mother    Cancer Father 68       COLON   Diabetes Father    Heart disease Father    Breast cancer Maternal Aunt    Heart disease Paternal Uncle    Breast cancer Maternal Aunt    Glaucoma Maternal Grandmother     ROS General: Negative; No fevers, chills, or night sweats; positive for chronic fatigue HEENT: Negative; No changes in vision or hearing, sinus congestion, difficulty swallowing Pulmonary: Negative; No cough, wheezing, shortness of breath, hemoptysis Cardiovascular: Negative; No chest pain, presyncope, syncope, palpitations GI: Nausea for irritable bowel syndrome No nausea, vomiting, diarrhea, or abdominal pain GU: Negative; No dysuria, hematuria, or difficulty voiding Musculoskeletal: Positive for mixed connective tissue disease Rheumatologic: Reported history of lupus and rheumatoid disease. Hematologic/Oncology: Positive for hemachromatosis for which she undergoes phlebotomy if ferritin level is greater than 35 Endocrine: Negative; no heat/cold intolerance; no  diabetes Neuro: Negative; no changes in balance, headaches Skin: Negative; No rashes or skin lesions Psychiatric: Negative; No behavioral problems, depression Sleep: Negative; No  snoring, daytime sleepiness, hypersomnolence, bruxism, restless legs, hypnogognic hallucinations, no cataplexy Other comprehensive 14 point system review is negative.   PE BP 100/68 (BP Location: Right Arm)   Pulse 71   Ht 5' 6.5" (1.689 m)   Wt 151 lb 12.8 oz (68.9 kg)   BMI 24.13 kg/m    Repeat blood pressure by me 114/70 supine and 104/70 standing  Wt Readings from Last 3 Encounters:  11/20/20 151 lb 12.8 oz (68.9 kg)  04/27/20 150 lb (68 kg)  04/06/20 156 lb 1.4 oz (70.8 kg)   General: Alert, oriented, no distress.  Skin: normal turgor, no rashes, warm and dry HEENT: Normocephalic, atraumatic. Pupils equal round and reactive to light; sclera anicteric; extraocular muscles intact;  Nose without nasal septal hypertrophy Mouth/Parynx benign; Mallinpatti scale 2 Neck: No JVD, no carotid bruits; normal carotid upstroke Lungs: clear to ausculatation and percussion; no wheezing or rales Chest wall: without tenderness to palpitation Heart: PMI not displaced, RR with an occasional ectopic complexes on prolonged auscultation, s1 s2 normal, 1/6 systolic murmur, no diastolic murmur, no rubs, gallops, thrills, or heaves Abdomen: soft, nontender; no hepatosplenomehaly, BS+; abdominal aorta nontender and not dilated by palpation. Back: no CVA tenderness Pulses 2+ Musculoskeletal: full range of motion, normal strength, no joint deformities Extremities: no clubbing cyanosis or edema, Homan's sign negative  Neurologic: grossly nonfocal; Cranial nerves grossly wnl Psychologic: Normal mood and affect  ECG (independently read by me): Sinus rhythm at 73; 1st degree AV block; PR 230 msec;  April 2021 ECG (independently read by me): Normal sinus rhythm at 82 bpm.  First-degree AV block with a PR interval at 212 ms.  No  ectopy.  March 2019 ECG (independently read by me): Normal sinus rhythm at 88 bpm.  Borderline first-degree AV block with a PR interval of 204 ms.  Normal intervals.  No ST segment changes.  February 2018 ECG (independently read by me): Normal sinus rhythm with PACs and mild sinus arrhythmia.  QTc interval 442 ms.  November 2016 ECG (independently read by me): ECG reveals sinus rhythm with first-degree AV block with PR interval 216 only seconds. Occasional PACs.  Nonspecific ST changes.  November 2015 ECG (independently read by me): Sinus rhythm with sinus arrhythmia and PACs and transient atrial bigeminal like pattern  Prior November 2014  ECG: Sinus rhythm with PAC. QTc interval 442 ms. PR interval 200 ms  LABS: BMP Latest Ref Rng & Units 09/20/2020 08/03/2020 04/06/2020  Glucose 70 - 99 mg/dL 95 86 99  BUN 8 - 23 mg/dL 10 15 10   Creatinine 0.44 - 1.00 mg/dL 0.70 0.74 0.54  Sodium 135 - 145 mmol/L 137 131(L) 131(L)  Potassium 3.5 - 5.1 mmol/L 3.9 3.7 3.5  Chloride 98 - 111 mmol/L 104 96(L) 95(L)  CO2 22 - 32 mmol/L 27 27 25   Calcium 8.9 - 10.3 mg/dL 9.7 10.2 9.5   Hepatic Function Latest Ref Rng & Units 09/20/2020 08/03/2020 03/22/2020  Total Protein 6.5 - 8.1 g/dL 6.4(L) 6.9 7.5  Albumin 3.5 - 5.0 g/dL 4.1 4.6 4.7  AST 15 - 41 U/L 24 26 27   ALT 0 - 44 U/L 23 20 28   Alk Phosphatase 38 - 126 U/L 59 86 76  Total Bilirubin 0.3 - 1.2 mg/dL 0.6 0.6 0.4  Bilirubin, Direct 0.0 - 0.3 mg/dL - - -   CBC Latest Ref Rng & Units 09/20/2020 08/03/2020 04/27/2020  WBC 4.0 - 10.5 K/uL 7.0 6.7 5.5  Hemoglobin 12.0 - 15.0 g/dL 13.7  14.1 15.8(H)  Hematocrit 36.0 - 46.0 % 40.1 40.8 47.4(H)  Platelets 150 - 400 K/uL 216 236 222   Lab Results  Component Value Date   MCV 93.3 09/20/2020   MCV 91.7 08/03/2020   MCV 98.5 04/27/2020   No results found for: TSH   No results found for: HGBA1C   Lipid Panel     Component Value Date/Time   CHOL (H) 08/18/2008 0250    209        ATP III  CLASSIFICATION:  <200     mg/dL   Desirable  200-239  mg/dL   Borderline High  >=240    mg/dL   High          TRIG 87 08/18/2008 0250   HDL 58 08/18/2008 0250   CHOLHDL 3.6 08/18/2008 0250   VLDL 17 08/18/2008 0250   LDLCALC (H) 08/18/2008 0250    134        Total Cholesterol/HDL:CHD Risk Coronary Heart Disease Risk Table                     Men   Women  1/2 Average Risk   3.4   3.3  Average Risk       5.0   4.4  2 X Average Risk   9.6   7.1  3 X Average Risk  23.4   11.0        Use the calculated Patient Ratio above and the CHD Risk Table to determine the patient's CHD Risk.        ATP III CLASSIFICATION (LDL):  <100     mg/dL   Optimal  100-129  mg/dL   Near or Above                    Optimal  130-159  mg/dL   Borderline  160-189  mg/dL   High  >190     mg/dL   Very High     RADIOLOGY: Dg Foot Complete Left  04/17/2013   Multiple view x-rays of left foot indicate no signs of stress fracture  with what appears to be arthritis at the fifth MPJ with narrowing of the  joint surface noted x-ray  Dg Foot Complete Right  04/17/2013   Multiple view x-rays of right foot indicate good alignment with no signs  of stress fracture or arthritis noted  IMPRESSION:  1. Palpitations   2. Primary hemochromatosis ([place])   3. Autoimmune disease ([place])   4. First degree AV block     ASSESSMENT AND PLAN: Lori [name] is a 75 year old female who is on multiple vitamins and additional supplemental medications prescribed by Dr. Gerarda Gunther at [facility].  She has a history of palpitations and has been taking Toprol-XL.  In the past, palpitations were successfully treated with titration of metoprolol succinate to 37.5 mg daily but subsequently, she had self reduced her dose down to 25 mg.  Presently, she does note occasional palpitations and on auscultation today she did have occasional ectopic complexes which were isolated.  Her blood pressure today is low with mild  drop of 10 mm going from supine to standing.  I again have suggested she try increasing metoprolol to 37.5 mg daily which should be beneficial.  I discussed the fact that she is taking dextroamphetamine 5 mg and this may be contributing to her palpitations.  In the past when she tried to discontinue this she became very listless and feels  she needs to take this to keep her energy level adequate.  She continues to be on thyroid replacement.  She is followed by Dr. Marin Olp for her hereditary hemochromatosis with a goal to keep her ferritin level less than 35.  She is not having any anginal type symptoms.  In the past she had developed some atypical chest pain.  She has rheumatologic issues for which she takes hydroxychloroquine.  She underwent successful hip replacement without cardiovascular compromise.  She continues to see Dr. August the dose every 3 months and he checks laboratory every 3 months.  I will see her in 1 year  for follow-up evaluation.   Troy Sine, MD, [facility] 11/21/2020 7:29 PM

## 2020-12-05 ENCOUNTER — Telehealth: Payer: Self-pay | Admitting: *Deleted

## 2020-12-05 ENCOUNTER — Other Ambulatory Visit: Payer: Self-pay | Admitting: Obstetrics & Gynecology

## 2020-12-05 DIAGNOSIS — Z1231 Encounter for screening mammogram for malignant neoplasm of breast: Secondary | ICD-10-CM

## 2020-12-05 NOTE — Telephone Encounter (Signed)
Patient called asking how frequent she should have mammograms at her age, I explained to patient per the radiologist on recent mammogram. 1 year follow is recommended.

## 2020-12-06 ENCOUNTER — Other Ambulatory Visit: Payer: PPO

## 2020-12-06 ENCOUNTER — Ambulatory Visit: Payer: PPO | Admitting: Hematology & Oncology

## 2020-12-17 ENCOUNTER — Inpatient Hospital Stay: Payer: PPO | Attending: Hematology & Oncology

## 2020-12-17 ENCOUNTER — Encounter: Payer: Self-pay | Admitting: Hematology & Oncology

## 2020-12-17 ENCOUNTER — Other Ambulatory Visit: Payer: Self-pay

## 2020-12-17 ENCOUNTER — Inpatient Hospital Stay (HOSPITAL_BASED_OUTPATIENT_CLINIC_OR_DEPARTMENT_OTHER): Payer: PPO | Admitting: Hematology & Oncology

## 2020-12-17 ENCOUNTER — Inpatient Hospital Stay: Payer: PPO

## 2020-12-17 DIAGNOSIS — M810 Age-related osteoporosis without current pathological fracture: Secondary | ICD-10-CM

## 2020-12-17 DIAGNOSIS — Z452 Encounter for adjustment and management of vascular access device: Secondary | ICD-10-CM

## 2020-12-17 LAB — CMP (CANCER CENTER ONLY)
ALT: 25 U/L (ref 0–44)
AST: 30 U/L (ref 15–41)
Albumin: 4.2 g/dL (ref 3.5–5.0)
Alkaline Phosphatase: 70 U/L (ref 38–126)
Anion gap: 7 (ref 5–15)
BUN: 15 mg/dL (ref 8–23)
CO2: 26 mmol/L (ref 22–32)
Calcium: 10 mg/dL (ref 8.9–10.3)
Chloride: 105 mmol/L (ref 98–111)
Creatinine: 0.71 mg/dL (ref 0.44–1.00)
GFR, Estimated: >60 mL/min (ref >60)
Glucose, Bld: 106 mg/dL — ABNORMAL HIGH (ref 70–99)
Potassium: 3.6 mmol/L (ref 3.5–5.1)
Sodium: 138 mmol/L (ref 135–145)
Total Bilirubin: 0.5 mg/dL (ref 0.3–1.2)
Total Protein: 6.5 g/dL (ref 6.5–8.1)

## 2020-12-17 LAB — CBC WITH DIFFERENTIAL (CANCER CENTER ONLY)
Abs Immature Granulocytes: 0.03 10*3/uL (ref 0.00–0.07)
Basophils Absolute: 0 10*3/uL (ref 0.0–0.1)
Basophils Relative: 1 %
Eosinophils Absolute: 0.1 10*3/uL (ref 0.0–0.5)
Eosinophils Relative: 1 %
HCT: 41 % (ref 36.0–46.0)
Hemoglobin: 13.7 g/dL (ref 12.0–15.0)
Immature Granulocytes: 1 %
Lymphocytes Relative: 33 %
Lymphs Abs: 2.2 10*3/uL (ref 0.7–4.0)
MCH: 32.4 pg (ref 26.0–34.0)
MCHC: 33.4 g/dL (ref 30.0–36.0)
MCV: 96.9 fL (ref 80.0–100.0)
Monocytes Absolute: 0.5 10*3/uL (ref 0.1–1.0)
Monocytes Relative: 8 %
Neutro Abs: 3.8 10*3/uL (ref 1.7–7.7)
Neutrophils Relative %: 56 %
Platelet Count: 182 10*3/uL (ref 150–400)
RBC: 4.23 MIL/uL (ref 3.87–5.11)
RDW: 12.2 % (ref 11.5–15.5)
WBC Count: 6.6 10*3/uL (ref 4.0–10.5)
nRBC: 0 % (ref 0.0–0.2)

## 2020-12-17 MED ORDER — HEPARIN SOD (PORK) LOCK FLUSH 100 UNIT/ML IV SOLN
500.0000 [IU] | Freq: Once | INTRAVENOUS | Status: AC | PRN
  Administered 2020-12-17: 500 [IU]
  Filled 2020-12-17: qty 5

## 2020-12-17 MED ORDER — SODIUM CHLORIDE 0.9% FLUSH
10.0000 mL | INTRAVENOUS | Status: DC | PRN
  Administered 2020-12-17: 10 mL
  Filled 2020-12-17: qty 10

## 2020-12-17 NOTE — Progress Notes (Signed)
Hematology and Oncology Follow Up Visit  Lori [name] [id number] 05/26/1946 75 y.o. 12/17/2020   Principle Diagnosis:  Hemochromatosis - heterozygous for the C282Y mutation   Current Therapy:        Phlebotomy as indicated to keep ferritin < 35  (changed per patient request 06/24/17)   Interim History:  Lori [name] is here today for follow-up.  We last saw her back in October last year.  Since then, she has had surgery for her left hip.   She is taking quite a bit of supplements to help prevent her from getting COVID.  I think she has been on hydroxychloroquine and ivermectin.  Back in April, her iron saturation was 81%.  Her ferritin was only 48.  She has some small bruising.  She is had no obvious bleeding.  Her appetite seems to be doing okay.  She has had no nausea or vomiting.  There is been no issues with diarrhea.  She did have a colonoscopy in May.  This showed melanosis coli.  She had some small polyps.  Overall, I would have to say her performance status is probably ECOG 1.   Medications:  Allergies as of 12/17/2020       Reactions   Albumin (human)    Pt stated, "I am not allergic to this and don't know what this means"; 04/27/17   Amoxicillin Nausea And Vomiting   Codeine Nausea Only   Erythromycin Nausea Only, Other (See Comments)   Iodinated Diagnostic Agents Other (See Comments)   Keflex [cephalexin] Itching   Meperidine    Pt stated, "I don't know what this is"; 04/27/17   Metoclopramide Hcl    Other Other (See Comments)   Beef Chicken Dairy Eggs Pt stated, "My body can't process protein; it makes me sick"   Percocet [oxycodone-acetaminophen]    Made pt sick    Shellfish Allergy Other (See Comments)   Pt stated, "I am not allergic to this anymore"; 04/27/17 Pt stated, "I am not allergic to this anymore"; 04/27/17   Shellfish-derived Products    Oxycodone Nausea And Vomiting   Pt stated, "I do not know if I am allergic to this"; 04/27/17         Medication List        Accurate as of December 17, 2020  3:19 PM. If you have any questions, ask your nurse or doctor.          amiloride-hydrochlorothiazide 5-50 MG tablet Commonly known as: MODURETIC Take 0.5 tablets by mouth daily. As Needed   dextroamphetamine 5 MG 24 hr capsule Commonly known as: DEXEDRINE SPANSULE Take 5 mg by mouth daily.   doxycycline 100 MG tablet Commonly known as: VIBRA-TABS Take 100 mg by mouth 2 (two) times daily. As Needed   fish oil-omega-3 fatty acids 1000 MG capsule Take 1 g by mouth daily.   fluconazole 200 MG tablet Commonly known as: DIFLUCAN Take 100 mg by mouth daily as needed (yeast).   hydroxychloroquine 200 MG tablet Commonly known as: PLAQUENIL Take 200 mg by mouth daily.   MELATONIN PO Take 2 mg by mouth at bedtime.   metoprolol succinate 25 MG 24 hr tablet Commonly known as: TOPROL-XL TAKE 1 TABLET IN AM AND 1/2 TABLET IN THE PM. PATIENT NEED TO ATTEND UPCOMING APPOINTMENT FOR FUTURE REFILLS. What changed:  how much to take how to take this when to take this   nystatin 500000 units Tabs tablet Commonly known as: MYCOSTATIN Take 1 tablet by  mouth at bedtime.   potassium chloride SA 20 MEQ tablet Commonly known as: KLOR-CON Take 20 mEq by mouth daily.   PRASTERONE (DHEA) PO Take 28 mg by mouth daily.   PROBIOTIC DAILY PO Take 1 capsule by mouth daily.   progesterone 100 MG capsule Commonly known as: PROMETRIUM Take 100 mg by mouth daily.   QUERCETIN PO Take 1 capsule by mouth daily.   thyroid 60 MG tablet Commonly known as: ARMOUR Take 60 mg by mouth daily before breakfast.   thyroid 15 MG tablet Commonly known as: ARMOUR Take 15 mg by mouth 3 (three) times a week.   vitamin B-12 1000 MCG tablet Commonly known as: CYANOCOBALAMIN Take 1,000 mcg by mouth daily.   vitamin C 1000 MG tablet Take 1,000 mg by mouth daily.   VITAMIN D PO Take 1 tablet by mouth daily.   zinc gluconate 50 MG  tablet Take 50 mg by mouth daily.        Allergies:  Allergies  Allergen Reactions   Albumin (Human)     Pt stated, "I am not allergic to this and don't know what this means"; 04/27/17   Amoxicillin Nausea And Vomiting   Codeine Nausea Only   Erythromycin Nausea Only and Other (See Comments)   Iodinated Diagnostic Agents Other (See Comments)   Keflex [Cephalexin] Itching   Meperidine     Pt stated, "I don't know what this is"; 04/27/17   Metoclopramide Hcl    Other Other (See Comments)    Beef Chicken Dairy Eggs  Pt stated, "My body can't process protein; it makes me sick"   Percocet [Oxycodone-Acetaminophen]     Made pt sick    Shellfish Allergy Other (See Comments)    Pt stated, "I am not allergic to this anymore"; 04/27/17 Pt stated, "I am not allergic to this anymore"; 04/27/17   Shellfish-Derived Products    Oxycodone Nausea And Vomiting    Pt stated, "I do not know if I am allergic to this"; 04/27/17    Past Medical History, Surgical history, Social history, and Family History were reviewed and updated.  Review of Systems: All other 10 point review of systems is negative.   Physical Exam:  weight is 151 lb 1.9 oz (68.5 kg). Her oral temperature is 99.3 F (37.4 C). Her blood pressure is 97/57 (abnormal) and her pulse is 55 (abnormal). Her respiration is 17 and oxygen saturation is 100%.   Wt Readings from Last 3 Encounters:  12/17/20 151 lb 1.9 oz (68.5 kg)  11/20/20 151 lb 12.8 oz (68.9 kg)  04/27/20 150 lb (68 kg)    Ocular: Sclerae unicteric, pupils equal, round and reactive to light Ear-nose-throat: Oropharynx clear, dentition fair Lymphatic: No cervical or supraclavicular adenopathy Lungs no rales or rhonchi, good excursion bilaterally Heart regular rate and rhythm, no murmur appreciated Abd soft, nontender, positive bowel sounds MSK no focal spinal tenderness, no joint edema Neuro: non-focal, well-oriented, appropriate affect Breasts: Deferred     Lab Results  Component Value Date   WBC 6.6 12/17/2020   HGB 13.7 12/17/2020   HCT 41.0 12/17/2020   MCV 96.9 12/17/2020   PLT 182 12/17/2020   Lab Results  Component Value Date   FERRITIN 48 09/20/2020   IRON 191 (H) 09/20/2020   TIBC 236 09/20/2020   UIBC 45 (L) 09/20/2020   IRONPCTSAT 81 (H) 09/20/2020   Lab Results  Component Value Date   RBC 4.23 12/17/2020   No results found for: KPAFRELGTCHN,  LAMBDASER, KAPLAMBRATIO No results found for: IGGSERUM, IGA, IGMSERUM No results found for: Odetta Pink, SPEI   Chemistry      Component Value Date/Time   NA 138 12/17/2020 1411   NA 143 03/11/2017 1404   NA 139 07/02/2016 1139   K 3.6 12/17/2020 1411   K 3.9 03/11/2017 1404   K 4.1 07/02/2016 1139   CL 105 12/17/2020 1411   CL 105 03/11/2017 1404   CO2 26 12/17/2020 1411   CO2 31 03/11/2017 1404   CO2 26 07/02/2016 1139   BUN 15 12/17/2020 1411   BUN 15 03/11/2017 1404   BUN 20.3 07/02/2016 1139   CREATININE 0.71 12/17/2020 1411   CREATININE 0.7 03/11/2017 1404   CREATININE 0.8 07/02/2016 1139      Component Value Date/Time   CALCIUM 10.0 12/17/2020 1411   CALCIUM 9.4 03/11/2017 1404   CALCIUM 10.0 07/02/2016 1139   ALKPHOS 70 12/17/2020 1411   ALKPHOS 69 03/11/2017 1404   ALKPHOS 87 07/02/2016 1139   AST 30 12/17/2020 1411   AST 27 07/02/2016 1139   ALT 25 12/17/2020 1411   ALT 32 03/11/2017 1404   ALT 34 07/02/2016 1139   BILITOT 0.5 12/17/2020 1411   BILITOT 0.55 07/02/2016 1139       Impression and Plan: Lori [name] is a pleasant 75 yo caucasian female with hemochromatosis, heterozygous for the C282Y mutations.   We will see what her iron studies show.  Hopefully, she will not need to be phlebotomized.  We will still check her labs every 3 months.  I think this would be reasonable.  We typically see her back yearly.  Volanda Napoleon, MD 7/25/20223:19 PM

## 2020-12-18 ENCOUNTER — Telehealth: Payer: Self-pay

## 2020-12-18 LAB — IRON AND TIBC
Iron: 212 ug/dL — ABNORMAL HIGH (ref 41–142)
Saturation Ratios: 92 % — ABNORMAL HIGH (ref 21–57)
TIBC: 231 ug/dL — ABNORMAL LOW (ref 236–444)
UIBC: 19 ug/dL — ABNORMAL LOW (ref 120–384)

## 2020-12-18 LAB — FERRITIN: Ferritin: 37 ng/mL (ref 11–307)

## 2020-12-21 ENCOUNTER — Other Ambulatory Visit: Payer: Self-pay

## 2020-12-21 ENCOUNTER — Inpatient Hospital Stay: Payer: PPO

## 2020-12-21 VITALS — BP 103/55 | HR 71 | Temp 98.0°F | Resp 17

## 2020-12-21 DIAGNOSIS — M810 Age-related osteoporosis without current pathological fracture: Secondary | ICD-10-CM

## 2020-12-21 DIAGNOSIS — Z452 Encounter for adjustment and management of vascular access device: Secondary | ICD-10-CM

## 2020-12-21 MED ORDER — HEPARIN SOD (PORK) LOCK FLUSH 100 UNIT/ML IV SOLN
500.0000 [IU] | Freq: Once | INTRAVENOUS | Status: AC | PRN
  Administered 2020-12-21: 500 [IU]
  Filled 2020-12-21: qty 5

## 2020-12-21 MED ORDER — SODIUM CHLORIDE 0.9% FLUSH
10.0000 mL | INTRAVENOUS | Status: DC | PRN
  Administered 2020-12-21: 10 mL
  Filled 2020-12-21: qty 10

## 2020-12-21 NOTE — Progress Notes (Signed)
Lori [name] presents today for phlebotomy per MD orders. Phlebotomy procedure started at 1410 and ended at 1440 510 grams removed via 20G port-a-cath.  Patient declined observed for 30 minutes after procedure without any incident. Stayed for 10 minutes and stated she felt fine and BP was stable.  Patient tolerated procedure well and received replacement fluids after procedure.  Patient understands to call if he has any questions or concerns post discharge.

## 2021-01-21 DIAGNOSIS — L817 Pigmented purpuric dermatosis: Secondary | ICD-10-CM | POA: Diagnosis not present

## 2021-01-21 DIAGNOSIS — Z85828 Personal history of other malignant neoplasm of skin: Secondary | ICD-10-CM | POA: Diagnosis not present

## 2021-01-21 DIAGNOSIS — L570 Actinic keratosis: Secondary | ICD-10-CM | POA: Diagnosis not present

## 2021-01-29 ENCOUNTER — Ambulatory Visit: Payer: PPO

## 2021-01-31 ENCOUNTER — Inpatient Hospital Stay: Payer: PPO

## 2021-01-31 ENCOUNTER — Inpatient Hospital Stay: Payer: PPO | Attending: Hematology & Oncology

## 2021-01-31 ENCOUNTER — Other Ambulatory Visit: Payer: Self-pay

## 2021-01-31 LAB — CBC WITH DIFFERENTIAL (CANCER CENTER ONLY)
Abs Immature Granulocytes: 0.02 10*3/uL (ref 0.00–0.07)
Basophils Absolute: 0 10*3/uL (ref 0.0–0.1)
Basophils Relative: 0 %
Eosinophils Absolute: 0.1 10*3/uL (ref 0.0–0.5)
Eosinophils Relative: 1 %
HCT: 39.9 % (ref 36.0–46.0)
Hemoglobin: 13.6 g/dL (ref 12.0–15.0)
Immature Granulocytes: 0 %
Lymphocytes Relative: 27 %
Lymphs Abs: 2.6 10*3/uL (ref 0.7–4.0)
MCH: 32.5 pg (ref 26.0–34.0)
MCHC: 34.1 g/dL (ref 30.0–36.0)
MCV: 95.2 fL (ref 80.0–100.0)
Monocytes Absolute: 0.7 10*3/uL (ref 0.1–1.0)
Monocytes Relative: 7 %
Neutro Abs: 6.2 10*3/uL (ref 1.7–7.7)
Neutrophils Relative %: 65 %
Platelet Count: 219 10*3/uL (ref 150–400)
RBC: 4.19 MIL/uL (ref 3.87–5.11)
RDW: 12.1 % (ref 11.5–15.5)
WBC Count: 9.6 10*3/uL (ref 4.0–10.5)
nRBC: 0 % (ref 0.0–0.2)

## 2021-01-31 LAB — CMP (CANCER CENTER ONLY)
ALT: 27 U/L (ref 0–44)
AST: 34 U/L (ref 15–41)
Albumin: 4.3 g/dL (ref 3.5–5.0)
Alkaline Phosphatase: 75 U/L (ref 38–126)
Anion gap: 8 (ref 5–15)
BUN: 11 mg/dL (ref 8–23)
CO2: 24 mmol/L (ref 22–32)
Calcium: 9.4 mg/dL (ref 8.9–10.3)
Chloride: 101 mmol/L (ref 98–111)
Creatinine: 0.7 mg/dL (ref 0.44–1.00)
GFR, Estimated: >60 mL/min (ref >60)
Glucose, Bld: 90 mg/dL (ref 70–99)
Potassium: 3.6 mmol/L (ref 3.5–5.1)
Sodium: 133 mmol/L — ABNORMAL LOW (ref 135–145)
Total Bilirubin: 0.8 mg/dL (ref 0.3–1.2)
Total Protein: 6.9 g/dL (ref 6.5–8.1)

## 2021-01-31 LAB — RETICULOCYTES
Immature Retic Fract: 5.3 % (ref 2.3–15.9)
RBC.: 4.14 MIL/uL (ref 3.87–5.11)
Retic Count, Absolute: 69.6 10*3/uL (ref 19.0–186.0)
Retic Ct Pct: 1.7 % (ref 0.4–3.1)

## 2021-01-31 MED ORDER — SODIUM CHLORIDE 0.9% FLUSH
10.0000 mL | INTRAVENOUS | Status: DC | PRN
  Administered 2021-01-31: 10 mL via INTRAVENOUS

## 2021-01-31 MED ORDER — HEPARIN SOD (PORK) LOCK FLUSH 100 UNIT/ML IV SOLN
500.0000 [IU] | Freq: Once | INTRAVENOUS | Status: AC
  Administered 2021-01-31: 500 [IU] via INTRAVENOUS

## 2021-01-31 NOTE — Patient Instructions (Signed)
Implanted Port Home Guide An implanted port is a device that is placed under the skin. It is usually placed in the chest. The device can be used to give IV medicine, to take blood, or for dialysis. You may have an implanted port if: You need IV medicine that would be irritating to the small veins in your hands or arms. You need IV medicines, such as antibiotics, for a long period of time. You need IV nutrition for a long period of time. You need dialysis. When you have a port, your health care provider can choose to use the port instead of veins in your arms for these procedures. You may have fewer limitations when using a port than you would if you used other types of long-term IVs, and you will likely be able to return to normal activities after your incision heals. An implanted port has two main parts: Reservoir. The reservoir is the part where a needle is inserted to give medicines or draw blood. The reservoir is round. After it is placed, it appears as a small, raised area under your skin. Catheter. The catheter is a thin, flexible tube that connects the reservoir to a vein. Medicine that is inserted into the reservoir goes into the catheter and then into the vein. How is my port accessed? To access your port: A numbing cream may be placed on the skin over the port site. Your health care provider will put on a mask and sterile gloves. The skin over your port will be cleaned carefully with a germ-killing soap and allowed to dry. Your health care provider will gently pinch the port and insert a needle into it. Your health care provider will check for a blood return to make sure the port is in the vein and is not clogged. If your port needs to remain accessed to get medicine continuously (constant infusion), your health care provider will place a clear bandage (dressing) over the needle site. The dressing and needle will need to be changed every week, or as told by your health care provider. What  is flushing? Flushing helps keep the port from getting clogged. Follow instructions from your health care provider about how and when to flush the port. Ports are usually flushed with saline solution or a medicine called heparin. The need for flushing will depend on how the port is used: If the port is only used from time to time to give medicines or draw blood, the port may need to be flushed: Before and after medicines have been given. Before and after blood has been drawn. As part of routine maintenance. Flushing may be recommended every 4-6 weeks. If a constant infusion is running, the port may not need to be flushed. Throw away any syringes in a disposal container that is meant for sharp items (sharps container). You can buy a sharps container from a pharmacy, or you can make one by using an empty hard plastic bottle with a cover. How long will my port stay implanted? The port can stay in for as long as your health care provider thinks it is needed. When it is time for the port to come out, a surgery will be done to remove it. The surgery will be similar to the procedure that was done to put the port in. Follow these instructions at home:  Flush your port as told by your health care provider. If you need an infusion over several days, follow instructions from your health care provider about how   to take care of your port site. Make sure you: Wash your hands with soap and water before you change your dressing. If soap and water are not available, use alcohol-based hand sanitizer. Change your dressing as told by your health care provider. Place any used dressings or infusion bags into a plastic bag. Throw that bag in the trash. Keep the dressing that covers the needle clean and dry. Do not get it wet. Do not use scissors or sharp objects near the tube. Keep the tube clamped, unless it is being used. Check your port site every day for signs of infection. Check for: Redness, swelling, or  pain. Fluid or blood. Pus or a bad smell. Protect the skin around the port site. Avoid wearing bra straps that rub or irritate the site. Protect the skin around your port from seat belts. Place a soft pad over your chest if needed. Bathe or shower as told by your health care provider. The site may get wet as long as you are not actively receiving an infusion. Return to your normal activities as told by your health care provider. Ask your health care provider what activities are safe for you. Carry a medical alert card or wear a medical alert bracelet at all times. This will let health care providers know that you have an implanted port in case of an emergency. Get help right away if: You have redness, swelling, or pain at the port site. You have fluid or blood coming from your port site. You have pus or a bad smell coming from the port site. You have a fever. Summary Implanted ports are usually placed in the chest for long-term IV access. Follow instructions from your health care provider about flushing the port and changing bandages (dressings). Take care of the area around your port by avoiding clothing that puts pressure on the area, and by watching for signs of infection. Protect the skin around your port from seat belts. Place a soft pad over your chest if needed. Get help right away if you have a fever or you have redness, swelling, pain, drainage, or a bad smell at the port site. This information is not intended to replace advice given to you by your health care provider. Make sure you discuss any questions you have with your health care provider. Document Revised: 08/01/2020 Document Reviewed: 09/26/2019 Elsevier Patient Education  2022 [employer]  

## 2021-02-01 ENCOUNTER — Other Ambulatory Visit: Payer: PPO

## 2021-02-01 LAB — IRON AND TIBC
Iron: 185 ug/dL — ABNORMAL HIGH (ref 41–142)
Saturation Ratios: 67 % — ABNORMAL HIGH (ref 21–57)
TIBC: 276 ug/dL (ref 236–444)
UIBC: 91 ug/dL — ABNORMAL LOW (ref 120–384)

## 2021-02-01 LAB — FERRITIN: Ferritin: 19 ng/mL (ref 11–307)

## 2021-02-22 ENCOUNTER — Telehealth: Payer: Self-pay | Admitting: *Deleted

## 2021-02-22 DIAGNOSIS — M810 Age-related osteoporosis without current pathological fracture: Secondary | ICD-10-CM | POA: Diagnosis not present

## 2021-02-22 DIAGNOSIS — E785 Hyperlipidemia, unspecified: Secondary | ICD-10-CM | POA: Diagnosis not present

## 2021-02-22 DIAGNOSIS — E039 Hypothyroidism, unspecified: Secondary | ICD-10-CM | POA: Diagnosis not present

## 2021-02-22 NOTE — Telephone Encounter (Signed)
Call received from patient stating that she would like a referral placed to get her port moved to another location.  Per IR, pt needs a referral for an IR consult.  IR consult ordered per Dr. Marin Olp.

## 2021-02-28 ENCOUNTER — Ambulatory Visit: Payer: PPO

## 2021-03-05 ENCOUNTER — Ambulatory Visit: Payer: PPO | Admitting: Obstetrics & Gynecology

## 2021-03-05 DIAGNOSIS — D509 Iron deficiency anemia, unspecified: Secondary | ICD-10-CM | POA: Diagnosis not present

## 2021-03-08 ENCOUNTER — Telehealth: Payer: Self-pay | Admitting: *Deleted

## 2021-03-08 NOTE — Telephone Encounter (Signed)
Call received from patient stating that she had labs drawn with Dr. Kellie Simmering at [place] who recommends that pt have phlebotomy.  Pt.'s Ferritin-24 and Iron sat-61.  Lab results reviewed by Dr. Marin Olp and okay for phlebotomy per order of Dr. Marin Olp.  Message sent to scheduling.

## 2021-03-12 ENCOUNTER — Other Ambulatory Visit: Payer: Self-pay

## 2021-03-12 ENCOUNTER — Other Ambulatory Visit: Payer: Self-pay | Admitting: *Deleted

## 2021-03-12 DIAGNOSIS — L989 Disorder of the skin and subcutaneous tissue, unspecified: Secondary | ICD-10-CM | POA: Diagnosis not present

## 2021-03-12 DIAGNOSIS — Z95828 Presence of other vascular implants and grafts: Secondary | ICD-10-CM

## 2021-03-14 ENCOUNTER — Other Ambulatory Visit: Payer: Self-pay

## 2021-03-14 ENCOUNTER — Inpatient Hospital Stay: Payer: PPO | Attending: Hematology & Oncology

## 2021-03-14 DIAGNOSIS — Z85828 Personal history of other malignant neoplasm of skin: Secondary | ICD-10-CM | POA: Diagnosis not present

## 2021-03-14 DIAGNOSIS — Z95828 Presence of other vascular implants and grafts: Secondary | ICD-10-CM

## 2021-03-14 DIAGNOSIS — L0103 Bullous impetigo: Secondary | ICD-10-CM | POA: Diagnosis not present

## 2021-03-14 MED ORDER — SODIUM CHLORIDE 0.9% FLUSH
10.0000 mL | Freq: Once | INTRAVENOUS | Status: AC
  Administered 2021-03-14: 10 mL via INTRAVENOUS

## 2021-03-14 MED ORDER — HEPARIN SOD (PORK) LOCK FLUSH 100 UNIT/ML IV SOLN
500.0000 [IU] | Freq: Once | INTRAVENOUS | Status: AC
  Administered 2021-03-14: 500 [IU] via INTRAVENOUS

## 2021-03-14 NOTE — Patient Instructions (Signed)
Therapeutic Phlebotomy Therapeutic phlebotomy is the planned removal of blood from a person's body for the purpose of treating a medical condition. The procedure is similar to donating blood. Usually, about a pint (470 mL, or 0.47 L) of blood is removed. The average adult has 9-12 pints (4.3-5.7 L) of blood in the body. Therapeutic phlebotomy may be used to treat the following medical conditions: Hemochromatosis. This is a condition in which the blood contains too much iron. Polycythemia vera. This is a condition in which the blood contains too many red blood cells. Porphyria cutanea tarda. This is a disease in which an important part of hemoglobin is not made properly. It results in the buildup of abnormal amounts of porphyrins in the body. Sickle cell disease. This is a condition in which the red blood cells form an abnormal crescent shape rather than a round shape. Tell a health care provider about: Any allergies you have. All medicines you are taking, including vitamins, herbs, eye drops, creams, and over-the-counter medicines. Any problems you or family members have had with anesthetic medicines. Any blood disorders you have. Any surgeries you have had. Any medical conditions you have. Whether you are pregnant or may be pregnant. What are the risks? Generally, this is a safe procedure. However, problems may occur, including: Nausea or light-headedness. Low blood pressure (hypotension). Soreness, bleeding, swelling, or bruising at the needle insertion site. Infection. What happens before the procedure? Follow instructions from your health care provider about eating or drinking restrictions. Ask your health care provider about: Changing or stopping your regular medicines. This is especially important if you are taking diabetes medicines or blood thinners (anticoagulants). Taking medicines such as aspirin and ibuprofen. These medicines can thin your blood. Do not take these medicines  unless your health care provider tells you to take them. Taking over-the-counter medicines, vitamins, herbs, and supplements. Wear clothing with sleeves that can be raised above the elbow. Plan to have someone take you home from the hospital or clinic. You may have a blood sample taken. Your blood pressure, pulse rate, and breathing rate will be measured. What happens during the procedure?  To lower your risk of infection: Your health care team will wash or sanitize their hands. Your skin will be cleaned with an antiseptic. You may be given a medicine to numb the area (local anesthetic). A tourniquet will be placed on your arm. A needle will be inserted into one of your veins. Tubing and a collection bag will be attached to that needle. Blood will flow through the needle and tubing into the collection bag. The collection bag will be placed lower than your arm to allow gravity to help the flow of blood into the bag. You may be asked to open and close your hand slowly and continually during the entire collection. After the specified amount of blood has been removed from your body, the collection bag and tubing will be clamped. The needle will be removed from your vein. Pressure will be held on the site of the needle insertion to stop the bleeding. A bandage (dressing) will be placed over the needle insertion site. The procedure may vary among health care providers and hospitals. What happens after the procedure? Your blood pressure, pulse rate, and breathing rate will be measured after the procedure. You will be encouraged to drink fluids. Your recovery will be assessed and monitored. You can return to your normal activities as told by your health care provider. Summary Therapeutic phlebotomy is the planned removal   of blood from a person's body for the purpose of treating a medical condition. Therapeutic phlebotomy may be used to treat hemochromatosis, polycythemia vera, porphyria cutanea  tarda, or sickle cell disease. In the procedure, a needle is inserted and about a pint (470 mL, or 0.47 L) of blood is removed. The average adult has 9-12 pints (4.3-5.7 L) of blood in the body. This is generally a safe procedure, but it can sometimes cause problems such as nausea, light-headedness, or low blood pressure (hypotension). This information is not intended to replace advice given to you by your health care provider. Make sure you discuss any questions you have with your health care provider. Document Revised: 08/21/2020 Document Reviewed: 05/28/2017 Elsevier Patient Education  2022 [employer].  

## 2021-03-14 NOTE — Progress Notes (Signed)
Phlebotomy started at 15:10 pm and ended 16:00pm. 500 grams of blood removed via 20G PAC needle. Patient observed for 30 minutes post phlebotomy. Patient had drinks and snack post phlebotomy. Vital signs stable and charted on Flowsheet.

## 2021-03-20 ENCOUNTER — Ambulatory Visit
Admission: RE | Admit: 2021-03-20 | Discharge: 2021-03-20 | Disposition: A | Discharge status: Home / Self Care | Payer: PPO | Source: Ambulatory Visit | Attending: Hematology & Oncology | Admitting: Hematology & Oncology

## 2021-03-20 ENCOUNTER — Encounter: Payer: Self-pay | Admitting: *Deleted

## 2021-03-20 DIAGNOSIS — Z95828 Presence of other vascular implants and grafts: Secondary | ICD-10-CM

## 2021-03-20 DIAGNOSIS — T82598A Other mechanical complication of other cardiac and vascular devices and implants, initial encounter: Secondary | ICD-10-CM | POA: Diagnosis not present

## 2021-03-20 HISTORY — PX: IR RADIOLOGIST EVAL & MGMT: IMG5224

## 2021-03-20 NOTE — Progress Notes (Signed)
Chief Complaint: Patient was seen in consultation today for possible port revision/replacement at the request of Ennever,Peter R  Referring Physician(s): Ennever,Peter R  History of Present Illness: Lori [name] is a 75 y.o. female with a history of hemochromatosis and poor venous access. She is known to me from prior porta-cath placement on 04/27/2020.  The port has been functioning well since that time but she states that it protrudes more after losing weight, is frequently painful at the site of the port reservoir and constantly itchy.  She has not had any clinical infection at the port site or fever.  She also mention today that she has a nonhealing wound of the left lower medial leg just above the ankle which has been treated by Dermatology.  She does not feel that it is improving.  Past Medical History:  Diagnosis Date   Autoimmune hepatitis ([place])    Cancer ([place]) 06/2017   SQUAMOS CELL REMOVED FROM FOREHEAD   Chronic fatigue    Complication of anesthesia    Partial Hysterectomy-Severe pain that radiated to esophogaus, and 12 more episode    Hemochromatosis    Hormone disorder    Hypothyroidism    IBS (irritable bowel syndrome)    Lupus ([facility])    Mixed connective tissue disease ([place])    Osteoporosis 07/2017   T score -2.7 stable from prior DEXA   Palpitations    Rheumatoid arthritis ([place])     Past Surgical History:  Procedure Laterality Date   ABDOMINAL HYSTERECTOMY     Partial; pt states has only had ovaries removed   ABDOMINAL SURGERY     ABDOMINOPLASTY   BLADDER SUSPENSION     BREAST SURGERY  1980   LUMPECTOMY X 2 FROM RIGHT BREAST   CARDIOVASCULAR STRESS TEST  08/28/2011   Normal   CAROTID DOPPLER  08/28/2011   Normal, no evidence of significant diameter reduction, dissection, or vascular abnormality   COLONOSCOPY W/ POLYPECTOMY  2018   IR IMAGING GUIDED PORT INSERTION  04/27/2020   LAPAROSCOPIC CHOLECYSTECTOMY     phlebotomies     TONSILLECTOMY      TONSILLECTOMY AND ADENOIDECTOMY     TRANSTHORACIC ECHOCARDIOGRAM  01/29/2011   EF >55%, mild concentric LVH   TUBAL LIGATION      Allergies: Albumin (human), Amoxicillin, Codeine, Erythromycin, Iodinated diagnostic agents, Keflex [cephalexin], Meperidine, Metoclopramide hcl, Other, Percocet [oxycodone-acetaminophen], Shellfish allergy, Shellfish-derived products, and Oxycodone  Medications: Prior to Admission medications   Medication Sig Start Date End Date Taking? Authorizing Provider  amiloride-hydrochlorothiazide (MODURETIC) 5-50 MG tablet Take 0.5 tablets by mouth daily. As Needed 11/21/16   [provider]  Ascorbic Acid (VITAMIN C) 1000 MG tablet Take 1,000 mg by mouth daily.     [provider]  Cholecalciferol (VITAMIN D PO) Take 1 tablet by mouth daily.    [provider]  dextroamphetamine (DEXEDRINE SPANSULE) 5 MG 24 hr capsule Take 5 mg by mouth daily. 06/30/14   [provider]  doxycycline (VIBRA-TABS) 100 MG tablet Take 100 mg by mouth 2 (two) times daily. As Needed    [provider]  fish oil-omega-3 fatty acids 1000 MG capsule Take 1 g by mouth daily.    [provider]  fluconazole (DIFLUCAN) 200 MG tablet Take 100 mg by mouth daily as needed (yeast).    [provider]  hydroxychloroquine (PLAQUENIL) 200 MG tablet Take 200 mg by mouth daily.    [provider]  MELATONIN PO Take 2 mg  by mouth at bedtime.     [provider]  metoprolol succinate (TOPROL-XL) 25 MG 24 hr tablet TAKE 1 TABLET IN AM AND 1/2 TABLET IN THE PM. PATIENT NEED TO ATTEND UPCOMING APPOINTMENT FOR FUTURE REFILLS. Patient taking differently: Take 25 mg by mouth daily. TAKE 1 TABLET IN AM AND 1/2 TABLET IN THE PM. PATIENT NEED TO ATTEND UPCOMING APPOINTMENT FOR FUTURE REFILLS. 10/30/20   Troy Sine, MD  nystatin (MYCOSTATIN) 500000 units TABS tablet Take 1 tablet by mouth at bedtime. 03/19/20   [provider]   potassium chloride SA (K-DUR,KLOR-CON) 20 MEQ tablet Take 20 mEq by mouth daily.  03/09/14   [provider]  PRASTERONE, DHEA, PO Take 28 mg by mouth daily.     [provider]  Probiotic Product (PROBIOTIC DAILY PO) Take 1 capsule by mouth daily.    [provider]  progesterone (PROMETRIUM) 100 MG capsule Take 100 mg by mouth daily.     [provider]  QUERCETIN PO Take 1 capsule by mouth daily.    [provider]  thyroid (ARMOUR) 15 MG tablet Take 15 mg by mouth 3 (three) times a week.    [provider]  thyroid (ARMOUR) 60 MG tablet Take 60 mg by mouth daily before breakfast.    [provider]  vitamin B-12 (CYANOCOBALAMIN) 1000 MCG tablet Take 1,000 mcg by mouth daily.    [provider]  zinc gluconate 50 MG tablet Take 50 mg by mouth daily.    [provider]     Family History  Problem Relation Age of Onset   Seizures Mother    Transient ischemic attack Mother    Glaucoma Mother    Cancer Father 4       COLON   Diabetes Father    Heart disease Father    Breast cancer Maternal Aunt    Heart disease Paternal Uncle    Breast cancer Maternal Aunt    Glaucoma Maternal Grandmother     Social History   Socioeconomic History   Marital status: Married    Spouse name: Not on file   Number of children: Not on file   Years of education: Not on file   Highest education level: Not on file  Occupational History   Not on file  Tobacco Use   Smoking status: Never   Smokeless tobacco: Never  Vaping Use   Vaping Use: Never used  Substance and Sexual Activity   Alcohol use: Yes    Alcohol/week: 0.0 standard drinks    Comment: RARE   Drug use: No   Sexual activity: Not Currently  Other Topics Concern   Not on file  Social History Narrative   Not on file   Social Determinants of Health   Financial Resource Strain: Not on file  Food Insecurity: Not on file  Transportation Needs: Not on  file  Physical Activity: Not on file  Stress: Not on file  Social Connections: Not on file    Review of Systems: A 12 point ROS discussed and pertinent positives are indicated in the HPI above.  All other systems are negative.  Review of Systems  Constitutional: Negative.   Respiratory: Negative.    Cardiovascular: Negative.   Gastrointestinal: Negative.   Genitourinary: Negative.   Musculoskeletal:        Non-healing ulcer/wound of lower left leg without edema.  Skin:  Positive for wound.  Neurological: Negative.    Vital Signs: BP Marland Kitchen)  118/59 (BP Location: Left Arm)   Pulse 89   SpO2 98%   Physical Exam Vitals reviewed.  Constitutional:      General: She is not in acute distress.    Appearance: She is not ill-appearing, toxic-appearing or diaphoretic.  Musculoskeletal:     Comments: Chest: Right chest port site healed and intact with no erythema, fluctuance or wound. No skin breakdown or ulcer over port reservoir or tunneled catheter. Port site nontender.  Left lower extremity: 2 cm rounded ulcer of left lower calf just above medial malleolus with some granulation tissue at base. No purulent discharge. Some erythema of skin around ulcer.  Neurological:     General: No focal deficit present.     Mental Status: She is alert and oriented to person, place, and time.    Imaging: No results found.  Labs:  CBC: Recent Labs    08/03/20 1320 09/20/20 1123 12/17/20 1411 01/31/21 1556  WBC 6.7 7.0 6.6 9.6  HGB 14.1 13.7 13.7 13.6  HCT 40.8 40.1 41.0 39.9  PLT 236 216 182 219    COAGS: Recent Labs    04/27/20 1250  INR 1.0    BMP: Recent Labs    08/03/20 1320 09/20/20 1123 12/17/20 1411 01/31/21 1556  NA 131* 137 138 133*  K 3.7 3.9 3.6 3.6  CL 96* 104 105 101  CO2 27 27 26 24   GLUCOSE 86 95 106* 90  BUN 15 10 15 11   CALCIUM 10.2 9.7 10.0 9.4  CREATININE 0.74 0.70 0.71 0.70  GFRNONAA >60 >60 >60 >60    LIVER FUNCTION TESTS: Recent Labs     08/03/20 1320 09/20/20 1123 12/17/20 1411 01/31/21 1556  BILITOT 0.6 0.6 0.5 0.8  AST 26 24 30  34  ALT 20 23 25 27   ALKPHOS 86 59 70 75  PROT 6.9 6.4* 6.5 6.9  ALBUMIN 4.6 4.1 4.2 4.3     Assessment and Plan:  I addressed Ms. Kai's symptoms regarding her indwelling Port-A-Cath.  The port reservoir does protrude slightly but the skin and soft tissues overlying the reservoir are completely intact and there is no evidence of infection.  I told her that in order to create a new pocket for a port in a different location and place the reservoir slightly deeper, she would require an entirely new Port-A-Cath given that the tunneled catheter cannot be transposed to a new pocket.  There is no guarantee that a new port would also not be symptomatic with regard to some skin or soft tissue irritation that she seems to be experiencing.  I told her that I would certainly be willing to place a new port given that she is symptomatic and will require a port long-term for chronic phlebotomy and blood work.  She would like to think about new port placement and will contact me if she would like to proceed with scheduling a procedure.  With regard to the left calf nonhealing ulcer, the only recommendation I could make to Ms. Skeet was to get an opinion from the [facility] at [facility], who might have some suggestions regarding local care.  We will try to facilitate a referral to the wound care center.   Electronically Signed: Azzie Roup 03/20/2021, 10:52 AM    I spent a total of 15 Minutes in face to face in clinical consultation, greater than 50% of which was counseling/coordinating care for an indwelling port and possible port revision/replacement.

## 2021-03-21 ENCOUNTER — Ambulatory Visit (INDEPENDENT_AMBULATORY_CARE_PROVIDER_SITE_OTHER): Payer: PPO | Admitting: Obstetrics & Gynecology

## 2021-03-21 ENCOUNTER — Other Ambulatory Visit: Payer: Self-pay

## 2021-03-21 ENCOUNTER — Encounter: Payer: Self-pay | Admitting: Obstetrics & Gynecology

## 2021-03-21 VITALS — BP 110/70 | HR 72 | Resp 16 | Ht 65.75 in | Wt 149.0 lb

## 2021-03-21 DIAGNOSIS — Z01419 Encounter for gynecological examination (general) (routine) without abnormal findings: Secondary | ICD-10-CM

## 2021-03-21 DIAGNOSIS — E785 Hyperlipidemia, unspecified: Secondary | ICD-10-CM | POA: Diagnosis not present

## 2021-03-21 DIAGNOSIS — M810 Age-related osteoporosis without current pathological fracture: Secondary | ICD-10-CM | POA: Diagnosis not present

## 2021-03-21 DIAGNOSIS — R7301 Impaired fasting glucose: Secondary | ICD-10-CM | POA: Diagnosis not present

## 2021-03-21 DIAGNOSIS — E039 Hypothyroidism, unspecified: Secondary | ICD-10-CM | POA: Diagnosis not present

## 2021-03-21 DIAGNOSIS — Z780 Asymptomatic menopausal state: Secondary | ICD-10-CM | POA: Diagnosis not present

## 2021-03-21 NOTE — Progress Notes (Addendum)
Lori [name] 1946-03-19 [id number]   History:    75 y.o. [id number] Married.   RP:  Established patient presenting for annual gyn exam    HPI: Postmenopause, well on no HRT.  No PMB.  No pelvic pain.  Abstinent, husband has erection dysfunction.  Pap Neg 2019.  Urine and bowel movements normal.  Breasts normal.  Schedule mammo now. Body mass index 24.23. Physically active, had hip replacement in 04/2020.  Health labs with family physician.  Colono 2022.  BD Osteoporosis 02/2020, Reclast x 1 dose.    Past medical history,surgical history, family history and social history were all reviewed and documented in the EPIC chart.  Gynecologic History No LMP recorded. Patient is postmenopausal.  Obstetric History OB History  Gravida Para Term Preterm AB Living  4 3 3   1 3   SAB IAB Ectopic Multiple Live Births  1       3    # Outcome Date GA Lbr Len/2nd Weight Sex Delivery Anes PTL Lv  4 SAB           3 Term     F Vag-Spont  N LIV  2 Term     M Vag-Spont  N LIV  1 Term     F Vag-Spont  N LIV     ROS: A ROS was performed and pertinent positives and negatives are included in the history.  GENERAL: No fevers or chills. HEENT: No change in vision, no earache, sore throat or sinus congestion. NECK: No pain or stiffness. CARDIOVASCULAR: No chest pain or pressure. No palpitations. PULMONARY: No shortness of breath, cough or wheeze. GASTROINTESTINAL: No abdominal pain, nausea, vomiting or diarrhea, melena or bright red blood per rectum. GENITOURINARY: No urinary frequency, urgency, hesitancy or dysuria. MUSCULOSKELETAL: No joint or muscle pain, no back pain, no recent trauma. DERMATOLOGIC: No rash, no itching, no lesions. ENDOCRINE: No polyuria, polydipsia, no heat or cold intolerance. No recent change in weight. HEMATOLOGICAL: No anemia or easy bruising or bleeding. NEUROLOGIC: No headache, seizures, numbness, tingling or weakness. PSYCHIATRIC: No depression, no loss of interest in normal  activity or change in sleep pattern.     Exam:   BP 110/70   Pulse 72   Resp 16   Ht 5' 5.75" (1.67 m)   Wt 149 lb (67.6 kg)   BMI 24.23 kg/m   Body mass index is 24.23 kg/m.  General appearance : Well developed well nourished female. No acute distress HEENT: Eyes: no retinal hemorrhage or exudates,  Neck supple, trachea midline, no carotid bruits, no thyroidmegaly Lungs: Clear to auscultation, no rhonchi or wheezes, or rib retractions  Heart: Regular rate and rhythm, no murmurs or gallops Breast:Examined in sitting and supine position were symmetrical in appearance, no palpable masses or tenderness,  no skin retraction, no nipple inversion, no nipple discharge, no skin discoloration, no axillary or supraclavicular lymphadenopathy Abdomen: no palpable masses or tenderness, no rebound or guarding Extremities: no edema or skin discoloration or tenderness  Pelvic: Vulva: Normal             Vagina: No gross lesions or discharge  Cervix: No gross lesions or discharge  Uterus  AV, normal size, shape and consistency, non-tender and mobile  Adnexa  Without masses or tenderness  Anus: Normal   Assessment/Plan:  75 y.o. female for annual exam   1. Well female exam with routine gynecological exam Postmenopause, well on no HRT.  No PMB.  No pelvic pain.  Abstinent,  husband has erection dysfunction.  Pap Neg 2019.  Urine and bowel movements normal. Breasts normal.  Schedule mammo now. Body mass index 24.23. Physically active, had hip replacement in 04/2020.  Health labs with family physician.  Colono 2022.  BD Osteoporosis 02/2020, Reclast x 1 dose.  2. Postmenopause Postmenopause, well on no HRT.  No PMB.   - DG Bone Density; Future  3. Age-related osteoporosis without current pathological fracture BD Osteoporosis 02/2020, Reclast x 1 dose.  Repeat BD in 1 year.  Vit D, Ca++ 1.5 g/d total, regular wt bearing physical activities. - DG Bone Density; Future  Other orders - cephALEXin  (KEFLEX) 500 MG capsule; Take 500 mg by mouth 3 (three) times daily. - mupirocin ointment (BACTROBAN) 2 %; SMARTSIG:1 Application Topical 2-3 Times Daily - Multiple Vitamin (MULTIVITAMIN PO); Take by mouth.   Princess Bruins MD, 12:26 PM 03/21/2021

## 2021-03-22 ENCOUNTER — Encounter: Payer: Self-pay | Admitting: Obstetrics & Gynecology

## 2021-03-25 DIAGNOSIS — M351 Other overlap syndromes: Secondary | ICD-10-CM | POA: Diagnosis not present

## 2021-03-25 DIAGNOSIS — R7301 Impaired fasting glucose: Secondary | ICD-10-CM | POA: Diagnosis not present

## 2021-03-25 DIAGNOSIS — R5382 Chronic fatigue, unspecified: Secondary | ICD-10-CM | POA: Diagnosis not present

## 2021-03-25 DIAGNOSIS — E785 Hyperlipidemia, unspecified: Secondary | ICD-10-CM | POA: Diagnosis not present

## 2021-03-25 DIAGNOSIS — Z148 Genetic carrier of other disease: Secondary | ICD-10-CM | POA: Diagnosis not present

## 2021-03-25 DIAGNOSIS — M810 Age-related osteoporosis without current pathological fracture: Secondary | ICD-10-CM | POA: Diagnosis not present

## 2021-03-25 DIAGNOSIS — Z800 Family history of malignant neoplasm of digestive organs: Secondary | ICD-10-CM | POA: Diagnosis not present

## 2021-03-25 DIAGNOSIS — Z0000 Encounter for general adult medical examination without abnormal findings: Secondary | ICD-10-CM | POA: Diagnosis not present

## 2021-03-25 DIAGNOSIS — Z1331 Encounter for screening for depression: Secondary | ICD-10-CM | POA: Diagnosis not present

## 2021-03-25 DIAGNOSIS — Z1339 Encounter for screening examination for other mental health and behavioral disorders: Secondary | ICD-10-CM | POA: Diagnosis not present

## 2021-03-25 DIAGNOSIS — D692 Other nonthrombocytopenic purpura: Secondary | ICD-10-CM | POA: Diagnosis not present

## 2021-03-25 DIAGNOSIS — L989 Disorder of the skin and subcutaneous tissue, unspecified: Secondary | ICD-10-CM | POA: Diagnosis not present

## 2021-03-25 DIAGNOSIS — K219 Gastro-esophageal reflux disease without esophagitis: Secondary | ICD-10-CM | POA: Diagnosis not present

## 2021-03-25 DIAGNOSIS — E039 Hypothyroidism, unspecified: Secondary | ICD-10-CM | POA: Diagnosis not present

## 2021-03-25 IMAGING — XA IR IMAGING GUIDED PORT INSERTION
1 series · 1 of 1 positions shown · non-contrast
Comparison: none

CLINICAL DATA: History of hemochromatosis and poor venous access.
Need for porta cath for chronic phlebotomy.

[Series 300: ir imaging guided port insertion · 1 of 1 slices shown]
[im 1/1]
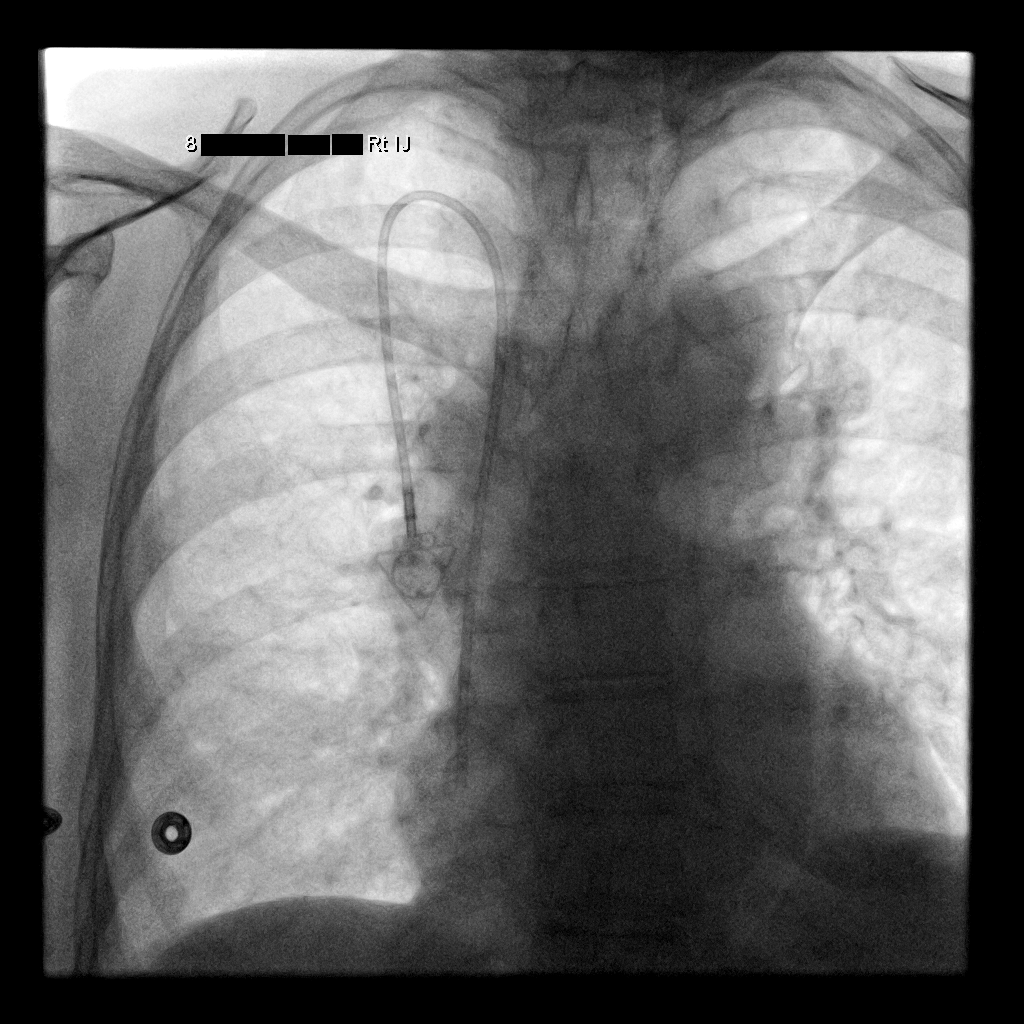

[1 of 1 positions shown; findings below may reference images not displayed]

EXAM:
IMPLANTED PORT A CATH PLACEMENT WITH ULTRASOUND AND FLUOROSCOPIC
GUIDANCE

ANESTHESIA/SEDATION:
2.0 mg IV Versed; 100 mcg IV Fentanyl

Total Moderate Sedation Time:  30 minutes

The patient's level of consciousness and physiologic status were
continuously monitored during the procedure by Radiology nursing.

Additional Medications: 900 mg IV clindamycin.

FLUOROSCOPY TIME:  18 seconds.  3.0 mGy

PROCEDURE:
The procedure, risks, benefits, and alternatives were explained to
the patient. Questions regarding the procedure were encouraged and
answered. The patient understands and consents to the procedure. A
time-out was performed prior to initiating the procedure.

Ultrasound was utilized to confirm patency of the right internal
jugular vein. The right neck and chest were prepped with
chlorhexidine in a sterile fashion, and a sterile drape was applied
covering the operative field. Maximum barrier sterile technique with
sterile gowns and gloves were used for the procedure. Local
anesthesia was provided with 1% lidocaine.

After creating a small venotomy incision, a 21 gauge needle was
advanced into the right internal jugular vein under direct,
real-time ultrasound guidance. Ultrasound image documentation was
performed. After securing guidewire access, an 8 Fr dilator was
placed. A J-wire was kinked to measure appropriate catheter length.

A subcutaneous port pocket was then created along the upper chest
wall utilizing sharp and blunt dissection. Portable cautery was
utilized. The pocket was irrigated with sterile saline.

A single lumen power injectable port was chosen for placement. The 8
Fr catheter was tunneled from the port pocket site to the venotomy
incision. The port was placed in the pocket. External catheter was
trimmed to appropriate length based on guidewire measurement.

At the venotomy, an 8 Fr peel-away sheath was placed over a
guidewire. The catheter was then placed through the sheath and the
sheath removed. Final catheter positioning was confirmed and
documented with a fluoroscopic spot image. The port was accessed
with a needle and aspirated and flushed with heparinized saline. The
access needle was removed.

The venotomy and port pocket incisions were closed with subcutaneous
3-0 Monocryl and subcuticular 4-0 Vicryl. Dermabond was applied to
both incisions.

COMPLICATIONS:
COMPLICATIONS
None
FINDINGS: After catheter placement, the tip lies at the [place] junction.
The catheter aspirates normally and is ready for immediate use.
IMPRESSION: Placement of single lumen port a cath via right internal jugular
vein. The catheter tip lies at the [place] junction. A power
injectable port a cath was placed and is ready for immediate use.

## 2021-03-28 DIAGNOSIS — D485 Neoplasm of uncertain behavior of skin: Secondary | ICD-10-CM | POA: Diagnosis not present

## 2021-03-28 DIAGNOSIS — C44729 Squamous cell carcinoma of skin of left lower limb, including hip: Secondary | ICD-10-CM | POA: Diagnosis not present

## 2021-03-28 DIAGNOSIS — Z85828 Personal history of other malignant neoplasm of skin: Secondary | ICD-10-CM | POA: Diagnosis not present

## 2021-04-15 ENCOUNTER — Encounter (HOSPITAL_BASED_OUTPATIENT_CLINIC_OR_DEPARTMENT_OTHER): Payer: PPO | Admitting: Internal Medicine

## 2021-04-17 ENCOUNTER — Other Ambulatory Visit: Payer: Self-pay

## 2021-04-17 ENCOUNTER — Encounter (HOSPITAL_BASED_OUTPATIENT_CLINIC_OR_DEPARTMENT_OTHER): Payer: PPO | Attending: Physician Assistant | Admitting: Physician Assistant

## 2021-04-17 DIAGNOSIS — L97822 Non-pressure chronic ulcer of other part of left lower leg with fat layer exposed: Secondary | ICD-10-CM | POA: Insufficient documentation

## 2021-04-17 DIAGNOSIS — C44729 Squamous cell carcinoma of skin of left lower limb, including hip: Secondary | ICD-10-CM | POA: Diagnosis not present

## 2021-04-17 DIAGNOSIS — M329 Systemic lupus erythematosus, unspecified: Secondary | ICD-10-CM | POA: Insufficient documentation

## 2021-04-17 DIAGNOSIS — M0689 Other specified rheumatoid arthritis, multiple sites: Secondary | ICD-10-CM | POA: Diagnosis not present

## 2021-04-23 NOTE — Progress Notes (Signed)
Lori [name], Lori [name] ([id number]) Visit Report for 04/17/2021 Chief Complaint Document Details Patient Name: Date of Service: Lori Mt NNE B. 04/17/2021 1:15 PM Medical Record Number: [id number] Patient Account Number: [id number] Date of Birth/Sex: Treating RN: 1946/01/10 (75 y.o. Female) Lori [name] Primary Care Provider: Marton [name] Other Clinician: Referring Provider: Treating Provider/Extender: Lori [name] in Treatment: 0 Information Obtained from: Patient Chief Complaint Squamous cell carcinoma left leg Electronic Signature(s) Signed: 04/17/2021 3:05:14 PM By: Lori Keeler PA-C Entered By: Lori [name] on 04/17/2021 15:05:13 -------------------------------------------------------------------------------- Debridement Details Patient Name: Date of Service: Lori Mt NNE B. 04/17/2021 1:15 PM Medical Record Number: [id number] Patient Account Number: [id number] Date of Birth/Sex: Treating RN: 01-07-1946 (75 y.o. Female) Lori [name] Primary Care Provider: Marton [name] Other Clinician: Referring Provider: Treating Provider/Extender: Lori [name] in Treatment: 0 Debridement Performed for Assessment: Wound #1 Left,Medial Lower Leg Performed By: Physician Lori Keeler, PA Debridement Type: Chemical/Enzymatic/Mechanical Agent Used: Santyl Level of Consciousness (Pre-procedure): Awake and Alert Pre-procedure Verification/Time Out Yes - 15:19 Taken: Start Time: 15:20 Pain Control: Other : Benzocaine Bleeding: None End Time: 15:23 Response to Treatment: Procedure was tolerated well Level of Consciousness (Post- Awake and Alert procedure): Post Debridement Measurements of Total Wound Length: (cm) 1 Width: (cm) 1 Depth: (cm) 0.1 Volume: (cm) 0.079 Character of Wound/Ulcer Post Debridement: Stable Post Procedure Diagnosis Same as Pre-procedure Electronic Signature(s) Signed: 04/17/2021 4:59:20 PM By:  Lori [name] Signed: 04/17/2021 5:33:12 PM By: Lori Keeler PA-C Signed: 04/17/2021 5:33:12 PM By: Lori Keeler PA-C Entered By: Lori [name] on 04/17/2021 15:23:49 -------------------------------------------------------------------------------- HPI Details Patient Name: Date of Service: Lori Battiest, [name] 04/17/2021 1:15 PM Medical Record Number: [id number] Patient Account Number: [id number] Date of Birth/Sex: Treating RN: 04/05/1946 (75 y.o. Female) Lori [name] Primary Care Provider: Marton [name] Other Clinician: Referring Provider: Treating Provider/Extender: Lori [name] in Treatment: 0 History of Present Illness HPI Description: 04/17/2021 patient presents today for initial inspection here in our clinic concerning issues that she has been having with a wound on her left lower extremity. This was actually off 4 November surgically excised according to what she tells me by her physician and sent for pathology. It is very confusing whether or not the pathology report showed clear margins though the patient's been referred to the Mohs surgeon in order to ensure the "edges are clear". Again I am uncertain if this is something she insisted on or for physician solid evidence on the pathology report this was necessary either way I cannot be certain the squamous cell carcinoma is cleared at this point. Apparently that was the diagnosis. Either way this means I will not be performing any debridement at this time. With regard to her other major medical problems she does have hereditary hemochromatosis as well as systemic lupus and rheumatoid arthritis. She also tells me that she has been using some unconventional things including "food grade peroxide, ethylene glycol (which she apparently found online as a treatment for her condition), and has been on doxycycline". Electronic Signature(s) Signed: 04/17/2021 5:30:16 PM By: Lori Keeler PA-C Entered By:  Lori [name] on 04/17/2021 17:30:15 -------------------------------------------------------------------------------- Physical Exam Details Patient Name: Date of Service: Lori Mt NNE B. 04/17/2021 1:15 PM Medical Record Number: [id number] Patient Account Number: [id number] Date of Birth/Sex: Treating RN: 12-07-1945 (75 y.o. Female) Lori [name] Primary Care Provider: Marton [name] Other Clinician: Referring Provider: Treating Provider/Extender: Lori [name]  Weeks in Treatment: 0 Constitutional sitting or standing blood pressure is within target range for patient.. pulse regular and within target range for patient.Marland Kitchen respirations regular, non-labored and within target range for patient.Marland Kitchen temperature within target range for patient.. Well-nourished and well-hydrated in no acute distress. Eyes conjunctiva clear no eyelid edema noted. pupils equal round and reactive to light and accommodation. Ears, Nose, Mouth, and Throat no gross abnormality of ear auricles or external auditory canals. normal hearing noted during conversation. mucus membranes moist. Respiratory normal breathing without difficulty. Cardiovascular 2+ dorsalis pedis/posterior tibialis pulses. no clubbing, cyanosis, significant edema, <3 sec cap refill. Musculoskeletal normal gait and posture. no significant deformity or arthritic changes, no loss or range of motion, no clubbing. Psychiatric this patient is able to make decisions and demonstrates good insight into disease process. Alert and Oriented x 3. pleasant and cooperative. Notes Upon inspection patient's wound is pretty much eschar covered at this point I really think we need to do something to try to help clear this away. I discussed that with her today and I think the best option probably can be Santyl. Electronic Signature(s) Signed: 04/17/2021 5:30:38 PM By: Lori Keeler PA-C Entered By: Lori [name] on 04/17/2021  17:30:38 -------------------------------------------------------------------------------- Physician Orders Details Patient Name: Date of Service: Lori Mt NNE B. 04/17/2021 1:15 PM Medical Record Number: [id number] Patient Account Number: [id number] Date of Birth/Sex: Treating RN: 15-May-1946 (75 y.o. Female) Lori [name] Primary Care Provider: Marton [name] Other Clinician: Referring Provider: Treating Provider/Extender: Lori [name] in Treatment: 0 Verbal / Phone Orders: No Diagnosis Coding ICD-10 Coding Code Description C44.729 Squamous cell carcinoma of skin of left lower limb, including hip L97.822 Non-pressure chronic ulcer of other part of left lower leg with fat layer exposed E83.110 Hereditary hemochromatosis M32.9 Systemic lupus erythematosus, unspecified M06.89 Other specified rheumatoid arthritis, multiple sites Follow-up Appointments ppointment in 1 week. - with Margarita Grizzle Return A Other: - [facility]=Supplies Bathing/ Shower/ Hygiene May shower and wash wound with soap and water. - Use antibacterial soap when changing dressing. Edema Control - Lymphedema / SCD / Other Avoid standing for long periods of time. Moisturize legs daily. Additional Orders / Instructions Follow Nutritious Diet - High Protein Diet, 100-120g daily Other: - Follow up at [facility] #1 - Lower Leg Wound Laterality: Left, Medial Cleanser: Soap and Water 1 x Per Day/30 Days Discharge Instructions: May shower and wash wound with dial antibacterial soap and water prior to dressing change. Peri-Wound Care: Skin Prep 1 x Per Day/30 Days Discharge Instructions: Use skin prep as directed Prim Dressing: Santyl Ointment 1 x Per Day/30 Days ary Discharge Instructions: Apply nickel thick amount to wound bed as instructed Secondary Dressing: Woven Gauze Sponges 2x2 in 1 x Per Day/30 Days Discharge Instructions: Apply saline moistened gauze over  Santyl Secondary Dressing: Zetuvit Plus Silicone Border Dressing 4x4 (in/in) 1 x Per Day/30 Days Discharge Instructions: Apply silicone border over primary dressing as directed. Add-Ons: AddiPak Unit Dose Saline 5 (ml) 1 x Per Day/30 Days Discharge Instructions: to moisten gauze for dressing Patient Medications llergies: albumin human, amoxicillin, codeine, Keflex, Percocet, shellfish derived A Notifications Medication Indication Start End 04/17/2021 Santyl DOSE topical 250 unit/gram ointment - ointment topical Apply nickel thick daily to the wound bed and then cover with a dressing as directed in clinic x 30 days Electronic Signature(s) Signed: 04/17/2021 3:33:37 PM By: Lori Keeler PA-C Entered By: Lori [name] on 04/17/2021 15:33:37 -------------------------------------------------------------------------------- Problem List Details  Patient Name: Date of Service: Lori Mt NNE B. 04/17/2021 1:15 PM Medical Record Number: [id number] Patient Account Number: [id number] Date of Birth/Sex: Treating RN: 07-15-1945 (75 y.o. Female) Lori [name] Primary Care Provider: Marton [name] Other Clinician: Referring Provider: Treating Provider/Extender: Lori [name] in Treatment: 0 Active Problems ICD-10 Encounter Code Description Active Date MDM Diagnosis C44.729 Squamous cell carcinoma of skin of left lower limb, including hip 04/17/2021 No Yes L97.822 Non-pressure chronic ulcer of other part of left lower leg with fat layer exposed 04/17/2021 No Yes E83.110 Hereditary hemochromatosis 04/17/2021 No Yes M32.9 Systemic lupus erythematosus, unspecified 04/17/2021 No Yes M06.89 Other specified rheumatoid arthritis, multiple sites 04/17/2021 No Yes Inactive Problems Resolved Problems Electronic Signature(s) Signed: 04/17/2021 3:04:52 PM By: Lori Keeler PA-C Entered By: Lori [name] on 04/17/2021  15:04:52 -------------------------------------------------------------------------------- Progress Note Details Patient Name: Date of Service: Lori Mt NNE B. 04/17/2021 1:15 PM Medical Record Number: [id number] Patient Account Number: [id number] Date of Birth/Sex: Treating RN: 1946-04-09 (75 y.o. Female) Lori [name] Primary Care Provider: Marton [name] Other Clinician: Referring Provider: Treating Provider/Extender: Lori [name] in Treatment: 0 Subjective Chief Complaint Information obtained from Patient Squamous cell carcinoma left leg History of Present Illness (HPI) 04/17/2021 patient presents today for initial inspection here in our clinic concerning issues that she has been having with a wound on her left lower extremity. This was actually off 4 November surgically excised according to what she tells me by her physician and sent for pathology. It is very confusing whether or not the pathology report showed clear margins though the patient's been referred to the Mohs surgeon in order to ensure the "edges are clear". Again I am uncertain if this is something she insisted on or for physician solid evidence on the pathology report this was necessary either way I cannot be certain the squamous cell carcinoma is cleared at this point. Apparently that was the diagnosis. Either way this means I will not be performing any debridement at this time. With regard to her other major medical problems she does have hereditary hemochromatosis as well as systemic lupus and rheumatoid arthritis. She also tells me that she has been using some unconventional things including "food grade peroxide, ethylene glycol (which she apparently found online as a treatment for her condition), and has been on doxycycline". Patient History Information obtained from Patient. Allergies albumin human, amoxicillin, codeine, Keflex, Percocet, shellfish derived General Notes: Also has  allergies to: Iodinated Diagnostic agents; Oxycodone; Shellfish allergy ; Metoclopramine; Meperidine; Erthyromycin Social History Never smoker, Marital Status - Married, Alcohol Use - Rarely - wine, Drug Use - No History, Caffeine Use - Rarely. Medical History Eyes Patient has history of Glaucoma Denies history of Cataracts, Optic Neuritis Ear/Nose/Mouth/Throat Denies history of Chronic sinus problems/congestion, Middle ear problems Hematologic/Lymphatic Denies history of Anemia, Hemophilia, Human Immunodeficiency Virus, Lymphedema, Sickle Cell Disease Respiratory Patient has history of Asthma Denies history of Aspiration, Chronic Obstructive Pulmonary Disease (COPD), Pneumothorax, Sleep Apnea, Tuberculosis Cardiovascular Denies history of Angina, Arrhythmia, Congestive Heart Failure, Coronary Artery Disease, Deep Vein Thrombosis, Hypertension, Hypotension, Myocardial Infarction, Peripheral Arterial Disease, Peripheral Venous Disease, Phlebitis, Vasculitis Endocrine Denies history of Type I Diabetes, Type II Diabetes Genitourinary Denies history of End Stage Renal Disease Immunological Patient has history of Lupus Erythematosus - Hx of LUPUS Integumentary (Skin) Denies history of History of Burn Musculoskeletal Patient has history of Rheumatoid Arthritis Denies history of Gout, Osteoarthritis, Osteomyelitis Neurologic Denies history of Dementia, Neuropathy, Quadriplegia,  Paraplegia, Seizure Disorder Psychiatric Denies history of Anorexia/bulimia, Confinement Anxiety Hospitalization/Surgery History - Lumpectomy (From R Breast). - Tubal Ligation. Medical A Surgical History Notes nd Eyes Pt. stated she was once diagnosed with glaucoma Gastrointestinal As per pt. has autoimmune hepatitis Integumentary (Skin) Cancer-squamous cell removed from Forehead Oncologic Cancer-Hx Squamous cell removed from forehead Review of Systems (ROS) Constitutional Symptoms (General  Health) Complains or has symptoms of Fatigue - Recently. Denies complaints or symptoms of Fever, Chills, Marked Weight Change. Eyes Complains or has symptoms of Glasses / Contacts. Denies complaints or symptoms of Dry Eyes, Vision Changes. Ear/Nose/Mouth/Throat Complains or has symptoms of Chronic sinus problems or rhinitis, Chronic rhinitis-as per pt. Hematologic/Lymphatic As per pt. has hemochromatosis Respiratory Complains or has symptoms of Chronic or frequent coughs - as per pt. has a frequent cough. Denies complaints or symptoms of Shortness of Breath. Cardiovascular Denies complaints or symptoms of Chest pain. Gastrointestinal Has a Hx of IBS Endocrine Denies complaints or symptoms of Heat/cold intolerance. Genitourinary Complains or has symptoms of Frequent urination - Had a recent UTI-as per pt.. Integumentary (Skin) Complains or has symptoms of Wounds, Wound on Left ankle Musculoskeletal Denies complaints or symptoms of Muscle Pain, Muscle Weakness. Neurologic Denies complaints or symptoms of Numbness/parasthesias. Psychiatric Denies complaints or symptoms of Claustrophobia, Suicidal. General Notes: Patient stated that she has a Port-a-cath Objective Constitutional sitting or standing blood pressure is within target range for patient.. pulse regular and within target range for patient.Marland Kitchen respirations regular, non-labored and within target range for patient.Marland Kitchen temperature within target range for patient.. Well-nourished and well-hydrated in no acute distress. Vitals Time Taken: 1:58 PM, Height: 65.5 in, Source: Stated, Weight: 145 lbs, Source: Stated, BMI: 23.8, Temperature: 99.2 F, Pulse: 85 bpm, Respiratory Rate: 14 breaths/min, Blood Pressure: 125/82 mmHg. Eyes conjunctiva clear no eyelid edema noted. pupils equal round and reactive to light and accommodation. Ears, Nose, Mouth, and Throat no gross abnormality of ear auricles or external auditory canals. normal  hearing noted during conversation. mucus membranes moist. Respiratory normal breathing without difficulty. Cardiovascular 2+ dorsalis pedis/posterior tibialis pulses. no clubbing, cyanosis, significant edema, Musculoskeletal normal gait and posture. no significant deformity or arthritic changes, no loss or range of motion, no clubbing. Psychiatric this patient is able to make decisions and demonstrates good insight into disease process. Alert and Oriented x 3. pleasant and cooperative. General Notes: Upon inspection patient's wound is pretty much eschar covered at this point I really think we need to do something to try to help clear this away. I discussed that with her today and I think the best option probably can be Santyl. Integumentary (Hair, Skin) Wound #1 status is Open. Original cause of wound was Gradually Appeared. The date acquired was: 03/04/2021. The wound is located on the Left,Medial Lower Leg. The wound measures 1cm length x 1cm width x 0.1cm depth; 0.785cm^2 area and 0.079cm^3 volume. There is Fat Layer (Subcutaneous Tissue) exposed. There is no tunneling or undermining noted. There is a medium amount of serosanguineous drainage noted. The wound margin is distinct with the outline attached to the wound base. There is small (1-33%) pink granulation within the wound bed. There is a large (67-100%) amount of necrotic tissue within the wound bed including Eschar. General Notes: Periwound erythema Assessment Active Problems ICD-10 Squamous cell carcinoma of skin of left lower limb, including hip Non-pressure chronic ulcer of other part of left lower leg with fat layer exposed Hereditary hemochromatosis Systemic lupus erythematosus, unspecified Other specified rheumatoid arthritis, multiple sites Procedures  Wound #1 Pre-procedure diagnosis of Wound #1 is a Malignant Wound located on the Left,Medial Lower Leg . There was a Chemical/Enzymatic/Mechanical debridement performed by  Lori Keeler, PA. after achieving pain control using Other (Benzocaine). Agent used was [employer]. A time out was conducted at 15:19, prior to the start of the procedure. There was no bleeding. The procedure was tolerated well. Post Debridement Measurements: 1cm length x 1cm width x 0.1cm depth; 0.079cm^3 volume. Character of Wound/Ulcer Post Debridement is stable. Post procedure Diagnosis Wound #1: Same as Pre-Procedure Plan Follow-up Appointments: Return Appointment in 1 week. - with Margarita Grizzle Other: - [facility]=Supplies Bathing/ Shower/ Hygiene: May shower and wash wound with soap and water. - Use antibacterial soap when changing dressing. Edema Control - Lymphedema / SCD / Other: Avoid standing for long periods of time. Moisturize legs daily. Additional Orders / Instructions: Follow Nutritious Diet - High Protein Diet, 100-120g daily Other: - Follow up at [facility] The following medication(s) was prescribed: Santyl topical 250 unit/gram ointment ointment topical Apply nickel thick daily to the wound bed and then cover with a dressing as directed in clinic x 30 days starting 04/17/2021 WOUND #1: - Lower Leg Wound Laterality: Left, Medial Cleanser: Soap and Water 1 x Per Day/30 Days Discharge Instructions: May shower and wash wound with dial antibacterial soap and water prior to dressing change. Peri-Wound Care: Skin Prep 1 x Per Day/30 Days Discharge Instructions: Use skin prep as directed Prim Dressing: Santyl Ointment 1 x Per Day/30 Days ary Discharge Instructions: Apply nickel thick amount to wound bed as instructed Secondary Dressing: Woven Gauze Sponges 2x2 in 1 x Per Day/30 Days Discharge Instructions: Apply saline moistened gauze over Santyl Secondary Dressing: Zetuvit Plus Silicone Border Dressing 4x4 (in/in) 1 x Per Day/30 Days Discharge Instructions: Apply silicone border over primary dressing as directed. Add-Ons: AddiPak Unit Dose Saline 5 (ml) 1 x Per Day/30 Days Discharge  Instructions: to moisten gauze for dressing 1. Based on what I am seeing I think the best option is going be for [place] to initiate treatment with Santyl I did send this into the pharmacy. 2. I am also can recommend currently that we go ahead and cover this with a Zetuvit border foam dressing after applying the saline moistened gauze this was all shown to the patient today as well. 3. I did recommend she keep this covered at all times. 4. With regard to the potential for the edges of the squamous cell carcinoma not being removed I am really confused and unsure about what is going on in this regard I think that she is getting need to settle this with her provider that performed the biopsy as well as the Mohs surgeon that she is apparently been referred to. We will see patient back for reevaluation in 1 week here in the clinic. If anything worsens or changes patient will contact our office for additional recommendations. Electronic Signature(s) Signed: 04/17/2021 5:32:13 PM By: Lori Keeler PA-C Entered By: Lori [name] on 04/17/2021 17:32:13 -------------------------------------------------------------------------------- HxROS Details Patient Name: Date of Service: Lori Mt NNE B. 04/17/2021 1:15 PM Medical Record Number: [id number] Patient Account Number: [id number] Date of Birth/Sex: Treating RN: 10-Apr-1946 (75 y.o. Female) Lori [name] Primary Care Provider: Marton [name] Other Clinician: Referring Provider: Treating Provider/Extender: Lori [name] in Treatment: 0 Information Obtained From Patient Constitutional Symptoms ([place]) Complaints and Symptoms: Positive for: Fatigue - Recently Negative for: Fever; Chills; Marked Weight Change Eyes Complaints and Symptoms: Positive  for: Glasses / Contacts Negative for: Dry Eyes; Vision Changes Medical History: Positive for: Glaucoma Negative for: Cataracts; Optic Neuritis Past Medical History  Notes: Pt. stated she was once diagnosed with glaucoma Ear/Nose/Mouth/Throat Complaints and Symptoms: Positive for: Chronic sinus problems or rhinitis Review of System Notes: Chronic rhinitis-as per pt. Medical History: Negative for: Chronic sinus problems/congestion; Middle ear problems Respiratory Complaints and Symptoms: Positive for: Chronic or frequent coughs - as per pt. has a frequent cough Negative for: Shortness of Breath Medical History: Positive for: Asthma Negative for: Aspiration; Chronic Obstructive Pulmonary Disease (COPD); Pneumothorax; Sleep Apnea; Tuberculosis Cardiovascular Complaints and Symptoms: Negative for: Chest pain Medical History: Negative for: Angina; Arrhythmia; Congestive Heart Failure; Coronary Artery Disease; Deep Vein Thrombosis; Hypertension; Hypotension; Myocardial Infarction; Peripheral Arterial Disease; Peripheral Venous Disease; Phlebitis; Vasculitis Endocrine Complaints and Symptoms: Negative for: Heat/cold intolerance Medical History: Negative for: Type I Diabetes; Type II Diabetes Genitourinary Complaints and Symptoms: Positive for: Frequent urination - Had a recent UTI-as per pt. Medical History: Negative for: End Stage Renal Disease Integumentary (Skin) Complaints and Symptoms: Positive for: Wounds Review of System Notes: Wound on Left ankle Medical History: Negative for: History of Burn Past Medical History Notes: Cancer-squamous cell removed from Forehead Musculoskeletal Complaints and Symptoms: Negative for: Muscle Pain; Muscle Weakness Medical History: Positive for: Rheumatoid Arthritis Negative for: Gout; Osteoarthritis; Osteomyelitis Neurologic Complaints and Symptoms: Negative for: Numbness/parasthesias Medical History: Negative for: Dementia; Neuropathy; Quadriplegia; Paraplegia; Seizure Disorder Psychiatric Complaints and Symptoms: Negative for: Claustrophobia; Suicidal Medical History: Negative for:  Anorexia/bulimia; Confinement Anxiety Hematologic/Lymphatic Complaints and Symptoms: Review of System Notes: As per pt. has hemochromatosis Medical History: Negative for: Anemia; Hemophilia; Human Immunodeficiency Virus; Lymphedema; Sickle Cell Disease Gastrointestinal Complaints and Symptoms: Review of System Notes: Has a Hx of IBS Medical History: Past Medical History Notes: As per pt. has autoimmune hepatitis Immunological Medical History: Positive for: Lupus Erythematosus - Hx of LUPUS Oncologic Medical History: Past Medical History Notes: Cancer-Hx Squamous cell removed from forehead HBO Extended History Items Eyes: Glaucoma Immunizations Pneumococcal Vaccine: Received Pneumococcal Vaccination: Yes Received Pneumococcal Vaccination On or After 60th Birthday: No Implantable Devices None Hospitalization / Surgery History Type of Hospitalization/Surgery Lumpectomy (From R Breast) Tubal Ligation Family and Social History Never smoker; Marital Status - Married; Alcohol Use: Rarely - wine; Drug Use: No History; Caffeine Use: Rarely; Financial Concerns: No; Food, Clothing or Shelter Needs: No; Support System Lacking: No; Transportation Concerns: No Notes 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 04/17/2021 Medical Record Number: [id number] Patient Account Number: [id number] Date of Birth/Sex: Treating RN: 26-May-1946 (75 y.o. Female) Lori [name] Primary Care Provider: Marton [name] Other Clinician: Referring Provider: Treating Provider/Extender: Lori [name] in Treatment: 0 Diagnosis Coding ICD-10 Codes Code Description [phone] Squamous cell carcinoma of skin of left lower limb, including hip L97.822 Non-pressure chronic ulcer of other part of left lower leg with fat layer exposed E83.110 Hereditary hemochromatosis M32.9 Systemic lupus erythematosus, unspecified M06.89 Other specified rheumatoid arthritis, multiple sites Facility Procedures CPT4 Code: 29798921 Description: 99214 - WOUND CARE VISIT-LEV 4 EST PT Modifier: 25 Quantity: 1 CPT4 Code: [id number] Description: 14481 - DEBRIDE W/O ANES NON SELECT ICD-10 Diagnosis Description L97.822 Non-pressure chronic ulcer of other part of  left lower leg with fat layer expos Modifier: ed Quantity: 1 Physician Procedures : CPT4 Code Description Modifier [id number] 13143 - WC PHYS LEVEL 4 - NEW PT ICD-10 Diagnosis Description C44.729 Squamous cell carcinoma of skin of left lower limb, including hip L97.822 Non-pressure chronic ulcer of other part of left lower leg with fat  layer exposed E83.110 Hereditary hemochromatosis M32.9 Systemic lupus erythematosus, unspecified Quantity: 1 Electronic Signature(s) Signed: 04/17/2021 5:32:50 PM By: Lori Keeler PA-C Previous Signature: 04/17/2021 4:59:20 PM Version By: Lori [name] Entered By: Lori [name] on 04/17/2021 17:32:50

## 2021-04-23 NOTE — Progress Notes (Signed)
FREIDA, NEBEL ([id number]) Visit Report for 04/17/2021 Abuse/Suicide Risk Screen Details Patient Name: Date of Service: Lori Mt NNE B. 04/17/2021 1:15 PM Medical Record Number: [id number] Patient Account Number: [id number] Date of Birth/Sex: Treating RN: [birth year] (75 y.o. Female) Baruch Gouty Primary Care Chanc Kervin: Marton Redwood Other Clinician: Referring Blinda Turek: Treating Gazella Anglin/Extender: Jarome Matin in Treatment: 0 Abuse/Suicide Risk Screen Items Answer ABUSE RISK SCREEN: Has anyone close to you tried to hurt or harm you recentlyo No Do you feel uncomfortable with anyone in your familyo No Has anyone forced you do things that you didnt want to doo No Electronic Signature(s) Signed: 04/17/2021 5:41:23 PM By: Dellie Catholic RN Signed: 04/23/2021 5:53:22 PM By: Baruch Gouty RN, BSN Entered By: Dellie Catholic on 04/17/2021 14:32:01 -------------------------------------------------------------------------------- Activities of Daily Living Details Patient Name: Date of Service: Lori Mt NNE B. 04/17/2021 1:15 PM Medical Record Number: [id number] Patient Account Number: [id number] Date of Birth/Sex: Treating RN: [birth year] (75 y.o. Female) Baruch Gouty Primary Care Jayliah Benett: Marton Redwood Other Clinician: Referring Ethylene Reznick: Treating Samya Siciliano/Extender: Jarome Matin in Treatment: 0 Activities of Daily Living Items Answer Activities of Daily Living (Please select one for each item) Drive Automobile Completely Able T Medications ake Completely Able Use T elephone Completely Able Care for Appearance Completely Able Use T oilet Completely Able Bath / Shower Completely Able Dress Self Completely Able Feed Self Completely Able Walk Completely Able Get In / Out Bed Completely Able Housework Completely Able Prepare Meals Completely [place] Completely Able Shop for Self Completely  Able Electronic Signature(s) Signed: 04/17/2021 5:41:23 PM By: Dellie Catholic RN Signed: 04/23/2021 5:53:22 PM By: Baruch Gouty RN, BSN Entered By: Dellie Catholic on 04/17/2021 14:32:44 -------------------------------------------------------------------------------- Education Screening Details Patient Name: Date of Service: Lori Mt NNE B. 04/17/2021 1:15 PM Medical Record Number: [id number] Patient Account Number: [id number] Date of Birth/Sex: Treating RN: [birth year] (75 y.o. Female) Baruch Gouty Primary Care Harlene Petralia: Marton Redwood Other Clinician: Referring Blaire Hodsdon: Treating Judea Riches/Extender: Jarome Matin in Treatment: 0 Learning Preferences/Education Level/Primary Language Learning Preference: Explanation, Demonstration, Video, Printed Material Highest Education Level: College or Above Preferred Language: English Cognitive Barrier Language Barrier: No Translator Needed: No Memory Deficit: No Emotional Barrier: No Cultural/Religious Beliefs Affecting Medical Care: No Physical Barrier Impaired Vision: No Impaired Hearing: No Decreased Hand dexterity: No Knowledge/Comprehension Knowledge Level: High Comprehension Level: High Ability to understand written instructions: High Ability to understand verbal instructions: High Motivation Anxiety Level: Calm Cooperation: Cooperative Education Importance: Acknowledges Need Interest in Health Problems: Asks Questions Perception: Coherent Willingness to Engage in Self-Management High Activities: Readiness to Engage in Self-Management High Activities: Electronic Signature(s) Signed: 04/17/2021 5:41:23 PM By: Dellie Catholic RN Signed: 04/23/2021 5:53:22 PM By: Baruch Gouty RN, BSN Entered By: Dellie Catholic on 04/17/2021 14:33:46 -------------------------------------------------------------------------------- Fall Risk Assessment Details Patient Name: Date of Service: Lori Mt NNE B. 04/17/2021 1:15 PM Medical Record Number: [id number] Patient Account Number: [id number] Date of Birth/Sex: Treating RN: 1946-03-12 (75 y.o. Female) Baruch Gouty Primary Care Enis Riecke: Marton Redwood Other Clinician: Referring Shawny Borkowski: Treating Tito Ausmus/Extender: Jarome Matin in Treatment: 0 Fall Risk Assessment Items Have you had 2 or more falls in the last 12 monthso 0 No Have you had any fall that resulted in injury in the last 12 monthso 0 No FALLS RISK SCREEN History of falling - immediate or within 3 months 0 No Secondary diagnosis (Do you have 2 or more medical diagnoseso) 0  No Ambulatory aid None/bed rest/wheelchair/nurse 0 No Crutches/cane/walker 0 No Furniture 0 No Intravenous therapy Access/Saline/Heparin Lock 0 No Gait/Transferring Normal/ bed rest/ wheelchair 0 Yes Weak (short steps with or without shuffle, stooped but able to lift head while walking, may seek 0 No support from furniture) Impaired (short steps with shuffle, may have difficulty arising from chair, head down, impaired 0 No balance) Mental Status Oriented to own ability 0 Yes Electronic Signature(s) Signed: 04/17/2021 5:41:23 PM By: Dellie Catholic RN Signed: 04/23/2021 5:53:22 PM By: Baruch Gouty RN, BSN Entered By: Dellie Catholic on 04/17/2021 14:36:35 -------------------------------------------------------------------------------- Foot Assessment Details Patient Name: Date of Service: Lori Mt NNE B. 04/17/2021 1:15 PM Medical Record Number: [id number] Patient Account Number: [id number] Date of Birth/Sex: Treating RN: December 04, 1945 (75 y.o. Female) Baruch Gouty Primary Care Arjun Hard: Marton Redwood Other Clinician: Referring Analisse Randle: Treating Aslee Such/Extender: Jarome Matin in Treatment: 0 Foot Assessment Items Site Locations + = Sensation present, - = Sensation absent, C = Callus, U = Ulcer R = Redness, W = Warmth, M =  Maceration, PU = Pre-ulcerative lesion F = Fissure, S = Swelling, D = Dryness Assessment Right: Left: Other Deformity: No No Prior Foot Ulcer: No No Prior Amputation: No No Charcot Joint: No No Ambulatory Status: Ambulatory Without Help Gait: Steady Electronic Signature(s) Signed: 04/17/2021 5:41:23 PM By: Dellie Catholic RN Signed: 04/23/2021 5:53:22 PM By: Baruch Gouty RN, BSN Entered By: Dellie Catholic on 04/17/2021 14:39:43 -------------------------------------------------------------------------------- Nutrition Risk Screening Details Patient Name: Date of Service: Lori Mt NNE B. 04/17/2021 1:15 PM Medical Record Number: [id number] Patient Account Number: [id number] Date of Birth/Sex: Treating RN: August 11, 1945 (75 y.o. Female) Baruch Gouty Primary Care Ahlaya Ende: Marton Redwood Other Clinician: Referring Cecil Bixby: Treating Harun Brumley/Extender: Jarome Matin in Treatment: 0 Height (in): 65.5 Weight (lbs): 145 Body Mass Index (BMI): 23.8 Nutrition Risk Screening Items Score Screening NUTRITION RISK SCREEN: I have an illness or condition that made me change the kind and/or amount of food I eat 0 No I eat fewer than two meals per day 3 Yes I eat few fruits and vegetables, or milk products 0 No I have three or more drinks of beer, liquor or wine almost every day 0 No I have tooth or mouth problems that make it hard for me to eat 0 No I don't always have enough money to buy the food I need 0 No I eat alone most of the time 0 No I take three or more different prescribed or over-the-counter drugs a day 1 Yes Without wanting to, I have lost or gained 10 pounds in the last six months 0 No I am not always physically able to shop, cook and/or feed myself 0 No Nutrition Protocols Good Risk Protocol Moderate Risk Protocol High Risk Proctocol Risk Level: Moderate Risk Score: 4 Electronic Signature(s) Signed: 04/17/2021 5:41:23 PM By: Dellie Catholic RN Signed: 04/23/2021 5:53:22 PM By: Baruch Gouty RN, BSN Entered By: Dellie Catholic on 04/17/2021 14:39:11

## 2021-04-23 NOTE — Progress Notes (Signed)
HELANA, Lori [name] ([id number]) Visit Report for 04/17/2021 Allergy List Details Patient Name: Date of Service: Lori Mt NNE B. 04/17/2021 1:15 PM Medical Record Number: [id number] Patient Account Number: [id number] Date of Birth/Sex: Treating RN: 27-Jul-1945 (75 y.o. Female) Baruch Gouty Primary Care Margia Wiesen: Marton Redwood Other Clinician: Referring Keta [place]: Treating Berton Butrick/Extender: Jarome Matin in Treatment: 0 Allergies Active Allergies albumin human amoxicillin codeine Keflex Percocet shellfish derived Allergy Notes Also has allergies to: Iodinated Diagnostic agents; Oxycodone; Shellfish allergy ; Metoclopramine; Meperidine; Erthyromycin Electronic Signature(s) Signed: 04/17/2021 5:41:23 PM By: Dellie Catholic RN Entered By: Dellie Catholic on 04/17/2021 14:12:51 -------------------------------------------------------------------------------- Arrival Information Details Patient Name: Date of Service: Lori [name], [name] 04/17/2021 1:15 PM Medical Record Number: [id number] Patient Account Number: [id number] Date of Birth/Sex: Treating RN: 1945/06/27 (75 y.o. Female) Baruch Gouty Primary Care Cris Gibby: Marton Redwood Other Clinician: Referring Trixy Loyola: Treating Gila Lauf/Extender: Jarome Matin in Treatment: 0 Visit Information Patient Arrived: Ambulatory Arrival Time: 13:49 Accompanied By: self Transfer Assistance: None Patient Identification Verified: Yes Patient Requires Transmission-Based Precautions: No Patient Has Alerts: No Electronic Signature(s) Signed: 04/17/2021 5:41:23 PM By: Dellie Catholic RN Entered By: Dellie Catholic on 04/17/2021 13:57:06 -------------------------------------------------------------------------------- Clinic Level of Care Assessment Details Patient Name: Date of Service: Lori Mt NNE B. 04/17/2021 1:15 PM Medical Record Number: [id number] Patient Account Number:  [id number] Date of Birth/Sex: Treating RN: [birth year] (75 y.o. Female) [name] Primary Care Jp Eastham: Marton Redwood Other Clinician: Referring Mykia Holton: Treating Brees Hounshell/Extender: Jarome Matin in Treatment: 0 Clinic Level of Care Assessment Items TOOL 2 Quantity Score X- 1 0 Use when only an EandM is performed on the INITIAL visit ASSESSMENTS - Nursing Assessment / Reassessment X- 1 20 General Physical Exam (combine w/ comprehensive assessment (listed just below) when performed on new pt. evals) X- 1 25 Comprehensive Assessment (HX, ROS, Risk Assessments, Wounds Hx, etc.) ASSESSMENTS - Wound and Skin A ssessment / Reassessment X - Simple Wound Assessment / Reassessment - one wound 1 5 []  - 0 Complex Wound Assessment / Reassessment - multiple wounds []  - 0 Dermatologic / Skin Assessment (not related to wound area) ASSESSMENTS - Ostomy and/or Continence Assessment and Care []  - 0 Incontinence Assessment and Management []  - 0 Ostomy Care Assessment and Management (repouching, etc.) PROCESS - Coordination of Care []  - 0 Simple Patient / Family Education for ongoing care X- 1 20 Complex (extensive) Patient / Family Education for ongoing care X- 1 10 Staff obtains Programmer, systems, Records, T Results / Process Orders est []  - 0 Staff telephones HHA, Nursing Homes / Clarify orders / etc []  - 0 Routine Transfer to another Facility (non-emergent condition) []  - 0 Routine Hospital Admission (non-emergent condition) []  - 0 New Admissions / Biomedical engineer / Ordering NPWT Apligraf, etc. , []  - 0 Emergency Hospital Admission (emergent condition) []  - 0 Simple Discharge Coordination []  - 0 Complex (extensive) Discharge Coordination PROCESS - Special Needs []  - 0 Pediatric / Minor Patient Management []  - 0 Isolation Patient Management []  - 0 Hearing / Language / Visual special needs []  - 0 Assessment of Community assistance (transportation,  D/C planning, etc.) []  - 0 Additional assistance / Altered mentation []  - 0 Support Surface(s) Assessment (bed, cushion, seat, etc.) INTERVENTIONS - Wound Cleansing / Measurement X- 1 5 Wound Imaging (photographs - any number of wounds) []  - 0 Wound Tracing (instead of photographs) X- 1 5 Simple Wound Measurement - one wound []  - 0 Complex  Wound Measurement - multiple wounds X- 1 5 Simple Wound Cleansing - one wound []  - 0 Complex Wound Cleansing - multiple wounds INTERVENTIONS - Wound Dressings []  - 0 Small Wound Dressing one or multiple wounds X- 1 15 Medium Wound Dressing one or multiple wounds []  - 0 Large Wound Dressing one or multiple wounds []  - 0 Application of Medications - injection INTERVENTIONS - Miscellaneous []  - 0 External ear exam []  - 0 Specimen Collection (cultures, biopsies, blood, body fluids, etc.) []  - 0 Specimen(s) / Culture(s) sent or taken to Lab for analysis []  - 0 Patient Transfer (multiple staff / Civil Service fast streamer / Similar devices) []  - 0 Simple Staple / Suture removal (25 or less) []  - 0 Complex Staple / Suture removal (26 or more) []  - 0 Hypo / Hyperglycemic Management (close monitor of Blood Glucose) X- 1 15 Ankle / Brachial Index (ABI) - do not check if billed separately Has the patient been seen at the hospital within the last three years: Yes Total Score: 125 Level Of Care: New/Established - Level 4 Electronic Signature(s) Signed: 04/17/2021 4:59:20 PM By: [name] Entered By: [name] on 04/17/2021 15:25:05 -------------------------------------------------------------------------------- Encounter Discharge Information Details Patient Name: Date of Service: Lori [name], [name] 04/17/2021 1:15 PM Medical Record Number: [id number] Patient Account Number: [id number] Date of Birth/Sex: Treating RN: [birth year] (75 y.o. Female) [name] Primary Care Kyndall Chaplin: Marton Redwood Other Clinician: Referring Matteson Blue: Treating  Jemell Town/Extender: Jarome Matin in Treatment: 0 Encounter Discharge Information Items Post Procedure Vitals Discharge Condition: Stable Temperature (F): 99.2 Ambulatory Status: Ambulatory Pulse (bpm): 85 Discharge Destination: Home Respiratory Rate (breaths/min): 14 Transportation: Private Auto Blood Pressure (mmHg): 125/82 Schedule Follow-up Appointment: Yes Clinical Summary of Care: Provided on 04/17/2021 Form Type Recipient Paper Patient Patient Electronic Signature(s) Signed: 04/17/2021 4:59:20 PM By: [name] Entered By: [name] on 04/17/2021 15:27:30 -------------------------------------------------------------------------------- Lower Extremity Assessment Details Patient Name: Date of Service: Lori Mt NNE B. 04/17/2021 1:15 PM Medical Record Number: [id number] Patient Account Number: [id number] Date of Birth/Sex: Treating RN: 1946-05-11 (75 y.o. Female) Baruch Gouty Primary Care Pace Lamadrid: Marton Redwood Other Clinician: Referring Hollee Fate: Treating Shaquella Stamant/Extender: Jarome Matin in Treatment: 0 Edema Assessment Assessed: Shirlyn Goltz: Yes] Patrice Paradise: No] Edema: [Left: Ye] [Right: s] Calf Left: Right: Point of Measurement: 32 cm From Medial Instep 31 cm Ankle Left: Right: Point of Measurement: 9 cm From Medial Instep 18.5 cm Knee To Floor Left: Right: From Medial Instep 39 cm Vascular Assessment Pulses: Dorsalis Pedis Palpable: [Left:Yes] Blood Pressure: Brachial: [Left:125] Ankle: [Left:Dorsalis Pedis: 136 1.09] Electronic Signature(s) Signed: 04/17/2021 2:59:39 PM By: [name] Signed: 04/23/2021 5:53:22 PM By: Baruch Gouty RN, BSN Entered By: [name] on 04/17/2021 14:59:38 -------------------------------------------------------------------------------- Multi Wound Chart Details Patient Name: Date of Service: Lori [name], [name] 04/17/2021 1:15 PM Medical Record Number:  [id number] Patient Account Number: [id number] Date of Birth/Sex: Treating RN: [birth year] (75 y.o. Female) [name] Primary Care Adalay Azucena: Marton Redwood Other Clinician: Referring Chelsea Nusz: Treating Melissa Pulido/Extender: Jarome Matin in Treatment: 0 Vital Signs Height(in): 65.5 Pulse(bpm): 85 Weight(lbs): 145 Blood Pressure(mmHg): 125/82 Body Mass Index(BMI): 24 Temperature(F): 99.2 Respiratory Rate(breaths/min): 14 Photos: [1:Left, Medial Lower Leg] [N/A:N/A N/A] Wound Location: [1:Gradually Appeared] [N/A:N/A] Wounding Event: [1:Malignant Wound] [N/A:N/A] Primary Etiology: [1:Glaucoma, Asthma, Lupus] [N/A:N/A] Comorbid History: [1:Erythematosus, Rheumatoid Arthritis 03/04/2021] [N/A:N/A] Date Acquired: [1:0] [N/A:N/A] Weeks of Treatment: [1:Open] [N/A:N/A] Wound Status: [1:1x1x0.1] [N/A:N/A] Measurements L x W x D (cm) [1:0.785] [N/A:N/A]  A (cm) : rea [1:0.079] [N/A:N/A] Volume (cm) : [1:0.00%] [N/A:N/A] % Reduction in A [1:rea: 0.00%] [N/A:N/A] % Reduction in Volume: [1:Full Thickness Without Exposed] [N/A:N/A] Classification: [1:Support Structures Medium] [N/A:N/A] Exudate A mount: [1:Serosanguineous] [N/A:N/A] Exudate Type: [1:red, brown] [N/A:N/A] Exudate Color: [1:Distinct, outline attached] [N/A:N/A] Wound Margin: [1:Small (1-33%)] [N/A:N/A] Granulation A mount: [1:Pink] [N/A:N/A] Granulation Quality: [1:Large (67-100%)] [N/A:N/A] Necrotic A mount: [1:Eschar] [N/A:N/A] Necrotic Tissue: [1:Fat Layer (Subcutaneous Tissue): Yes N/A] Exposed Structures: [1:Fascia: No Tendon: No Muscle: No Joint: No Bone: No None] [N/A:N/A] Epithelialization: [1:Chemical/Enzymatic/Mechanical] [N/A:N/A] Debridement: Pre-procedure Verification/Time Out 15:19 [N/A:N/A] Taken: [1:Other] [N/A:N/A] Pain Control: [1:N/A] [N/A:N/A] Instrument: [1:None] [N/A:N/A] Bleeding: [1:Procedure was tolerated well] [N/A:N/A] Debridement Treatment Response: [1:1x1x0.1]  [N/A:N/A] Post Debridement Measurements L x W x D (cm) [1:0.079] [N/A:N/A] Post Debridement Volume: (cm) [1:Periwound erythema] [N/A:N/A] Assessment Notes: [1:Debridement] [N/A:N/A] Treatment Notes Wound #1 (Lower Leg) Wound Laterality: Left, Medial Cleanser Soap and Water Discharge Instruction: May shower and wash wound with dial antibacterial soap and water prior to dressing change. Peri-Wound Care Skin Prep Discharge Instruction: Use skin prep as directed Topical Primary Dressing Santyl Ointment Discharge Instruction: Apply nickel thick amount to wound bed as instructed Secondary Dressing Woven Gauze Sponges 2x2 in Discharge Instruction: Apply saline moistened gauze over Santyl Zetuvit Plus Silicone Border Dressing 4x4 (in/in) Discharge Instruction: Apply silicone border over primary dressing as directed. Secured With Compression Wrap Compression Stockings Add-Ons AddiPak Unit Dose Saline 5 (ml) Discharge Instruction: to moisten gauze for dressing Electronic Signature(s) Signed: 04/17/2021 4:47:50 PM By: [name] Entered By: [name] on 04/17/2021 16:47:50 -------------------------------------------------------------------------------- Multi-Disciplinary Care Plan Details Patient Name: Date of Service: Lori [name], [name] 04/17/2021 1:15 PM Medical Record Number: [id number] Patient Account Number: [id number] Date of Birth/Sex: Treating RN: Feb 15, 1946 (75 y.o. Female) [name] Primary Care Khalib Fendley: Marton Redwood Other Clinician: Referring Lathan Gieselman: Treating Daishon Chui/Extender: Jarome Matin in Treatment: 0 Active Inactive Malignancy/Atypical Etiology Nursing Diagnoses: Knowledge deficit related to disease process and management of malignancy Goals: Patient/caregiver will verbalize understanding of disease process and disease management of malignancy Date Initiated: 04/17/2021 Target Resolution Date: 05/15/2021 Goal  Status: Active Interventions: Provide education on atypical ulcer etiologies Provide education on malignant ulcerations Notes: Wound/Skin Impairment Nursing Diagnoses: Impaired tissue integrity Goals: Patient/caregiver will verbalize understanding of skin care regimen Date Initiated: 04/17/2021 Target Resolution Date: 05/15/2021 Goal Status: Active Ulcer/skin breakdown will have a volume reduction of 30% by week 4 Date Initiated: 04/17/2021 Target Resolution Date: 05/15/2021 Goal Status: Active Interventions: Assess patient/caregiver ability to obtain necessary supplies Assess patient/caregiver ability to perform ulcer/skin care regimen upon admission and as needed Assess ulceration(s) every visit Provide education on ulcer and skin care Treatment Activities: Topical wound management initiated : 04/17/2021 Notes: Electronic Signature(s) Signed: 04/17/2021 4:59:20 PM By: [name] Entered By: [name] on 04/17/2021 15:09:33 -------------------------------------------------------------------------------- Pain Assessment Details Patient Name: Date of Service: Lori Mt NNE B. 04/17/2021 1:15 PM Medical Record Number: [id number] Patient Account Number: [id number] Date of Birth/Sex: Treating RN: Jul 24, 1945 (75 y.o. Female) Baruch Gouty Primary Care Avel Ogawa: Marton Redwood Other Clinician: Referring Davyd Podgorski: Treating Nicholette Dolson/Extender: Jarome Matin in Treatment: 0 Active Problems Location of Pain Severity and Description of Pain Patient Has Paino Yes Site Locations Pain Location: Pain in Ulcers With Dressing Change: Yes Rate the pain. Current Pain Level: 5 Character of Pain Describe the Pain: Burning, Throbbing Pain Management and Medication Current Pain Management: Medication: Yes Cold Application: No Rest: Yes Massage: No Activity: No T.E.N.S.: No  Heat Application: No Leg drop or elevation: No Is the Current Pain  Management Adequate: Inadequate How does your wound impact your activities of daily livingo Sleep: No Bathing: No Appetite: No Relationship With Others: No Bladder Continence: No Emotions: No Bowel Continence: No Work: No Toileting: No Drive: No Dressing: No Hobbies: No Electronic Signature(s) Signed: 04/17/2021 5:41:23 PM By: Dellie Catholic RN Signed: 04/23/2021 5:53:22 PM By: Baruch Gouty RN, BSN Entered By: Dellie Catholic on 04/17/2021 14:42:38 -------------------------------------------------------------------------------- Patient/Caregiver Education Details Patient Name: Date of Service: Lowella Fairy 11/23/2022andnbsp1:15 PM Medical Record Number: [id number] Patient Account Number: [id number] Date of Birth/Gender: Treating RN: 08-11-1945 (75 y.o. Female) [name] Primary Care Physician: Marton Redwood Other Clinician: Referring Physician: Treating Physician/Extender: Jarome Matin in Treatment: 0 Education Assessment Education Provided To: Patient Education Topics Provided Malignant/Atypical Wounds: Methods: Explain/Verbal Responses: State content correctly Wound/Skin Impairment: Methods: Explain/Verbal, Printed Responses: State content correctly Electronic Signature(s) Signed: 04/17/2021 4:59:20 PM By: [name] Entered By: [name] on 04/17/2021 15:09:48 -------------------------------------------------------------------------------- Wound Assessment Details Patient Name: Date of Service: Lori Mt NNE B. 04/17/2021 1:15 PM Medical Record Number: [id number] Patient Account Number: [id number] Date of Birth/Sex: Treating RN: 1946/03/28 (75 y.o. Female) Baruch Gouty Primary Care Sterlin Knightly: Marton Redwood Other Clinician: Referring Marwan Lipe: Treating Garland Smouse/Extender: Jarome Matin in Treatment: 0 Wound Status Wound Number: 1 Primary Malignant Wound Etiology: Wound Location:  Left, Medial Lower Leg Wound Status: Open Wounding Event: Gradually Appeared Comorbid Glaucoma, Asthma, Lupus Erythematosus, Rheumatoid Date Acquired: 03/04/2021 History: Arthritis Weeks Of Treatment: 0 Clustered Wound: No Photos Wound Measurements Length: (cm) 1 Width: (cm) 1 Depth: (cm) 0.1 Area: (cm) 0.785 Volume: (cm) 0.079 % Reduction in Area: 0% % Reduction in Volume: 0% Epithelialization: None Tunneling: No Undermining: No Wound Description Classification: Full Thickness Without Exposed Support Structures Wound Margin: Distinct, outline attached Exudate Amount: Medium Exudate Type: Serosanguineous Exudate Color: red, brown Foul Odor After Cleansing: No Slough/Fibrino No Wound Bed Granulation Amount: Small (1-33%) Exposed Structure Granulation Quality: Pink Fascia Exposed: No Necrotic Amount: Large (67-100%) Fat Layer (Subcutaneous Tissue) Exposed: Yes Necrotic Quality: Eschar Tendon Exposed: No Muscle Exposed: No Joint Exposed: No Bone Exposed: No Assessment Notes Periwound erythema Treatment Notes Wound #1 (Lower Leg) Wound Laterality: Left, Medial Cleanser Soap and Water Discharge Instruction: May shower and wash wound with dial antibacterial soap and water prior to dressing change. Peri-Wound Care Skin Prep Discharge Instruction: Use skin prep as directed Topical Primary Dressing Santyl Ointment Discharge Instruction: Apply nickel thick amount to wound bed as instructed Secondary Dressing Woven Gauze Sponges 2x2 in Discharge Instruction: Apply saline moistened gauze over Santyl Zetuvit Plus Silicone Border Dressing 4x4 (in/in) Discharge Instruction: Apply silicone border over primary dressing as directed. Secured With Compression Wrap Compression Stockings Add-Ons AddiPak Unit Dose Saline 5 (ml) Discharge Instruction: to moisten gauze for dressing Electronic Signature(s) Signed: 04/17/2021 5:41:23 PM By: Dellie Catholic RN Signed:  04/23/2021 5:53:22 PM By: Baruch Gouty RN, BSN Entered By: Dellie Catholic on 04/17/2021 14:41:31 -------------------------------------------------------------------------------- Vitals Details Patient Name: Date of Service: Lori [name], Gregary Signs NNE B. 04/17/2021 1:15 PM Medical Record Number: [id number] Patient Account Number: [id number] Date of Birth/Sex: Treating RN: 1945-07-24 (75 y.o. Female) Baruch Gouty Primary Care Yanira Tolsma: Marton Redwood Other Clinician: Referring Cleon Thoma: Treating Arron Mcnaught/Extender: Jarome Matin in Treatment: 0 Vital Signs Time Taken: 13:58 Temperature (F): 99.2 Height (in): 65.5 Pulse (bpm): 85 Source: Stated Respiratory Rate (breaths/min): 14 Weight (lbs): 145 Blood Pressure (mmHg):  125/82 Source: Stated Reference Range: 80 - 120 mg / dl Body Mass Index (BMI): 23.8 Electronic Signature(s) Signed: 04/17/2021 5:41:23 PM By: Dellie Catholic RN Entered By: Dellie Catholic on 04/17/2021 14:02:32

## 2021-04-24 ENCOUNTER — Encounter (HOSPITAL_BASED_OUTPATIENT_CLINIC_OR_DEPARTMENT_OTHER): Payer: PPO | Admitting: Physician Assistant

## 2021-04-24 DIAGNOSIS — E039 Hypothyroidism, unspecified: Secondary | ICD-10-CM | POA: Diagnosis not present

## 2021-04-24 DIAGNOSIS — Z6823 Body mass index (BMI) 23.0-23.9, adult: Secondary | ICD-10-CM | POA: Diagnosis not present

## 2021-04-24 DIAGNOSIS — E785 Hyperlipidemia, unspecified: Secondary | ICD-10-CM | POA: Diagnosis not present

## 2021-04-24 DIAGNOSIS — M810 Age-related osteoporosis without current pathological fracture: Secondary | ICD-10-CM | POA: Diagnosis not present

## 2021-04-24 DIAGNOSIS — J310 Chronic rhinitis: Secondary | ICD-10-CM | POA: Diagnosis not present

## 2021-04-26 ENCOUNTER — Other Ambulatory Visit: Payer: Self-pay

## 2021-04-26 ENCOUNTER — Encounter (HOSPITAL_BASED_OUTPATIENT_CLINIC_OR_DEPARTMENT_OTHER): Payer: PPO | Attending: Internal Medicine | Admitting: Internal Medicine

## 2021-04-26 DIAGNOSIS — C44729 Squamous cell carcinoma of skin of left lower limb, including hip: Secondary | ICD-10-CM | POA: Diagnosis not present

## 2021-04-26 DIAGNOSIS — L97822 Non-pressure chronic ulcer of other part of left lower leg with fat layer exposed: Secondary | ICD-10-CM | POA: Diagnosis not present

## 2021-04-26 DIAGNOSIS — M329 Systemic lupus erythematosus, unspecified: Secondary | ICD-10-CM | POA: Insufficient documentation

## 2021-04-28 ENCOUNTER — Other Ambulatory Visit: Payer: Self-pay | Admitting: Cardiovascular Disease

## 2021-04-30 NOTE — Progress Notes (Signed)
ADEN, YOUNGMAN ([id number]) Visit Report for 04/26/2021 Chief Complaint Document Details Patient Name: Date of Service: Lori Mt NNE B. 04/26/2021 10:30 A M Medical Record Number: [id number] Patient Account Number: [id number] Date of Birth/Sex: Treating RN: 02-09-1946 (75 y.o. [name] Primary Care Provider: Marton Redwood Other Clinician: Referring Provider: Treating Provider/Extender: Lucky Rathke in Treatment: 1 Information Obtained from: Patient Chief Complaint Squamous cell carcinoma left leg Electronic Signature(s) Signed: 04/26/2021 12:20:00 PM By: Kalman Shan DO Entered By: Kalman Shan on 04/26/2021 12:09:44 -------------------------------------------------------------------------------- HPI Details Patient Name: Date of Service: Lori Mt NNE B. 04/26/2021 10:30 A M Medical Record Number: [id number] Patient Account Number: [id number] Date of Birth/Sex: Treating RN: May 28, 1945 (75 y.o. [name] Primary Care Provider: Marton Redwood Other Clinician: Referring Provider: Treating Provider/Extender: Lucky Rathke in Treatment: 1 History of Present Illness HPI Description: 04/17/2021 patient presents today for initial inspection here in our clinic concerning issues that she has been having with a wound on her left lower extremity. This was actually off 4 November surgically excised according to what she tells me by her physician and sent for pathology. It is very confusing whether or not the pathology report showed clear margins though the patient's been referred to the Mohs surgeon in order to ensure the "edges are clear". Again I am uncertain if this is something she insisted on or for physician solid evidence on the pathology report this was necessary either way I cannot be certain the squamous cell carcinoma is cleared at this point. Apparently that was the diagnosis. Either way this means I  will not be performing any debridement at this time. With regard to her other major medical problems she does have hereditary hemochromatosis as well as systemic lupus and rheumatoid arthritis. She also tells me that she has been using some unconventional things including "food grade peroxide, ethylene glycol (which she apparently found online as a treatment for her condition), and has been on doxycycline". 12/2; patient presents for 1 week follow-up. She was admitted at last clinic visit. She states that she is not able to obtain Santyl but has recently obtained Medihoney. She has not started the Medihoney. She has no issues or complaints today. She currently denies signs of infection. Electronic Signature(s) Signed: 04/26/2021 12:20:00 PM By: Kalman Shan DO Entered By: Kalman Shan on 04/26/2021 12:10:37 -------------------------------------------------------------------------------- Physical Exam Details Patient Name: Date of Service: Lori Mt NNE B. 04/26/2021 10:30 A M Medical Record Number: [id number] Patient Account Number: [id number] Date of Birth/Sex: Treating RN: 1946/01/27 (75 y.o. [name] Primary Care Provider: Marton Redwood Other Clinician: Referring Provider: Treating Provider/Extender: Lucky Rathke in Treatment: 1 Constitutional respirations regular, non-labored and within target range for patient.. Cardiovascular 2+ dorsalis pedis/posterior tibialis pulses. Psychiatric pleasant and cooperative. Notes Left lower extremity: T the medial distal aspect there is an open wound with nonviable tissue throughout. No signs of surrounding infection. o Electronic Signature(s) Signed: 04/26/2021 12:20:00 PM By: Kalman Shan DO Entered By: Kalman Shan on 04/26/2021 12:11:58 -------------------------------------------------------------------------------- Physician Orders Details Patient Name: Date of Service: Lori Mt  NNE B. 04/26/2021 10:30 A M Medical Record Number: [id number] Patient Account Number: [id number] Date of Birth/Sex: Treating RN: 10-Jan-1946 (75 y.o. [name] Primary Care Provider: Marton Redwood Other Clinician: Referring Provider: Treating Provider/Extender: Lucky Rathke in Treatment: 1 Verbal / Phone Orders: No Diagnosis Coding ICD-10 Coding Code Description C44.729 Squamous cell carcinoma of skin of  left lower limb, including hip L97.822 Non-pressure chronic ulcer of other part of left lower leg with fat layer exposed E83.110 Hereditary hemochromatosis M32.9 Systemic lupus erythematosus, unspecified M06.89 Other specified rheumatoid arthritis, multiple sites Follow-up Appointments ppointment in 2 weeks. - Wednesday with Margarita Grizzle Return A Other: - Out of network with all wound care suppliers, unable to order supplies. **Patient to order 4x4 Silicone Bordered Foam** Bathing/ Shower/ Hygiene May shower and wash wound with soap and water. - Use antibacterial soap when changing dressing. Edema Control - Lymphedema / SCD / Other Avoid standing for long periods of time. Moisturize legs daily. Additional Orders / Instructions Follow Nutritious Diet - High Protein Diet, 100-120g daily Other: - Follow up at [facility] #1 - Lower Leg Wound Laterality: Left, Medial Cleanser: Soap and Water 1 x Per Day/30 Days Discharge Instructions: May shower and wash wound with dial antibacterial soap and water prior to dressing change. Peri-Wound Care: Skin Prep 1 x Per Day/30 Days Discharge Instructions: Use skin prep as directed Prim Dressing: MediHoney Gel, tube 1.5 (oz) 1 x Per Day/30 Days ary Discharge Instructions: Apply to wound bed as instructed Secondary Dressing: Woven Gauze Sponges 2x2 in 1 x Per Day/30 Days Discharge Instructions: Apply saline moistened gauze over Santyl Secondary Dressing: Zetuvit Plus Silicone Border Dressing 4x4  (in/in) 1 x Per Day/30 Days Discharge Instructions: Apply silicone border over primary dressing as directed. Add-Ons: AddiPak Unit Dose Saline 5 (ml) 1 x Per Day/30 Days Discharge Instructions: to moisten gauze for dressing Electronic Signature(s) Signed: 04/26/2021 12:20:00 PM By: Kalman Shan DO Previous Signature: 04/26/2021 11:53:13 AM Version By: [name] Entered By: Kalman Shan on 04/26/2021 12:12:33 -------------------------------------------------------------------------------- Problem List Details Patient Name: Date of Service: Lori Mt NNE B. 04/26/2021 10:30 A M Medical Record Number: [id number] Patient Account Number: [id number] Date of Birth/Sex: Treating RN: 1945/08/20 (75 y.o. [name] Primary Care Provider: Marton Redwood Other Clinician: Referring Provider: Treating Provider/Extender: Lucky Rathke in Treatment: 1 Active Problems ICD-10 Encounter Code Description Active Date MDM Diagnosis C44.729 Squamous cell carcinoma of skin of left lower limb, including hip 04/17/2021 No Yes L97.822 Non-pressure chronic ulcer of other part of left lower leg with fat layer exposed 04/17/2021 No Yes E83.110 Hereditary hemochromatosis 04/17/2021 No Yes M32.9 Systemic lupus erythematosus, unspecified 04/17/2021 No Yes M06.89 Other specified rheumatoid arthritis, multiple sites 04/17/2021 No Yes Inactive Problems Resolved Problems Electronic Signature(s) Signed: 04/26/2021 12:20:00 PM By: Kalman Shan DO Previous Signature: 04/26/2021 11:53:13 AM Version By: [name] Entered By: Kalman Shan on 04/26/2021 12:09:29 -------------------------------------------------------------------------------- Progress Note Details Patient Name: Date of Service: Lori Mt NNE B. 04/26/2021 10:30 A M Medical Record Number: [id number] Patient Account Number: [id number] Date of Birth/Sex: Treating RN: 06-30-1945 (75 y.o. [name] Primary Care Provider: Marton Redwood Other Clinician: Referring Provider: Treating Provider/Extender: Lucky Rathke in Treatment: 1 Subjective Chief Complaint Information obtained from Patient Squamous cell carcinoma left leg History of Present Illness (HPI) 04/17/2021 patient presents today for initial inspection here in our clinic concerning issues that she has been having with a wound on her left lower extremity. This was actually off 4 November surgically excised according to what she tells me by her physician and sent for pathology. It is very confusing whether or not the pathology report showed clear margins though the patient's been referred to the Mohs surgeon in order to ensure the "edges are clear". Again I am uncertain if this is  something she insisted on or for physician solid evidence on the pathology report this was necessary either way I cannot be certain the squamous cell carcinoma is cleared at this point. Apparently that was the diagnosis. Either way this means I will not be performing any debridement at this time. With regard to her other major medical problems she does have hereditary hemochromatosis as well as systemic lupus and rheumatoid arthritis. She also tells me that she has been using some unconventional things including "food grade peroxide, ethylene glycol (which she apparently found online as a treatment for her condition), and has been on doxycycline". 12/2; patient presents for 1 week follow-up. She was admitted at last clinic visit. She states that she is not able to obtain Santyl but has recently obtained Medihoney. She has not started the Medihoney. She has no issues or complaints today. She currently denies signs of infection. Patient History Information obtained from Patient. Social History Never smoker, Marital Status - Married, Alcohol Use - Rarely - wine, Drug Use - No History, Caffeine Use - Rarely. Medical  History Eyes Patient has history of Glaucoma Denies history of Cataracts, Optic Neuritis Ear/Nose/Mouth/Throat Denies history of Chronic sinus problems/congestion, Middle ear problems Hematologic/Lymphatic Denies history of Anemia, Hemophilia, Human Immunodeficiency Virus, Lymphedema, Sickle Cell Disease Respiratory Patient has history of Asthma Denies history of Aspiration, Chronic Obstructive Pulmonary Disease (COPD), Pneumothorax, Sleep Apnea, Tuberculosis Cardiovascular Denies history of Angina, Arrhythmia, Congestive Heart Failure, Coronary Artery Disease, Deep Vein Thrombosis, Hypertension, Hypotension, Myocardial Infarction, Peripheral Arterial Disease, Peripheral Venous Disease, Phlebitis, Vasculitis Endocrine Denies history of Type I Diabetes, Type II Diabetes Genitourinary Denies history of End Stage Renal Disease Immunological Patient has history of Lupus Erythematosus - Hx of LUPUS Integumentary (Skin) Denies history of History of Burn Musculoskeletal Patient has history of Rheumatoid Arthritis Denies history of Gout, Osteoarthritis, Osteomyelitis Neurologic Denies history of Dementia, Neuropathy, Quadriplegia, Paraplegia, Seizure Disorder Psychiatric Denies history of Anorexia/bulimia, Confinement Anxiety Hospitalization/Surgery History - Lumpectomy (From R Breast). - Tubal Ligation. Medical A Surgical History Notes nd Eyes Pt. stated she was once diagnosed with glaucoma Gastrointestinal As per pt. has autoimmune hepatitis Integumentary (Skin) Cancer-squamous cell removed from Forehead Oncologic Cancer-Hx Squamous cell removed from forehead Objective Constitutional respirations regular, non-labored and within target range for patient.. Vitals Time Taken: 10:45 AM, Height: 65.5 in, Weight: 145 lbs, BMI: 23.8, Temperature: 97.7 F, Pulse: 79 bpm, Respiratory Rate: 16 breaths/min, Blood Pressure: 101/68 mmHg. Cardiovascular 2+ dorsalis pedis/posterior tibialis  pulses. Psychiatric pleasant and cooperative. General Notes: Left lower extremity: T the medial distal aspect there is an open wound with nonviable tissue throughout. No signs of surrounding infection. o Integumentary (Hair, Skin) Wound #1 status is Open. Original cause of wound was Gradually Appeared. The date acquired was: 03/04/2021. The wound has been in treatment 1 weeks. The wound is located on the Left,Medial Lower Leg. The wound measures 1.2cm length x 0.9cm width x 0.1cm depth; 0.848cm^2 area and 0.085cm^3 volume. There is Fat Layer (Subcutaneous Tissue) exposed. There is no tunneling or undermining noted. There is a medium amount of serosanguineous drainage noted. The wound margin is distinct with the outline attached to the wound base. There is small (1-33%) pink granulation within the wound bed. There is a large (67- 100%) amount of necrotic tissue within the wound bed including Eschar. Assessment Active Problems ICD-10 Squamous cell carcinoma of skin of left lower limb, including hip Non-pressure chronic ulcer of other part of left lower leg with fat layer exposed Hereditary hemochromatosis Systemic  lupus erythematosus, unspecified Other specified rheumatoid arthritis, multiple sites Patient was admitted at last clinic visit by [facility]. She has a history of squamous cell carcinoma of the left lower extremity. She states that the previous removal site has not healed. She has recently obtained Medihoney and I recommended she use this daily. Follow-up in 1 week. Plan Follow-up Appointments: Return Appointment in 2 weeks. - Wednesday with Margarita Grizzle Other: - Out of network with all wound care suppliers, unable to order supplies. **Patient to order 4x4 Silicone Bordered Foam** Bathing/ Shower/ Hygiene: May shower and wash wound with soap and water. - Use antibacterial soap when changing dressing. Edema Control - Lymphedema / SCD / Other: Avoid standing for long periods of time. Moisturize  legs daily. Additional Orders / Instructions: Follow Nutritious Diet - High Protein Diet, 100-120g daily Other: - Follow up at [facility] #1: - Lower Leg Wound Laterality: Left, Medial Cleanser: Soap and Water 1 x Per Day/30 Days Discharge Instructions: May shower and wash wound with dial antibacterial soap and water prior to dressing change. Peri-Wound Care: Skin Prep 1 x Per Day/30 Days Discharge Instructions: Use skin prep as directed Prim Dressing: MediHoney Gel, tube 1.5 (oz) 1 x Per Day/30 Days ary Discharge Instructions: Apply to wound bed as instructed Secondary Dressing: Woven Gauze Sponges 2x2 in 1 x Per Day/30 Days Discharge Instructions: Apply saline moistened gauze over Santyl Secondary Dressing: Zetuvit Plus Silicone Border Dressing 4x4 (in/in) 1 x Per Day/30 Days Discharge Instructions: Apply silicone border over primary dressing as directed. Add-Ons: AddiPak Unit Dose Saline 5 (ml) 1 x Per Day/30 Days Discharge Instructions: to moisten gauze for dressing 1. Medihoney 2. Follow-up in 2 weeks Electronic Signature(s) Signed: 04/26/2021 12:20:00 PM By: Kalman Shan DO Entered By: Kalman Shan on 04/26/2021 12:19:19 -------------------------------------------------------------------------------- HxROS Details Patient Name: Date of Service: Lori Mt NNE B. 04/26/2021 10:30 A M Medical Record Number: [id number] Patient Account Number: [id number] Date of Birth/Sex: Treating RN: 1945/10/10 (75 y.o. [name] Primary Care Provider: Marton Redwood Other Clinician: Referring Provider: Treating Provider/Extender: Lucky Rathke in Treatment: 1 Information Obtained From Patient Eyes Medical History: Positive for: Glaucoma Negative for: Cataracts; Optic Neuritis Past Medical History Notes: Pt. stated she was once diagnosed with glaucoma Ear/Nose/Mouth/Throat Medical History: Negative for: Chronic sinus  problems/congestion; Middle ear problems Hematologic/Lymphatic Medical History: Negative for: Anemia; Hemophilia; Human Immunodeficiency Virus; Lymphedema; Sickle Cell Disease Respiratory Medical History: Positive for: Asthma Negative for: Aspiration; Chronic Obstructive Pulmonary Disease (COPD); Pneumothorax; Sleep Apnea; Tuberculosis Cardiovascular Medical History: Negative for: Angina; Arrhythmia; Congestive Heart Failure; Coronary Artery Disease; Deep Vein Thrombosis; Hypertension; Hypotension; Myocardial Infarction; Peripheral Arterial Disease; Peripheral Venous Disease; Phlebitis; Vasculitis Gastrointestinal Medical History: Past Medical History Notes: As per pt. has autoimmune hepatitis Endocrine Medical History: Negative for: Type I Diabetes; Type II Diabetes Genitourinary Medical History: Negative for: End Stage Renal Disease Immunological Medical History: Positive for: Lupus Erythematosus - Hx of LUPUS Integumentary (Skin) Medical History: Negative for: History of Burn Past Medical History Notes: Cancer-squamous cell removed from Forehead Musculoskeletal Medical History: Positive for: Rheumatoid Arthritis Negative for: Gout; Osteoarthritis; Osteomyelitis Neurologic Medical History: Negative for: Dementia; Neuropathy; Quadriplegia; Paraplegia; Seizure Disorder Oncologic Medical History: Past Medical History Notes: Cancer-Hx Squamous cell removed from forehead Psychiatric Medical History: Negative for: Anorexia/bulimia; Confinement Anxiety HBO Extended History Items Eyes: Glaucoma Immunizations Pneumococcal Vaccine: Received Pneumococcal Vaccination: Yes Received Pneumococcal Vaccination On or After 60th Birthday: No Implantable Devices None Hospitalization / Surgery History Type of Hospitalization/Surgery Lumpectomy (  From R Breast) Tubal Ligation Family and Social History Never smoker; Marital Status - Married; Alcohol Use: Rarely - wine; Drug Use:  No History; Caffeine Use: Rarely; Financial Concerns: No; Food, Clothing or Shelter Needs: No; Support System Lacking: No; Transportation Concerns: No Electronic Signature(s) Signed: 04/26/2021 12:20:00 PM By: Kalman Shan DO Signed: 04/30/2021 4:29:15 PM By: Baruch Gouty RN, BSN Entered By: Kalman Shan on 04/26/2021 12:11:02 -------------------------------------------------------------------------------- [place] Details Patient Name: Date of Service: Lori Mt NNE B. 04/26/2021 Medical Record Number: [id number] Patient Account Number: [id number] Date of Birth/Sex: Treating RN: [birth year] (75 y.o. [name] Primary Care Provider: Marton Redwood Other Clinician: Referring Provider: Treating Provider/Extender: Lucky Rathke in Treatment: 1 Diagnosis Coding ICD-10 Codes Code Description [phone] Squamous cell carcinoma of skin of left lower limb, including hip L97.822 Non-pressure chronic ulcer of other part of left lower leg with fat layer exposed E83.110 Hereditary hemochromatosis M32.9 Systemic lupus erythematosus, unspecified M06.89 Other specified rheumatoid arthritis, multiple sites Facility Procedures CPT4 Code: [id number] Description: [phone] - WOUND CARE VISIT-LEV 2 EST PT Modifier: Quantity: 1 Physician Procedures : CPT4 Code Description Modifier [id number] - WC PHYS LEVEL 3 - EST PT ICD-10 Diagnosis Description C44.729 Squamous cell carcinoma of skin of left lower limb, including hip L97.822 Non-pressure chronic ulcer of other part of left lower leg with fat  layer exposed M32.9 Systemic lupus erythematosus, unspecified E83.110 Hereditary hemochromatosis Quantity: 1 Electronic Signature(s) Signed: 04/26/2021 12:20:00 PM By: Kalman Shan DO Previous Signature: 04/26/2021 11:41:45 AM Version By: [name] Entered By: Kalman Shan on 04/26/2021 12:19:41

## 2021-04-30 NOTE — Progress Notes (Signed)
Lori [name], Lori [name] ([id number]) Visit Report for 04/26/2021 Arrival Information Details Patient Name: Date of Service: Lori Mt NNE B. 04/26/2021 10:30 A M Medical Record Number: [id number] Patient Account Number: [id number] Date of Birth/Sex: Treating RN: 27-Dec-1945 (75 y.o. [name] Primary Care Treyce Spillers: Marton Redwood Other Clinician: Referring Oluwatobi Ruppe: Treating Chao Blazejewski/Extender: Lucky Rathke in Treatment: 1 Visit Information History Since Last Visit Added or deleted any medications: No Patient Arrived: Ambulatory Any new allergies or adverse reactions: No Arrival Time: 10:51 Had a fall or experienced change in No Transfer Assistance: None activities of daily living that may affect Patient Identification Verified: Yes risk of falls: Secondary Verification Process Completed: Yes Signs or symptoms of abuse/neglect since last visito No Patient Requires Transmission-Based Precautions: No Hospitalized since last visit: No Patient Has Alerts: No Implantable device outside of the clinic excluding No cellular tissue based products placed in the center since last visit: Has Dressing in Place as Prescribed: Yes Pain Present Now: No Electronic Signature(s) Signed: 04/26/2021 11:53:13 AM By: [name] Entered By: [name] on 04/26/2021 10:52:11 -------------------------------------------------------------------------------- Clinic Level of Care Assessment Details Patient Name: Date of Service: Lori Mt NNE B. 04/26/2021 10:30 A M Medical Record Number: [id number] Patient Account Number: [id number] Date of Birth/Sex: Treating RN: 03/30/1946 (75 y.o. [name] Primary Care Ammi Hutt: Marton Redwood Other Clinician: Referring Parissa Chiao: Treating Coti Burd/Extender: Lucky Rathke in Treatment: 1 Clinic Level of Care Assessment Items TOOL 4 Quantity Score X- 1 0 Use when only an EandM is performed on  FOLLOW-UP visit ASSESSMENTS - Nursing Assessment / Reassessment X- 1 10 Reassessment of Co-morbidities (includes updates in patient status) X- 1 5 Reassessment of Adherence to Treatment Plan ASSESSMENTS - Wound and Skin A ssessment / Reassessment X - Simple Wound Assessment / Reassessment - one wound 1 5 []  - 0 Complex Wound Assessment / Reassessment - multiple wounds []  - 0 Dermatologic / Skin Assessment (not related to wound area) ASSESSMENTS - Focused Assessment []  - 0 Circumferential Edema Measurements - multi extremities []  - 0 Nutritional Assessment / Counseling / Intervention []  - 0 Lower Extremity Assessment (monofilament, tuning fork, pulses) []  - 0 Peripheral Arterial Disease Assessment (using hand held doppler) ASSESSMENTS - Ostomy and/or Continence Assessment and Care []  - 0 Incontinence Assessment and Management []  - 0 Ostomy Care Assessment and Management (repouching, etc.) PROCESS - Coordination of Care []  - 0 Simple Patient / Family Education for ongoing care X- 1 20 Complex (extensive) Patient / Family Education for ongoing care []  - 0 Staff obtains Programmer, systems, Records, T Results / Process Orders est []  - 0 Staff telephones HHA, Nursing Homes / Clarify orders / etc []  - 0 Routine Transfer to another Facility (non-emergent condition) []  - 0 Routine Hospital Admission (non-emergent condition) []  - 0 New Admissions / Biomedical engineer / Ordering NPWT Apligraf, etc. , []  - 0 Emergency Hospital Admission (emergent condition) []  - 0 Simple Discharge Coordination []  - 0 Complex (extensive) Discharge Coordination PROCESS - Special Needs []  - 0 Pediatric / Minor Patient Management []  - 0 Isolation Patient Management []  - 0 Hearing / Language / Visual special needs []  - 0 Assessment of Community assistance (transportation, D/C planning, etc.) []  - 0 Additional assistance / Altered mentation []  - 0 Support Surface(s) Assessment (bed, cushion,  seat, etc.) INTERVENTIONS - Wound Cleansing / Measurement X - Simple Wound Cleansing - one wound 1 5 []  - 0 Complex Wound Cleansing - multiple wounds X- 1  5 Wound Imaging (photographs - any number of wounds) []  - 0 Wound Tracing (instead of photographs) X- 1 5 Simple Wound Measurement - one wound []  - 0 Complex Wound Measurement - multiple wounds INTERVENTIONS - Wound Dressings []  - 0 Small Wound Dressing one or multiple wounds X- 1 15 Medium Wound Dressing one or multiple wounds []  - 0 Large Wound Dressing one or multiple wounds []  - 0 Application of Medications - topical []  - 0 Application of Medications - injection INTERVENTIONS - Miscellaneous []  - 0 External ear exam []  - 0 Specimen Collection (cultures, biopsies, blood, body fluids, etc.) []  - 0 Specimen(s) / Culture(s) sent or taken to Lab for analysis []  - 0 Patient Transfer (multiple staff / Civil Service fast streamer / Similar devices) []  - 0 Simple Staple / Suture removal (25 or less) []  - 0 Complex Staple / Suture removal (26 or more) []  - 0 Hypo / Hyperglycemic Management (close monitor of Blood Glucose) []  - 0 Ankle / Brachial Index (ABI) - do not check if billed separately X- 1 5 Vital Signs Has the patient been seen at the hospital within the last three years: Yes Total Score: 75 Level Of Care: New/Established - Level 2 Electronic Signature(s) Signed: 04/26/2021 11:53:13 AM By: [name] Entered By: [name] on 04/26/2021 11:41:34 -------------------------------------------------------------------------------- Lower Extremity Assessment Details Patient Name: Date of Service: Lori Mt NNE B. 04/26/2021 10:30 A M Medical Record Number: [id number] Patient Account Number: [id number] Date of Birth/Sex: Treating RN: 09-04-1945 (75 y.o. [name] Primary Care Brieanne Mignone: Marton Redwood Other Clinician: Referring Sheniah Supak: Treating Anber Mckiver/Extender: Lucky Rathke in  Treatment: 1 Edema Assessment Assessed: Shirlyn Goltz: Yes] Patrice Paradise: No] Edema: [Left: Ye] [Right: s] Calf Left: Right: Point of Measurement: 32 cm From Medial Instep 32.4 cm Ankle Left: Right: Point of Measurement: 9 cm From Medial Instep 18.5 cm Electronic Signature(s) Signed: 04/26/2021 11:53:13 AM By: [name] Entered By: [name] on 04/26/2021 10:48:44 -------------------------------------------------------------------------------- Multi Wound Chart Details Patient Name: Date of Service: Lori Mt NNE B. 04/26/2021 10:30 A M Medical Record Number: [id number] Patient Account Number: [id number] Date of Birth/Sex: Treating RN: 1945-08-12 (75 y.o. Martyn Malay, Linda Primary Care Azar South: Marton Redwood Other Clinician: Referring Jshaun Abernathy: Treating Quaron Delacruz/Extender: Lucky Rathke in Treatment: 1 Vital Signs Height(in): 65.5 Pulse(bpm): 79 Weight(lbs): 145 Blood Pressure(mmHg): 101/68 Body Mass Index(BMI): 24 Temperature(F): 97.7 Respiratory Rate(breaths/min): 16 Photos: [1:Left, Medial Lower Leg] [N/A:N/A N/A] Wound Location: [1:Gradually Appeared] [N/A:N/A] Wounding Event: [1:Malignant Wound] [N/A:N/A] Primary Etiology: [1:Glaucoma, Asthma, Lupus] [N/A:N/A] Comorbid History: [1:Erythematosus, Rheumatoid Arthritis 03/04/2021] [N/A:N/A] Date Acquired: [1:1] [N/A:N/A] Weeks of Treatment: [1:Open] [N/A:N/A] Wound Status: [1:1.2x0.9x0.1] [N/A:N/A] Measurements L x W x D (cm) [1:0.848] [N/A:N/A] A (cm) : rea [1:0.085] [N/A:N/A] Volume (cm) : [1:-8.00%] [N/A:N/A] % Reduction in Area: [1:-7.60%] [N/A:N/A] % Reduction in Volume: [1:Full Thickness Without Exposed] [N/A:N/A] Classification: [1:Support Structures Medium] [N/A:N/A] Exudate A mount: [1:Serosanguineous] [N/A:N/A] Exudate Type: [1:red, brown] [N/A:N/A] Exudate Color: [1:Distinct, outline attached] [N/A:N/A] Wound Margin: [1:Small (1-33%)] [N/A:N/A] Granulation Amount: [1:Pink]  [N/A:N/A] Granulation Quality: [1:Large (67-100%)] [N/A:N/A] Necrotic Amount: [1:Eschar] [N/A:N/A] Necrotic Tissue: [1:Fat Layer (Subcutaneous Tissue): Yes N/A] Exposed Structures: [1:Fascia: No Tendon: No Muscle: No Joint: No Bone: No None] [N/A:N/A] Treatment Notes Electronic Signature(s) Signed: 04/26/2021 12:20:00 PM By: Kalman Shan DO Signed: 04/30/2021 4:29:15 PM By: Baruch Gouty RN, BSN Entered By: Kalman Shan on 04/26/2021 12:09:34 -------------------------------------------------------------------------------- [place] Details Patient Name: Date of Service: Lori [name], [name] 04/26/2021 10:30 A  M Medical Record Number: [id number] Patient Account Number: [id number] Date of Birth/Sex: Treating RN: 1945-12-12 (75 y.o. [name] Primary Care Laloni Rowton: Marton Redwood Other Clinician: Referring Jemell Town: Treating Mitchell Epling/Extender: Lucky Rathke in Treatment: 1 Active Inactive Malignancy/Atypical Etiology Nursing Diagnoses: Knowledge deficit related to disease process and management of malignancy Goals: Patient/caregiver will verbalize understanding of disease process and disease management of malignancy Date Initiated: 04/17/2021 Target Resolution Date: 05/15/2021 Goal Status: Active Interventions: Provide education on atypical ulcer etiologies Provide education on malignant ulcerations Notes: Wound/Skin Impairment Nursing Diagnoses: Impaired tissue integrity Goals: Patient/caregiver will verbalize understanding of skin care regimen Date Initiated: 04/17/2021 Target Resolution Date: 05/15/2021 Goal Status: Active Ulcer/skin breakdown will have a volume reduction of 30% by week 4 Date Initiated: 04/17/2021 Target Resolution Date: 05/15/2021 Goal Status: Active Interventions: Assess patient/caregiver ability to obtain necessary supplies Assess patient/caregiver ability to perform ulcer/skin care  regimen upon admission and as needed Assess ulceration(s) every visit Provide education on ulcer and skin care Treatment Activities: Topical wound management initiated : 04/17/2021 Notes: Electronic Signature(s) Signed: 04/26/2021 11:53:13 AM By: [name] Entered By: [name] on 04/26/2021 10:52:52 -------------------------------------------------------------------------------- Pain Assessment Details Patient Name: Date of Service: Lori Mt NNE B. 04/26/2021 10:30 A M Medical Record Number: [id number] Patient Account Number: [id number] Date of Birth/Sex: Treating RN: 08/08/1945 (75 y.o. [name] Primary Care Akacia Boltz: Marton Redwood Other Clinician: Referring Vernia Teem: Treating Sharlett Lienemann/Extender: Lucky Rathke in Treatment: 1 Active Problems Location of Pain Severity and Description of Pain Patient Has Paino No Site Locations Pain Management and Medication Current Pain Management: Electronic Signature(s) Signed: 04/26/2021 11:53:13 AM By: [name] Entered By: [name] on 04/26/2021 10:46:20 -------------------------------------------------------------------------------- Patient/Caregiver Education Details Patient Name: Date of Service: Lori Mt NNE B. 12/2/2022andnbsp10:30 [place] Record Number: [id number] Patient Account Number: [id number] Date of Birth/Gender: Treating RN: Feb 16, 1946 (75 y.o. [name] Primary Care Physician: Marton Redwood Other Clinician: Referring Physician: Treating Physician/Extender: Lucky Rathke in Treatment: 1 Education Assessment Education Provided To: Patient Education Topics Provided Malignant/Atypical Wounds: Methods: Explain/Verbal Responses: State content correctly Wound/Skin Impairment: Methods: Explain/Verbal, Printed Responses: State content correctly Electronic Signature(s) Signed: 04/26/2021 11:53:13 AM By: [name] Entered By: [name] on 04/26/2021 10:53:15 -------------------------------------------------------------------------------- Wound Assessment Details Patient Name: Date of Service: Lori Mt NNE B. 04/26/2021 10:30 A M Medical Record Number: [id number] Patient Account Number: [id number] Date of Birth/Sex: Treating RN: February 07, 1946 (75 y.o. Martyn Malay, Linda Primary Care Jaidev Sanger: Marton Redwood Other Clinician: Referring Hugh Kamara: Treating Afrah Burlison/Extender: Lucky Rathke in Treatment: 1 Wound Status Wound Number: 1 Primary Malignant Wound Etiology: Wound Location: Left, Medial Lower Leg Wound Status: Open Wounding Event: Gradually Appeared Comorbid Glaucoma, Asthma, Lupus Erythematosus, Rheumatoid Date Acquired: 03/04/2021 History: Arthritis Weeks Of Treatment: 1 Clustered Wound: No Photos Wound Measurements Length: (cm) 1.2 Width: (cm) 0.9 Depth: (cm) 0.1 Area: (cm) 0.848 Volume: (cm) 0.085 % Reduction in Area: -8% % Reduction in Volume: -7.6% Epithelialization: None Tunneling: No Undermining: No Wound Description Classification: Full Thickness Without Exposed Support Structures Wound Margin: Distinct, outline attached Exudate Amount: Medium Exudate Type: Serosanguineous Exudate Color: red, brown Foul Odor After Cleansing: No Slough/Fibrino No Wound Bed Granulation Amount: Small (1-33%) Exposed Structure Granulation Quality: Pink Fascia Exposed: No Necrotic Amount: Large (67-100%) Fat Layer (Subcutaneous Tissue) Exposed: Yes Necrotic Quality: Eschar Tendon Exposed: No Muscle Exposed: No Joint Exposed: No Bone Exposed: No Electronic Signature(s) Signed: 04/30/2021 4:29:15 PM By: Baruch Gouty RN,  BSN Signed: 04/30/2021 5:02:41 PM By: Dellie Catholic RN Entered By: Dellie Catholic on 04/26/2021 10:52:50 -------------------------------------------------------------------------------- Vitals Details Patient Name: Date  of Service: Lori [name], Gregary Signs NNE B. 04/26/2021 10:30 A M Medical Record Number: [id number] Patient Account Number: [id number] Date of Birth/Sex: Treating RN: 1946-02-25 (75 y.o. [name] Primary Care Crescent [place]: Marton Redwood Other Clinician: Referring Thaddeus Evitts: Treating Sandrika Schwinn/Extender: Lucky Rathke in Treatment: 1 Vital Signs Time Taken: 10:45 Temperature (F): 97.7 Height (in): 65.5 Pulse (bpm): 79 Weight (lbs): 145 Respiratory Rate (breaths/min): 16 Body Mass Index (BMI): 23.8 Blood Pressure (mmHg): 101/68 Reference Range: 80 - 120 mg / dl Electronic Signature(s) Signed: 04/26/2021 11:53:13 AM By: [name] Entered By: [name] on 04/26/2021 10:46:00

## 2021-05-02 ENCOUNTER — Other Ambulatory Visit: Payer: Self-pay

## 2021-05-02 ENCOUNTER — Inpatient Hospital Stay: Payer: PPO

## 2021-05-02 ENCOUNTER — Inpatient Hospital Stay: Payer: PPO | Attending: Hematology & Oncology

## 2021-05-02 LAB — CBC WITH DIFFERENTIAL (CANCER CENTER ONLY)
Abs Immature Granulocytes: 0.15 10*3/uL — ABNORMAL HIGH (ref 0.00–0.07)
Basophils Absolute: 0 10*3/uL (ref 0.0–0.1)
Basophils Relative: 0 %
Eosinophils Absolute: 0.1 10*3/uL (ref 0.0–0.5)
Eosinophils Relative: 1 %
HCT: 40.7 % (ref 36.0–46.0)
Hemoglobin: 13.5 g/dL (ref 12.0–15.0)
Immature Granulocytes: 1 %
Lymphocytes Relative: 21 %
Lymphs Abs: 2.3 10*3/uL (ref 0.7–4.0)
MCH: 31.5 pg (ref 26.0–34.0)
MCHC: 33.2 g/dL (ref 30.0–36.0)
MCV: 94.9 fL (ref 80.0–100.0)
Monocytes Absolute: 0.8 10*3/uL (ref 0.1–1.0)
Monocytes Relative: 7 %
Neutro Abs: 7.7 10*3/uL (ref 1.7–7.7)
Neutrophils Relative %: 70 %
Platelet Count: 195 10*3/uL (ref 150–400)
RBC: 4.29 MIL/uL (ref 3.87–5.11)
RDW: 12.2 % (ref 11.5–15.5)
WBC Count: 11 10*3/uL — ABNORMAL HIGH (ref 4.0–10.5)
nRBC: 0 % (ref 0.0–0.2)

## 2021-05-02 LAB — CMP (CANCER CENTER ONLY)
ALT: 18 U/L (ref 0–44)
AST: 25 U/L (ref 15–41)
Albumin: 4.3 g/dL (ref 3.5–5.0)
Alkaline Phosphatase: 68 U/L (ref 38–126)
Anion gap: 7 (ref 5–15)
BUN: 16 mg/dL (ref 8–23)
CO2: 29 mmol/L (ref 22–32)
Calcium: 9.8 mg/dL (ref 8.9–10.3)
Chloride: 102 mmol/L (ref 98–111)
Creatinine: 0.84 mg/dL (ref 0.44–1.00)
GFR, Estimated: >60 mL/min (ref >60)
Glucose, Bld: 84 mg/dL (ref 70–99)
Potassium: 3.8 mmol/L (ref 3.5–5.1)
Sodium: 138 mmol/L (ref 135–145)
Total Bilirubin: 0.5 mg/dL (ref 0.3–1.2)
Total Protein: 6.4 g/dL — ABNORMAL LOW (ref 6.5–8.1)

## 2021-05-02 LAB — RETICULOCYTES
Immature Retic Fract: 7.2 % (ref 2.3–15.9)
RBC.: 4.24 MIL/uL (ref 3.87–5.11)
Retic Count, Absolute: 56 10*3/uL (ref 19.0–186.0)
Retic Ct Pct: 1.3 % (ref 0.4–3.1)

## 2021-05-02 MED ORDER — SODIUM CHLORIDE 0.9% FLUSH
10.0000 mL | Freq: Once | INTRAVENOUS | Status: AC
  Administered 2021-05-02: 10 mL via INTRAVENOUS

## 2021-05-02 MED ORDER — HEPARIN SOD (PORK) LOCK FLUSH 100 UNIT/ML IV SOLN
500.0000 [IU] | Freq: Once | INTRAVENOUS | Status: AC
  Administered 2021-05-02: 500 [IU] via INTRAVENOUS

## 2021-05-02 NOTE — Progress Notes (Signed)
Per Dr. Marin Olp only take 250 grams with phlebotomy today based on CBC as Iron studies won't result until tomorrow. Pt agreeable to plan and had no further questions.   Lori [name] presents today for phlebotomy per MD orders. Phlebotomy procedure started at 1415 and ended at 1427. 330 grams removed via 19 gauge port to right chest. Patient declined to stay for 30 minute post observation period stating she has tolerated procedure multiple times in the past.  Patient tolerated procedure well. IV needle removed intact. Pt given drink but pt declined any snacks.

## 2021-05-02 NOTE — Patient Instructions (Signed)

## 2021-05-02 NOTE — Patient Instructions (Signed)
Therapeutic Phlebotomy °Therapeutic phlebotomy is the planned removal of blood from a person's body for the purpose of treating a medical condition. The procedure is lot like donating blood. Usually, about a pint (470 mL, or 0.47 L) of blood is removed. The average adult has 9-12 pints (4.3-5.7 L) of blood in his or her body. °Therapeutic phlebotomy may be used to treat the following medical conditions: °Hemochromatosis. This is a condition in which the blood contains too much iron. °Polycythemia vera. This is a condition in which the blood contains too many red blood cells. °Porphyria cutanea tarda. This is a disease in which an important part of hemoglobin is not made properly. It results in the buildup of abnormal amounts of porphyrins in the body. °Sickle cell disease. This is a condition in which the red blood cells form an abnormal crescent shape rather than a round shape. °Tell a health care provider about: °Any allergies you have. °All medicines you are taking, including vitamins, herbs, eye drops, creams, and over-the-counter medicines. °Any bleeding problems you have. °Any surgeries you have had. °Any medical conditions you have. °Whether you are pregnant or may be pregnant. °What are the risks? °Generally, this is a safe procedure. However, problems may occur, including: °Nausea or light-headedness. °Low blood pressure (hypotension). °Soreness, bleeding, swelling, or bruising at the needle insertion site. °Infection. °What happens before the procedure? °Ask your health care provider about: °Changing or stopping your regular medicines. This is especially important if you are taking diabetes medicines or blood thinners. °Taking medicines such as aspirin and ibuprofen. These medicines can thin your blood. Do not take these medicines unless your health care provider tells you to take them. °Taking over-the-counter medicines, vitamins, herbs, and supplements. °Wear clothing with sleeves that can be raised  above the elbow. °You may have a blood sample taken. °Your blood pressure, pulse rate, and breathing rate will be measured. °What happens during the procedure? ° °You may be given a medicine to numb the area (local anesthetic). °A tourniquet will be placed on your arm. °A needle will be put into one of your veins. °Tubing and a collection bag will be attached to the needle. °Blood will flow through the needle and tubing into the collection bag. °The collection bag will be placed lower than your arm so gravity can help the blood flow into the bag. °You may be asked to open and close your hand slowly and continually during the entire collection. °After the specified amount of blood has been removed from your body, the collection bag and tubing will be clamped. °The needle will be removed from your vein. °Pressure will be held on the needle site to stop the bleeding. °A bandage (dressing) will be placed over the needle insertion site. °The procedure may vary among health care providers and hospitals. °What happens after the procedure? °Your blood pressure, pulse rate, and breathing rate will be measured after the procedure. °You will be encouraged to drink fluids. °You will be encouraged to eat a snack to prevent a low blood sugar level. °Your recovery will be assessed and monitored. °Return to your normal activities as told by your health care provider. °Summary °Therapeutic phlebotomy is the planned removal of blood from a person's body for the purpose of treating a medical condition. °Therapeutic phlebotomy may be used to treat hemochromatosis, polycythemia vera, porphyria cutanea tarda, or sickle cell disease. °In the procedure, a needle is inserted and about a pint (470 mL, or 0.47 L) of blood is   removed. The average adult has 9-12 pints (4.3-5.7 L) of blood in the body. °This is generally a safe procedure, but it can sometimes cause problems such as nausea, light-headedness, or low blood pressure  (hypotension). °This information is not intended to replace advice given to you by your health care provider. Make sure you discuss any questions you have with your health care provider. °Document Revised: 11/07/2020 Document Reviewed: 11/07/2020 °Elsevier Patient Education © 2022 [employer] ° °

## 2021-05-03 ENCOUNTER — Telehealth: Payer: Self-pay | Admitting: Hematology & Oncology

## 2021-05-03 ENCOUNTER — Telehealth: Payer: Self-pay | Admitting: *Deleted

## 2021-05-03 LAB — IRON AND TIBC
Iron: 114 ug/dL (ref 41–142)
Saturation Ratios: 45 % (ref 21–57)
TIBC: 256 ug/dL (ref 236–444)
UIBC: 142 ug/dL (ref 120–384)

## 2021-05-03 LAB — FERRITIN: Ferritin: 22 ng/mL (ref 11–307)

## 2021-05-03 NOTE — Telephone Encounter (Signed)
-----   Message from Volanda Napoleon, MD sent at 05/03/2021 11:11 AM EST ----- Please call and tell her that the iron levels were borderline.  I am glad that we are able to phlebotomize her.

## 2021-05-03 NOTE — Telephone Encounter (Signed)
Patient notified per order of Dr. Marin Olp "that the iron levels were borderline.  I am glad that we were able to phlebotomize her."  Pt appreciative of call and requests that a copy of her labs from 05/02/21 be mailed to her.  Labs mailed per pt.'s request.

## 2021-05-06 ENCOUNTER — Encounter: Payer: Self-pay | Admitting: Hematology & Oncology

## 2021-05-06 NOTE — Telephone Encounter (Signed)
Called to schedule, left voicemail

## 2021-05-08 ENCOUNTER — Encounter (HOSPITAL_BASED_OUTPATIENT_CLINIC_OR_DEPARTMENT_OTHER): Payer: PPO | Admitting: Physician Assistant

## 2021-05-08 ENCOUNTER — Other Ambulatory Visit: Payer: Self-pay

## 2021-05-08 DIAGNOSIS — M329 Systemic lupus erythematosus, unspecified: Secondary | ICD-10-CM | POA: Diagnosis not present

## 2021-05-08 DIAGNOSIS — L97822 Non-pressure chronic ulcer of other part of left lower leg with fat layer exposed: Secondary | ICD-10-CM | POA: Diagnosis not present

## 2021-05-08 DIAGNOSIS — C44729 Squamous cell carcinoma of skin of left lower limb, including hip: Secondary | ICD-10-CM | POA: Diagnosis not present

## 2021-05-08 NOTE — Progress Notes (Signed)
Lori [name], Lori [name] ([id number]) Visit Report for 05/08/2021 Arrival Information Details Patient Name: Date of Service: Lori Mt NNE B. 05/08/2021 1:30 PM Medical Record Number: [id number] Patient Account Number: [id number] Date of Birth/Sex: Treating RN: [birth year] (75 y.o. Martyn Malay, Linda Primary Care Demyan Fugate: Marton Redwood Other Clinician: Referring Aune Adami: Treating Adela Esteban/Extender: Jarome Matin in Treatment: 3 Visit Information History Since Last Visit Added or deleted any medications: No Patient Arrived: Ambulatory Any new allergies or adverse reactions: No Arrival Time: 14:25 Had a fall or experienced change in No Accompanied By: self activities of daily living that may affect Transfer Assistance: None risk of falls: Patient Identification Verified: Yes Signs or symptoms of abuse/neglect since last visito No Secondary Verification Process Completed: Yes Hospitalized since last visit: No Patient Requires Transmission-Based Precautions: No Implantable device outside of the clinic excluding No Patient Has Alerts: No cellular tissue based products placed in the center since last visit: Has Dressing in Place as Prescribed: Yes Pain Present Now: No Electronic Signature(s) Signed: 05/08/2021 3:58:17 PM By: Sandre Kitty Entered By: Sandre Kitty on 05/08/2021 14:25:56 -------------------------------------------------------------------------------- Clinic Level of Care Assessment Details Patient Name: Date of Service: Lori Mt NNE B. 05/08/2021 1:30 PM Medical Record Number: [id number] Patient Account Number: [id number] Date of Birth/Sex: Treating RN: 11-Mar-1946 (75 y.o. [name], Meta.Reding Primary Care Lin Hackmann: Marton Redwood Other Clinician: Referring Jamin Humphries: Treating Lakya Schrupp/Extender: Jarome Matin in Treatment: 3 Clinic Level of Care Assessment Items TOOL 4 Quantity Score X- 1 0 Use when only an  EandM is performed on FOLLOW-UP visit ASSESSMENTS - Nursing Assessment / Reassessment X- 1 10 Reassessment of Co-morbidities (includes updates in patient status) X- 1 5 Reassessment of Adherence to Treatment Plan ASSESSMENTS - Wound and Skin A ssessment / Reassessment X - Simple Wound Assessment / Reassessment - one wound 1 5 []  - 0 Complex Wound Assessment / Reassessment - multiple wounds X- 1 10 Dermatologic / Skin Assessment (not related to wound area) ASSESSMENTS - Focused Assessment X- 1 5 Circumferential Edema Measurements - multi extremities X- 1 10 Nutritional Assessment / Counseling / Intervention []  - 0 Lower Extremity Assessment (monofilament, tuning fork, pulses) []  - 0 Peripheral Arterial Disease Assessment (using hand held doppler) ASSESSMENTS - Ostomy and/or Continence Assessment and Care []  - 0 Incontinence Assessment and Management []  - 0 Ostomy Care Assessment and Management (repouching, etc.) PROCESS - Coordination of Care X - Simple Patient / Family Education for ongoing care 1 15 []  - 0 Complex (extensive) Patient / Family Education for ongoing care X- 1 10 Staff obtains Consents, Records, T Results / Process Orders est []  - 0 Staff telephones HHA, Nursing Homes / Clarify orders / etc []  - 0 Routine Transfer to another Facility (non-emergent condition) []  - 0 Routine Hospital Admission (non-emergent condition) []  - 0 New Admissions / Biomedical engineer / Ordering NPWT Apligraf, etc. , []  - 0 Emergency Hospital Admission (emergent condition) X- 1 10 Simple Discharge Coordination []  - 0 Complex (extensive) Discharge Coordination PROCESS - Special Needs []  - 0 Pediatric / Minor Patient Management []  - 0 Isolation Patient Management []  - 0 Hearing / Language / Visual special needs []  - 0 Assessment of Community assistance (transportation, D/C planning, etc.) []  - 0 Additional assistance / Altered mentation []  - 0 Support Surface(s)  Assessment (bed, cushion, seat, etc.) INTERVENTIONS - Wound Cleansing / Measurement X - Simple Wound Cleansing - one wound 1 5 []  - 0 Complex Wound Cleansing -  multiple wounds X- 1 5 Wound Imaging (photographs - any number of wounds) []  - 0 Wound Tracing (instead of photographs) X- 1 5 Simple Wound Measurement - one wound []  - 0 Complex Wound Measurement - multiple wounds INTERVENTIONS - Wound Dressings X - Small Wound Dressing one or multiple wounds 1 10 []  - 0 Medium Wound Dressing one or multiple wounds []  - 0 Large Wound Dressing one or multiple wounds []  - 0 Application of Medications - topical []  - 0 Application of Medications - injection INTERVENTIONS - Miscellaneous []  - 0 External ear exam []  - 0 Specimen Collection (cultures, biopsies, blood, body fluids, etc.) []  - 0 Specimen(s) / Culture(s) sent or taken to Lab for analysis []  - 0 Patient Transfer (multiple staff / Civil Service fast streamer / Similar devices) []  - 0 Simple Staple / Suture removal (25 or less) []  - 0 Complex Staple / Suture removal (26 or more) []  - 0 Hypo / Hyperglycemic Management (close monitor of Blood Glucose) []  - 0 Ankle / Brachial Index (ABI) - do not check if billed separately X- 1 5 Vital Signs Has the patient been seen at the hospital within the last three years: Yes Total Score: 110 Level Of Care: New/Established - Level 3 Electronic Signature(s) Signed: 05/08/2021 5:27:53 PM By: Deon Pilling RN, BSN Entered By: Deon Pilling on 05/08/2021 14:51:47 -------------------------------------------------------------------------------- Encounter Discharge Information Details Patient Name: Date of Service: Lori [name], [name] 05/08/2021 1:30 PM Medical Record Number: [id number] Patient Account Number: [id number] Date of Birth/Sex: Treating RN: 09-28-1945 (75 y.o. [name], Tammi Klippel Primary Care Zevin Nevares: Marton Redwood Other Clinician: Referring Esau Fridman: Treating Kennedee Kitzmiller/Extender: Jarome Matin in Treatment: 3 Encounter Discharge Information Items Discharge Condition: Stable Ambulatory Status: Ambulatory Discharge Destination: Home Transportation: Private Auto Accompanied By: self Schedule Follow-up Appointment: Yes Clinical Summary of Care: Electronic Signature(s) Signed: 05/08/2021 5:27:53 PM By: Deon Pilling RN, BSN Entered By: Deon Pilling on 05/08/2021 14:52:17 -------------------------------------------------------------------------------- Susan Moore Details Patient Name: Date of Service: Lori [name], [name] 05/08/2021 1:30 PM Medical Record Number: [id number] Patient Account Number: [id number] Date of Birth/Sex: Treating RN: 08/30/1945 (75 y.o. [name], Meta.Reding Primary Care Janne Faulk: Marton Redwood Other Clinician: Referring Adolph Clutter: Treating Ravin Bendall/Extender: Jarome Matin in Treatment: 3 Active Inactive Malignancy/Atypical Etiology Nursing Diagnoses: Knowledge deficit related to disease process and management of malignancy Goals: Patient/caregiver will verbalize understanding of disease process and disease management of malignancy Date Initiated: 04/17/2021 Target Resolution Date: 05/15/2021 Goal Status: Active Interventions: Provide education on atypical ulcer etiologies Provide education on malignant ulcerations Notes: Wound/Skin Impairment Nursing Diagnoses: Impaired tissue integrity Goals: Patient/caregiver will verbalize understanding of skin care regimen Date Initiated: 04/17/2021 Target Resolution Date: 05/15/2021 Goal Status: Active Ulcer/skin breakdown will have a volume reduction of 30% by week 4 Date Initiated: 04/17/2021 Target Resolution Date: 05/15/2021 Goal Status: Active Interventions: Assess patient/caregiver ability to obtain necessary supplies Assess patient/caregiver ability to perform ulcer/skin care regimen upon admission and as needed Assess ulceration(s)  every visit Provide education on ulcer and skin care Treatment Activities: Topical wound management initiated : 04/17/2021 Notes: Electronic Signature(s) Signed: 05/08/2021 5:27:53 PM By: Deon Pilling RN, BSN Entered By: Deon Pilling on 05/08/2021 14:47:30 -------------------------------------------------------------------------------- Pain Assessment Details Patient Name: Date of Service: Lori Mt NNE B. 05/08/2021 1:30 PM Medical Record Number: [id number] Patient Account Number: [id number] Date of Birth/Sex: Treating RN: Dec 25, 1945 (75 y.o. [name] Primary Care Trevian Hayashida: Marton Redwood Other Clinician: Referring Ellese Julius: Treating Emran Molzahn/Extender: Joaquim Lai  III, Miles Costain Weeks in Treatment: 3 Active Problems Location of Pain Severity and Description of Pain Patient Has Paino No Site Locations Pain Management and Medication Current Pain Management: Electronic Signature(s) Signed: 05/08/2021 3:58:17 PM By: Sandre Kitty Signed: 05/08/2021 4:24:09 PM By: Baruch Gouty RN, BSN Entered By: Sandre Kitty on 05/08/2021 14:26:21 -------------------------------------------------------------------------------- Patient/Caregiver Education Details Patient Name: Date of Service: Lori [name] 12/14/2022andnbsp1:30 PM Medical Record Number: [id number] Patient Account Number: [id number] Date of Birth/Gender: Treating RN: 19-Oct-1945 (75 y.o. Debby Bud Primary Care Physician: Marton Redwood Other Clinician: Referring Physician: Treating Physician/Extender: Jarome Matin in Treatment: 3 Education Assessment Education Provided To: Patient Education Topics Provided Wound/Skin Impairment: Handouts: Skin Care Do's and Dont's Methods: Explain/Verbal Responses: Reinforcements needed Electronic Signature(s) Signed: 05/08/2021 5:27:53 PM By: Deon Pilling RN, BSN Entered By: Deon Pilling on 05/08/2021  14:47:45 -------------------------------------------------------------------------------- Wound Assessment Details Patient Name: Date of Service: Lori Mt NNE B. 05/08/2021 1:30 PM Medical Record Number: [id number] Patient Account Number: [id number] Date of Birth/Sex: Treating RN: Sep 29, 1945 (75 y.o. [name], Meta.Reding Primary Care Anahit Klumb: Marton Redwood Other Clinician: Referring Katalyn Matin: Treating Mackenzey Crownover/Extender: Jarome Matin in Treatment: 3 Wound Status Wound Number: 1 Primary Malignant Wound Etiology: Wound Location: Left, Medial Lower Leg Wound Status: Open Wounding Event: Gradually Appeared Comorbid Glaucoma, Asthma, Lupus Erythematosus, Rheumatoid Date Acquired: 03/04/2021 History: Arthritis Weeks Of Treatment: 3 Clustered Wound: No Wound Measurements Length: (cm) 0.9 Width: (cm) 0.5 Depth: (cm) 0.1 Area: (cm) 0.353 Volume: (cm) 0.035 % Reduction in Area: 55% % Reduction in Volume: 55.7% Epithelialization: Medium (34-66%) Tunneling: No Undermining: No Wound Description Classification: Full Thickness Without Exposed Support Structures Wound Margin: Distinct, outline attached Exudate Amount: Medium Exudate Type: Serosanguineous Exudate Color: red, brown Foul Odor After Cleansing: No Slough/Fibrino No Wound Bed Granulation Amount: Small (1-33%) Exposed Structure Granulation Quality: Pink Fascia Exposed: No Necrotic Amount: None Present (0%) Fat Layer (Subcutaneous Tissue) Exposed: Yes Tendon Exposed: No Muscle Exposed: No Joint Exposed: No Bone Exposed: No Treatment Notes Wound #1 (Lower Leg) Wound Laterality: Left, Medial Cleanser Soap and Water Discharge Instruction: May shower and wash wound with dial antibacterial soap and water prior to dressing change. Peri-Wound Care Skin Prep Discharge Instruction: Use skin prep as directed Topical Primary Dressing MediHoney Gel, tube 1.5 (oz) Discharge Instruction: Apply to  wound bed as instructed Secondary Dressing Zetuvit Plus Silicone Border Dressing 4x4 (in/in) Discharge Instruction: Apply silicone border over primary dressing as directed. Secured With Compression Wrap Compression Stockings Environmental education officer) Signed: 05/08/2021 5:27:53 PM By: Deon Pilling RN, BSN Entered By: Deon Pilling on 05/08/2021 14:43:01 -------------------------------------------------------------------------------- Vitals Details Patient Name: Date of Service: Lori [name], [name] 05/08/2021 1:30 PM Medical Record Number: [id number] Patient Account Number: [id number] Date of Birth/Sex: Treating RN: [birth year] (75 y.o. [name] Primary Care Danniella Robben: Marton Redwood Other Clinician: Referring Javeria Briski: Treating Halil Rentz/Extender: Jarome Matin in Treatment: 3 Vital Signs Time Taken: 14:26 Temperature (F): 98.8 Height (in): 65.5 Pulse (bpm): 80 Weight (lbs): 145 Respiratory Rate (breaths/min): 16 Body Mass Index (BMI): 23.8 Blood Pressure (mmHg): 116/75 Reference Range: 80 - 120 mg / dl Electronic Signature(s) Signed: 05/08/2021 3:58:17 PM By: Sandre Kitty Signed: 05/08/2021 3:58:17 PM By: Sandre Kitty Entered By: Sandre Kitty on 05/08/2021 14:26:16

## 2021-05-08 NOTE — Progress Notes (Addendum)
DEMIANA, [name] ([id number]) Visit Report for 05/08/2021 Chief Complaint Document Details Patient Name: Date of Service: Lori [name]. 05/08/2021 1:30 PM Medical Record Number: [id number] Patient Account Number: [id number] Date of Birth/Sex: Treating RN: 05/21/1946 (75 y.o. Lori [name] Primary Care Provider: Marton Redwood Other Clinician: Referring Provider: Treating Provider/Extender: Jarome Matin in Treatment: 3 Information Obtained from: Patient Chief Complaint Squamous cell carcinoma left leg Electronic Signature(s) Signed: 05/08/2021 2:43:15 PM By: Worthy Keeler PA-C Entered By: Worthy Keeler on 05/08/2021 14:43:15 -------------------------------------------------------------------------------- HPI Details Patient Name: Date of Service: Lori [name]. 05/08/2021 1:30 PM Medical Record Number: [id number] Patient Account Number: [id number] Date of Birth/Sex: Treating RN: Jan 03, 1946 (75 y.o. Lori [name] Primary Care Provider: Marton Redwood Other Clinician: Referring Provider: Treating Provider/Extender: Jarome Matin in Treatment: 3 History of Present Illness HPI Description: 04/17/2021 patient presents today for initial inspection here in our clinic concerning issues that she has been having with a wound on her left lower extremity. This was actually off 4 November surgically excised according to what she tells me by her physician and sent for pathology. It is very confusing whether or not the pathology report showed clear margins though the patient's been referred to the Mohs surgeon in order to ensure the "edges are clear". Again I am uncertain if this is something she insisted on or for physician solid evidence on the pathology report this was necessary either way I cannot be certain the squamous cell carcinoma is cleared at this point. Apparently that was the diagnosis. Either way this means I will  not be performing any debridement at this time. With regard to her other major medical problems she does have hereditary hemochromatosis as well as systemic lupus and rheumatoid arthritis. She also tells me that she has been using some unconventional things including "food grade peroxide, ethylene glycol (which she apparently found online as a treatment for her condition), and has been on doxycycline". 12/2; patient presents for 1 week follow-up. She was admitted at last clinic visit. She states that she is not able to obtain Santyl but has recently obtained Medihoney. She has not started the Medihoney. She has no issues or complaints today. She currently denies signs of infection. 05/08/2021 upon evaluation patient appears to actually be doing quite well in regard to her wound all things considered. She has been using Medihoney and to be honest Medihoney seems to be doing awesome. I do not see any signs of active infection locally nor systemically at this point. No fevers, chills, nausea, vomiting, or diarrhea. Electronic Signature(s) Signed: 05/08/2021 2:55:43 PM By: Worthy Keeler PA-C Entered By: Worthy Keeler on 05/08/2021 14:55:43 -------------------------------------------------------------------------------- Physical Exam Details Patient Name: Date of Service: Lori [name]. 05/08/2021 1:30 PM Medical Record Number: [id number] Patient Account Number: [id number] Date of Birth/Sex: Treating RN: 1946-01-21 (75 y.o. Lori [name] Primary Care Provider: Marton Redwood Other Clinician: Referring Provider: Treating Provider/Extender: Jarome Matin in Treatment: 3 Constitutional Well-nourished and well-hydrated in no acute distress. Respiratory normal breathing without difficulty. Psychiatric this patient is able to make decisions and demonstrates good insight into disease process. Alert and Oriented x 3. pleasant and cooperative. Notes Upon inspection  patient's wound bed actually showed signs of being almost completely healed to be perfectly honest. She still has not seen the Mohs surgeon and again I am not sure what they are to have to do  or not as never seen the pathology report for confirmation purposes even though we tried to get this. Electronic Signature(s) Signed: 05/08/2021 2:56:00 PM By: Worthy Keeler PA-C Entered By: Worthy Keeler on 05/08/2021 14:56:00 -------------------------------------------------------------------------------- Physician Orders Details Patient Name: Date of Service: Lori [name]. 05/08/2021 1:30 PM Medical Record Number: [id number] Patient Account Number: [id number] Date of Birth/Sex: Treating RN: [birth year] (75 y.o. Lori [name], Meta.Reding Primary Care Provider: Marton Redwood Other Clinician: Referring Provider: Treating Provider/Extender: Jarome Matin in Treatment: 3 Verbal / Phone Orders: No Diagnosis Coding ICD-10 Coding Code Description [phone] Squamous cell carcinoma of skin of left lower limb, including hip L97.822 Non-pressure chronic ulcer of other part of left lower leg with fat layer exposed E83.110 Hereditary hemochromatosis M32.9 Systemic lupus erythematosus, unspecified M06.89 Other specified rheumatoid arthritis, multiple sites Follow-up Appointments Return appointment in 3 weeks. - Wednesday with Margarita Grizzle Other: - Out of network with all wound care suppliers, unable to order supplies. **Patient to order 4x4 Silicone Bordered Foam** Bathing/ Shower/ Hygiene May shower and wash wound with soap and water. - Use antibacterial soap when changing dressing. Edema Control - Lymphedema / SCD / Other Avoid standing for long periods of time. Moisturize legs daily. Additional Orders / Instructions Follow Nutritious Diet - High Protein Diet, 100-120g daily Wound Treatment Wound #1 - Lower Leg Wound Laterality: Left, Medial Cleanser: Soap and Water 1 x Per Day/30  Days Discharge Instructions: May shower and wash wound with dial antibacterial soap and water prior to dressing change. Peri-Wound Care: Skin Prep 1 x Per Day/30 Days Discharge Instructions: Use skin prep as directed Prim Dressing: MediHoney Gel, tube 1.5 (oz) 1 x Per Day/30 Days ary Discharge Instructions: Apply to wound bed as instructed Secondary Dressing: Zetuvit Plus Silicone Border Dressing 4x4 (in/in) 1 x Per Day/30 Days Discharge Instructions: Apply silicone border over primary dressing as directed. Electronic Signature(s) Signed: 05/08/2021 3:33:06 PM By: Worthy Keeler PA-C Signed: 05/08/2021 5:27:53 PM By: Deon Pilling RN, BSN Entered By: Deon Pilling on 05/08/2021 14:51:19 -------------------------------------------------------------------------------- Problem List Details Patient Name: Date of Service: [name], Gregary Signs NNE [name]. 05/08/2021 1:30 PM Medical Record Number: [id number] Patient Account Number: [id number] Date of Birth/Sex: Treating RN: 07-27-1945 (75 y.o. Lori [name] Primary Care Provider: Marton Redwood Other Clinician: Referring Provider: Treating Provider/Extender: Jarome Matin in Treatment: 3 Active Problems ICD-10 Encounter Code Description Active Date MDM Diagnosis C44.729 Squamous cell carcinoma of skin of left lower limb, including hip 04/17/2021 No Yes L97.822 Non-pressure chronic ulcer of other part of left lower leg with fat layer exposed 04/17/2021 No Yes E83.110 Hereditary hemochromatosis 04/17/2021 No Yes M32.9 Systemic lupus erythematosus, unspecified 04/17/2021 No Yes M06.89 Other specified rheumatoid arthritis, multiple sites 04/17/2021 No Yes Inactive Problems Resolved Problems Electronic Signature(s) Signed: 05/08/2021 2:43:01 PM By: Worthy Keeler PA-C Entered By: Worthy Keeler on 05/08/2021 14:43:00 -------------------------------------------------------------------------------- Progress Note  Details Patient Name: Date of Service: Lori [name]. 05/08/2021 1:30 PM Medical Record Number: [id number] Patient Account Number: [id number] Date of Birth/Sex: Treating RN: 09/30/1945 (75 y.o. Lori [name] Primary Care Provider: Marton Redwood Other Clinician: Referring Provider: Treating Provider/Extender: Jarome Matin in Treatment: 3 Subjective Chief Complaint Information obtained from Patient Squamous cell carcinoma left leg History of Present Illness (HPI) 04/17/2021 patient presents today for initial inspection here in our clinic concerning issues that she has been having with a wound on her left lower extremity.  This was actually off 4 November surgically excised according to what she tells me by her physician and sent for pathology. It is very confusing whether or not the pathology report showed clear margins though the patient's been referred to the Mohs surgeon in order to ensure the "edges are clear". Again I am uncertain if this is something she insisted on or for physician solid evidence on the pathology report this was necessary either way I cannot be certain the squamous cell carcinoma is cleared at this point. Apparently that was the diagnosis. Either way this means I will not be performing any debridement at this time. With regard to her other major medical problems she does have hereditary hemochromatosis as well as systemic lupus and rheumatoid arthritis. She also tells me that she has been using some unconventional things including "food grade peroxide, ethylene glycol (which she apparently found online as a treatment for her condition), and has been on doxycycline". 12/2; patient presents for 1 week follow-up. She was admitted at last clinic visit. She states that she is not able to obtain Santyl but has recently obtained Medihoney. She has not started the Medihoney. She has no issues or complaints today. She currently denies signs of  infection. 05/08/2021 upon evaluation patient appears to actually be doing quite well in regard to her wound all things considered. She has been using Medihoney and to be honest Medihoney seems to be doing awesome. I do not see any signs of active infection locally nor systemically at this point. No fevers, chills, nausea, vomiting, or diarrhea. Objective Constitutional Well-nourished and well-hydrated in no acute distress. Vitals Time Taken: 2:26 PM, Height: 65.5 in, Weight: 145 lbs, BMI: 23.8, Temperature: 98.8 F, Pulse: 80 bpm, Respiratory Rate: 16 breaths/min, Blood Pressure: 116/75 mmHg. Respiratory normal breathing without difficulty. Psychiatric this patient is able to make decisions and demonstrates good insight into disease process. Alert and Oriented x 3. pleasant and cooperative. General Notes: Upon inspection patient's wound bed actually showed signs of being almost completely healed to be perfectly honest. She still has not seen the Mohs surgeon and again I am not sure what they are to have to do or not as never seen the pathology report for confirmation purposes even though we tried to get this. Integumentary (Hair, Skin) Wound #1 status is Open. Original cause of wound was Gradually Appeared. The date acquired was: 03/04/2021. The wound has been in treatment 3 weeks. The wound is located on the Left,Medial Lower Leg. The wound measures 0.9cm length x 0.5cm width x 0.1cm depth; 0.353cm^2 area and 0.035cm^3 volume. There is Fat Layer (Subcutaneous Tissue) exposed. There is no tunneling or undermining noted. There is a medium amount of serosanguineous drainage noted. The wound margin is distinct with the outline attached to the wound base. There is small (1-33%) pink granulation within the wound bed. There is no necrotic tissue within the wound bed. Assessment Active Problems ICD-10 Squamous cell carcinoma of skin of left lower limb, including hip Non-pressure chronic ulcer of  other part of left lower leg with fat layer exposed Hereditary hemochromatosis Systemic lupus erythematosus, unspecified Other specified rheumatoid arthritis, multiple sites Plan Follow-up Appointments: Return appointment in 3 weeks. - Wednesday with Margarita Grizzle Other: - Out of network with all wound care suppliers, unable to order supplies. **Patient to order 4x4 Silicone Bordered Foam** Bathing/ Shower/ Hygiene: May shower and wash wound with soap and water. - Use antibacterial soap when changing dressing. Edema Control - Lymphedema / SCD / Other:  Avoid standing for long periods of time. Moisturize legs daily. Additional Orders / Instructions: Follow Nutritious Diet - High Protein Diet, 100-120g daily WOUND #1: - Lower Leg Wound Laterality: Left, Medial Cleanser: Soap and Water 1 x Per Day/30 Days Discharge Instructions: May shower and wash wound with dial antibacterial soap and water prior to dressing change. Peri-Wound Care: Skin Prep 1 x Per Day/30 Days Discharge Instructions: Use skin prep as directed Prim Dressing: MediHoney Gel, tube 1.5 (oz) 1 x Per Day/30 Days ary Discharge Instructions: Apply to wound bed as instructed Secondary Dressing: Zetuvit Plus Silicone Border Dressing 4x4 (in/in) 1 x Per Day/30 Days Discharge Instructions: Apply silicone border over primary dressing as directed. 1. I would recommend currently that we go ahead and have the patient continue with the wound care measures as before she is in agreement with that plan. This includes the use of the Medihoney she was not able to ever get the [facility] but the good news is the Medihoney has done a great job. 2. I am also can recommend we continue with the Zetuvit border foam dressing to cover. We will see patient back for reevaluation in 3 weeks here in the clinic. If anything worsens or changes patient will contact our office for additional recommendations. If she is completely healed then she may not even need to come  for that appointment she can call and cancel or even if she is seeing the Mohs surgeon that week she probably does not need to come here and there. She is just not sure when her appointment is. Electronic Signature(s) Signed: 05/08/2021 2:56:42 PM By: Worthy Keeler PA-C Entered By: Worthy Keeler on 05/08/2021 14:56:41 -------------------------------------------------------------------------------- SuperBill Details Patient Name: Date of Service: Lori [name]. 05/08/2021 Medical Record Number: [id number] Patient Account Number: [id number] Date of Birth/Sex: Treating RN: 03-23-1946 (75 y.o. Lori [name], Meta.Reding Primary Care Provider: Marton Redwood Other Clinician: Referring Provider: Treating Provider/Extender: Jarome Matin in Treatment: 3 Diagnosis Coding ICD-10 Codes Code Description [phone] Squamous cell carcinoma of skin of left lower limb, including hip L97.822 Non-pressure chronic ulcer of other part of left lower leg with fat layer exposed E83.110 Hereditary hemochromatosis M32.9 Systemic lupus erythematosus, unspecified M06.89 Other specified rheumatoid arthritis, multiple sites Facility Procedures Physician Procedures : CPT4 Code Description Modifier [id number] - WC PHYS LEVEL 4 - EST PT ICD-10 Diagnosis Description C44.729 Squamous cell carcinoma of skin of left lower limb, including hip L97.822 Non-pressure chronic ulcer of other part of left lower leg with fat  layer exposed E83.110 Hereditary hemochromatosis M32.9 Systemic lupus erythematosus, unspecified Quantity: 1 Electronic Signature(s) Signed: 05/08/2021 2:59:20 PM By: Worthy Keeler PA-C Entered By: Worthy Keeler on 05/08/2021 14:59:20

## 2021-05-09 ENCOUNTER — Encounter (HOSPITAL_BASED_OUTPATIENT_CLINIC_OR_DEPARTMENT_OTHER): Payer: PPO | Admitting: Internal Medicine

## 2021-05-29 ENCOUNTER — Encounter (HOSPITAL_BASED_OUTPATIENT_CLINIC_OR_DEPARTMENT_OTHER): Payer: PPO | Attending: Physician Assistant | Admitting: Physician Assistant

## 2021-05-29 ENCOUNTER — Other Ambulatory Visit: Payer: Self-pay

## 2021-05-29 DIAGNOSIS — M329 Systemic lupus erythematosus, unspecified: Secondary | ICD-10-CM | POA: Diagnosis not present

## 2021-05-29 DIAGNOSIS — C44729 Squamous cell carcinoma of skin of left lower limb, including hip: Secondary | ICD-10-CM | POA: Diagnosis not present

## 2021-05-29 DIAGNOSIS — Z85828 Personal history of other malignant neoplasm of skin: Secondary | ICD-10-CM | POA: Insufficient documentation

## 2021-05-29 DIAGNOSIS — L97822 Non-pressure chronic ulcer of other part of left lower leg with fat layer exposed: Secondary | ICD-10-CM | POA: Insufficient documentation

## 2021-05-29 NOTE — Progress Notes (Signed)
Lori [name], Lori [name] ([id number]) Visit Report for 05/29/2021 Arrival Information Details Patient Name: Date of Service: Lori Mt NNE B. 05/29/2021 1:30 PM Medical Record Number: [id number] Patient Account Number: [id number] Date of Birth/Sex: Treating RN: 19-Jul-1945 (76 y.o. [name] Primary Care Nuala Chiles: Marton Redwood Other Clinician: Referring Kynlea Blackston: Treating Breah Joa/Extender: Jarome Matin in Treatment: 6 Visit Information History Since Last Visit Added or deleted any medications: No Patient Arrived: Ambulatory Any new allergies or adverse reactions: No Arrival Time: 13:47 Had a fall or experienced change in No Transfer Assistance: None activities of daily living that may affect Patient Identification Verified: Yes risk of falls: Secondary Verification Process Completed: Yes Signs or symptoms of abuse/neglect since last visito No Patient Requires Transmission-Based Precautions: No Hospitalized since last visit: No Patient Has Alerts: No Implantable device outside of the clinic excluding No cellular tissue based products placed in the center since last visit: Has Dressing in Place as Prescribed: Yes Pain Present Now: No Electronic Signature(s) Signed: 05/29/2021 5:15:33 PM By: [name] Entered By: [name] on 05/29/2021 13:51:07 -------------------------------------------------------------------------------- Clinic Level of Care Assessment Details Patient Name: Date of Service: Lori Mt NNE B. 05/29/2021 1:30 PM Medical Record Number: [id number] Patient Account Number: [id number] Date of Birth/Sex: Treating RN: 1946-05-08 (76 y.o. [name] Primary Care Makaley Storts: Marton Redwood Other Clinician: Referring Codee Tutson: Treating Gustave Lindeman/Extender: Jarome Matin in Treatment: 6 Clinic Level of Care Assessment Items TOOL 4 Quantity Score X- 1 0 Use when only an EandM is performed on FOLLOW-UP  visit ASSESSMENTS - Nursing Assessment / Reassessment X- 1 10 Reassessment of Co-morbidities (includes updates in patient status) X- 1 5 Reassessment of Adherence to Treatment Plan ASSESSMENTS - Wound and Skin A ssessment / Reassessment X - Simple Wound Assessment / Reassessment - one wound 1 5 []  - 0 Complex Wound Assessment / Reassessment - multiple wounds []  - 0 Dermatologic / Skin Assessment (not related to wound area) ASSESSMENTS - Focused Assessment []  - 0 Circumferential Edema Measurements - multi extremities []  - 0 Nutritional Assessment / Counseling / Intervention []  - 0 Lower Extremity Assessment (monofilament, tuning fork, pulses) []  - 0 Peripheral Arterial Disease Assessment (using hand held doppler) ASSESSMENTS - Ostomy and/or Continence Assessment and Care []  - 0 Incontinence Assessment and Management []  - 0 Ostomy Care Assessment and Management (repouching, etc.) PROCESS - Coordination of Care []  - 0 Simple Patient / Family Education for ongoing care X- 1 20 Complex (extensive) Patient / Family Education for ongoing care []  - 0 Staff obtains Programmer, systems, Records, T Results / Process Orders est []  - 0 Staff telephones HHA, Nursing Homes / Clarify orders / etc []  - 0 Routine Transfer to another Facility (non-emergent condition) []  - 0 Routine Hospital Admission (non-emergent condition) []  - 0 New Admissions / Biomedical engineer / Ordering NPWT Apligraf, etc. , []  - 0 Emergency Hospital Admission (emergent condition) []  - 0 Simple Discharge Coordination []  - 0 Complex (extensive) Discharge Coordination PROCESS - Special Needs []  - 0 Pediatric / Minor Patient Management []  - 0 Isolation Patient Management []  - 0 Hearing / Language / Visual special needs []  - 0 Assessment of Community assistance (transportation, D/C planning, etc.) []  - 0 Additional assistance / Altered mentation []  - 0 Support Surface(s) Assessment (bed, cushion, seat,  etc.) INTERVENTIONS - Wound Cleansing / Measurement []  - 0 Simple Wound Cleansing - one wound []  - 0 Complex Wound Cleansing - multiple wounds X- 1 5  Wound Imaging (photographs - any number of wounds) []  - 0 Wound Tracing (instead of photographs) []  - 0 Simple Wound Measurement - one wound []  - 0 Complex Wound Measurement - multiple wounds INTERVENTIONS - Wound Dressings []  - 0 Small Wound Dressing one or multiple wounds []  - 0 Medium Wound Dressing one or multiple wounds []  - 0 Large Wound Dressing one or multiple wounds []  - 0 Application of Medications - topical []  - 0 Application of Medications - injection INTERVENTIONS - Miscellaneous []  - 0 External ear exam []  - 0 Specimen Collection (cultures, biopsies, blood, body fluids, etc.) []  - 0 Specimen(s) / Culture(s) sent or taken to Lab for analysis []  - 0 Patient Transfer (multiple staff / Civil Service fast streamer / Similar devices) []  - 0 Simple Staple / Suture removal (25 or less) []  - 0 Complex Staple / Suture removal (26 or more) []  - 0 Hypo / Hyperglycemic Management (close monitor of Blood Glucose) []  - 0 Ankle / Brachial Index (ABI) - do not check if billed separately X- 1 5 Vital Signs Has the patient been seen at the hospital within the last three years: Yes Total Score: 50 Level Of Care: New/Established - Level 2 Electronic Signature(s) Signed: 05/29/2021 5:15:33 PM By: [name] Entered By: [name] on 05/29/2021 14:17:30 -------------------------------------------------------------------------------- Encounter Discharge Information Details Patient Name: Date of Service: Lori [name], [name] 05/29/2021 1:30 PM Medical Record Number: [id number] Patient Account Number: [id number] Date of Birth/Sex: Treating RN: 21-Jul-1945 (76 y.o. [name] Primary Care Osha Rane: Marton Redwood Other Clinician: Referring Uziel Covault: Treating Kehinde Totzke/Extender: Jarome Matin in Treatment:  6 Encounter Discharge Information Items Discharge Condition: Stable Ambulatory Status: Ambulatory Discharge Destination: Home Transportation: Private Auto Schedule Follow-up Appointment: No Clinical Summary of Care: Provided on 05/29/2021 Form Type Recipient Paper Patient Patient Electronic Signature(s) Signed: 05/29/2021 5:15:33 PM By: [name] Entered By: [name] on 05/29/2021 14:27:31 -------------------------------------------------------------------------------- Lower Extremity Assessment Details Patient Name: Date of Service: Lori Mt NNE B. 05/29/2021 1:30 PM Medical Record Number: [id number] Patient Account Number: [id number] Date of Birth/Sex: Treating RN: Mar 11, 1946 (76 y.o. [name] Primary Care Stephania Macfarlane: Marton Redwood Other Clinician: Referring Jake Fuhrmann: Treating Maurion Walkowiak/Extender: Jarome Matin in Treatment: 6 Edema Assessment Assessed: Shirlyn Goltz: Yes] Patrice Paradise: No] Edema: [Left: Ye] [Right: s] Calf Left: Right: Point of Measurement: 32 cm From Medial Instep 30.8 cm Ankle Left: Right: Point of Measurement: 9 cm From Medial Instep 18.8 cm Vascular Assessment Pulses: Dorsalis Pedis Palpable: [Left:Yes] Electronic Signature(s) Signed: 05/29/2021 5:15:33 PM By: [name] Entered By: [name] on 05/29/2021 13:54:48 -------------------------------------------------------------------------------- Multi-Disciplinary Care Plan Details Patient Name: Date of Service: Lori [name], Gregary Signs NNE B. 05/29/2021 1:30 PM Medical Record Number: [id number] Patient Account Number: [id number] Date of Birth/Sex: Treating RN: 22-Aug-1945 (76 y.o. [name] Primary Care Ciria Bernardini: Marton Redwood Other Clinician: Referring Keivon Garden: Treating Dayami Taitt/Extender: Jarome Matin in Treatment: 6 Active Inactive Electronic Signature(s) Signed: 05/29/2021 5:15:33 PM By: [name] Entered By: [name] on  05/29/2021 14:26:42 -------------------------------------------------------------------------------- Pain Assessment Details Patient Name: Date of Service: Lori Mt NNE B. 05/29/2021 1:30 PM Medical Record Number: [id number] Patient Account Number: [id number] Date of Birth/Sex: Treating RN: 1946-05-09 (76 y.o. [name] Primary Care Marlon Vonruden: Marton Redwood Other Clinician: Referring Graycen Sadlon: Treating Maisyn Nouri/Extender: Jarome Matin in Treatment: 6 Active Problems Location of Pain Severity and Description of Pain Patient Has Paino No Site Locations Pain Management and  Medication Current Pain Management: Electronic Signature(s) Signed: 05/29/2021 5:15:33 PM By: [name] Entered By: [name] on 05/29/2021 13:51:35 -------------------------------------------------------------------------------- Patient/Caregiver Education Details Patient Name: Date of Service: Lowella Fairy 1/4/2023andnbsp1:30 PM Medical Record Number: [id number] Patient Account Number: [id number] Date of Birth/Gender: Treating RN: 1945/05/31 (76 y.o. [name] Primary Care Physician: Marton Redwood Other Clinician: Referring Physician: Treating Physician/Extender: Jarome Matin in Treatment: 6 Education Assessment Education Provided To: Patient Education Topics Provided Malignant/Atypical Wounds: Methods: Explain/Verbal Responses: State content correctly Wound/Skin Impairment: Methods: Demonstration, Explain/Verbal, Printed Responses: State content correctly Electronic Signature(s) Signed: 05/29/2021 5:15:33 PM By: [name] Entered By: [name] on 05/29/2021 13:57:05 -------------------------------------------------------------------------------- Wound Assessment Details Patient Name: Date of Service: Lori Mt NNE B. 05/29/2021 1:30 PM Medical Record Number: [id number] Patient Account Number:  [id number] Date of Birth/Sex: Treating RN: 1946-01-09 (76 y.o. [name] Primary Care Romulus Hanrahan: Marton Redwood Other Clinician: Referring Michaelpaul Apo: Treating Donny Heffern/Extender: Jarome Matin in Treatment: 6 Wound Status Wound Number: 1 Primary Malignant Wound Etiology: Wound Location: Left, Medial Lower Leg Wound Status: Healed - Epithelialized Wounding Event: Gradually Appeared Comorbid Glaucoma, Asthma, Lupus Erythematosus, Rheumatoid Date Acquired: 03/04/2021 History: Arthritis Weeks Of Treatment: 6 Clustered Wound: No Photos Wound Measurements Length: (cm) Width: (cm) Depth: (cm) Area: (cm) Volume: (cm) 0 % Reduction in Area: 100% 0 % Reduction in Volume: 100% 0 0 0 Wound Description Classification: Full Thickness Without Exposed Support Structur es Electronic Signature(s) Signed: 05/29/2021 5:15:33 PM By: [name] Entered By: [name] on 05/29/2021 14:15:27 -------------------------------------------------------------------------------- Vitals Details Patient Name: Date of Service: Lori [name], [name] 05/29/2021 1:30 PM Medical Record Number: [id number] Patient Account Number: [id number] Date of Birth/Sex: Treating RN: 13-May-1946 (76 y.o. [name] Primary Care Sanam Marmo: Marton Redwood Other Clinician: Referring Shaelee Forni: Treating Nasiyah Laverdiere/Extender: Jarome Matin in Treatment: 6 Vital Signs Time Taken: 13:51 Temperature (F): 98.2 Height (in): 65.5 Pulse (bpm): 82 Weight (lbs): 145 Respiratory Rate (breaths/min): 16 Body Mass Index (BMI): 23.8 Blood Pressure (mmHg): 109/67 Reference Range: 80 - 120 mg / dl Electronic Signature(s) Signed: 05/29/2021 5:15:33 PM By: [name] Entered By: [name] on 05/29/2021 13:51:28

## 2021-05-29 NOTE — Progress Notes (Addendum)
HALIMAH, BEWICK ([id number]) Visit Report for 05/29/2021 Chief Complaint Document Details Patient Name: Date of Service: [name] 05/29/2021 1:30 PM Medical Record Number: [id number] Patient Account Number: [id number] Date of Birth/Sex: Treating RN: [birth year] (76 y.o. [name] Primary Care Provider: Marton Redwood Other Clinician: Referring Provider: Treating Provider/Extender: Jarome Matin in Treatment: 6 Information Obtained from: Patient Chief Complaint Squamous cell carcinoma left leg Electronic Signature(s) Signed: 05/29/2021 2:09:40 PM By: Worthy Keeler PA-C Entered By: Worthy Keeler on 05/29/2021 14:09:40 -------------------------------------------------------------------------------- HPI Details Patient Name: Date of Service: [name] 05/29/2021 1:30 PM Medical Record Number: [id number] Patient Account Number: [id number] Date of Birth/Sex: Treating RN: 1946-04-26 (76 y.o. [name] Primary Care Provider: Marton Redwood Other Clinician: Referring Provider: Treating Provider/Extender: Jarome Matin in Treatment: 6 History of Present Illness HPI Description: 04/17/2021 patient presents today for initial inspection here in our clinic concerning issues that she has been having with a wound on her left lower extremity. This was actually off 4 November surgically excised according to what she tells me by her physician and sent for pathology. It is very confusing whether or not the pathology report showed clear margins though the patient's been referred to the Mohs surgeon in order to ensure the "edges are clear". Again I am uncertain if this is something she insisted on or for physician solid evidence on the pathology report this was necessary either way I cannot be certain the squamous cell carcinoma is cleared at this point. Apparently that was the diagnosis. Either way this means I will not be  performing any debridement at this time. With regard to her other major medical problems she does have hereditary hemochromatosis as well as systemic lupus and rheumatoid arthritis. She also tells me that she has been using some unconventional things including "food grade peroxide, ethylene glycol (which she apparently found online as a treatment for her condition), and has been on doxycycline". 12/2; patient presents for 1 week follow-up. She was admitted at last clinic visit. She states that she is not able to obtain Santyl but has recently obtained Medihoney. She has not started the Medihoney. She has no issues or complaints today. She currently denies signs of infection. 05/08/2021 upon evaluation patient appears to actually be doing quite well in regard to her wound all things considered. She has been using Medihoney and to be honest Medihoney seems to be doing awesome. I do not see any signs of active infection locally nor systemically at this point. No fevers, chills, nausea, vomiting, or diarrhea. 05/29/2021 upon evaluation today patient appears to be doing well in fact she appears to be completely healed she has been using the Medihoney. She tells me that within the next 2 weeks or so she does have the appointment with the Mohs surgeon again this was a skin cancer site and its not definitive that it was all gotten initially. Electronic Signature(s) Signed: 05/29/2021 2:22:25 PM By: Worthy Keeler PA-C Entered By: Worthy Keeler on 05/29/2021 14:22:25 -------------------------------------------------------------------------------- Physical Exam Details Patient Name: Date of Service: [name] 05/29/2021 1:30 PM Medical Record Number: [id number] Patient Account Number: [id number] Date of Birth/Sex: Treating RN: 1945-10-28 (76 y.o. [name] Primary Care Provider: Marton Redwood Other Clinician: Referring Provider: Treating Provider/Extender: Jarome Matin in Treatment: 6 Constitutional Well-nourished and well-hydrated in no acute distress. Respiratory normal breathing without difficulty. Psychiatric  this patient is able to make decisions and demonstrates good insight into disease process. Alert and Oriented x 3. pleasant and cooperative. Notes Upon inspection patient's wound bed actually showed signs of good epithelization at this point. I do not see any signs of active infection or anything open currently. Electronic Signature(s) Signed: 05/29/2021 2:22:48 PM By: Worthy Keeler PA-C Entered By: Worthy Keeler on 05/29/2021 14:22:48 -------------------------------------------------------------------------------- Physician Orders Details Patient Name: Date of Service: [name] 05/29/2021 1:30 PM Medical Record Number: [id number] Patient Account Number: [id number] Date of Birth/Sex: Treating RN: 26-Jun-1945 (76 y.o. [name] Primary Care Provider: Marton Redwood Other Clinician: Referring Provider: Treating Provider/Extender: Jarome Matin in Treatment: 6 Verbal / Phone Orders: No Diagnosis Coding ICD-10 Coding Code Description [phone] Squamous cell carcinoma of skin of left lower limb, including hip L97.822 Non-pressure chronic ulcer of other part of left lower leg with fat layer exposed E83.110 Hereditary hemochromatosis M32.9 Systemic lupus erythematosus, unspecified M06.89 Other specified rheumatoid arthritis, multiple sites Discharge From [facility] Services Discharge from [place] - -No further follow up needed at this time. Call if any issues. -Keep appointment for Moh's surgery Non Wound Condition Protect area with: - Apply bandaid or protective dressing Electronic Signature(s) Signed: 05/29/2021 4:51:59 PM By: Worthy Keeler PA-C Signed: 05/29/2021 5:15:33 PM By: [name] Entered By: [name] on 05/29/2021  14:21:02 -------------------------------------------------------------------------------- Problem List Details Patient Name: Date of Service: [name] 05/29/2021 1:30 PM Medical Record Number: [id number] Patient Account Number: [id number] Date of Birth/Sex: Treating RN: 02/23/1946 (76 y.o. [name] Primary Care Provider: Marton Redwood Other Clinician: Referring Provider: Treating Provider/Extender: Jarome Matin in Treatment: 6 Active Problems ICD-10 Encounter Code Description Active Date MDM Diagnosis C44.729 Squamous cell carcinoma of skin of left lower limb, including hip 04/17/2021 No Yes L97.822 Non-pressure chronic ulcer of other part of left lower leg with fat layer exposed 04/17/2021 No Yes E83.110 Hereditary hemochromatosis 04/17/2021 No Yes M32.9 Systemic lupus erythematosus, unspecified 04/17/2021 No Yes M06.89 Other specified rheumatoid arthritis, multiple sites 04/17/2021 No Yes Inactive Problems Resolved Problems Electronic Signature(s) Signed: 05/29/2021 2:09:29 PM By: Worthy Keeler PA-C Entered By: Worthy Keeler on 05/29/2021 14:09:29 -------------------------------------------------------------------------------- Progress Note Details Patient Name: Date of Service: [name] 05/29/2021 1:30 PM Medical Record Number: [id number] Patient Account Number: [id number] Date of Birth/Sex: Treating RN: 12-11-1945 (76 y.o. [name] Primary Care Provider: Marton Redwood Other Clinician: Referring Provider: Treating Provider/Extender: Jarome Matin in Treatment: 6 Subjective Chief Complaint Information obtained from Patient Squamous cell carcinoma left leg History of Present Illness (HPI) 04/17/2021 patient presents today for initial inspection here in our clinic concerning issues that she has been having with a wound on her left lower extremity. This was actually off 4 November  surgically excised according to what she tells me by her physician and sent for pathology. It is very confusing whether or not the pathology report showed clear margins though the patient's been referred to the Mohs surgeon in order to ensure the "edges are clear". Again I am uncertain if this is something she insisted on or for physician solid evidence on the pathology report this was necessary either way I cannot be certain the squamous cell carcinoma is cleared at this point. Apparently that was the diagnosis. Either way this means I will not be performing any debridement at this time. With regard to  her other major medical problems she does have hereditary hemochromatosis as well as systemic lupus and rheumatoid arthritis. She also tells me that she has been using some unconventional things including "food grade peroxide, ethylene glycol (which she apparently found online as a treatment for her condition), and has been on doxycycline". 12/2; patient presents for 1 week follow-up. She was admitted at last clinic visit. She states that she is not able to obtain Santyl but has recently obtained Medihoney. She has not started the Medihoney. She has no issues or complaints today. She currently denies signs of infection. 05/08/2021 upon evaluation patient appears to actually be doing quite well in regard to her wound all things considered. She has been using Medihoney and to be honest Medihoney seems to be doing awesome. I do not see any signs of active infection locally nor systemically at this point. No fevers, chills, nausea, vomiting, or diarrhea. 05/29/2021 upon evaluation today patient appears to be doing well in fact she appears to be completely healed she has been using the Medihoney. She tells me that within the next 2 weeks or so she does have the appointment with the Mohs surgeon again this was a skin cancer site and its not definitive that it was all gotten  initially. Objective Constitutional Well-nourished and well-hydrated in no acute distress. Vitals Time Taken: 1:51 PM, Height: 65.5 in, Weight: 145 lbs, BMI: 23.8, Temperature: 98.2 F, Pulse: 82 bpm, Respiratory Rate: 16 breaths/min, Blood Pressure: 109/67 mmHg. Respiratory normal breathing without difficulty. Psychiatric this patient is able to make decisions and demonstrates good insight into disease process. Alert and Oriented x 3. pleasant and cooperative. General Notes: Upon inspection patient's wound bed actually showed signs of good epithelization at this point. I do not see any signs of active infection or anything open currently. Integumentary (Hair, Skin) Wound #1 status is Healed - Epithelialized. Original cause of wound was Gradually Appeared. The date acquired was: 03/04/2021. The wound has been in treatment 6 weeks. The wound is located on the Left,Medial Lower Leg. The wound measures 0cm length x 0cm width x 0cm depth; 0cm^2 area and 0cm^3 volume. Assessment Active Problems ICD-10 Squamous cell carcinoma of skin of left lower limb, including hip Non-pressure chronic ulcer of other part of left lower leg with fat layer exposed Hereditary hemochromatosis Systemic lupus erythematosus, unspecified Other specified rheumatoid arthritis, multiple sites Plan Discharge From [facility] Services: Discharge from [place] - -No further follow up needed at this time. Call if any issues. -Keep appointment for Moh's surgery Non Wound Condition: Protect area with: - Apply bandaid or protective dressing 1. I would recommend currently that we discontinue wound care services as the patient is completely healed based on what I see I do not see anything open at this point. 2. I am going to recommend she continue to follow with the Mohs surgeon there is still some question as to whether or not again all the skin cancer was removed it sounds like the answer is no but again I was never able  to personally see the definitive report showing whether margins were clear or not. We will see the patient back for follow-up visit as needed. Electronic Signature(s) Signed: 05/29/2021 2:23:21 PM By: Worthy Keeler PA-C Entered By: Worthy Keeler on 05/29/2021 14:23:21 -------------------------------------------------------------------------------- SuperBill Details Patient Name: Date of Service: [name] 05/29/2021 Medical Record Number: [id number] Patient Account Number: [id number] Date of Birth/Sex: Treating RN: 03-31-1946 (76 y.o. [name] Primary  Care Provider: Marton Redwood Other Clinician: Referring Provider: Treating Provider/Extender: Jarome Matin in Treatment: 6 Diagnosis Coding ICD-10 Codes Code Description [phone] Squamous cell carcinoma of skin of left lower limb, including hip L97.822 Non-pressure chronic ulcer of other part of left lower leg with fat layer exposed E83.110 Hereditary hemochromatosis M32.9 Systemic lupus erythematosus, unspecified M06.89 Other specified rheumatoid arthritis, multiple sites Facility Procedures CPT4 Code: [id number] Description: [phone] - WOUND CARE VISIT-LEV 2 EST PT Modifier: Quantity: 1 Physician Procedures : CPT4 Code Description Modifier [id number] - WC PHYS LEVEL 3 - EST PT ICD-10 Diagnosis Description C44.729 Squamous cell carcinoma of skin of left lower limb, including hip L97.822 Non-pressure chronic ulcer of other part of left lower leg with fat  layer exposed E83.110 Hereditary hemochromatosis M32.9 Systemic lupus erythematosus, unspecified Quantity: 1 Electronic Signature(s) Signed: 05/29/2021 2:23:39 PM By: Worthy Keeler PA-C Entered By: Worthy Keeler on 05/29/2021 14:23:39

## 2021-06-05 DIAGNOSIS — C44729 Squamous cell carcinoma of skin of left lower limb, including hip: Secondary | ICD-10-CM | POA: Diagnosis not present

## 2021-06-05 DIAGNOSIS — I872 Venous insufficiency (chronic) (peripheral): Secondary | ICD-10-CM | POA: Diagnosis not present

## 2021-06-24 DIAGNOSIS — M329 Systemic lupus erythematosus, unspecified: Secondary | ICD-10-CM | POA: Diagnosis not present

## 2021-06-24 DIAGNOSIS — Z79899 Other long term (current) drug therapy: Secondary | ICD-10-CM | POA: Diagnosis not present

## 2021-07-04 ENCOUNTER — Inpatient Hospital Stay: Payer: PPO

## 2021-07-04 ENCOUNTER — Inpatient Hospital Stay: Payer: PPO | Attending: Hematology & Oncology

## 2021-07-18 ENCOUNTER — Telehealth: Payer: Self-pay | Admitting: Cardiovascular Disease

## 2021-07-18 NOTE — Telephone Encounter (Signed)
Patient reports more frequent and harder palpitations over past several months and wanted to let Dr. Claiborne Billings know. She feels as though she can't breathe and feels faint during each episodes. She has 1 cup caffeinated coffee each morning and tries to stay hydrated. Takes metoprolol succinate 25 mg in the morning and 12.5 or 25 mg as needed after that. Made appointment with [facility] for 3/3.

## 2021-07-18 NOTE — Telephone Encounter (Signed)
Patient c/o Palpitations:  High priority if patient c/o lightheadedness, shortness of breath, or chest pain  How long have you had palpitations/irregular HR/ Afib? Are you having the symptoms now? Last couple of months  Are you currently experiencing lightheadedness, SOB or CP? Lightheadedness, dizziness, pain down the right arm   Do you have a history of afib (atrial fibrillation) or irregular heart rhythm?    Have you checked your BP or HR? (document readings if available): no  Are you experiencing any other symptoms?

## 2021-07-25 NOTE — Progress Notes (Signed)
Office Visit    Patient Name: Lori [name] Date of Encounter: 07/26/2021  Primary Care Provider:  Ginger Organ., MD Primary Cardiologist:  Shelva Majestic, MD  Chief Complaint    76 year old female with a history of palpitations, 1st degree AV block, PACs, atrial tachycardia, chronic fatigue, hereditary hemochromatosis, hypothyroidism, lupus, rheumatoid arthritis, and irritable bowel syndrome who presents for follow-up related to palpitations.  Past Medical History    Past Medical History:  Diagnosis Date   Autoimmune hepatitis ([place])    Cancer ([place]) 06/2017   SQUAMOS CELL REMOVED FROM FOREHEAD   Chronic fatigue    Complication of anesthesia    Partial Hysterectomy-Severe pain that radiated to esophogaus, and 12 more episode    Hemochromatosis    Hormone disorder    Hypothyroidism    IBS (irritable bowel syndrome)    Lupus ([facility])    Mixed connective tissue disease ([place])    Osteoporosis 07/2017   T score -2.7 stable from prior DEXA   Palpitations    Rheumatoid arthritis ([place])    Past Surgical History:  Procedure Laterality Date   ABDOMINAL HYSTERECTOMY     Partial; pt states has only had ovaries removed   ABDOMINAL SURGERY     ABDOMINOPLASTY   BLADDER SUSPENSION     BREAST SURGERY  1980   LUMPECTOMY X 2 FROM RIGHT BREAST   CARDIOVASCULAR STRESS TEST  08/28/2011   Normal   CAROTID DOPPLER  08/28/2011   Normal, no evidence of significant diameter reduction, dissection, or vascular abnormality   COLONOSCOPY W/ POLYPECTOMY  2018   IR IMAGING GUIDED PORT INSERTION  04/27/2020   IR RADIOLOGIST EVAL & MGMT  03/20/2021   LAPAROSCOPIC CHOLECYSTECTOMY     phlebotomies     TONSILLECTOMY     TONSILLECTOMY AND ADENOIDECTOMY     TRANSTHORACIC ECHOCARDIOGRAM  01/29/2011   EF >55%, mild concentric LVH   TUBAL LIGATION      Allergies  Allergies  Allergen Reactions   Albumin (Human)     Pt stated, "I am not allergic to this and don't know what this means"; 04/27/17    Amoxicillin Nausea And Vomiting   Codeine Nausea Only   Erythromycin Nausea Only and Other (See Comments)   Iodinated Contrast Media Other (See Comments)   Keflex [Cephalexin] Itching   Meperidine     Pt stated, "I don't know what this is"; 04/27/17   Metoclopramide Hcl    Other Other (See Comments)    Beef Chicken Dairy Eggs  Pt stated, "My body can't process protein; it makes me sick"   Percocet [Oxycodone-Acetaminophen]     Made pt sick    Shellfish Allergy Other (See Comments)    Pt stated, "I am not allergic to this anymore"; 04/27/17 Pt stated, "I am not allergic to this anymore"; 04/27/17   Shellfish-Derived Products    Oxycodone Nausea And Vomiting    Pt stated, "I do not know if I am allergic to this"; 04/27/17    History of Present Illness    76 year old female with the above pats medical history including palpitations, 1st degree AV block, PACs, atrial tachycardia, chronic fatigue, hereditary hemochromatosis, hypothyroidism, lupus, rheumatoid arthritis, and irritable bowel syndrome.  Echocardiogram in 2012 showed EF greater than 55%, mild concentric LVH.  She had a normal Myoview in 2013.  Carotid Dopplers were normal in April 2013 as well.  She has a history of palpitations managed on metoprolol.  She has a history of autoimmune  disease with mixed connective tissue disease, autoimmune hepatitis, rheumatoid arthritis, lupus, hemochromatosis, and undergoes periodic phlebotomy by  Dr. Marin Olp. She was last seen in the office on 11/20/2020 was stable overall from a cardiac standpoint, her metoprolol was increased to 37.5 mg daily.  Annual follow-up was recommended.  She called our office on 07/18/2021 with complaints of increase in palpitations with accompanying lightheadedness and dyspnea over the past several months.  She presents today for follow-up.  Since she called our office and since her last visit she reports an improvement in her palpitations.  States that she had an  upper respiratory infection and a bladder infection approximately 1 month ago and at that time noticed an increase in palpitations with associated shortness of breath, occasional dizziness.  Her symptoms would last approximately 5 to 10 minutes at a time and improved with rest and lying down in her bed.  She has been taking her metoprolol succinate 25 mg in the morning and an additional half tablet in the evening as needed, though the half tablet evening dose is prescribed daily.  She did not take her extra dose of metoprolol regularly when she was noticing increased palpitations. She denies any symptoms concerning for angina, denies presyncope, syncope. Overall, she reports feeling much better but was concerned over her recent symptoms.    Home Medications    Current Outpatient Medications  Medication Sig Dispense Refill   amiloride-hydrochlorothiazide (MODURETIC) 5-50 MG tablet Take 0.5 tablets by mouth daily. As Needed     Ascorbic Acid (VITAMIN C) 1000 MG tablet Take 3,000 mg by mouth daily.     Cholecalciferol (VITAMIN D PO) Take 1 tablet by mouth daily.     dextroamphetamine (DEXEDRINE SPANSULE) 5 MG 24 hr capsule Take 5 mg by mouth daily.  0   fish oil-omega-3 fatty acids 1000 MG capsule Take 1 g by mouth daily.     fluconazole (DIFLUCAN) 200 MG tablet Take 100 mg by mouth daily as needed (yeast).     MELATONIN PO Take 2 mg by mouth at bedtime.      metoprolol tartrate (LOPRESSOR) 25 MG tablet Take 1 tablet in the morning and 1 tablets as needed at bedtime     Multiple Vitamin (MULTIVITAMIN PO) Take by mouth.     nystatin (MYCOSTATIN) 500000 units TABS tablet Take 1 tablet by mouth at bedtime.     potassium chloride SA (K-DUR,KLOR-CON) 20 MEQ tablet Take 20 mEq by mouth daily.      PRASTERONE, DHEA, PO Take 28 mg by mouth daily.      Probiotic Product (PROBIOTIC DAILY PO) Take 1 capsule by mouth daily.     progesterone (PROMETRIUM) 100 MG capsule Take 100 mg by mouth daily.      thyroid  (ARMOUR) 15 MG tablet Take 15 mg by mouth 3 (three) times a week.     thyroid (ARMOUR) 60 MG tablet Take 60 mg by mouth daily before breakfast.     vitamin B-12 (CYANOCOBALAMIN) 1000 MCG tablet Take 1,000 mcg by mouth daily.     zinc gluconate 50 MG tablet Take 50 mg by mouth daily.     Current Facility-Administered Medications  Medication Dose Route Frequency Provider Last Rate Last Admin   triamcinolone acetonide (KENALOG) 10 MG/ML injection 10 mg  10 mg Other Once Wallene Huh, DPM         Review of Systems    She denies chest pain, dyspnea, pnd, orthopnea, n, v, dizziness, syncope, edema, weight gain,  or early satiety. All other systems reviewed and are otherwise negative except as noted above.   Physical Exam    VS:  BP 108/78 (BP Location: Left Arm, Patient Position: Sitting, Cuff Size: Normal)    Pulse 74    Ht 5' 5.73" (1.67 m)    Wt 154 lb 1.6 oz (69.9 kg)    BMI 25.08 kg/m   GEN: Well nourished, well developed, in no acute distress. HEENT: normal. Neck: Supple, no JVD, carotid bruits, or masses. Cardiac: RRR, no murmurs, rubs, or gallops. No clubbing, cyanosis, edema.  Radials/DP/PT 2+ and equal bilaterally.  Respiratory:  Respirations regular and unlabored, clear to auscultation bilaterally. GI: Soft, nontender, nondistended, BS + x 4. MS: no deformity or atrophy. Skin: warm and dry, no rash. Neuro:  Strength and sensation are intact. Psych: Normal affect.  Accessory Clinical Findings    ECG personally reviewed by me today -Sinus rhythm, 74 bpm, with marked sinus arrhythmia, PACs, first-degree AV block- no acute changes.  Lab Results  Component Value Date   WBC 11.0 (H) 05/02/2021   HGB 13.5 05/02/2021   HCT 40.7 05/02/2021   MCV 94.9 05/02/2021   PLT 195 05/02/2021   Lab Results  Component Value Date   CREATININE 0.84 05/02/2021   BUN 16 05/02/2021   NA 138 05/02/2021   K 3.8 05/02/2021   CL 102 05/02/2021   CO2 29 05/02/2021   Lab Results  Component  Value Date   ALT 18 05/02/2021   AST 25 05/02/2021   ALKPHOS 68 05/02/2021   BILITOT 0.5 05/02/2021   Lab Results  Component Value Date   CHOL (H) 08/18/2008    209        ATP III CLASSIFICATION:  <200     mg/dL   Desirable  200-239  mg/dL   Borderline High  >=240    mg/dL   High          HDL 58 08/18/2008   LDLCALC (H) 08/18/2008    134        Total Cholesterol/HDL:CHD Risk Coronary Heart Disease Risk Table                     Men   Women  1/2 Average Risk   3.4   3.3  Average Risk       5.0   4.4  2 X Average Risk   9.6   7.1  3 X Average Risk  23.4   11.0        Use the calculated Patient Ratio above and the CHD Risk Table to determine the patient's CHD Risk.        ATP III CLASSIFICATION (LDL):  <100     mg/dL   Optimal  100-129  mg/dL   Near or Above                    Optimal  130-159  mg/dL   Borderline  160-189  mg/dL   High  >190     mg/dL   Very High   TRIG 87 08/18/2008   CHOLHDL 3.6 08/18/2008    No results found for: HGBA1C  Assessment & Plan    1. Palpitations/h/o PACs/atrial tachycardia: History of PACs, atrial tachycardia. Echo in 2012 showed EF greater than 55%, mild concentric LVH. 1 month ago she noticed an increase in palpitations with associated lightheadedness, shortness of breath that occurred in the setting of acute upper respiratory infection. Her symptoms  have improved over the last 2 weeks and she thinks she is back to baseline. She has not been taking her metoprolol consistently as prescribed. We discussed the benefit of metoprolol in controlling symptoms, and I advised her to take her metoprolol as prescribed. She verbalized understanding given recent increase in palpitations with associated symptoms of dyspnea and lightheadedness, will check 7-day ZIO monitor, will repeat echocardiogram, check BMP and TSH today.  2. 1st Degree AV block: Noted on prior EKGs. EKG today shows sinus rhythm with sinus arrhythmia, 74 bpm, first-degree AV block  with PACs, PR interval 248 ms.  Stable.  Continue metoprolol as above.  3. Autoimmune disease/Chronic fatigue: Has a reported history of lupus and rheumatoid arthritis.  She states she does experience chronic fatigue though it does not seem that her beta-blocker therapy is contributing to her fatigue at this time.  Follows with rheumatology.   4. Disposition: Follow-up with Dr. Claiborne Billings as scheduled on 08/28/2021.  Lenna Sciara, NP 07/26/2021, 3:26 PM

## 2021-07-26 ENCOUNTER — Encounter (HOSPITAL_BASED_OUTPATIENT_CLINIC_OR_DEPARTMENT_OTHER): Payer: Self-pay | Admitting: Nurse Practitioner

## 2021-07-26 ENCOUNTER — Ambulatory Visit (INDEPENDENT_AMBULATORY_CARE_PROVIDER_SITE_OTHER): Payer: PPO

## 2021-07-26 ENCOUNTER — Ambulatory Visit (HOSPITAL_BASED_OUTPATIENT_CLINIC_OR_DEPARTMENT_OTHER): Payer: PPO | Admitting: Nurse Practitioner

## 2021-07-26 ENCOUNTER — Other Ambulatory Visit: Payer: Self-pay

## 2021-07-26 VITALS — BP 108/78 | HR 74 | Ht 65.73 in | Wt 154.1 lb

## 2021-07-26 DIAGNOSIS — I491 Atrial premature depolarization: Secondary | ICD-10-CM

## 2021-07-26 DIAGNOSIS — R5382 Chronic fatigue, unspecified: Secondary | ICD-10-CM

## 2021-07-26 DIAGNOSIS — I471 Supraventricular tachycardia: Secondary | ICD-10-CM

## 2021-07-26 DIAGNOSIS — R002 Palpitations: Secondary | ICD-10-CM | POA: Diagnosis not present

## 2021-07-26 DIAGNOSIS — I440 Atrioventricular block, first degree: Secondary | ICD-10-CM | POA: Diagnosis not present

## 2021-07-26 DIAGNOSIS — M359 Systemic involvement of connective tissue, unspecified: Secondary | ICD-10-CM | POA: Diagnosis not present

## 2021-07-26 DIAGNOSIS — I4719 Other supraventricular tachycardia: Secondary | ICD-10-CM

## 2021-07-26 NOTE — Patient Instructions (Addendum)
Medication Instructions:  ?Continue current medications.  ?Please take Metoprolol as prescribed.  ? ?*If you need a refill on your cardiac medications before your next appointment, please call your pharmacy* ? ? ?Lab Work: ?Your physician recommends that you return for lab work today: BMP, TSH ? ?If you have labs (blood work) drawn today and your tests are completely normal, you will receive your results only by: ?MyChart Message (if you have MyChart) OR ?A paper copy in the mail ?If you have any lab test that is abnormal or we need to change your treatment, we will call you to review the results. ? ? ?Testing/Procedures: ?Your physician has requested that you have an echocardiogram. Echocardiography is a painless test that uses sound waves to create images of your heart. It provides your doctor with information about the size and shape of your heart and how well your heart?s chambers and valves are working. This procedure takes approximately one hour. There are no restrictions for this procedure.  ? ?Your physician has recommended that you wear a Zio monitor.  ? ?This monitor is a medical device that records the heart?s electrical activity. Doctors most often use these monitors to diagnose arrhythmias. Arrhythmias are problems with the speed or rhythm of the heartbeat. The monitor is a small device applied to your chest. You can wear one while you do your normal daily activities. While wearing this monitor if you have any symptoms to push the button and record what you felt. Once you have worn this monitor for the period of time provider prescribed (for 7 days), you will return the monitor device in the postage paid box. Once it is returned they will download the data collected and provide [place] with a report which the provider will then review and we will call you with those results. Important tips: ? ?Avoid showering during the first 24 hours of wearing the monitor. ?Avoid excessive sweating to help maximize wear  time. ?Do not submerge the device, no hot tubs, and no swimming pools. ?Keep any lotions or oils away from the patch. ?After 24 hours you may shower with the patch on. Take brief showers with your back facing the shower head.  ?Do not remove patch once it has been placed because that will interrupt data and decrease adhesive wear time. ?Push the button when you have any symptoms and write down what you were feeling. ?Once you have completed wearing your monitor, remove and place into box which has postage paid and place in your outgoing mailbox.  ?If for some reason you have misplaced your box then call our office and we can provide another box and/or mail it off for you. ? ? ?Follow-Up: ?At [facility], you and your health needs are our priority.  As part of our continuing mission to provide you with exceptional heart care, we have created designated Provider Care Teams.  These Care Teams include your primary Cardiologist (physician) and Advanced Practice Providers (APPs -  Physician Assistants and Nurse Practitioners) who all work together to provide you with the care you need, when you need it. ? ?We recommend signing up for the patient portal called "MyChart".  Sign up information is provided on this After Visit Summary.  MyChart is used to connect with patients for Virtual Visits (Telemedicine).  Patients are able to view lab/test results, encounter notes, upcoming appointments, etc.  Non-urgent messages can be sent to your provider as well.   ?To learn more about what you can do with  MyChart, go to [email or web address].   ? ?Your next appointment:   ?As scheduled with Dr. Claiborne Billings ? ? ?Other Instructions ? ?To prevent palpitations: ?Make sure you are adequately hydrated.  ?Avoid and/or limit caffeine containing beverages like soda or tea. ?Exercise regularly.  ?Manage stress well. ?Some over the counter medications can cause palpitations such as Benadryl, AdvilPM, TylenolPM. Regular Advil or Tylenol do not  cause palpitations.    ?

## 2021-07-30 NOTE — Telephone Encounter (Signed)
Patient was seen on 03/03. ? ?

## 2021-07-31 DIAGNOSIS — R7989 Other specified abnormal findings of blood chemistry: Secondary | ICD-10-CM | POA: Diagnosis not present

## 2021-07-31 DIAGNOSIS — D509 Iron deficiency anemia, unspecified: Secondary | ICD-10-CM | POA: Diagnosis not present

## 2021-07-31 DIAGNOSIS — R5381 Other malaise: Secondary | ICD-10-CM | POA: Diagnosis not present

## 2021-07-31 DIAGNOSIS — R7309 Other abnormal glucose: Secondary | ICD-10-CM | POA: Diagnosis not present

## 2021-07-31 DIAGNOSIS — R002 Palpitations: Secondary | ICD-10-CM | POA: Diagnosis not present

## 2021-08-06 ENCOUNTER — Other Ambulatory Visit (HOSPITAL_BASED_OUTPATIENT_CLINIC_OR_DEPARTMENT_OTHER): Payer: PPO

## 2021-08-07 DIAGNOSIS — R002 Palpitations: Secondary | ICD-10-CM | POA: Diagnosis not present

## 2021-08-08 ENCOUNTER — Telehealth: Payer: Self-pay

## 2021-08-08 LAB — BASIC METABOLIC PANEL
BUN/Creatinine Ratio: 16 (ref 12–28)
BUN: 12 mg/dL (ref 8–27)
CO2: 25 mmol/L (ref 20–29)
Calcium: 10.2 mg/dL (ref 8.7–10.3)
Chloride: 101 mmol/L (ref 96–106)
Creatinine, Ser: 0.74 mg/dL (ref 0.57–1.00)
Glucose: 94 mg/dL (ref 70–99)
Potassium: 4.4 mmol/L (ref 3.5–5.2)
Sodium: 140 mmol/L (ref 134–144)
eGFR: 84 mL/min/{1.73_m2} (ref >59)

## 2021-08-08 LAB — TSH: TSH: 2.2 u[IU]/mL (ref 0.450–4.500)

## 2021-08-08 NOTE — Telephone Encounter (Signed)
Spoke with pt. Pt was notified of results and recommendations.  ?

## 2021-08-09 DIAGNOSIS — R002 Palpitations: Secondary | ICD-10-CM | POA: Diagnosis not present

## 2021-08-28 ENCOUNTER — Encounter: Payer: Self-pay | Admitting: Cardiovascular Disease

## 2021-08-28 ENCOUNTER — Ambulatory Visit (INDEPENDENT_AMBULATORY_CARE_PROVIDER_SITE_OTHER): Payer: PPO | Admitting: Cardiovascular Disease

## 2021-08-28 VITALS — BP 116/80 | HR 87 | Ht 63.0 in | Wt 152.2 lb

## 2021-08-28 DIAGNOSIS — M359 Systemic involvement of connective tissue, unspecified: Secondary | ICD-10-CM | POA: Diagnosis not present

## 2021-08-28 DIAGNOSIS — Z7901 Long term (current) use of anticoagulants: Secondary | ICD-10-CM

## 2021-08-28 DIAGNOSIS — R5382 Chronic fatigue, unspecified: Secondary | ICD-10-CM

## 2021-08-28 DIAGNOSIS — I480 Paroxysmal atrial fibrillation: Secondary | ICD-10-CM

## 2021-08-28 DIAGNOSIS — Z5181 Encounter for therapeutic drug level monitoring: Secondary | ICD-10-CM

## 2021-08-28 MED ORDER — APIXABAN 5 MG PO TABS: 5.0000 mg | ORAL_TABLET | Freq: Two times a day (BID) | ORAL | 3 refills | Status: DC

## 2021-08-28 MED ORDER — APIXABAN 5 MG PO TABS: 5.0000 mg | ORAL_TABLET | Freq: Two times a day (BID) | ORAL | 0 refills | Status: DC

## 2021-08-28 MED ORDER — METOPROLOL SUCCINATE ER 50 MG PO TB24: 50.0000 mg | ORAL_TABLET | Freq: Every day | ORAL | 3 refills | Status: DC

## 2021-08-28 NOTE — Patient Instructions (Signed)
Medication Instructions:  ? ?-Start taking metoprolol succinate (toprol-xl) '50mg'$  once daily. ? ?-Start taking apixaban (Eliquis) '5mg'$  twice daily. ? ?*If you need a refill on your cardiac medications before your next appointment, please call your pharmacy* ? ? ?Lab Work: ?Your physician recommends that you return for lab work in: next week or 2 for FASTING CMET, CBC, Mag, Lipids ? ? ?If you have labs (blood work) drawn today and your tests are completely normal, you will receive your results only by: ?MyChart Message (if you have MyChart) OR ?A paper copy in the mail ?If you have any lab test that is abnormal or we need to change your treatment, we will call you to review the results. ? ? ?Testing/Procedures: ?Your physician has requested that you have an echocardiogram. Echocardiography is a painless test that uses sound waves to create images of your heart. It provides your doctor with information about the size and shape of your heart and how well your heart?s chambers and valves are working. This procedure takes approximately one hour. There are no restrictions for this procedure. This procedure will be done at [address] [place]. [address] ? ? ?Follow-Up: ?At [facility], you and your health needs are our priority.  As part of our continuing mission to provide you with exceptional heart care, we have created designated Provider Care Teams.  These Care Teams include your primary Cardiologist (physician) and Advanced Practice Providers (APPs -  Physician Assistants and Nurse Practitioners) who all work together to provide you with the care you need, when you need it. ? ?We recommend signing up for the patient portal called "MyChart".  Sign up information is provided on this After Visit Summary.  MyChart is used to connect with patients for Virtual Visits (Telemedicine).  Patients are able to view lab/test results, encounter notes, upcoming appointments, etc.  Non-urgent messages can be sent to your provider as  well.   ?To learn more about what you can do with MyChart, go to [email or web address].   ? ?Your next appointment:   ?4 week(s) ? ?The format for your next appointment:   ?In Person ? ?Provider:   ?Shelva Majestic, MD  ?

## 2021-08-28 NOTE — Progress Notes (Signed)
Patient ID: Lori [name], female   DOB: Apr 06, 1946, 76 y.o.   MRN: [id number] ? ? ? ? ? ? ?HPI: [name] is a 76 year old female who is a former patient of Dr. Rollene Fare.  I last saw her in June 2022. She presents for follow-up evaluation. ? ?[name] has a history of episodic palpitations in the past has been demonstrated to have a PACs with short runs of atrial tachycardia. She  has a history of irritable bowel syndrome, chronic fatigue, hypothyroidism.  She has had issues with bladder infections, and underwent bladder tacking surgery in July. She sees Dr. Christena Deem of urology in [facility]. ? ?In 2012 an echo Doppler study showed mild concentric left ventricular hypertrophy with normal systolic and diastolic function. There was trivial mitral and tricuspid regurgitation. She previously has had a normal Myoview scan in 2013 she also had carotid studies which essentially were normal.  ? ?She sees Dr. Sid Falcon of [place] frequently and is on numerous vitamin and supplement medications.She does take numerous herbal type medications as well as vitamins.  She states she was told of having possible low adrenal function.  She denies any recent episodes of chest pain. There are no episodes of presyncope or syncope. ? ?Since I last saw her 15 months ago, she has felt well from a cardiovascular standpoint.  She does note an occasional palpitation but her current dose of metoprolol 37.5 mg has controlled this most of the time.  At times she has taken an extra 12.5 mg.  She states she has had autoimmune disease with mixed connective tissue disease, autoimmune hepatitis, rheumatoid arthritis, and lupus., hemachromatosis and undergoes periodic phlebotomy by Dr. Marin Olp.  She states several family members has venous disease.  Time.  She notes occasional cramping of her legs at night.  She denies exertional claudication symptoms.  She denies varicose veins. ? ?She denies any episodes of chest  pain.  She notes mild shortness of breath when she bends over.  There is some very mild shortness of breath with activity, which has not changed significantly.  She has been undergoing blood work by Dr. Sid Falcon dose every 3 months and does plaque X treatments.  ? ?I saw her in March 2019 and over the previous year she had started therapy with protandum which she says is a mitochondrial cellular regeneration product.  She takes this for her immunologic diseases which include lupus, RA, mixedconnective tissue disorder, autoimmune hepatitis, hemachromatosis.  She feels significant improved since taking this medication.  Her palpitations had improved with metoprolol succinate and she had self reduced her dose down to 25 mg.  Her LDL cholesterol was elevated and she could not take statin therapy. ? ?I saw her in April 2021 and over the 2 prior years she had done fairly well and was continuing to see  Dr. Sid Falcon every 3 months who checks laboratory each time.  Apparently there has recently been some concern for Hashimoto thyroiditis.  She has been taking metoprolol succinate at 37.5 mg.  She is also followed by Dr. Marin Olp for hemochromatosis and she is heterozygous for the C282Y mutation.  She undergoes phlebotomy as indicated to keep her serum ferritin level less than 35.  She denied chest pain.   ? ?When I last saw her in June 2022 she continued to feel well. She underwent hip replacement in December 2021 and tolerated it well from a cardiac standpoint.  She denies any recent chest pain development.  At times  she notes rare palpitations.  She has been on very low-dose dextroamphetamine which she has been taking for some time and was originally placed on this due to very poor energy.  I discussed with her that this potentially could exacerbate palpitations.  She has been taking metoprolol 25 mg daily but at times has taken an extra pill if necessary for palpitations.  She continues to be on thyroid replacement.   Due to her immunologic diseases which by her report include include lupus, RA, mixed connective tissue disorder, autoimmune hepatitis and hemochromatosis she has continued to be on hydroxychloroquine.   ? ?Since I last saw her, she was evaluated by Diona Browner, NP on July 26, 2021 with complaints of increased palpitations.  She was on Toprol all succinate 37.5 mg.  Apparently, she was referred for a Zio patch monitor which was done from March 3 through August 02, 2021.  This revealed predominant sinus rhythm with APCs, atrial couplets, atrial triplet, and atrial fibrillation with 20% burden was detected.  The rate ranged from 57 to 173 bpm with an average of 92 bpm.  The longest episode of atrial fibrillation lasted 13 hours and 1 minute with an average rate at 88.  Recently, the patient states she has felt better and stopped taking her metoprolol.  She has continued to take Dexedrine which she had taken for fatigue but her fatigue is less.  She denies recent chest pain or shortness of breath.  She presents for evaluation.       ? ? ?Past Medical History:  ?Diagnosis Date  ? Autoimmune hepatitis ([place])   ? Cancer ([place]) 06/2017  ? SQUAMOS CELL REMOVED FROM FOREHEAD  ? Chronic fatigue   ? Complication of anesthesia   ? Partial Hysterectomy-Severe pain that radiated to esophogaus, and 12 more episode   ? Hemochromatosis   ? Hormone disorder   ? Hypothyroidism   ? IBS (irritable bowel syndrome)   ? Lupus ([place])   ? Mixed connective tissue disease ([place])   ? Osteoporosis 07/2017  ? T score -2.7 stable from prior DEXA  ? Palpitations   ? Rheumatoid arthritis ([place])   ? ? ?Past Surgical History:  ?Procedure Laterality Date  ? ABDOMINAL HYSTERECTOMY    ? Partial; pt states has only had ovaries removed  ? ABDOMINAL SURGERY    ? ABDOMINOPLASTY  ? BLADDER SUSPENSION    ? BREAST SURGERY  1980  ? LUMPECTOMY X 2 FROM RIGHT BREAST  ? CARDIOVASCULAR STRESS TEST  08/28/2011  ? Normal  ? CAROTID DOPPLER  08/28/2011  ? Normal, no evidence of  significant diameter reduction, dissection, or vascular abnormality  ? COLONOSCOPY W/ POLYPECTOMY  2018  ? IR IMAGING GUIDED PORT INSERTION  04/27/2020  ? IR RADIOLOGIST EVAL & MGMT  03/20/2021  ? LAPAROSCOPIC CHOLECYSTECTOMY    ? phlebotomies    ? TONSILLECTOMY    ? TONSILLECTOMY AND ADENOIDECTOMY    ? TRANSTHORACIC ECHOCARDIOGRAM  01/29/2011  ? EF >55%, mild concentric LVH  ? TUBAL LIGATION    ? ? ?Allergies  ?Allergen Reactions  ? Albumin (Human)   ?  Pt stated, "I am not allergic to this and don't know what this means"; 04/27/17  ? Amoxicillin Nausea And Vomiting  ? Codeine Nausea Only  ? Erythromycin Nausea Only and Other (See Comments)  ? Iodinated Contrast Media Other (See Comments)  ? Keflex [Cephalexin] Itching  ? Meperidine   ?  Pt stated, "I don't know what this is"; 04/27/17  ?  Metoclopramide Hcl   ? Other Other (See Comments)  ?  Beef ?Chicken ?Dairy ?Eggs ? ?Pt stated, "My body can't process protein; it makes me sick"  ? Percocet [Oxycodone-Acetaminophen]   ?  Made pt sick   ? Shellfish Allergy Other (See Comments)  ?  Pt stated, "I am not allergic to this anymore"; 04/27/17 ?Pt stated, "I am not allergic to this anymore"; 04/27/17  ? Shellfish-Derived Products   ? Oxycodone Nausea And Vomiting  ?  Pt stated, "I do not know if I am allergic to this"; 04/27/17  ? ? ?Current Outpatient Medications  ?Medication Sig Dispense Refill  ? apixaban (ELIQUIS) 5 MG TABS tablet Take 1 tablet (5 mg total) by mouth 2 (two) times daily. 60 tablet 0  ? apixaban (ELIQUIS) 5 MG TABS tablet Take 1 tablet (5 mg total) by mouth 2 (two) times daily. 180 tablet 3  ? metoprolol succinate (TOPROL-XL) 50 MG 24 hr tablet Take 1 tablet (50 mg total) by mouth daily. Take with or immediately following a meal. 90 tablet 3  ? amiloride-hydrochlorothiazide (MODURETIC) 5-50 MG tablet Take 0.5 tablets by mouth daily. As Needed    ? Ascorbic Acid (VITAMIN C) 1000 MG tablet Take 3,000 mg by mouth daily.    ? Cholecalciferol (VITAMIN D PO) Take 1  tablet by mouth daily.    ? fish oil-omega-3 fatty acids 1000 MG capsule Take 1 g by mouth daily.    ? fluconazole (DIFLUCAN) 200 MG tablet Take 100 mg by mouth daily as needed (yeast).    ? MELATONIN

## 2021-09-03 ENCOUNTER — Telehealth: Payer: Self-pay | Admitting: Cardiovascular Disease

## 2021-09-03 NOTE — Telephone Encounter (Signed)
Spoke with patient of Dr. Claiborne Billings  ?She was started on eliquis last week ?She states she cannot afford eliquis at $45/month and then when she is in the donut hole ?She would like to apply for patient assistance ?She states she was told we would do the application for her ?Explained that there is portions of the application for her to do and then an MD portion  ?She would like app faxed to her -- sent [phone] ?She has samples at home ?She has a free 30 day card but has not used ?Advised she take this to [facility] to get first month for free while applying for patient assistance  ?

## 2021-09-03 NOTE — Telephone Encounter (Signed)
Pt states that she would like for dr. Claiborne Billings to proceed with getting her approved as far as the Eliquis. Pt states that medication is $45.00 a month and once it goes into the donut hole it will be $1000.00, which she cannot afford. Pt also states that she may need more Samples. Please advise ?

## 2021-09-04 ENCOUNTER — Ambulatory Visit (INDEPENDENT_AMBULATORY_CARE_PROVIDER_SITE_OTHER): Payer: PPO

## 2021-09-04 ENCOUNTER — Telehealth: Payer: Self-pay

## 2021-09-04 DIAGNOSIS — R002 Palpitations: Secondary | ICD-10-CM

## 2021-09-04 DIAGNOSIS — R0600 Dyspnea, unspecified: Secondary | ICD-10-CM

## 2021-09-04 DIAGNOSIS — I480 Paroxysmal atrial fibrillation: Secondary | ICD-10-CM | POA: Diagnosis not present

## 2021-09-04 LAB — ECHOCARDIOGRAM COMPLETE
AR max vel: 2.77 cm2
AV Area VTI: 2.56 cm2
AV Area mean vel: 2.43 cm2
AV Mean grad: 3 mmHg
AV Peak grad: 4.6 mmHg
Ao pk vel: 1.07 m/s
Area-P 1/2: 4.15 cm2
S' Lateral: 2.68 cm

## 2021-09-04 NOTE — Telephone Encounter (Addendum)
Called patient regarding results. Patient had understanding of results.----- Message from Lenna Sciara, NP sent at 09/04/2021  2:12 PM EDT ----- ?Recent echocardiogram showed overall normal heart pumping function, mildly impaired relaxation of the left bottom chamber of the heart (grade 1 diastolic dysfunction), mild enlargement of the left atrium (left top chamber of the heart), mild to moderate leakiness of the tricuspid valve, calcification of the aortic valve without any evidence of significant narrowing of the valve, and borderline dilation of ascending aorta, measuring 36 mm.  Overall, no significant change from prior echocardiogram in 2012. This is very reassuring.  Continue current medications and follow-up with Dr. Claiborne Billings as planned.  Thank you. ?

## 2021-09-05 LAB — LIPID PANEL
Chol/HDL Ratio: 3.7 ratio (ref 0.0–4.4)
Cholesterol, Total: 195 mg/dL (ref 100–199)
HDL: 53 mg/dL (ref >39)
LDL Chol Calc (NIH): 121 mg/dL — ABNORMAL HIGH (ref 0–99)
Triglycerides: 118 mg/dL (ref 0–149)
VLDL Cholesterol Cal: 21 mg/dL (ref 5–40)

## 2021-09-05 LAB — COMPREHENSIVE METABOLIC PANEL
ALT: 19 IU/L (ref 0–32)
AST: 28 IU/L (ref 0–40)
Albumin/Globulin Ratio: 2.1 (ref 1.2–2.2)
Albumin: 4.4 g/dL (ref 3.7–4.7)
Alkaline Phosphatase: 85 IU/L (ref 44–121)
BUN/Creatinine Ratio: 16 (ref 12–28)
BUN: 15 mg/dL (ref 8–27)
Bilirubin Total: 0.5 mg/dL (ref 0.0–1.2)
CO2: 24 mmol/L (ref 20–29)
Calcium: 9.7 mg/dL (ref 8.7–10.3)
Chloride: 103 mmol/L (ref 96–106)
Creatinine, Ser: 0.91 mg/dL (ref 0.57–1.00)
Globulin, Total: 2.1 g/dL (ref 1.5–4.5)
Glucose: 100 mg/dL — ABNORMAL HIGH (ref 70–99)
Potassium: 4.1 mmol/L (ref 3.5–5.2)
Sodium: 141 mmol/L (ref 134–144)
Total Protein: 6.5 g/dL (ref 6.0–8.5)
eGFR: 66 mL/min/{1.73_m2} (ref >59)

## 2021-09-05 LAB — CBC
Hematocrit: 43.6 % (ref 34.0–46.6)
Hemoglobin: 14.7 g/dL (ref 11.1–15.9)
MCH: 31.5 pg (ref 26.6–33.0)
MCHC: 33.7 g/dL (ref 31.5–35.7)
MCV: 94 fL (ref 79–97)
Platelets: 216 10*3/uL (ref 150–450)
RBC: 4.66 x10E6/uL (ref 3.77–5.28)
RDW: 13.1 % (ref 11.7–15.4)
WBC: 5.5 10*3/uL (ref 3.4–10.8)

## 2021-09-05 LAB — MAGNESIUM: Magnesium: 2.2 mg/dL (ref 1.6–2.3)

## 2021-09-19 DIAGNOSIS — Z96642 Presence of left artificial hip joint: Secondary | ICD-10-CM | POA: Diagnosis not present

## 2021-09-26 ENCOUNTER — Encounter: Payer: Self-pay | Admitting: Cardiovascular Disease

## 2021-09-26 ENCOUNTER — Ambulatory Visit (INDEPENDENT_AMBULATORY_CARE_PROVIDER_SITE_OTHER): Payer: PPO | Admitting: Cardiovascular Disease

## 2021-09-26 VITALS — BP 100/62 | HR 69 | Ht 66.0 in | Wt 153.0 lb

## 2021-09-26 DIAGNOSIS — I480 Paroxysmal atrial fibrillation: Secondary | ICD-10-CM

## 2021-09-26 DIAGNOSIS — M359 Systemic involvement of connective tissue, unspecified: Secondary | ICD-10-CM | POA: Diagnosis not present

## 2021-09-26 DIAGNOSIS — Z7901 Long term (current) use of anticoagulants: Secondary | ICD-10-CM

## 2021-09-26 DIAGNOSIS — I440 Atrioventricular block, first degree: Secondary | ICD-10-CM | POA: Diagnosis not present

## 2021-09-26 DIAGNOSIS — R5382 Chronic fatigue, unspecified: Secondary | ICD-10-CM

## 2021-09-26 MED ORDER — METOPROLOL SUCCINATE ER 50 MG PO TB24: 50.0000 mg | ORAL_TABLET | Freq: Two times a day (BID) | ORAL | 1 refills | Status: DC

## 2021-09-26 NOTE — Progress Notes (Signed)
Patient ID: Lori [name], female   DOB: 1946-02-09, 76 y.o.   MRN: [id number] ? ? ? ? ? ? ?HPI: Lori [name] is a 76 year old female who is a former patient of Dr. Rollene Fare.  I last saw Lori [name] in June 2022. Lori [name] presents for follow-up evaluation. ? ?Lori [name] has a history of episodic palpitations in the past has been demonstrated to have a PACs with short runs of atrial tachycardia. Lori [name]  has a history of irritable bowel syndrome, chronic fatigue, hypothyroidism.  Lori [name] has had issues with bladder infections, and underwent bladder tacking surgery in July. Lori [name] sees Dr. Christena Deem of urology in [facility]. ? ?In 2012 an echo Doppler study showed mild concentric left ventricular hypertrophy with normal systolic and diastolic function. There was trivial mitral and tricuspid regurgitation. Lori [name] previously has had a normal Myoview scan in 2013 Lori [name] also had carotid studies which essentially were normal.  ? ?Lori [name] sees Dr. Sid Falcon of [place] frequently and is on numerous vitamin and supplement medications.Lori [name] does take numerous herbal type medications as well as vitamins.  Lori [name] states Lori [name] was told of having possible low adrenal function.  Lori [name] denies any recent episodes of chest pain. There are no episodes of presyncope or syncope. ? ?Since I last saw Lori [name] 15 months ago, Lori [name] has felt well from a cardiovascular standpoint.  Lori [name] does note an occasional palpitation but Lori [name] current dose of metoprolol 37.5 mg has controlled this most of the time.  At times Lori [name] has taken an extra 12.5 mg.  Lori [name] states Lori [name] has had autoimmune disease with mixed connective tissue disease, autoimmune hepatitis, rheumatoid arthritis, and lupus., hemachromatosis and undergoes periodic phlebotomy by Dr. Marin Olp.  Lori [name] states several family members has venous disease.  Time.  Lori [name] notes occasional cramping of Lori [name] legs at night.  Lori [name] denies exertional claudication symptoms.  Lori [name] denies varicose veins. ? ?Lori [name] denies any episodes of chest  pain.  Lori [name] notes mild shortness of breath when Lori [name] bends over.  There is some very mild shortness of breath with activity, which has not changed significantly.  Lori [name] has been undergoing blood work by Dr. Sid Falcon dose every 3 months and does plaque X treatments.  ? ?I saw Lori [name] in March 2019 and over the previous year Lori [name] had started therapy with protandum which Lori [name] says is a mitochondrial cellular regeneration product.  Lori [name] takes this for Lori [name] immunologic diseases which include lupus, RA, mixedconnective tissue disorder, autoimmune hepatitis, hemachromatosis.  Lori [name] feels significant improved since taking this medication.  Lori [name] palpitations had improved with metoprolol succinate and Lori [name] had self reduced Lori [name] dose down to 25 mg.  Lori [name] LDL cholesterol was elevated and Lori [name] could not take statin therapy. ? ?I saw Lori [name] in April 2021 and over the 2 prior years Lori [name] had done fairly well and was continuing to see  Dr. Sid Falcon every 3 months who checks laboratory each time.  Apparently there has recently been some concern for Hashimoto thyroiditis.  Lori [name] has been taking metoprolol succinate at 37.5 mg.  Lori [name] is also followed by Dr. Marin Olp for hemochromatosis and Lori [name] is heterozygous for the C282Y mutation.  Lori [name] undergoes phlebotomy as indicated to keep Lori [name] serum ferritin level less than 35.  Lori [name] denied chest pain.   ? ?When I last saw Lori [name] in June 2022 Lori [name] continued to feel well. Lori [name] underwent hip replacement in December 2021 and tolerated it well from a cardiac standpoint.  Lori [name] denies any recent chest pain development.  At times  Lori [name] notes rare palpitations.  Lori [name] has been on very low-dose dextroamphetamine which Lori [name] has been taking for some time and was originally placed on this due to very poor energy.  I discussed with Lori [name] that this potentially could exacerbate palpitations.  Lori [name] has been taking metoprolol 25 mg daily but at times has taken an extra pill if necessary for palpitations.  Lori [name] continues to be on thyroid replacement.   Due to Lori [name] immunologic diseases which by Lori [name] report include include lupus, RA, mixed connective tissue disorder, autoimmune hepatitis and hemochromatosis Lori [name] has continued to be on hydroxychloroquine.   ? ?Lori [name] was evaluated by Diona Browner, NP on July 26, 2021 with complaints of increased palpitations.  Lori [name] was on Toprol all succinate 37.5 mg.  Apparently, Lori [name] was referred for a Zio patch monitor which was done from March 3 through August 02, 2021.  This revealed predominant sinus rhythm with APCs, atrial couplets, atrial triplet, and atrial fibrillation with 20% burden was detected.  The rate ranged from 57 to 173 bpm with an average of 92 bpm.  The longest episode of atrial fibrillation lasted 13 hours and 1 minute with an average rate at 88.   ? ?I last saw Lori [name] in August 28, 2021 which time stated Lori [name] had felt better and stopped taking Lori [name].  Recently, the patient states Lori [name] has felt better and stopped taking Lori [name] metoprolol.  Lori [name] has continued to take  dextroamphetamine exedrine which Lori [name] had taken for fatigue but Lori [name] fatigue is less.  Lori [name] denied recent chest pain or shortness of breath.   During that evaluation, Lori [name] ECG showed probable atrial fibrillation with coarse fibrillatory waves.  At that time I recommended Lori [name] discontinue dextroamphetamine.  I initiated anticoagulation with Eliquis 5 mg twice a day and provided Lori [name] with samples.  I recommended resumption of metoprolol succinate 50 mg daily.  Also recommended Lori [name] have an echo Doppler study.  Lori [name] had recently undergone laboratory by Dr. Franchot Erichsen and TSH was normal at 2.2.  Lori [name] has history of hereditary hemochromatosis followed by Dr. Marin Olp with a goal to keep Lori [name] ferritin level less than 35 ? ?Currently, since I saw Lori [name], Lori [name] had stopped taking the dextroamphetamine for 2 weeks but apparently again resumed treatment since he felt Lori [name] was not as attentive cognitively.  Apparently Lori [name] has been taking metoprolol succinate 50 mg twice a day and resumed the  dextroamphetamine. ? ?Study done September 04, 2021 of 60 to 65% with grade 1 diastolic dysfunction.  There was mild aortic sclerosis without stenosis.  Ascending aorta measures 36 mm.  Lori [name] denies any chest pain.  Lori [name] is unaware of palpitations.  Lori [name] denies bleeding but was wondering if Lori [name] really needed to take Eliquis twice a day.  Lori [name] presents for reevaluation. ? ? ?Past Medical History:  ?Diagnosis Date  ? Autoimmune hepatitis ([place])   ? Cancer ([place]) 06/2017  ? SQUAMOS CELL REMOVED FROM FOREHEAD  ? Chronic fatigue   ? Complication of anesthesia   ? Partial Hysterectomy-Severe pain that radiated to esophogaus, and 12 more episode   ? Hemochromatosis   ? Hormone disorder   ? Hypothyroidism   ? IBS (irritable bowel syndrome)   ? Lupus ([place])   ? Mixed connective tissue disease ([place])   ? Osteoporosis 07/2017  ? T score -2.7 stable from prior DEXA  ? Palpitations   ? Rheumatoid arthritis ([place])   ? ? ?Past Surgical History:  ?Procedure Laterality Date  ? ABDOMINAL HYSTERECTOMY    ?  Partial; pt states has only had ovaries removed  ? ABDOMINAL SURGERY    ? ABDOMINOPLASTY  ? BLADDER SUSPENSION    ? BREAST SURGERY  1980  ? LUMPECTOMY X 2 FROM RIGHT BREAST  ? CARDIOVASCULAR STRESS TEST  08/28/2011  ? Normal  ? CAROTID DOPPLER  08/28/2011  ? Normal, no evidence of significant diameter reduction, dissection, or vascular abnormality  ? COLONOSCOPY W/ POLYPECTOMY  2018  ? IR IMAGING GUIDED PORT INSERTION  04/27/2020  ? IR RADIOLOGIST EVAL & MGMT  03/20/2021  ? LAPAROSCOPIC CHOLECYSTECTOMY    ? phlebotomies    ? TONSILLECTOMY    ? TONSILLECTOMY AND ADENOIDECTOMY    ? TRANSTHORACIC ECHOCARDIOGRAM  01/29/2011  ? EF >55%, mild concentric LVH  ? TUBAL LIGATION    ? ? ?Allergies  ?Allergen Reactions  ? Albumin (Human)   ?  Pt stated, "I am not allergic to this and don't know what this means"; 04/27/17  ? Amoxicillin Nausea And Vomiting  ? Codeine Nausea Only  ? Erythromycin Nausea Only and Other (See Comments)  ? Iodinated Contrast Media Other  (See Comments)  ? Keflex [Cephalexin] Itching  ? Meperidine   ?  Pt stated, "I don't know what this is"; 04/27/17  ? Metoclopramide Hcl   ? Other Other (See Comments)  ?  Beef ?Chicken ?Dairy ?Eggs ? ?Pt state

## 2021-09-26 NOTE — Patient Instructions (Signed)
Medication Instructions:  ?Take Metoprolol- if your HR becomes too low you can take 50 mg in the morning and 25 mg at night.  ? ?*If you need a refill on your cardiac medications before your next appointment, please call your pharmacy* ? ?Follow-Up: ?At [facility], you and your health needs are our priority.  As part of our continuing mission to provide you with exceptional heart care, we have created designated Provider Care Teams.  These Care Teams include your primary Cardiologist (physician) and Advanced Practice Providers (APPs -  Physician Assistants and Nurse Practitioners) who all work together to provide you with the care you need, when you need it. ? ?We recommend signing up for the patient portal called "MyChart".  Sign up information is provided on this After Visit Summary.  MyChart is used to connect with patients for Virtual Visits (Telemedicine).  Patients are able to view lab/test results, encounter notes, upcoming appointments, etc.  Non-urgent messages can be sent to your provider as well.   ?To learn more about what you can do with MyChart, go to [email or web address].   ? ?Your next appointment:   ?6 month(s) ? ?The format for your next appointment:   ?In Person ? ?Provider:   ?Shelva Majestic, MD   ? ? ? ? ? ? ? ?

## 2021-10-10 DIAGNOSIS — R8279 Other abnormal findings on microbiological examination of urine: Secondary | ICD-10-CM | POA: Diagnosis not present

## 2021-10-10 DIAGNOSIS — R82998 Other abnormal findings in urine: Secondary | ICD-10-CM | POA: Diagnosis not present

## 2021-10-10 DIAGNOSIS — R300 Dysuria: Secondary | ICD-10-CM | POA: Diagnosis not present

## 2021-10-22 ENCOUNTER — Telehealth: Payer: Self-pay | Admitting: Cardiovascular Disease

## 2021-10-22 NOTE — Telephone Encounter (Signed)
Pt c/o medication issue:  1. Name of Medication: apixaban (ELIQUIS) 5 MG TABS tablet  2. How are you currently taking this medication (dosage and times per day)? Take 1 tablet (5 mg total) by mouth 2 (two) times daily.  3. Are you having a reaction (difficulty breathing--STAT)? no  4. What is your medication issue? Patient calling to see if our office had any samples.

## 2021-10-22 NOTE — Telephone Encounter (Signed)
Yes, ok to give Eliquis '5mg'$  samples plus patient assistance application

## 2021-10-23 DIAGNOSIS — M810 Age-related osteoporosis without current pathological fracture: Secondary | ICD-10-CM | POA: Diagnosis not present

## 2021-10-23 DIAGNOSIS — E039 Hypothyroidism, unspecified: Secondary | ICD-10-CM | POA: Diagnosis not present

## 2021-10-23 DIAGNOSIS — R5382 Chronic fatigue, unspecified: Secondary | ICD-10-CM | POA: Diagnosis not present

## 2021-10-28 ENCOUNTER — Telehealth: Payer: Self-pay | Admitting: Cardiology

## 2021-10-28 NOTE — Telephone Encounter (Signed)
Received a phone call from patient this morning complaining of three beats of chest pain and chest pressure. Patient was waking up from sleep to use the bathroom around 1-2 AM this morning, noticed that she had 3 beats of chest pain that were relieved with changing her position. Also noticed some chest pressure that felt somewhat like indigestion. At this time, patient denies having any chest pain, but has continued to have chest pressure. As patient has had ongoing chest pressure for a few hours now, I instructed her to go to the [facility] for evaluation.   Margie Billet, PA-C 10/28/2021 6:32 AM

## 2021-10-30 NOTE — Telephone Encounter (Signed)
Patient assistance was placed up front for patient- Thanks!

## 2021-11-04 ENCOUNTER — Telehealth: Payer: Self-pay | Admitting: *Deleted

## 2021-11-04 DIAGNOSIS — E063 Autoimmune thyroiditis: Secondary | ICD-10-CM | POA: Diagnosis not present

## 2021-11-04 DIAGNOSIS — E039 Hypothyroidism, unspecified: Secondary | ICD-10-CM | POA: Diagnosis not present

## 2021-11-04 NOTE — Telephone Encounter (Signed)
Patient called wanting to let us know that she was diagnosed with Afib recently and placed on Eliquis 5 mg.  Patient wanted to know if there was any relationship between Eliquis and hemochromatosis. Dr Marin Olp notified of this question.  No relationship per Dr Marin Olp.  Patient expressed concerns about bleeding.  Encouraged patient to call her Cardiologist who prescribed it and ask him about dosage and her worry about bleeding more.  Patient understands and appreciates call

## 2021-11-05 ENCOUNTER — Telehealth: Payer: Self-pay | Admitting: *Deleted

## 2021-11-05 NOTE — Telephone Encounter (Signed)
Patient dropped off patient assistance forms for eliquis. The provider information is filled out with all of dr Raul Del info. Let message for pharm D to call, are we doing this paperwork or are they? The patient does not know who is supposed to do the paperwork.

## 2021-11-12 DIAGNOSIS — J310 Chronic rhinitis: Secondary | ICD-10-CM | POA: Diagnosis not present

## 2021-11-19 ENCOUNTER — Other Ambulatory Visit: Payer: Self-pay | Admitting: Cardiovascular Disease

## 2021-11-21 MED ORDER — METOPROLOL SUCCINATE ER 25 MG PO TB24: ORAL_TABLET | ORAL | 6 refills | Status: DC

## 2021-11-21 NOTE — Telephone Encounter (Signed)
Spoke to patient's husband and patient.Husband stated he was calling to check on Eliquis patient assistance forms he brought to office a couple of weeks ago.Advised Dr.Kelly's nurse is out of office this week.I will send message to her.  Patient stated she calls office to ask questions and we never return her calls.Advised no documentation of her calls.Stated she has several questions to ask Dr.Kelly  1- She wants to decrease Eliquis dose to 2.5 mg when she takes a pain pill.Stated she cannot take Tylenol she has liver disease.Advised only [place] Advil as needed.  2-She wants Metoprolol 25 mg tablet to have for palpitations instead of breaking 50 mg in half.  3- She wants to know if any of her medication would be causing her right eye to twitch No one can tell her why her right eye twitches since she started taking Eliquis.  Advised she needs to take Eliquis 5 mg twice a day.Do not decrease dose.I will send in Metoprolol Succ 25 mg tablet to take twice a day only if needed for palpitations I will send message to [facility] for review and advice.

## 2021-11-21 NOTE — Telephone Encounter (Signed)
Tried to return husband, left detailed message that we have not heard back from Dr Raul Del office. Will call again tomorrow

## 2021-11-21 NOTE — Telephone Encounter (Signed)
Husband called to follow-up on paperwork to have medication assistance.  He would like to know if he needs to collect the completed document.  He is concerned no one has called his wife back regarding this.  Please advise.

## 2021-11-22 DIAGNOSIS — E039 Hypothyroidism, unspecified: Secondary | ICD-10-CM | POA: Diagnosis not present

## 2021-11-22 DIAGNOSIS — M810 Age-related osteoporosis without current pathological fracture: Secondary | ICD-10-CM | POA: Diagnosis not present

## 2021-12-02 NOTE — Telephone Encounter (Signed)
Agree with recommendations; She has normal renal fxn and eliquis 5 mg bid is the dose for adequate anticoagulation

## 2021-12-03 NOTE — Telephone Encounter (Signed)
Noted. Thanks.

## 2021-12-11 DIAGNOSIS — L989 Disorder of the skin and subcutaneous tissue, unspecified: Secondary | ICD-10-CM | POA: Diagnosis not present

## 2021-12-11 DIAGNOSIS — J302 Other seasonal allergic rhinitis: Secondary | ICD-10-CM | POA: Diagnosis not present

## 2021-12-17 ENCOUNTER — Inpatient Hospital Stay: Payer: PPO | Attending: Hematology & Oncology

## 2021-12-17 ENCOUNTER — Inpatient Hospital Stay (HOSPITAL_BASED_OUTPATIENT_CLINIC_OR_DEPARTMENT_OTHER): Payer: PPO | Admitting: Family

## 2021-12-17 ENCOUNTER — Inpatient Hospital Stay: Payer: PPO

## 2021-12-17 ENCOUNTER — Encounter: Payer: Self-pay | Admitting: Family

## 2021-12-17 DIAGNOSIS — Z95828 Presence of other vascular implants and grafts: Secondary | ICD-10-CM

## 2021-12-17 LAB — CMP (CANCER CENTER ONLY)
ALT: 22 U/L (ref 0–44)
AST: 27 U/L (ref 15–41)
Albumin: 4.5 g/dL (ref 3.5–5.0)
Alkaline Phosphatase: 66 U/L (ref 38–126)
Anion gap: 8 (ref 5–15)
BUN: 18 mg/dL (ref 8–23)
CO2: 25 mmol/L (ref 22–32)
Calcium: 9.7 mg/dL (ref 8.9–10.3)
Chloride: 101 mmol/L (ref 98–111)
Creatinine: 0.81 mg/dL (ref 0.44–1.00)
GFR, Estimated: >60 mL/min (ref >60)
Glucose, Bld: 94 mg/dL (ref 70–99)
Potassium: 4.3 mmol/L (ref 3.5–5.1)
Sodium: 134 mmol/L — ABNORMAL LOW (ref 135–145)
Total Bilirubin: 0.6 mg/dL (ref 0.3–1.2)
Total Protein: 6.6 g/dL (ref 6.5–8.1)

## 2021-12-17 LAB — CBC WITH DIFFERENTIAL (CANCER CENTER ONLY)
Abs Immature Granulocytes: 0.07 10*3/uL (ref 0.00–0.07)
Basophils Absolute: 0 10*3/uL (ref 0.0–0.1)
Basophils Relative: 0 %
Eosinophils Absolute: 0.1 10*3/uL (ref 0.0–0.5)
Eosinophils Relative: 1 %
HCT: 41.8 % (ref 36.0–46.0)
Hemoglobin: 14.3 g/dL (ref 12.0–15.0)
Immature Granulocytes: 1 %
Lymphocytes Relative: 40 %
Lymphs Abs: 2.7 10*3/uL (ref 0.7–4.0)
MCH: 33.3 pg (ref 26.0–34.0)
MCHC: 34.2 g/dL (ref 30.0–36.0)
MCV: 97.4 fL (ref 80.0–100.0)
Monocytes Absolute: 0.7 10*3/uL (ref 0.1–1.0)
Monocytes Relative: 10 %
Neutro Abs: 3.2 10*3/uL (ref 1.7–7.7)
Neutrophils Relative %: 48 %
Platelet Count: 204 10*3/uL (ref 150–400)
RBC: 4.29 MIL/uL (ref 3.87–5.11)
RDW: 12.1 % (ref 11.5–15.5)
WBC Count: 6.8 10*3/uL (ref 4.0–10.5)
nRBC: 0 % (ref 0.0–0.2)

## 2021-12-17 LAB — RETICULOCYTES
Immature Retic Fract: 7.7 % (ref 2.3–15.9)
RBC.: 4.32 MIL/uL (ref 3.87–5.11)
Retic Count, Absolute: 88.6 10*3/uL (ref 19.0–186.0)
Retic Ct Pct: 2.1 % (ref 0.4–3.1)

## 2021-12-17 LAB — FERRITIN: Ferritin: 35 ng/mL (ref 11–307)

## 2021-12-17 MED ORDER — SODIUM CHLORIDE 0.9% FLUSH
10.0000 mL | Freq: Once | INTRAVENOUS | Status: AC
  Administered 2021-12-17: 10 mL via INTRAVENOUS

## 2021-12-17 MED ORDER — HEPARIN SOD (PORK) LOCK FLUSH 100 UNIT/ML IV SOLN
500.0000 [IU] | Freq: Once | INTRAVENOUS | Status: AC
  Administered 2021-12-17: 500 [IU] via INTRAVENOUS

## 2021-12-17 NOTE — Progress Notes (Signed)
Hematology and Oncology Follow Up Visit  Lori [name] [id number] Jul 19, 1945 76 y.o. 12/17/2021   Principle Diagnosis:  Hemochromatosis - heterozygous for the [id number] mutation   Current Therapy:        Phlebotomy as indicated to keep ferritin < 35  (changed per patient request 06/24/17)   Interim History:  Lori [name] is here today for follow-up. Iron studies are pending.  She is doing well and has started taking a copper supplement recently to help regulate her iron levels.  She was also diagnosed with atrial fib and is currently taking Metoprolol and Eliquis.  No issues with blood loss. No abnormal bruising, no petechiae.  She does note occasional palpitations.  No fever, chills, n/v, cough, rash, chest pain, abdominal pain or changes in bowel or bladder habits.  No swelling in her extremities.  She does have intermittent tingling and burning under her bra line and in both lower extremities. This comes and goes. She is considering seeing neurology.  No falls or syncope.  Appetite and hydration are good. Weight is stable at 154 lbs.   ECOG Performance Status: 1 - Symptomatic but completely ambulatory  Medications:  Allergies as of 12/17/2021       Reactions   Albumin (human)    Pt stated, "I am not allergic to this and don't know what this means"; 04/27/17   Amoxicillin Nausea And Vomiting   Codeine Nausea Only   Erythromycin Nausea Only, Other (See Comments)   Iodinated Contrast Media Other (See Comments)   Keflex [cephalexin] Itching   Meperidine    Pt stated, "I don't know what this is"; 04/27/17   Metoclopramide Hcl    Other Other (See Comments)   Beef Chicken Dairy Eggs Pt stated, "My body can't process protein; it makes me sick"   Percocet [oxycodone-acetaminophen]    Made pt sick    Shellfish Allergy Other (See Comments)   Pt stated, "I am not allergic to this anymore"; 04/27/17 Pt stated, "I am not allergic to this anymore"; 04/27/17   Shellfish-derived  Products    Oxycodone Nausea And Vomiting   Pt stated, "I do not know if I am allergic to this"; 04/27/17        Medication List        Accurate as of December 17, 2021  1:50 PM. If you have any questions, ask your nurse or doctor.          amiloride-hydrochlorothiazide 5-50 MG tablet Commonly known as: MODURETIC Take 0.5 tablets by mouth daily. As Needed   apixaban 5 MG Tabs tablet Commonly known as: ELIQUIS Take 1 tablet (5 mg total) by mouth 2 (two) times daily.   apixaban 5 MG Tabs tablet Commonly known as: ELIQUIS Take 1 tablet (5 mg total) by mouth 2 (two) times daily.   fish oil-omega-3 fatty acids 1000 MG capsule Take 1 g by mouth daily.   fluconazole 200 MG tablet Commonly known as: DIFLUCAN Take 100 mg by mouth daily as needed (yeast).   MELATONIN PO Take 2 mg by mouth at bedtime.   metoprolol succinate 50 MG 24 hr tablet Commonly known as: TOPROL-XL Take 1 tablet (50 mg total) by mouth 2 (two) times daily. Take with or immediately following a meal.   metoprolol succinate 25 MG 24 hr tablet Commonly known as: Toprol XL Take 25 mg twice a day only if needed for palpitations   MULTIVITAMIN PO Take by mouth.   nystatin 500000 units Tabs tablet Commonly known as:  MYCOSTATIN Take 1 tablet by mouth at bedtime.   potassium chloride SA 20 MEQ tablet Commonly known as: KLOR-CON M Take 20 mEq by mouth daily.   PRASTERONE (DHEA) PO Take 28 mg by mouth daily.   PROBIOTIC DAILY PO Take 1 capsule by mouth daily.   progesterone 100 MG capsule Commonly known as: PROMETRIUM Take 100 mg by mouth daily.   thyroid 60 MG tablet Commonly known as: ARMOUR Take 60 mg by mouth daily before breakfast.   thyroid 15 MG tablet Commonly known as: ARMOUR Take 15 mg by mouth 3 (three) times a week.   vitamin B-12 1000 MCG tablet Commonly known as: CYANOCOBALAMIN Take 1,000 mcg by mouth daily.   vitamin C 1000 MG tablet Take 3,000 mg by mouth daily.   VITAMIN D  PO Take 1 tablet by mouth daily.   zinc gluconate 50 MG tablet Take 50 mg by mouth daily.        Allergies:  Allergies  Allergen Reactions   Albumin (Human)     Pt stated, "I am not allergic to this and don't know what this means"; 04/27/17   Amoxicillin Nausea And Vomiting   Codeine Nausea Only   Erythromycin Nausea Only and Other (See Comments)   Iodinated Contrast Media Other (See Comments)   Keflex [Cephalexin] Itching   Meperidine     Pt stated, "I don't know what this is"; 04/27/17   Metoclopramide Hcl    Other Other (See Comments)    Beef Chicken Dairy Eggs  Pt stated, "My body can't process protein; it makes me sick"   Percocet [Oxycodone-Acetaminophen]     Made pt sick    Shellfish Allergy Other (See Comments)    Pt stated, "I am not allergic to this anymore"; 04/27/17 Pt stated, "I am not allergic to this anymore"; 04/27/17   Shellfish-Derived Products    Oxycodone Nausea And Vomiting    Pt stated, "I do not know if I am allergic to this"; 04/27/17    Past Medical History, Surgical history, Social history, and Family History were reviewed and updated.  Review of Systems: All other 10 point review of systems is negative.   Physical Exam:  vitals were not taken for this visit.   Wt Readings from Last 3 Encounters:  09/26/21 153 lb (69.4 kg)  08/28/21 152 lb 3.2 oz (69 kg)  07/26/21 154 lb 1.6 oz (69.9 kg)    Ocular: Sclerae unicteric, pupils equal, round and reactive to light Ear-nose-throat: Oropharynx clear, dentition fair Lymphatic: No cervical or supraclavicular adenopathy Lungs no rales or rhonchi, good excursion bilaterally Heart regular rate and rhythm, no murmur appreciated Abd soft, nontender, positive bowel sounds MSK no focal spinal tenderness, no joint edema Neuro: non-focal, well-oriented, appropriate affect Breasts: Deferred   Lab Results  Component Value Date   WBC 6.8 12/17/2021   HGB 14.3 12/17/2021   HCT 41.8 12/17/2021   MCV  97.4 12/17/2021   PLT 204 12/17/2021   Lab Results  Component Value Date   FERRITIN 22 05/02/2021   IRON 114 05/02/2021   TIBC 256 05/02/2021   UIBC 142 05/02/2021   IRONPCTSAT 45 05/02/2021   Lab Results  Component Value Date   RETICCTPCT 2.1 12/17/2021   RBC 4.32 12/17/2021   No results found for: "KPAFRELGTCHN", "LAMBDASER", "KAPLAMBRATIO" No results found for: "IGGSERUM", "IGA", "IGMSERUM" No results found for: "TOTALPROTELP", "ALBUMINELP", "A1GS", "A2GS", "BETS", "BETA2SER", "GAMS", "MSPIKE", "SPEI"   Chemistry      Component Value Date/Time  NA 141 09/04/2021 0844   NA 143 03/11/2017 1404   NA 139 07/02/2016 1139   K 4.1 09/04/2021 0844   K 3.9 03/11/2017 1404   K 4.1 07/02/2016 1139   CL 103 09/04/2021 0844   CL 105 03/11/2017 1404   CO2 24 09/04/2021 0844   CO2 31 03/11/2017 1404   CO2 26 07/02/2016 1139   BUN 15 09/04/2021 0844   BUN 15 03/11/2017 1404   BUN 20.3 07/02/2016 1139   CREATININE 0.91 09/04/2021 0844   CREATININE 0.84 05/02/2021 1332   CREATININE 0.7 03/11/2017 1404   CREATININE 0.8 07/02/2016 1139      Component Value Date/Time   CALCIUM 9.7 09/04/2021 0844   CALCIUM 9.4 03/11/2017 1404   CALCIUM 10.0 07/02/2016 1139   ALKPHOS 85 09/04/2021 0844   ALKPHOS 69 03/11/2017 1404   ALKPHOS 87 07/02/2016 1139   AST 28 09/04/2021 0844   AST 25 05/02/2021 1332   AST 27 07/02/2016 1139   ALT 19 09/04/2021 0844   ALT 18 05/02/2021 1332   ALT 32 03/11/2017 1404   ALT 34 07/02/2016 1139   BILITOT 0.5 09/04/2021 0844   BILITOT 0.5 05/02/2021 1332   BILITOT 0.55 07/02/2016 1139       Impression and Plan: Ms. Mcmurry is a pleasant 76 yo caucasian female with hemochromatosis, heterozygous for the C282Y mutation.  Iron studies are pending. We will set her up for phlebotomy if needed.  Lab check every 3 months and follow-up in 1 year.   Lottie Dawson, NP 7/25/20231:50 PM

## 2021-12-17 NOTE — Patient Instructions (Signed)

## 2021-12-18 LAB — IRON AND IRON BINDING CAPACITY (CC-WL,HP ONLY)
Iron: 242 ug/dL — ABNORMAL HIGH (ref 28–170)
Saturation Ratios: 97 % — ABNORMAL HIGH (ref 10.4–31.8)
TIBC: 251 ug/dL (ref 250–450)
UIBC: 9 ug/dL — ABNORMAL LOW (ref 148–442)

## 2021-12-23 DIAGNOSIS — E039 Hypothyroidism, unspecified: Secondary | ICD-10-CM | POA: Diagnosis not present

## 2021-12-23 DIAGNOSIS — M810 Age-related osteoporosis without current pathological fracture: Secondary | ICD-10-CM | POA: Diagnosis not present

## 2021-12-24 DIAGNOSIS — M542 Cervicalgia: Secondary | ICD-10-CM | POA: Diagnosis not present

## 2021-12-24 DIAGNOSIS — M5451 Vertebrogenic low back pain: Secondary | ICD-10-CM | POA: Diagnosis not present

## 2021-12-26 DIAGNOSIS — M5451 Vertebrogenic low back pain: Secondary | ICD-10-CM | POA: Diagnosis not present

## 2021-12-26 DIAGNOSIS — M542 Cervicalgia: Secondary | ICD-10-CM | POA: Diagnosis not present

## 2022-03-17 DIAGNOSIS — Z148 Genetic carrier of other disease: Secondary | ICD-10-CM | POA: Diagnosis not present

## 2022-03-17 DIAGNOSIS — L508 Other urticaria: Secondary | ICD-10-CM | POA: Diagnosis not present

## 2022-03-17 DIAGNOSIS — L282 Other prurigo: Secondary | ICD-10-CM | POA: Diagnosis not present

## 2022-03-17 DIAGNOSIS — M351 Other overlap syndromes: Secondary | ICD-10-CM | POA: Diagnosis not present

## 2022-03-17 DIAGNOSIS — J302 Other seasonal allergic rhinitis: Secondary | ICD-10-CM | POA: Diagnosis not present

## 2022-03-19 ENCOUNTER — Inpatient Hospital Stay: Payer: PPO

## 2022-03-19 ENCOUNTER — Inpatient Hospital Stay: Payer: PPO | Attending: Hematology & Oncology

## 2022-03-19 DIAGNOSIS — N390 Urinary tract infection, site not specified: Secondary | ICD-10-CM | POA: Diagnosis not present

## 2022-03-19 DIAGNOSIS — Z95828 Presence of other vascular implants and grafts: Secondary | ICD-10-CM

## 2022-03-19 DIAGNOSIS — R399 Unspecified symptoms and signs involving the genitourinary system: Secondary | ICD-10-CM | POA: Diagnosis not present

## 2022-03-19 DIAGNOSIS — E785 Hyperlipidemia, unspecified: Secondary | ICD-10-CM | POA: Diagnosis not present

## 2022-03-19 DIAGNOSIS — M810 Age-related osteoporosis without current pathological fracture: Secondary | ICD-10-CM | POA: Diagnosis not present

## 2022-03-19 DIAGNOSIS — R7301 Impaired fasting glucose: Secondary | ICD-10-CM | POA: Diagnosis not present

## 2022-03-19 DIAGNOSIS — R7989 Other specified abnormal findings of blood chemistry: Secondary | ICD-10-CM | POA: Diagnosis not present

## 2022-03-19 DIAGNOSIS — R5382 Chronic fatigue, unspecified: Secondary | ICD-10-CM | POA: Diagnosis not present

## 2022-03-19 DIAGNOSIS — E039 Hypothyroidism, unspecified: Secondary | ICD-10-CM | POA: Diagnosis not present

## 2022-03-19 LAB — CMP (CANCER CENTER ONLY)
ALT: 26 U/L (ref 0–44)
AST: 25 U/L (ref 15–41)
Albumin: 4.8 g/dL (ref 3.5–5.0)
Alkaline Phosphatase: 72 U/L (ref 38–126)
Anion gap: 11 (ref 5–15)
BUN: 14 mg/dL (ref 8–23)
CO2: 26 mmol/L (ref 22–32)
Calcium: 10.6 mg/dL — ABNORMAL HIGH (ref 8.9–10.3)
Chloride: 102 mmol/L (ref 98–111)
Creatinine: 0.75 mg/dL (ref 0.44–1.00)
GFR, Estimated: >60 mL/min (ref >60)
Glucose, Bld: 91 mg/dL (ref 70–99)
Potassium: 3.9 mmol/L (ref 3.5–5.1)
Sodium: 139 mmol/L (ref 135–145)
Total Bilirubin: 0.8 mg/dL (ref 0.3–1.2)
Total Protein: 7.4 g/dL (ref 6.5–8.1)

## 2022-03-19 LAB — CBC WITH DIFFERENTIAL (CANCER CENTER ONLY)
Abs Immature Granulocytes: 0.03 K/uL (ref 0.00–0.07)
Basophils Absolute: 0 K/uL (ref 0.0–0.1)
Basophils Relative: 0 %
Eosinophils Absolute: 0.1 K/uL (ref 0.0–0.5)
Eosinophils Relative: 1 %
HCT: 43.2 % (ref 36.0–46.0)
Hemoglobin: 14.7 g/dL (ref 12.0–15.0)
Immature Granulocytes: 0 %
Lymphocytes Relative: 31 %
Lymphs Abs: 3.4 K/uL (ref 0.7–4.0)
MCH: 33 pg (ref 26.0–34.0)
MCHC: 34 g/dL (ref 30.0–36.0)
MCV: 97.1 fL (ref 80.0–100.0)
Monocytes Absolute: 0.7 K/uL (ref 0.1–1.0)
Monocytes Relative: 6 %
Neutro Abs: 6.8 K/uL (ref 1.7–7.7)
Neutrophils Relative %: 62 %
Platelet Count: 225 K/uL (ref 150–400)
RBC: 4.45 MIL/uL (ref 3.87–5.11)
RDW: 12.4 % (ref 11.5–15.5)
WBC Count: 11.1 K/uL — ABNORMAL HIGH (ref 4.0–10.5)
nRBC: 0 % (ref 0.0–0.2)

## 2022-03-19 LAB — FERRITIN: Ferritin: 65 ng/mL (ref 11–307)

## 2022-03-19 MED ORDER — SODIUM CHLORIDE 0.9% FLUSH
10.0000 mL | Freq: Once | INTRAVENOUS | Status: AC
  Administered 2022-03-19: 10 mL via INTRAVENOUS

## 2022-03-19 MED ORDER — HEPARIN SOD (PORK) LOCK FLUSH 100 UNIT/ML IV SOLN
500.0000 [IU] | Freq: Once | INTRAVENOUS | Status: AC
  Administered 2022-03-19: 500 [IU] via INTRAVENOUS

## 2022-03-19 NOTE — Patient Instructions (Signed)

## 2022-03-20 LAB — IRON AND IRON BINDING CAPACITY (CC-WL,HP ONLY)
Iron: 122 ug/dL (ref 28–170)
Saturation Ratios: 42 % — ABNORMAL HIGH (ref 10.4–31.8)
TIBC: 293 ug/dL (ref 250–450)
UIBC: 171 ug/dL (ref 148–442)

## 2022-03-24 ENCOUNTER — Ambulatory Visit (INDEPENDENT_AMBULATORY_CARE_PROVIDER_SITE_OTHER): Payer: PPO | Admitting: Obstetrics & Gynecology

## 2022-03-24 DIAGNOSIS — Z91199 Patient's noncompliance with other medical treatment and regimen due to unspecified reason: Secondary | ICD-10-CM

## 2022-03-25 NOTE — Progress Notes (Signed)
Patient came, but worried about her appointment not being covered by Medicare, so refused to be seen.

## 2022-03-26 DIAGNOSIS — E039 Hypothyroidism, unspecified: Secondary | ICD-10-CM | POA: Diagnosis not present

## 2022-03-26 DIAGNOSIS — N390 Urinary tract infection, site not specified: Secondary | ICD-10-CM | POA: Diagnosis not present

## 2022-03-26 DIAGNOSIS — Z148 Genetic carrier of other disease: Secondary | ICD-10-CM | POA: Diagnosis not present

## 2022-03-26 DIAGNOSIS — I480 Paroxysmal atrial fibrillation: Secondary | ICD-10-CM | POA: Diagnosis not present

## 2022-03-26 DIAGNOSIS — D6869 Other thrombophilia: Secondary | ICD-10-CM | POA: Diagnosis not present

## 2022-03-26 DIAGNOSIS — Z800 Family history of malignant neoplasm of digestive organs: Secondary | ICD-10-CM | POA: Diagnosis not present

## 2022-03-26 DIAGNOSIS — Z1331 Encounter for screening for depression: Secondary | ICD-10-CM | POA: Diagnosis not present

## 2022-03-26 DIAGNOSIS — Z1339 Encounter for screening examination for other mental health and behavioral disorders: Secondary | ICD-10-CM | POA: Diagnosis not present

## 2022-03-26 DIAGNOSIS — M351 Other overlap syndromes: Secondary | ICD-10-CM | POA: Diagnosis not present

## 2022-03-26 DIAGNOSIS — D692 Other nonthrombocytopenic purpura: Secondary | ICD-10-CM | POA: Diagnosis not present

## 2022-03-26 DIAGNOSIS — M810 Age-related osteoporosis without current pathological fracture: Secondary | ICD-10-CM | POA: Diagnosis not present

## 2022-03-26 DIAGNOSIS — Z0000 Encounter for general adult medical examination without abnormal findings: Secondary | ICD-10-CM | POA: Diagnosis not present

## 2022-04-03 ENCOUNTER — Ambulatory Visit: Payer: PPO | Attending: Cardiovascular Disease | Admitting: Cardiovascular Disease

## 2022-04-03 ENCOUNTER — Encounter: Payer: Self-pay | Admitting: Cardiovascular Disease

## 2022-04-03 VITALS — BP 100/61 | HR 71 | Ht 66.0 in | Wt 150.0 lb

## 2022-04-03 DIAGNOSIS — R5382 Chronic fatigue, unspecified: Secondary | ICD-10-CM

## 2022-04-03 DIAGNOSIS — Z5181 Encounter for therapeutic drug level monitoring: Secondary | ICD-10-CM

## 2022-04-03 DIAGNOSIS — M359 Systemic involvement of connective tissue, unspecified: Secondary | ICD-10-CM

## 2022-04-03 DIAGNOSIS — Z7901 Long term (current) use of anticoagulants: Secondary | ICD-10-CM | POA: Diagnosis not present

## 2022-04-03 DIAGNOSIS — I440 Atrioventricular block, first degree: Secondary | ICD-10-CM | POA: Diagnosis not present

## 2022-04-03 DIAGNOSIS — I480 Paroxysmal atrial fibrillation: Secondary | ICD-10-CM | POA: Diagnosis not present

## 2022-04-03 NOTE — Patient Instructions (Signed)
Medication Instructions:  No changes   *If you need a refill on your cardiac medications before your next appointment, please call your pharmacy*   Lab Work: Not needed   Testing/Procedures: Not needed   Follow-Up: At [facility], you and your health needs are our priority.  As part of our continuing mission to provide you with exceptional heart care, we have created designated Provider Care Teams.  These Care Teams include your primary Cardiologist (physician) and Advanced Practice Providers (APPs -  Physician Assistants and Nurse Practitioners) who all work together to provide you with the care you need, when you need it.     Your next appointment:   12 month(s)  The format for your next appointment:   In Person  Provider:   Thomas Kelly, MD     

## 2022-04-03 NOTE — Progress Notes (Signed)
Patient ID: Lori [name], female   DOB: [birth year], 76 y.o.   MRN: [id number]        HPI: Ms. Casco is a 76 year old female who is a former patient of Dr. Rollene Fare.  I last saw her in May 2023. She presents for follow-up evaluation.  Ms. Izzo has a history of episodic palpitations in the past has been demonstrated to have a PACs with short runs of atrial tachycardia. She  has a history of irritable bowel syndrome, chronic fatigue, hypothyroidism.  She has had issues with bladder infections, and underwent bladder tacking surgery in July. She sees Dr. Christena Deem of urology in [facility].  In 2012 an echo Doppler study showed mild concentric left ventricular hypertrophy with normal systolic and diastolic function. There was trivial mitral and tricuspid regurgitation. She previously has had a normal Myoview scan in 2013 she also had carotid studies which essentially were normal.   She sees Dr. Sid Falcon of [place] frequently and is on numerous vitamin and supplement medications.She does take numerous herbal type medications as well as vitamins.  She states she was told of having possible low adrenal function.  She denies any recent episodes of chest pain. There are no episodes of presyncope or syncope.  Since I last saw her 15 months ago, she has felt well from a cardiovascular standpoint.  She does note an occasional palpitation but her current dose of metoprolol 37.5 mg has controlled this most of the time.  At times she has taken an extra 12.5 mg.  She states she has had autoimmune disease with mixed connective tissue disease, autoimmune hepatitis, rheumatoid arthritis, and lupus., hemachromatosis and undergoes periodic phlebotomy by Dr. Marin Olp.  She states several family members has venous disease.  Time.  She notes occasional cramping of her legs at night.  She denies exertional claudication symptoms.  She denies varicose veins.  She denies any episodes of chest  pain.  She notes mild shortness of breath when she bends over.  There is some very mild shortness of breath with activity, which has not changed significantly.  She has been undergoing blood work by Dr. Sid Falcon dose every 3 months and does plaque X treatments.   I saw her in March 2019 and over the previous year she had started therapy with protandum which she says is a mitochondrial cellular regeneration product.  She takes this for her immunologic diseases which include lupus, RA, mixedconnective tissue disorder, autoimmune hepatitis, hemachromatosis.  She feels significant improved since taking this medication.  Her palpitations had improved with metoprolol succinate and she had self reduced her dose down to 25 mg.  Her LDL cholesterol was elevated and she could not take statin therapy.  I saw her in April 2021 and over the 2 prior years she had done fairly well and was continuing to see  Dr. Sid Falcon every 3 months who checks laboratory each time.  Apparently there has recently been some concern for Hashimoto thyroiditis.  She has been taking metoprolol succinate at 37.5 mg.  She is also followed by Dr. Marin Olp for hemochromatosis and she is heterozygous for the C282Y mutation.  She undergoes phlebotomy as indicated to keep her serum ferritin level less than 35.  She denied chest pain.    When I last saw her in June 2022 she continued to feel well. She underwent hip replacement in December 2021 and tolerated it well from a cardiac standpoint.  She denies any recent chest pain development.  At  times she notes rare palpitations.  She has been on very low-dose dextroamphetamine which she has been taking for some time and was originally placed on this due to very poor energy.  I discussed with her that this potentially could exacerbate palpitations.  She has been taking metoprolol 25 mg daily but at times has taken an extra pill if necessary for palpitations.  She continues to be on thyroid replacement.   Due to her immunologic diseases which by her report include include lupus, RA, mixed connective tissue disorder, autoimmune hepatitis and hemochromatosis she has continued to be on hydroxychloroquine.    She was evaluated by Diona Browner, NP on July 26, 2021 with complaints of increased palpitations.  She was on Toprol all succinate 37.5 mg.  Apparently, she was referred for a Zio patch monitor which was done from March 3 through August 02, 2021.  This revealed predominant sinus rhythm with APCs, atrial couplets, atrial triplet, and atrial fibrillation with 20% burden was detected.  The rate ranged from 57 to 173 bpm with an average of 92 bpm.  The longest episode of atrial fibrillation lasted 13 hours and 1 minute with an average rate at 88.    I saw her in August 28, 2021 which time stated she had felt better and stopped taking her.  Recently, the patient states she has felt better and stopped taking her metoprolol.  She has continued to take  dextroamphetamine exedrine which she had taken for fatigue but her fatigue is less.  She denied recent chest pain or shortness of breath.   During that evaluation, her ECG showed probable atrial fibrillation with coarse fibrillatory waves.  At that time I recommended she discontinue dextroamphetamine.  I initiated anticoagulation with Eliquis 5 mg twice a day and provided her with samples.  I recommended resumption of metoprolol succinate 50 mg daily.  Also recommended she have an echo Doppler study.  She had recently undergone laboratory by Dr. Franchot Erichsen and TSH was normal at 2.2.  She has history of hereditary hemochromatosis followed by Dr. Marin Olp with a goal to keep her ferritin level less than 35  I saw her in follow-up on Sep 26, 2021.  Since her prior evaluation she had stopped taking the dextroamphetamine for 2 weeks but apparently again resumed treatment since he felt she was not as attentive cognitively.  Apparently she has been taking metoprolol succinate 50 mg  twice a day and resumed the dextroamphetamine.  An echo Doppler study on September 04, 2021 showed an EF of 60 to 65% with grade 1 diastolic dysfunction.  There was mild aortic sclerosis without stenosis.  Ascending aorta measures 36 mm.  She denies any chest pain.  She is unaware of palpitations.  She denies bleeding but was wondering if she really needed to take Eliquis twice a day.  She presents for reevaluation.  During that evaluation, her ECG showed sinus rhythm at 69 with sinus arrhythmia and first-degree AV block.  Since I last saw her, she has felt well.  She admits to being under increased stress.  She often gets out of breath she self reduced her Eliquis to 2.5 mg twice a day and then to just a half a pill.  She continues to see Dr. Marin Olp for primary hemochromatosis and is heterozygous for the C282Y [place].  She currently undergoes phlebotomy to keep ferritin less than 35.  She continues to be on hydroxychloroquine for lupus/RA.  She is on amiloride hydrochlorothiazide with and takes one half  of a 5/50 mg tablet apparently to reduce her bladder symptoms.  She continues to be on thyroid replacement and is on metoprolol succinate 25 mg twice a day.  She presents for follow-up evaluation.   Past Medical History:  Diagnosis Date   Autoimmune hepatitis ([place])    Cancer ([place]) 06/2017   SQUAMOS CELL REMOVED FROM FOREHEAD   Chronic fatigue    Complication of anesthesia    Partial Hysterectomy-Severe pain that radiated to esophogaus, and 12 more episode    Hemochromatosis    Hormone disorder    Hypothyroidism    IBS (irritable bowel syndrome)    Lupus (HCC)    Mixed connective tissue disease ([place])    Osteoporosis 07/2017   T score -2.7 stable from prior DEXA   Palpitations    Rheumatoid arthritis ([place])     Past Surgical History:  Procedure Laterality Date   ABDOMINAL HYSTERECTOMY     Partial; pt states has only had ovaries removed   ABDOMINAL SURGERY     ABDOMINOPLASTY   BLADDER  SUSPENSION     BREAST SURGERY  1980   LUMPECTOMY X 2 FROM RIGHT BREAST   CARDIOVASCULAR STRESS TEST  08/28/2011   Normal   CAROTID DOPPLER  08/28/2011   Normal, no evidence of significant diameter reduction, dissection, or vascular abnormality   COLONOSCOPY W/ POLYPECTOMY  2018   IR IMAGING GUIDED PORT INSERTION  04/27/2020   IR RADIOLOGIST EVAL & MGMT  03/20/2021   LAPAROSCOPIC CHOLECYSTECTOMY     phlebotomies     TONSILLECTOMY     TONSILLECTOMY AND ADENOIDECTOMY     TRANSTHORACIC ECHOCARDIOGRAM  01/29/2011   EF >55%, mild concentric LVH   TUBAL LIGATION      Allergies  Allergen Reactions   Albumin (Human)     Pt stated, "I am not allergic to this and don't know what this means"; 04/27/17   Amoxicillin Nausea And Vomiting   Codeine Nausea Only   Erythromycin Nausea Only and Other (See Comments)   Iodinated Contrast Media Other (See Comments)   Keflex [Cephalexin] Itching   Meperidine     Pt stated, "I don't know what this is"; 04/27/17   Metoclopramide Hcl    Other Other (See Comments)    Beef Chicken Dairy Eggs  Pt stated, "My body can't process protein; it makes me sick"   Percocet [Oxycodone-Acetaminophen]     Made pt sick    Shellfish Allergy Other (See Comments)    Pt stated, "I am not allergic to this anymore"; 04/27/17 Pt stated, "I am not allergic to this anymore"; 04/27/17   Shellfish-Derived Products    Oxycodone Nausea And Vomiting    Pt stated, "I do not know if I am allergic to this"; 04/27/17    Current Outpatient Medications  Medication Sig Dispense Refill   amiloride-hydrochlorothiazide (MODURETIC) 5-50 MG tablet Take 0.5 tablets by mouth daily. As Needed     apixaban (ELIQUIS) 5 MG TABS tablet Take 1 tablet (5 mg total) by mouth 2 (two) times daily. 60 tablet 0   Ascorbic Acid (VITAMIN C) 1000 MG tablet Take 3,000 mg by mouth daily.     Cholecalciferol (VITAMIN D PO) Take 1 tablet by mouth daily.     fish oil-omega-3 fatty acids 1000 MG capsule Take 1 g by  mouth daily.     fluconazole (DIFLUCAN) 200 MG tablet Take 100 mg by mouth daily as needed (yeast).     Hydroxychloroquine Sulfate 100 MG TABS Take 1 tablet  by mouth daily.     ipratropium (ATROVENT) 0.06 % nasal spray Place 2 sprays into both nostrils daily as needed.     IVERMECTIN PO Take 12 mg by mouth once.     MELATONIN PO Take 2 mg by mouth at bedtime.      metoprolol succinate (TOPROL XL) 25 MG 24 hr tablet Take 25 mg twice a day only if needed for palpitations 60 tablet 6   Multiple Vitamin (MULTIVITAMIN PO) Take by mouth.     nystatin (MYCOSTATIN) 500000 units TABS tablet Take 1 tablet by mouth at bedtime.     potassium chloride SA (K-DUR,KLOR-CON) 20 MEQ tablet Take 20 mEq by mouth daily.      PRASTERONE, DHEA, PO Take 28 mg by mouth daily.      Probiotic Product (PROBIOTIC DAILY PO) Take 1 capsule by mouth daily.     progesterone (PROMETRIUM) 100 MG capsule Take 100 mg by mouth daily.      thyroid (ARMOUR) 15 MG tablet Take 15 mg by mouth 3 (three) times a week.     thyroid (ARMOUR) 60 MG tablet Take 60 mg by mouth daily before breakfast.     vitamin B-12 (CYANOCOBALAMIN) 1000 MCG tablet Take 1,000 mcg by mouth daily.     zinc gluconate 50 MG tablet Take 50 mg by mouth daily.     Current Facility-Administered Medications  Medication Dose Route Frequency Provider Last Rate Last Admin   triamcinolone acetonide (KENALOG) 10 MG/ML injection 10 mg  10 mg Other Once Wallene Huh, DPM        Socially History is notable in that she is married for >45 years. She has 3 children and 2 grandchildren. She completed college. She does drink occasional wine. There is no tobacco history. She does walk occasionally.   Family History  Problem Relation Age of Onset   Seizures Mother    Transient ischemic attack Mother    Glaucoma Mother    Cancer Father 59       COLON   Diabetes Father    Heart disease Father    Breast cancer Maternal Aunt    Heart disease Paternal Uncle    Breast  cancer Maternal Aunt    Glaucoma Maternal Grandmother     ROS General: Negative; No fevers, chills, or night sweats; positive for chronic fatigue HEENT: Negative; No changes in vision or hearing, sinus congestion, difficulty swallowing Pulmonary: Negative; No cough, wheezing, shortness of breath, hemoptysis Cardiovascular: Negative; No chest pain, presyncope, syncope, palpitations GI: Nausea for irritable bowel syndrome No nausea, vomiting, diarrhea, or abdominal pain GU: Negative; No dysuria, hematuria, or difficulty voiding Musculoskeletal: Positive for mixed connective tissue disease Rheumatologic: Reported history of lupus and rheumatoid disease. Hematologic/Oncology: Positive for hemachromatosis for which she undergoes phlebotomy if ferritin level is greater than 35 Endocrine: Negative; no heat/cold intolerance; no diabetes Neuro: Negative; no changes in balance, headaches Skin: Negative; No rashes or skin lesions Psychiatric: Negative; No behavioral problems, depression Sleep: Negative; No snoring, daytime sleepiness, hypersomnolence, bruxism, restless legs, hypnogognic hallucinations, no cataplexy Other comprehensive 14 point system review is negative.   PE BP 100/61 (BP Location: Left Arm, Patient Position: Sitting)   Pulse 71   Ht _0  (1.676 m)   Wt 197 lb 12.8 oz (89.7 kg)   SpO2 97%   BMI 31.93 kg/m    Repeat blood pressure by me was 100/60.  Wt Readings from Last 3 Encounters:  04/03/22 197 lb 12.8 oz (89.7 kg)  12/17/21 154 lb 12.8 oz (70.2 kg)  09/26/21 153 lb (69.4 kg)   General: Alert, oriented, no distress.  Skin: normal turgor, no rashes, warm and dry HEENT: Normocephalic, atraumatic. Pupils equal round and reactive to light; sclera anicteric; extraocular muscles intact;  Nose without nasal septal hypertrophy Mouth/Parynx benign; Mallinpatti scale 3 Neck: No JVD, no carotid bruits; normal carotid upstroke Lungs: clear to ausculatation and percussion; no  wheezing or rales Chest wall: without tenderness to palpitation Heart: PMI not displaced, RRR, s1 s2 normal, 1/6 systolic murmur, no diastolic murmur, no rubs, gallops, thrills, or heaves Abdomen: soft, nontender; no hepatosplenomehaly, BS+; abdominal aorta nontender and not dilated by palpation. Back: no CVA tenderness Pulses 2+ Musculoskeletal: full range of motion, normal strength, no joint deformities Extremities: no clubbing cyanosis or edema, Homan's sign negative  Neurologic: grossly nonfocal; Cranial nerves grossly wnl Psychologic: Normal mood and affect  April 03, 2022 ECG (independently read by me): Sinus rhythm at 71,1st degree block,  PR 242 msec  Sep 26, 2021 ECG (independently read by me): Sinus rhyhm at 69 with sinus arrhythmia, 1 st degree AV block  August 28, 2021 ECG (independently read by me): Probable atrial fibrillation with coarse fibrillatory waves.  November 20, 2020 ECG (independently read by me): Sinus rhythm at 73; 1st degree AV block; PR 230 msec;  April 2021 ECG (independently read by me): Normal sinus rhythm at 82 bpm.  First-degree AV block with a PR interval at 212 ms.  No ectopy.  March 2019 ECG (independently read by me): Normal sinus rhythm at 88 bpm.  Borderline first-degree AV block with a PR interval of 204 ms.  Normal intervals.  No ST segment changes.  February 2018 ECG (independently read by me): Normal sinus rhythm with PACs and mild sinus arrhythmia.  QTc interval 442 ms.  November 2016 ECG (independently read by me): ECG reveals sinus rhythm with first-degree AV block with PR interval 216 only seconds. Occasional PACs.  Nonspecific ST changes.  November 2015 ECG (independently read by me): Sinus rhythm with sinus arrhythmia and PACs and transient atrial bigeminal like pattern  Prior November 2014  ECG: Sinus rhythm with PAC. QTc interval 442 ms. PR interval 200 ms  LABS:    Latest Ref Rng & Units 03/19/2022    1:27 PM 12/17/2021    1:18 PM  09/04/2021    8:44 AM  BMP  Glucose 70 - 99 mg/dL 91  94  100   BUN 8 - 23 mg/dL _0 Creatinine 0.44 - 1.00 mg/dL 0.75  0.81  0.91   BUN/Creat Ratio 12 - 28   16   Sodium 135 - 145 mmol/L 139  134  141   Potassium 3.5 - 5.1 mmol/L 3.9  4.3  4.1   Chloride 98 - 111 mmol/L 102  101  103   CO2 22 - 32 mmol/L _1 Calcium 8.9 - 10.3 mg/dL 10.6  9.7  9.7       Latest Ref Rng & Units 03/19/2022    1:27 PM 12/17/2021    1:18 PM 09/04/2021    8:44 AM  Hepatic Function  Total Protein 6.5 - 8.1 g/dL 7.4  6.6  6.5   Albumin 3.5 - 5.0 g/dL 4.8  4.5  4.4   AST 15 - 41 U/L _2 ALT 0 - 44 U/L _3 Alk Phosphatase  38 - 126 U/L 72  66  85   Total Bilirubin 0.3 - 1.2 mg/dL 0.8  0.6  0.5       Latest Ref Rng & Units 03/19/2022    1:27 PM 12/17/2021    1:18 PM 09/04/2021    8:44 AM  CBC  WBC 4.0 - 10.5 K/uL 11.1  6.8  5.5   Hemoglobin 12.0 - 15.0 g/dL 14.7  14.3  14.7   Hematocrit 36.0 - 46.0 % 43.2  41.8  43.6   Platelets 150 - 400 K/uL 225  204  216    Lab Results  Component Value Date   MCV 97.1 03/19/2022   MCV 97.4 12/17/2021   MCV 94 09/04/2021   Lab Results  Component Value Date   TSH 2.200 08/07/2021     No results found for: "HGBA1C"   Lipid Panel     Component Value Date/Time   CHOL 195 09/04/2021 0844   TRIG 118 09/04/2021 0844   HDL 53 09/04/2021 0844   CHOLHDL 3.7 09/04/2021 0844   CHOLHDL 3.6 08/18/2008 0250   VLDL 17 08/18/2008 0250   LDLCALC 121 (H) 09/04/2021 0844     RADIOLOGY: Dg Foot Complete Left  04/17/2013   Multiple view x-rays of left foot indicate no signs of stress fracture  with what appears to be arthritis at the fifth MPJ with narrowing of the  joint surface noted x-ray  Dg Foot Complete Right  04/17/2013   Multiple view x-rays of right foot indicate good alignment with no signs  of stress fracture or arthritis noted  IMPRESSION:  1. Paroxysmal atrial fibrillation (HCC)   2. First degree AV block   3.  Primary hemochromatosis ([place])   4. Alteration in anticoagulation   5. Chronic fatigue   6. Autoimmune disease [facility])      ASSESSMENT AND PLAN: Ms. Latrisa Hellums is a 76 year old female who is on multiple vitamins and additional supplemental medications prescribed by Dr. Gerarda Gunther at [facility].  She has a history of palpitations and has been taking Toprol-XL.  In the past, palpitations were successfully treated with titration of metoprolol succinate to 37.5 mg daily but remotely she had reduced her dose down to 25 mg.  At a subsequent evaluation I recommended that she resume the 37.5 mg due to occasional ectopy on auscultation.   She was seen in March 2023 by Terri Piedra with increased palpitations.  A Zio patch monitor demonstrated predominant sinus rhythm but she had recurrent episodes of atrial fibrillation with 20% burden rate with heart rate ranging from 57 to 173 bpm (average 92 bpm.  The longest  episode lasted 13.1 hours at a rate of 88.  At my subsequent evaluation on August 28, 2021 I felt she was in atrial fibrillation rather than atrial flutter and had coarse fibrillatory waves.  I recommended that she discontinue dextroamphetamine and recommended she resume metoprolol succinate 50 mg daily.  She was started on anticoagulation with Eliquis 5 mg twice a day and I recommended that this is the adequate dose based on her age weight and serum creatinine.  2D echo Doppler study showed EF of 60 to 65% with grade 1 diastolic dysfunction, and there was evidence for mild aortic sclerosis.  Ascending aorta measures 36 mm.  She subsequently converted back to sinus rhythm and was last seen by me on Sep 26, 2021 ECG showed  sinus rhythm with mild sinus arrhythmia and first-degree AV block PR interval at 260 ms.  She apparently did not feel cognitively alert after holding dextroamphetamine for 2 weeks and as result to self resume this therapy.  She feels that she must continue to take this  for her to cognitive fully function well.  Presently, she had self reduced Eliquis dose and I discussed with her that her current regimen is subtherapeutic with reference to effective anticoagulation based on her age and renal function, and reiterated that Eliquis is a twice daily drug.  She continues to take Armour Thyroid prescribed by Dr. Sid Falcon.  She admits to stress mediated shortness of breath.  Her last echo Doppler study done in April 2023 showed EF 60- 65% with grade 1 diastolic dysfunction.  She will be following up with Dr. Marin Olp regarding her hemochromatosis and undergoes periodic phlebotomy to keep ferritin less than 35   Troy Sine, MD, [facility] 04/09/2022 6:31 PM

## 2022-04-09 ENCOUNTER — Encounter: Payer: Self-pay | Admitting: Cardiovascular Disease

## 2022-04-25 ENCOUNTER — Telehealth: Payer: Self-pay | Admitting: *Deleted

## 2022-04-25 NOTE — Telephone Encounter (Signed)
Returned call to pt regarding Ferritin and possible Phlebotomy.  Pt requesting phlebotomy on 12/5 at 3pm or 12/6 10am. Message to scheduling.

## 2022-04-30 ENCOUNTER — Ambulatory Visit: Payer: PPO | Admitting: Podiatry

## 2022-05-01 ENCOUNTER — Encounter: Payer: Self-pay | Admitting: Podiatry

## 2022-05-01 ENCOUNTER — Inpatient Hospital Stay: Payer: PPO | Attending: Hematology & Oncology

## 2022-05-01 ENCOUNTER — Ambulatory Visit (INDEPENDENT_AMBULATORY_CARE_PROVIDER_SITE_OTHER): Payer: PPO

## 2022-05-01 ENCOUNTER — Ambulatory Visit (INDEPENDENT_AMBULATORY_CARE_PROVIDER_SITE_OTHER): Payer: PPO | Admitting: Podiatry

## 2022-05-01 ENCOUNTER — Other Ambulatory Visit: Payer: Self-pay | Admitting: *Deleted

## 2022-05-01 VITALS — BP 116/57 | HR 65 | Resp 16

## 2022-05-01 DIAGNOSIS — M2042 Other hammer toe(s) (acquired), left foot: Secondary | ICD-10-CM

## 2022-05-01 DIAGNOSIS — Z452 Encounter for adjustment and management of vascular access device: Secondary | ICD-10-CM

## 2022-05-01 DIAGNOSIS — D169 Benign neoplasm of bone and articular cartilage, unspecified: Secondary | ICD-10-CM

## 2022-05-01 DIAGNOSIS — M810 Age-related osteoporosis without current pathological fracture: Secondary | ICD-10-CM

## 2022-05-01 MED ORDER — SODIUM CHLORIDE 0.9% FLUSH
10.0000 mL | INTRAVENOUS | Status: DC | PRN
  Administered 2022-05-01: 10 mL

## 2022-05-01 MED ORDER — LIDOCAINE-PRILOCAINE 2.5-2.5 % EX CREA: 1.0000 | TOPICAL_CREAM | CUTANEOUS | 2 refills | Status: DC | PRN

## 2022-05-01 MED ORDER — HEPARIN SOD (PORK) LOCK FLUSH 100 UNIT/ML IV SOLN
500.0000 [IU] | Freq: Once | INTRAVENOUS | Status: AC | PRN
  Administered 2022-05-01: 500 [IU]

## 2022-05-01 NOTE — Progress Notes (Signed)
Lori [name] presents today for phlebotomy per MD orders. Phlebotomy procedure started at 1110 and ended at 1130. 500 grams removed. Patient observed for 30 minutes after procedure without any incident. Patient tolerated procedure well. IV needle removed intact.

## 2022-05-02 ENCOUNTER — Telehealth: Payer: Self-pay | Admitting: Podiatry

## 2022-05-02 NOTE — Progress Notes (Signed)
Subjective:   Patient ID: Lori [name], female   DOB: 76 y.o.   MRN: [id number]   HPI Patient presents stating she has had a lot of pain with this fifth toe left she knows she should have had it fixed a number of years ago but she ended up with other health issues that had to be dealt with.  States he gets very irritated and the toe is in an abnormal position and she would like it corrected like we had spoken about a number of years ago.  Patient does not smoke tries to stay active   Review of Systems  All other systems reviewed and are negative.       Objective:  Physical Exam Vitals and nursing note reviewed.  Constitutional:      Appearance: She is well-developed.  Pulmonary:     Effort: Pulmonary effort is normal.  Musculoskeletal:        General: Normal range of motion.  Skin:    General: Skin is warm.  Neurological:     Mental Status: She is alert.     Neurovascular status intact muscle strength found to be adequate range of motion adequate with patient found to have a abnormal digit fifth left that is not in good position with pressure against the fourth toe with a distal medial keratotic lesion digit 5 left that is painful when pressed and has a history also of bleeding when it gets aggravated.  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  Patient wants surgery understands procedure risk and I allowed her to read consent form going over alternative treatments complications.  Patient wants this fixed as tentatively scheduled for January all questions answered today and patient is scheduled for outpatient surgery  X-rays do indicate deformity  of the fifth digit with rotation against the fourth toe left foot no indications of advanced arthritis

## 2022-05-02 NOTE — Telephone Encounter (Signed)
DOS: 05/13/2022  [employer]  [employer] 5th Lt [phone]) Exostectomy 5th Lt [phone])  Clinicals were faxed to obtain [employer] prior authorization.  Authorization #: [id number] Authorization Valid: 05/13/2022 - 08/11/2022

## 2022-05-05 ENCOUNTER — Telehealth: Payer: Self-pay | Admitting: Cardiovascular Disease

## 2022-05-05 NOTE — Telephone Encounter (Signed)
   Pre-operative Risk Assessment    Patient Name: Lori [name]  DOB: 1945-08-30 MRN: [id number]      Request for Surgical Clearance    Procedure:   Hammer toe repair on left foot  Date of Surgery:  Clearance 05/13/22                                 Surgeon:  dr. Ila Mcgill  Surgeon's Group or Practice Name:  [facility]  Phone number:  [phone] Fax number:  [phone]   Type of Clearance Requested:   - Medical  - Pharmacy:  Hold Apixaban (Eliquis) TBD   Type of Anesthesia:   TBD   Additional requests/questions:      SignedMilbert Coulter   05/05/2022, 3:46 PM  .

## 2022-05-05 NOTE — Telephone Encounter (Signed)
Patient with diagnosis of afib on Eliquis for anticoagulation.    Procedure:  Hammer toe repair on left foot  Date of procedure: 05/13/22   CHA2DS2-VASc Score = 4   This indicates a 4.8% annual risk of stroke. The patient's score is based upon: CHF History: 0 HTN History: 0 Diabetes History: 0 Stroke History: 0 Vascular Disease History: 1 (aortic atherosclerosis) Age Score: 2 Gender Score: 1      CrCl 73 ml/min  Per office protocol, patient can hold Eliquis for 2 days prior to procedure.    **This guidance is not considered finalized until pre-operative APP has relayed final recommendations.**

## 2022-05-05 NOTE — Telephone Encounter (Signed)
Patient on Eliquis for PAF. Will route to pharmacy team for input.    CHA2DS2-VASc Score = 4   This indicates a 4.8% annual risk of stroke. The patient's score is based upon: CHF History: 0 HTN History: 0 Diabetes History: 0 Stroke History: 0 Vascular Disease History: 1 (aortic atherosclerosis) Age Score: 2 Gender Score: 1     Platelet count: 03/19/2022: Platelet Count 225   Creatinine clearance: 73 mL/min adjusted for weight  03/19/2022: Creatinine 0.75; Hemoglobin 14.7     Loel Dubonnet, NP  05/05/22  4:29 PM

## 2022-05-07 NOTE — Telephone Encounter (Signed)
   Patient Name: Jouri Threat  DOB: 14-Jul-1945 MRN: [id number]  Primary Cardiologist: Shelva Majestic, MD  Chart reviewed as part of pre-operative protocol coverage. Given past medical history and time since last visit, based on ACC/AHA guidelines, Darshay Sheilia Reznick is at acceptable risk for the planned procedure without further cardiovascular testing.   Per pharmacy team, may hold Eliquis 2 days prior to planned procedure.   The patient was advised that if she develops new symptoms prior to surgery to contact our office to arrange for a follow-up visit, and she verbalized understanding.  I will route this recommendation to the requesting party via Epic fax function and remove from pre-op pool.  Please call with questions.  Loel Dubonnet, NP 05/07/2022, 10:43 AM

## 2022-05-12 ENCOUNTER — Telehealth: Payer: Self-pay | Admitting: Podiatry

## 2022-05-12 MED ORDER — HYDROCODONE-ACETAMINOPHEN 10-325 MG PO TABS: 1.0000 | ORAL_TABLET | Freq: Three times a day (TID) | ORAL | 0 refills | Status: AC | PRN

## 2022-05-12 NOTE — Telephone Encounter (Signed)
I called the patient back to assist her with her questions she has regarding her surgery tomorrow morning. Patient asked if the scrub brush I use to wash my surgery foot with tonight, do I wash that off, and do I use filtered water. Also, what kind of shoe should I wear to the surgical center tomorrow. I'm asking because it seems like even after I was and I use a towel out of the dryer to dry my foot off and use a clean sock the area is not going to be sterile.   Called and spoke to [place] about patients question then called and left a voicemail for patient letting her know that our office requires her to wash off the night before with the antiseptic scrub brush and even though the area will not be sterile tonight, they will cleanse the surgical area at the surgical center in the morning so that the area will be sterile for surgery. Told patient to call back with any other questions and/or concerns.

## 2022-05-12 NOTE — Addendum Note (Signed)
Addended by: Wallene Huh on: 05/12/2022 05:13 PM   Modules accepted: Orders

## 2022-05-12 NOTE — Telephone Encounter (Signed)
My surgery with Dr. Paulla Dolly has been moved up to 10:45 am tomorrow and I have a couple of minor little questions that I wasn't sure about.

## 2022-05-13 ENCOUNTER — Encounter: Payer: Self-pay | Admitting: Podiatry

## 2022-05-13 DIAGNOSIS — L570 Actinic keratosis: Secondary | ICD-10-CM | POA: Diagnosis not present

## 2022-05-13 DIAGNOSIS — M84872 Other disorders of continuity of bone, left ankle and foot: Secondary | ICD-10-CM | POA: Diagnosis not present

## 2022-05-13 DIAGNOSIS — M25775 Osteophyte, left foot: Secondary | ICD-10-CM | POA: Diagnosis not present

## 2022-05-13 DIAGNOSIS — M2042 Other hammer toe(s) (acquired), left foot: Secondary | ICD-10-CM | POA: Diagnosis not present

## 2022-05-21 ENCOUNTER — Ambulatory Visit (INDEPENDENT_AMBULATORY_CARE_PROVIDER_SITE_OTHER): Payer: PPO | Admitting: Podiatry

## 2022-05-21 ENCOUNTER — Ambulatory Visit (INDEPENDENT_AMBULATORY_CARE_PROVIDER_SITE_OTHER): Payer: PPO

## 2022-05-21 DIAGNOSIS — Z09 Encounter for follow-up examination after completed treatment for conditions other than malignant neoplasm: Secondary | ICD-10-CM

## 2022-05-21 NOTE — Progress Notes (Signed)
Patient seen at the office for POV #1 DOS 05/13/2022 Hammertoe repair 5th LT, Exostectomy 5th LT. Patient denies any fever, nausea, vomiting, chills, chest pain. Pain is minimal at this time. Xrays and incision site was assessed by Dr. Paulla Dolly. Patient is able to slowly transition into a surgical shoe but still use the cam boot for long walks. Foot redressed with a coban wrap and surgical shoe was dispensed today.   All questions were answered by Dr. Paulla Dolly.   Patient was advised to contact office with any concerns or questions.

## 2022-06-03 ENCOUNTER — Telehealth: Payer: Self-pay | Admitting: *Deleted

## 2022-06-03 MED ORDER — DOXYCYCLINE HYCLATE 100 MG PO TABS: 100.0000 mg | ORAL_TABLET | Freq: Two times a day (BID) | ORAL | 0 refills | Status: AC

## 2022-06-03 NOTE — Telephone Encounter (Signed)
Patient is calling because her post surgery toe is possibly infected, noticed pus, swelling and bleeding 2 days ago,is using CLO2(OTC),was offered an appointment but unable to come in today due to transportation issues but has an appointment on tomorrow. Please advise.

## 2022-06-03 NOTE — Telephone Encounter (Signed)
Patient has been updated that medication sent.

## 2022-06-04 ENCOUNTER — Other Ambulatory Visit: Payer: PPO

## 2022-06-04 ENCOUNTER — Ambulatory Visit (INDEPENDENT_AMBULATORY_CARE_PROVIDER_SITE_OTHER): Payer: PPO

## 2022-06-04 ENCOUNTER — Ambulatory Visit (INDEPENDENT_AMBULATORY_CARE_PROVIDER_SITE_OTHER): Payer: PPO | Admitting: Podiatry

## 2022-06-04 VITALS — BP 112/48 | HR 74 | Temp 97.8°F

## 2022-06-04 DIAGNOSIS — Z09 Encounter for follow-up examination after completed treatment for conditions other than malignant neoplasm: Secondary | ICD-10-CM

## 2022-06-04 DIAGNOSIS — R194 Change in bowel habit: Secondary | ICD-10-CM | POA: Diagnosis not present

## 2022-06-04 DIAGNOSIS — R197 Diarrhea, unspecified: Secondary | ICD-10-CM | POA: Diagnosis not present

## 2022-06-04 DIAGNOSIS — Z86010 Personal history of colonic polyps: Secondary | ICD-10-CM | POA: Diagnosis not present

## 2022-06-05 ENCOUNTER — Other Ambulatory Visit (HOSPITAL_COMMUNITY): Payer: Self-pay | Admitting: Gastroenterology

## 2022-06-05 DIAGNOSIS — R1032 Left lower quadrant pain: Secondary | ICD-10-CM

## 2022-06-05 NOTE — Progress Notes (Signed)
Subjective:   Patient ID: Lori [name], female   DOB: 77 y.o.   MRN: [id number]   HPI Patient presents stating she had had some redness in her fifth toe left foot and she did put some medicine on and it seems better and she has been taking 1 doxycycline a day which she takes routinely.  States it feels a lot better just wanted it checked   ROS      Objective:  Physical Exam  Neurovascular status intact negative Bevelyn Buckles' sign noted fifth digit left is in a good alignment with some crusted tissue on the inside of the toe where exostectomy was done but localized with no proximal edema erythema drainage noted currently and minimal discomfort with pressure     Assessment:  History of exostosis repair plus derotational arthroplasty for which appears to be healing well but may have had some stress on it during the healing process     Plan:  Reviewed condition and at this point I have recommended two doxycycline a day precautionary and she says she will go to that stitches removed no breakdown of tissue no drainage should heal uneventfully.  If any increased redness and the pain should occur she is to let us know but it should heal over the next probably 4 weeks completely and patient's given all instructions on coming in depending on how it is responding  X-rays indicate bone looks satisfactory good alignment of the digit no other indication of pathology

## 2022-06-13 ENCOUNTER — Ambulatory Visit (INDEPENDENT_AMBULATORY_CARE_PROVIDER_SITE_OTHER): Payer: PPO

## 2022-06-13 ENCOUNTER — Encounter: Payer: Self-pay | Admitting: Podiatry

## 2022-06-13 ENCOUNTER — Ambulatory Visit (INDEPENDENT_AMBULATORY_CARE_PROVIDER_SITE_OTHER): Payer: PPO | Admitting: Podiatry

## 2022-06-13 DIAGNOSIS — Z09 Encounter for follow-up examination after completed treatment for conditions other than malignant neoplasm: Secondary | ICD-10-CM

## 2022-06-13 DIAGNOSIS — L02612 Cutaneous abscess of left foot: Secondary | ICD-10-CM

## 2022-06-13 DIAGNOSIS — L03032 Cellulitis of left toe: Secondary | ICD-10-CM | POA: Diagnosis not present

## 2022-06-13 NOTE — Progress Notes (Signed)
Subjective:   Patient ID: Lori [name], female   DOB: 77 y.o.   MRN: [id number]   HPI Patient presents stating her toes feeling much better today but it has been bothering her son and admits that she has been putting a lot of different medicines on it herself.  Has been taking 2 doxycycline today   ROS      Objective:  Physical Exam  Neurovascular status intact excellent alignment of the fifth digit left with incision site that is healed with a small area of open tissue on the inside of the fifth toe that is localized with no proximal edema erythema or drainage noted.  Minimal discomfort currently no active drainage was noted     Assessment:  Low-grade localized dehiscence on the inside of the fifth digit left with incision site on the dorsal fifth digit left is healing very well with the patient who has been using varied home products on this that I think are creating irritation of the skin     Plan:  H&P x-ray reviewed and I want her to stop the home products that she is using and we will start using Iodosorb to dry this area out.  She promises me she will do that she does not tolerate the 2 doxycycline a day and with an absence of redness or discomfort organ to allow her to go back to her 1 a day that she has been taking for bladder issues.  Patient will be seen back 10 days with instructions to let us know if any changes were to occur increased swelling or drainage or any other type of pathology that might happen  X-rays indicate the distal phalanx looks healthy the middle phalanx I did resect the majority of it so we could straighten that toe and no other issues are noted

## 2022-06-16 ENCOUNTER — Encounter: Payer: PPO | Admitting: Podiatry

## 2022-06-17 ENCOUNTER — Telehealth: Payer: Self-pay | Admitting: *Deleted

## 2022-06-17 NOTE — Telephone Encounter (Signed)
Patient is calling because she is going to take a shower, will use a dryer afterwards,if no objections, please advise.

## 2022-06-18 NOTE — Telephone Encounter (Signed)
That is fine. Just don't heat the area where surgery was done

## 2022-06-18 NOTE — Telephone Encounter (Signed)
Called patient ,no answer but left voice message of physician's recommendations/instructions.

## 2022-06-19 ENCOUNTER — Inpatient Hospital Stay: Payer: PPO | Attending: Hematology & Oncology

## 2022-06-19 ENCOUNTER — Inpatient Hospital Stay: Payer: PPO

## 2022-06-19 ENCOUNTER — Telehealth: Payer: Self-pay | Admitting: Podiatry

## 2022-06-19 VITALS — BP 111/56 | HR 69 | Temp 97.7°F | Resp 16

## 2022-06-19 DIAGNOSIS — Z95828 Presence of other vascular implants and grafts: Secondary | ICD-10-CM

## 2022-06-19 LAB — CMP (CANCER CENTER ONLY)
ALT: 18 U/L (ref 0–44)
AST: 24 U/L (ref 15–41)
Albumin: 4.1 g/dL (ref 3.5–5.0)
Alkaline Phosphatase: 62 U/L (ref 38–126)
Anion gap: 9 (ref 5–15)
BUN: 14 mg/dL (ref 8–23)
CO2: 25 mmol/L (ref 22–32)
Calcium: 9.2 mg/dL (ref 8.9–10.3)
Chloride: 106 mmol/L (ref 98–111)
Creatinine: 0.68 mg/dL (ref 0.44–1.00)
GFR, Estimated: >60 mL/min (ref >60)
Glucose, Bld: 214 mg/dL — ABNORMAL HIGH (ref 70–99)
Potassium: 3.3 mmol/L — ABNORMAL LOW (ref 3.5–5.1)
Sodium: 140 mmol/L (ref 135–145)
Total Bilirubin: 0.4 mg/dL (ref 0.3–1.2)
Total Protein: 6 g/dL — ABNORMAL LOW (ref 6.5–8.1)

## 2022-06-19 LAB — CBC WITH DIFFERENTIAL (CANCER CENTER ONLY)
Abs Immature Granulocytes: 0.01 10*3/uL (ref 0.00–0.07)
Basophils Absolute: 0 10*3/uL (ref 0.0–0.1)
Basophils Relative: 1 %
Eosinophils Absolute: 0.1 10*3/uL (ref 0.0–0.5)
Eosinophils Relative: 1 %
HCT: 39.1 % (ref 36.0–46.0)
Hemoglobin: 12.9 g/dL (ref 12.0–15.0)
Immature Granulocytes: 0 %
Lymphocytes Relative: 48 %
Lymphs Abs: 2.4 10*3/uL (ref 0.7–4.0)
MCH: 32.6 pg (ref 26.0–34.0)
MCHC: 33 g/dL (ref 30.0–36.0)
MCV: 98.7 fL (ref 80.0–100.0)
Monocytes Absolute: 0.4 10*3/uL (ref 0.1–1.0)
Monocytes Relative: 8 %
Neutro Abs: 2.1 10*3/uL (ref 1.7–7.7)
Neutrophils Relative %: 42 %
Platelet Count: 204 10*3/uL (ref 150–400)
RBC: 3.96 MIL/uL (ref 3.87–5.11)
RDW: 11.9 % (ref 11.5–15.5)
WBC Count: 5 10*3/uL (ref 4.0–10.5)
nRBC: 0 % (ref 0.0–0.2)

## 2022-06-19 LAB — FERRITIN: Ferritin: 29 ng/mL (ref 11–307)

## 2022-06-19 MED ORDER — SODIUM CHLORIDE 0.9% FLUSH
10.0000 mL | INTRAVENOUS | Status: DC | PRN
  Administered 2022-06-19: 10 mL via INTRAVENOUS

## 2022-06-19 MED ORDER — HEPARIN SOD (PORK) LOCK FLUSH 100 UNIT/ML IV SOLN
500.0000 [IU] | Freq: Once | INTRAVENOUS | Status: AC
  Administered 2022-06-19: 500 [IU] via INTRAVENOUS

## 2022-06-19 NOTE — Telephone Encounter (Signed)
Pt called and is out of the cream I believe is ( lodosorb) that Dr Paulla Dolly gave her in the office at her last visit. He only wanted her to use this nothing over the counter. Can she come by the office today to get the cream as today is the only day she has a vehicle. She is scheduled to see Dr Paulla Dolly on 1.29.2024

## 2022-06-20 ENCOUNTER — Other Ambulatory Visit: Payer: Self-pay | Admitting: Podiatry

## 2022-06-20 LAB — IRON AND IRON BINDING CAPACITY (CC-WL,HP ONLY)
Iron: 129 ug/dL (ref 28–170)
Saturation Ratios: 55 % — ABNORMAL HIGH (ref 10.4–31.8)
TIBC: 234 ug/dL — ABNORMAL LOW (ref 250–450)
UIBC: 105 ug/dL — ABNORMAL LOW (ref 148–442)

## 2022-06-20 NOTE — Progress Notes (Signed)
Spoke with patient via after hours answering service. Patient states that ever since the sutures have been taken out of the foot the incision has never healed/closed. Currently experiencing serosanginous drainage. She is currently taking Doxycycline '100mg'$  daily. She is also applying Iodosorb daily with gauze and coban wrap.   Patient states that she has had a good experience with wound healing at the [facility] wound care center. She actually called them to set up an appt and is scheduled for 07/16/2022. She states that she will need a referral from our office. She also has a f/u appt with Dr. Paulla Dolly on Monday, 06/23/2021.   Recommend continuing iodosorb and gauze with compression dressing daily through the weekend. F/u with Dr. Paulla Dolly for further treatment and discussion. Continue Doxycycline '100mg'$  daily. Recommend elevation as well and reducing activity. If she notices any indication of worsening infection recommend going immediately to the ED for treatment.   Edrick Kins, DPM [facility]  Dr. Edrick Kins, DPM    [address], [place] [address]                Office [phone]  Fax [phone]

## 2022-06-23 ENCOUNTER — Other Ambulatory Visit: Payer: Self-pay | Admitting: Podiatry

## 2022-06-23 ENCOUNTER — Telehealth: Payer: Self-pay | Admitting: *Deleted

## 2022-06-23 ENCOUNTER — Ambulatory Visit (INDEPENDENT_AMBULATORY_CARE_PROVIDER_SITE_OTHER): Payer: PPO

## 2022-06-23 ENCOUNTER — Ambulatory Visit (INDEPENDENT_AMBULATORY_CARE_PROVIDER_SITE_OTHER): Payer: PPO | Admitting: Podiatry

## 2022-06-23 DIAGNOSIS — L03032 Cellulitis of left toe: Secondary | ICD-10-CM

## 2022-06-23 DIAGNOSIS — L02612 Cutaneous abscess of left foot: Secondary | ICD-10-CM

## 2022-06-23 NOTE — Telephone Encounter (Signed)
I put the order in for the wound care center. It went to the wrong place. Tell her to tell infectious disease that she was supposed to go to wound center

## 2022-06-23 NOTE — Telephone Encounter (Signed)
Patient is calling for clarification of the referral to infectious  disease, does not remember discussing a referral to infectious disease, thought it was supposed to go to the wound center instead.  Should she be prescribed a stronger antibiotic as well? Please advise.

## 2022-06-23 NOTE — Progress Notes (Signed)
Subjective:   Patient ID: Lori [name], female   DOB: 77 y.o.   MRN: [id number]   HPI Patient presents stating that the spot in between the fourth and fifth toes she feels like is not healing.  States that at times it seems better than at times it seems to crack open and she has had a history on her lower leg of having an area that did not heal after dermatological surgery and I went to the wound care center for healing.  States that she has not had any proximal redness does feel at times that she does not feel good but difficult to make a determination on that   ROS      Objective:  Physical Exam  Neurovascular status was found to be intact there is some crusted tissue on the medial side fifth digit left localized to the distal area there is no proximal erythema edema or drainage currently and the hammertoe incision site has healed uneventfully with good position of the toe.     Assessment:  Frustration that there is 1 small area between the toes has not healed despite using Iodosorb to try to dry it out over the last week or 2 with no proximal's clinical indications of infection with patient having no history of fever.  There was no bone or tendon exposure it appears to be superficial at this time     Plan:  H&P reviewed x-rays and we will get a go ahead and send her for infectious disease consult which was done and requested today.  I am hopeful that this area can heal and we will continue utilizing Iodosorb and oral doxycycline and I am ordering blood work consisting of CBC differential metabolic panel and sed rate and gave strict instructions of any increased redness or other pathology were to occur to let us know.  I put in an urgent request for infectious disease consult and also she does have an appointment on February 22 but I am hoping we will be able to get it prior to that event  X-rays are inconclusive I do not think there is bone infection but difficult to make complete  determination currently with no clinical indication to that point

## 2022-06-24 LAB — COMPLETE METABOLIC PANEL WITH GFR
AG Ratio: 1.9 (calc) (ref 1.0–2.5)
ALT: 39 U/L — ABNORMAL HIGH (ref 6–29)
AST: 43 U/L — ABNORMAL HIGH (ref 10–35)
Albumin: 4.1 g/dL (ref 3.6–5.1)
Alkaline phosphatase (APISO): 68 U/L (ref 37–153)
BUN: 20 mg/dL (ref 7–25)
CO2: 27 mmol/L (ref 20–32)
Calcium: 9.8 mg/dL (ref 8.6–10.4)
Chloride: 104 mmol/L (ref 98–110)
Creat: 0.73 mg/dL (ref 0.60–1.00)
Globulin: 2.2 g/dL (calc) (ref 1.9–3.7)
Glucose, Bld: 93 mg/dL (ref 65–139)
Potassium: 4.2 mmol/L (ref 3.5–5.3)
Sodium: 138 mmol/L (ref 135–146)
Total Bilirubin: 0.5 mg/dL (ref 0.2–1.2)
Total Protein: 6.3 g/dL (ref 6.1–8.1)
eGFR: 86 mL/min/{1.73_m2} (ref >=60)

## 2022-06-24 LAB — SEDIMENTATION RATE: Sed Rate: 6 mm/h (ref 0–30)

## 2022-06-24 LAB — CBC WITH DIFFERENTIAL/PLATELET
Absolute Monocytes: 577 cells/uL (ref 200–950)
Basophils Absolute: 37 cells/uL (ref 0–200)
Basophils Relative: 0.6 %
Eosinophils Absolute: 50 cells/uL (ref 15–500)
Eosinophils Relative: 0.8 %
HCT: 42.4 % (ref 35.0–45.0)
Hemoglobin: 14.5 g/dL (ref 11.7–15.5)
Lymphs Abs: 1953 cells/uL (ref 850–3900)
MCH: 33 pg (ref 27.0–33.0)
MCHC: 34.2 g/dL (ref 32.0–36.0)
MCV: 96.6 fL (ref 80.0–100.0)
MPV: 10.4 fL (ref 7.5–12.5)
Monocytes Relative: 9.3 %
Neutro Abs: 3584 cells/uL (ref 1500–7800)
Neutrophils Relative %: 57.8 %
Platelets: 210 10*3/uL (ref 140–400)
RBC: 4.39 10*6/uL (ref 3.80–5.10)
RDW: 11.5 % (ref 11.0–15.0)
Total Lymphocyte: 31.5 %
WBC: 6.2 10*3/uL (ref 3.8–10.8)

## 2022-06-24 NOTE — Telephone Encounter (Signed)
Spoke with patient and she has researched and has decided that she needs an IV antibiotic so she wants both wound care and infectious disease referrals, will go with ID first.

## 2022-06-25 ENCOUNTER — Encounter: Payer: Self-pay | Admitting: Podiatry

## 2022-06-25 ENCOUNTER — Ambulatory Visit: Payer: PPO | Admitting: Cardiovascular Disease

## 2022-06-25 NOTE — Telephone Encounter (Signed)
I sent her a message. Her blood work was normal and hopefully will just require local wound care

## 2022-06-27 ENCOUNTER — Telehealth: Payer: Self-pay

## 2022-06-27 NOTE — Telephone Encounter (Signed)
Patient wanted a return call to discuss last AVS. She stated her last OV on 04/03/22 that her weight and height were not recorded correctly. She stated she is very upset over it. Her wt was documented as 197 and her ht a 5 ft, 6 inches. Her wt should have been recorded as 150 pounds and ht 5 ft, 61/2 inches. She wants it corrected.

## 2022-06-27 NOTE — Telephone Encounter (Signed)
FYI: Received message to call patient regarding medications. Patient stated she had surgery in December on her left little toe. Since then, it has not healed (infection and bleeding). Last week, she took herself off eliquis due to bright red bleeding. She still gets palpitations every now and then. Her podiatrist Ila Mcgill wants to sent her to wound care. Any orders?

## 2022-07-02 ENCOUNTER — Other Ambulatory Visit: Payer: Self-pay

## 2022-07-02 ENCOUNTER — Ambulatory Visit (INDEPENDENT_AMBULATORY_CARE_PROVIDER_SITE_OTHER): Payer: PPO | Admitting: Infectious Diseases

## 2022-07-02 ENCOUNTER — Encounter: Payer: Self-pay | Admitting: Infectious Diseases

## 2022-07-02 VITALS — BP 104/61 | HR 74 | Temp 97.8°F | Wt 160.4 lb

## 2022-07-02 DIAGNOSIS — L03119 Cellulitis of unspecified part of limb: Secondary | ICD-10-CM | POA: Diagnosis not present

## 2022-07-02 DIAGNOSIS — Z79899 Other long term (current) drug therapy: Secondary | ICD-10-CM | POA: Diagnosis not present

## 2022-07-02 DIAGNOSIS — L02619 Cutaneous abscess of unspecified foot: Secondary | ICD-10-CM | POA: Insufficient documentation

## 2022-07-02 DIAGNOSIS — N390 Urinary tract infection, site not specified: Secondary | ICD-10-CM | POA: Diagnosis not present

## 2022-07-02 NOTE — Telephone Encounter (Signed)
Spoke with patient and she is frustrated but appreciated the ability to have it updated in her chart. Informed her that it was updated in visit and should be reflected this point forward, and should see it in her MyChart.

## 2022-07-02 NOTE — Progress Notes (Unsigned)
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   PIC line (peripherally inserted central catheter) flush 03/24/2016   Cough 02/26/2016   Chronic vasomotor rhinitis 02/26/2016   Perennial allergic rhinitis 02/26/2016   Osteoporosis 09/05/2015   Primary hemochromatosis ([place]) 08/27/2015   Urinary tract infection, site not specified 04/24/2015   HSV-2 infection 09/01/2014   Elevated ferritin 08/16/2014   Family history of colon cancer 05/10/2014   Atrial bigeminy 04/13/2014   Atypical chest pain 03/23/2014   Dysphagia 03/23/2014   Esophageal reflux 03/23/2014   Heartburn 03/23/2014   Incomplete emptying of bladder 03/23/2014   Increased frequency of urination 03/23/2014   Mixed connective tissue disease ([place]) 03/23/2014   Nonspecific elevation of level of transaminase or lactic acid dehydrogenase (LDH) 03/23/2014   Urinary incontinence 03/23/2014   Urinary straining 03/23/2014   Urinary hesitancy 03/23/2014   Vaginal candidiasis 03/23/2014   Palpitation 04/23/2013   Chronic fatigue 04/23/2013   History of bladder surgery 04/23/2013   PNEUMONIA, ATYPICAL 04/27/2009   Diffuse connective tissue disease ([place]) 04/27/2009   LUNG NODULE 08/24/2007   SHORTNESS OF BREATH (SOB) 08/24/2007    Patient's Medications  New Prescriptions   No medications on file  Previous Medications   AMILORIDE-HYDROCHLOROTHIAZIDE (MODURETIC) 5-50 MG TABLET    Take 0.5 tablets by mouth daily. As Needed   APIXABAN (ELIQUIS) 5 MG TABS TABLET    Take 1 tablet (5 mg total) by mouth 2 (two) times daily.   ASCORBIC ACID (VITAMIN C) 1000 MG TABLET    Take 3,000 mg by mouth daily.   CHOLECALCIFEROL (VITAMIN D PO)    Take 1 tablet by mouth daily.   FISH OIL-OMEGA-3 FATTY ACIDS 1000 MG  CAPSULE    Take 1 g by mouth daily.   FLUCONAZOLE (DIFLUCAN) 200 MG TABLET    Take 100 mg by mouth daily as needed (yeast).   HYDROXYCHLOROQUINE SULFATE 100 MG TABS    Take 1 tablet by mouth daily.   IPRATROPIUM (ATROVENT) 0.06 % NASAL SPRAY    Place 2 sprays into both nostrils daily as needed.   IVERMECTIN PO    Take 12 mg by mouth once.   LIDOCAINE-PRILOCAINE (EMLA) CREAM    Apply 1 Application topically as needed. Apply before 1 to 2 hours prior to port access.   MELATONIN PO    Take 2 mg by mouth at bedtime.    METOPROLOL SUCCINATE (TOPROL XL) 25 MG 24 HR TABLET    Take 25 mg twice a day only if needed for palpitations   MULTIPLE VITAMIN (MULTIVITAMIN PO)    Take by mouth.   NYSTATIN (MYCOSTATIN) 500000 UNITS TABS TABLET    Take 1 tablet by mouth at bedtime.   POTASSIUM CHLORIDE SA (K-DUR,KLOR-CON) 20 MEQ TABLET    Take 20 mEq by mouth daily.    PRASTERONE, DHEA, PO    Take 28 mg by mouth daily.    PROBIOTIC PRODUCT (PROBIOTIC DAILY PO)    Take 1 capsule by mouth daily.   PROGESTERONE (PROMETRIUM) 100 MG CAPSULE    Take 100 mg by mouth daily.    THYROID (ARMOUR) 15 MG TABLET    Take 15 mg by mouth 3 (three) times a week.   THYROID (ARMOUR)  60 MG TABLET    Take 60 mg by mouth daily before breakfast.   VITAMIN B-12 (CYANOCOBALAMIN) 1000 MCG TABLET    Take 1,000 mcg by mouth daily.   ZINC GLUCONATE 50 MG TABLET    Take 50 mg by mouth daily.  Modified Medications   No medications on file  Discontinued Medications   No medications on file    Subjective: 77 Y O female with PMH as below including  recent 05/13/2022 Hammertoe repair 5th LT, Exostectomy 5th LT who is referred from Podiatry Dr Ila Mcgill for concerns of left 5th toe infcetion  Doxycycline 1/9-1/16  On chronic doxycycline prescribed by urology as well as ID at [place],  Review of Systems: ROS  Past Medical History:  Diagnosis Date   Autoimmune hepatitis ([place])    Cancer ([place]) 06/2017   SQUAMOS CELL REMOVED FROM  FOREHEAD   Chronic fatigue    Complication of anesthesia    Partial Hysterectomy-Severe pain that radiated to esophogaus, and 12 more episode    Hemochromatosis    Hormone disorder    Hypothyroidism    IBS (irritable bowel syndrome)    Lupus ([place])    Mixed connective tissue disease ([place])    Osteoporosis 07/2017   T score -2.7 stable from prior DEXA   Palpitations    Rheumatoid arthritis ([place])    Past Surgical History:  Procedure Laterality Date   ABDOMINAL HYSTERECTOMY     Partial; pt states has only had ovaries removed   ABDOMINAL SURGERY     ABDOMINOPLASTY   BLADDER SUSPENSION     BREAST SURGERY  1980   LUMPECTOMY X 2 FROM RIGHT BREAST   CARDIOVASCULAR STRESS TEST  08/28/2011   Normal   CAROTID DOPPLER  08/28/2011   Normal, no evidence of significant diameter reduction, dissection, or vascular abnormality   COLONOSCOPY W/ POLYPECTOMY  2018   IR IMAGING GUIDED PORT INSERTION  04/27/2020   IR RADIOLOGIST EVAL & MGMT  03/20/2021   LAPAROSCOPIC CHOLECYSTECTOMY     phlebotomies     TONSILLECTOMY     TONSILLECTOMY AND ADENOIDECTOMY     TRANSTHORACIC ECHOCARDIOGRAM  01/29/2011   EF >55%, mild concentric LVH   TUBAL LIGATION      Social History   Tobacco Use   Smoking status: Never   Smokeless tobacco: Never  Vaping Use   Vaping Use: Never used  Substance Use Topics   Alcohol use: Not Currently    Comment: wine occ   Drug use: No    Family History  Problem Relation Age of Onset   Seizures Mother    Transient ischemic attack Mother    Glaucoma Mother    Cancer Father 67       COLON   Diabetes Father    Heart disease Father    Breast cancer Maternal Aunt    Heart disease Paternal Uncle    Breast cancer Maternal Aunt    Glaucoma Maternal Grandmother     Allergies  Allergen Reactions   Albumin (Human)     Pt stated, "I am not allergic to this and don't know what this means"; 04/27/17   Amoxicillin Nausea And Vomiting   Codeine Nausea Only   Erythromycin Nausea  Only and Other (See Comments)   Iodinated Contrast Media Other (See Comments)   Keflex [Cephalexin] Itching   Meperidine     Pt stated, "I don't know what this is"; 04/27/17   Metoclopramide Hcl    Other Other (See Comments)  Beef Chicken Dairy Eggs  Pt stated, "My body can't process protein; it makes me sick"   Percocet [Oxycodone-Acetaminophen]     Made pt sick    Shellfish Allergy Other (See Comments)    Pt stated, "I am not allergic to this anymore"; 04/27/17 Pt stated, "I am not allergic to this anymore"; 04/27/17   Shellfish-Derived Products    Oxycodone Nausea And Vomiting    Pt stated, "I do not know if I am allergic to this"; 04/27/17    Health Maintenance  Topic Date Due   COVID-19 Vaccine (1) Never done   DTaP/Tdap/Td (1 - Tdap) Never done   Zoster Vaccines- Shingrix (1 of 2) Never done   Pneumonia Vaccine 45+ Years old (2 - PCV) 07/06/2011   INFLUENZA VACCINE  Never done   Medicare Annual Wellness (AWV)  03/25/2022   COLONOSCOPY (Pts 45-1yr Insurance coverage will need to be confirmed)  10/10/2030   DEXA SCAN  Completed   Hepatitis C Screening  Completed   HPV VACCINES  Aged Out    Objective:  There were no vitals filed for this visit. There is no height or weight on file to calculate BMI.  Physical Exam Constitutional:      Appearance: Normal appearance.  HENT:     Head: Normocephalic and atraumatic.      Mouth: Mucous membranes are moist.  Eyes:    Conjunctiva/sclera: Conjunctivae normal.     Pupils: Pupils are equal, round, and reactive to light.   Cardiovascular:     Rate and Rhythm: Normal rate and regular rhythm.     Heart sounds: No murmur heard. No friction rub. No gallop.   Pulmonary:     Effort: Pulmonary effort is normal.     Breath sounds: Normal breath sounds.   Abdominal:     General: Non distended     Palpations: soft.   Musculoskeletal:        General: Normal range of motion.   Skin:    General: Skin is warm and dry.      Comments:  Neurological:     General: grossly non focal     Mental Status: awake, alert and oriented to person, place, and time.   Psychiatric:        Mood and Affect: Mood normal.   Lab Results Lab Results  Component Value Date   WBC 6.2 06/23/2022   HGB 14.5 06/23/2022   HCT 42.4 06/23/2022   MCV 96.6 06/23/2022   PLT 210 06/23/2022    Lab Results  Component Value Date   CREATININE 0.73 06/23/2022   BUN 20 06/23/2022   NA 138 06/23/2022   K 4.2 06/23/2022   CL 104 06/23/2022   CO2 27 06/23/2022    Lab Results  Component Value Date   ALT 39 (H) 06/23/2022   AST 43 (H) 06/23/2022   ALKPHOS 62 06/19/2022   BILITOT 0.5 06/23/2022    Lab Results  Component Value Date   CHOL 195 09/04/2021   HDL 53 09/04/2021   LDLCALC 121 (H) 09/04/2021   TRIG 118 09/04/2021   CHOLHDL 3.7 09/04/2021   No results found for: "LABRPR", "RPRTITER" No results found for: "HIV1RNAQUANT", "HIV1RNAVL", "CD4TABS"   Problem List Items Addressed This Visit   None   I have personally spent more than 70 minutes involved in face-to-face and non-face-to-face activities for this patient on the day of the visit. Professional time spent includes the following activities: Preparing to see the patient (review of  tests), Obtaining and/or reviewing separately obtained history (admission/discharge record), Performing a medically appropriate examination and/or evaluation , Ordering medications/tests/procedures, referring and communicating with other health care professionals, Documenting clinical information in the EMR, Independently interpreting results (not separately reported), Communicating results to the patient/family/caregiver, Counseling and educating the patient/family/caregiver and Care coordination (not separately reported).   Wilber Oliphant, [place] [facility] 07/02/2022, 12:25 PM

## 2022-07-03 DIAGNOSIS — Z79899 Other long term (current) drug therapy: Secondary | ICD-10-CM | POA: Insufficient documentation

## 2022-07-03 DIAGNOSIS — N390 Urinary tract infection, site not specified: Secondary | ICD-10-CM | POA: Insufficient documentation

## 2022-07-07 ENCOUNTER — Telehealth: Payer: Self-pay | Admitting: Podiatry

## 2022-07-07 NOTE — Telephone Encounter (Signed)
I sent it to the wound care center last week. They should be contacting her. She can come and get a tube of iodosorb and I'm happy to see her

## 2022-07-07 NOTE — Telephone Encounter (Signed)
Patient called and stated that she was suppose to received a referral for the wound clinic. She would also like another tube of the Iodosorb.  Please advise

## 2022-07-08 ENCOUNTER — Encounter (HOSPITAL_BASED_OUTPATIENT_CLINIC_OR_DEPARTMENT_OTHER): Payer: PPO | Attending: Internal Medicine | Admitting: General Surgery

## 2022-07-08 DIAGNOSIS — M351 Other overlap syndromes: Secondary | ICD-10-CM | POA: Diagnosis not present

## 2022-07-08 DIAGNOSIS — I10 Essential (primary) hypertension: Secondary | ICD-10-CM | POA: Insufficient documentation

## 2022-07-08 DIAGNOSIS — L97522 Non-pressure chronic ulcer of other part of left foot with fat layer exposed: Secondary | ICD-10-CM | POA: Diagnosis not present

## 2022-07-08 DIAGNOSIS — M069 Rheumatoid arthritis, unspecified: Secondary | ICD-10-CM | POA: Insufficient documentation

## 2022-07-08 DIAGNOSIS — T8131XA Disruption of external operation (surgical) wound, not elsewhere classified, initial encounter: Secondary | ICD-10-CM | POA: Diagnosis not present

## 2022-07-08 DIAGNOSIS — M329 Systemic lupus erythematosus, unspecified: Secondary | ICD-10-CM | POA: Diagnosis not present

## 2022-07-08 DIAGNOSIS — J45909 Unspecified asthma, uncomplicated: Secondary | ICD-10-CM | POA: Diagnosis not present

## 2022-07-08 NOTE — Progress Notes (Signed)
CLARIBEL, BOTA ([id number]) [id number] Page 1 of 10 Visit Report for 07/08/2022 Allergy List Details Patient Name: Date of Service: Lori Mt NNE B. 07/08/2022 8:00 A M Medical Record Number: [id number] Patient Account Number: [id number] Date of Birth/Sex: Treating RN: 04-Oct-1945 (77 y.o. Lori [name] Primary Care Lori [name]: Lori [name] Other Clinician: Referring Lori [name]: Treating Lori [name]/Extender: Lori [name]: 0 Allergies Active Allergies albumin human amoxicillin codeine Keflex Percocet shellfish derived erythromycin base Reaction: nausea Severity: Severe Iodinated Contrast Media Severity: Moderate meperidine metoclopramide Percocet oxycodone Reaction: nausea/ vomiting Allergy Notes Electronic Signature(s) Unsigned Previous Signature: 07/08/2022 7:45:46 AM Version By: Lori East RN Entered By: Lori [name] on 07/08/2022 08:16:10 -------------------------------------------------------------------------------- Arrival Information Details Patient Name: Date of Service: Lori [name], [name] 07/08/2022 8:00 A M Medical Record Number: [id number] Patient Account Number: [id number] Date of Birth/Sex: Treating RN: 12-27-1945 (77 y.o. Lori [name], Lori [name] Primary Care Lori [name]: Lori [name] Other Clinician: Referring Lori [name]: Treating Lori [name]/Extender: Lori [name]: 0 Lori [name] ([id number]) [id number] Page 2 of 10 Visit Information Patient Arrived: Ambulatory Arrival Time: 08:08 Accompanied By: self Transfer Assistance: None History Since Last Visit Added or deleted any medications: No Any new allergies or adverse reactions: No Had a fall or experienced change in activities of daily living that may affect risk of falls: No Signs or symptoms of abuse/neglect since last visito No Hospitalized since last visit:  No Implantable device outside of the clinic excluding cellular tissue based products placed in the center since last visit: No Has Dressing in Place as Prescribed: Yes Pain Present Now: Yes Electronic Signature(s) Unsigned Entered ByBlanche [name] on 07/08/2022 08:08:39 -------------------------------------------------------------------------------- Clinic Level of Care Assessment Details Patient Name: Date of Service: Lori Mt NNE B. 07/08/2022 8:00 A M Medical Record Number: [id number] Patient Account Number: [id number] Date of Birth/Sex: Treating RN: [birth year] (77 y.o. Lori [name] Primary Care Lori [name]: Lori [name] Other Clinician: Referring Lori [name]: Treating Lori [name]/Extender: Lori [name]: 0 Clinic Level of Care Assessment Items TOOL 1 Quantity Score X- 1 0 Use when EandM and Procedure is performed on INITIAL visit ASSESSMENTS - Nursing Assessment / Reassessment X- 1 20 General Physical Exam (combine w/ comprehensive assessment (listed just below) when performed on new pt. evals) X- 1 25 Comprehensive Assessment (HX, ROS, Risk Assessments, Wounds Hx, etc.) ASSESSMENTS - Wound and Skin Assessment / Reassessment X- 1 10 Dermatologic / Skin Assessment (not related to wound area) ASSESSMENTS - Ostomy and/or Continence Assessment and Care []$  - 0 Incontinence Assessment and Management []$  - 0 Ostomy Care Assessment and Management (repouching, etc.) PROCESS - Coordination of Care X - Simple Patient / Family Education for ongoing care 1 15 []$  - 0 Complex (extensive) Patient / Family Education for ongoing care X- 1 10 Staff obtains Consents, Records, T Results / Process Orders est X- 1 10 Staff telephones HHA, Nursing Homes / Clarify orders / etc []$  - 0 Routine Transfer to another Facility (non-emergent condition) []$  - 0 Routine Hospital Admission (non-emergent condition) X- 1 15 New Admissions / Biomedical engineer /  Ordering NPWT Apligraf, etc. , []$  - 0 Emergency Hospital Admission (emergent condition) PROCESS - Special Needs []$  - 0 Pediatric / Minor Patient Management []$  - 0 Isolation Patient Management Lori [name], Lori [name] ([id number]) [phone].pdf Page 3 of 10 []$  - 0 Hearing / Language / Visual special needs []$  - 0 Assessment of Community assistance (transportation, D/C planning, etc.) []$  - 0 Additional  assistance / Altered mentation []$  - 0 Support Surface(s) Assessment (bed, cushion, seat, etc.) INTERVENTIONS - Miscellaneous []$  - 0 External ear exam []$  - 0 Patient Transfer (multiple staff / Civil Service fast streamer / Similar devices) []$  - 0 Simple Staple / Suture removal (25 or less) []$  - 0 Complex Staple / Suture removal (26 or more) []$  - 0 Hypo/Hyperglycemic Management (do not check if billed separately) X- 1 15 Ankle / Brachial Index (ABI) - do not check if billed separately Has the patient been seen at the hospital within the last three years: Yes Total Score: 120 Level Of Care: New/Established - Level 4 Electronic Signature(s) Unsigned Entered ByBlanche [name] on 07/08/2022 09:01:41 -------------------------------------------------------------------------------- Encounter Discharge Information Details Patient Name: Date of Service: Lori Mt NNE B. 07/08/2022 8:00 A M Medical Record Number: [id number] Patient Account Number: [id number] Date of Birth/Sex: Treating RN: [birth year] (77 y.o. Lori [name] Primary Care Lori [name]: Lori [name] Other Clinician: Referring Lori [name]: Treating Lori [name]/Extender: Lori [name]: 0 Encounter Discharge Information Items Post Procedure Vitals Discharge Condition: Stable Temperature (F): 98.7 Ambulatory Status: Ambulatory Pulse (bpm): 87 Discharge Destination: Home Respiratory Rate (breaths/min): 16 Transportation: Private Auto Blood Pressure (mmHg): 109/62 Accompanied By:  self Schedule Follow-up Appointment: Yes Clinical Summary of Care: Electronic Signature(s) Signed: 07/08/2022 9:20:37 AM By: Lori East RN Entered By: Lori [name] on 07/08/2022 09:20:37 -------------------------------------------------------------------------------- Lower Extremity Assessment Details Patient Name: Date of Service: Lori Mt NNE B. 07/08/2022 8:00 A M Medical Record Number: [id number] Patient Account Number: [id number] Date of Birth/Sex: Treating RN: [birth year] (77 y.o. Lori [name], Lori [name] Primary Care Reshaun Briseno: Lori [name] Other Clinician: Referring Nasim Habeeb: Treating Rylend Pietrzak/Extender: Lori [name]: 0 Lori [name], Lori [name] ([id number]) [id number] Page 4 of 10 Edema Assessment Assessed: [Left: No] [Right: No] [Left: Edema] [Right: :] Calf Left: Right: Point of Measurement: From Medial Instep 33 cm Ankle Left: Right: Point of Measurement: From Medial Instep 19.5 cm Vascular Assessment Pulses: Dorsalis Pedis Palpable: [Left:Yes] Blood Pressure: Brachial: [Left:109] Ankle: [Left:Dorsalis Pedis: 120 1.10] Electronic Signature(s) Unsigned Entered ByBlanche [name] on 07/08/2022 08:38:41 -------------------------------------------------------------------------------- Multi Wound Chart Details Patient Name: Date of Service: Lori Mt NNE B. 07/08/2022 8:00 A M Medical Record Number: [id number] Patient Account Number: [id number] Date of Birth/Sex: Treating RN: 02-Jan-1946 (77 y.o. F) Primary Care Chevez Sambrano: Lori [name] Other Clinician: Referring Mekesha Solomon: Treating Rodgers Likes/Extender: Lori [name]: 0 Vital Signs Height(in): 66 Pulse(bpm): 87 Weight(lbs): 150 Blood Pressure(mmHg): 109/62 Body Mass Index(BMI): 24.2 Temperature(F): 98.7 Respiratory Rate(breaths/min): 16 [2:Photos:] [N/A:N/A] Left, Medial T Fifth oe N/A N/A Wound  Location: Surgical Injury N/A N/A Wounding Event: Dehisced Wound N/A N/A Primary Etiology: Glaucoma, Asthma, Lupus N/A N/A Comorbid History: Erythematosus, Rheumatoid Arthritis 05/28/2022 N/A N/A Date Acquired: 0 N/A N/A Weeks of [name]: Open N/A N/A Wound Status: No N/A N/A Wound Recurrence: Lori [name], Lori [name] ([id number]) [phone].pdf Page 5 of 10 0.7x0.3x0.1 N/A N/A Measurements L x W x D (cm) 0.165 N/A N/A A (cm) : rea 0.016 N/A N/A Volume (cm) : Full Thickness Without Exposed N/A N/A Classification: Support Structures Medium N/A N/A Exudate A mount: Serous N/A N/A Exudate Type: amber N/A N/A Exudate Color: Medium (34-66%) N/A N/A Granulation A mount: Pink N/A N/A Granulation Quality: Medium (34-66%) N/A N/A Necrotic A mount: Eschar, Adherent Slough N/A N/A Necrotic Tissue: Fat Layer (Subcutaneous Tissue): Yes N/A N/A Exposed Structures: Fascia: No Tendon: No Muscle: No Joint: No Bone: No Small (1-33%) N/A N/A Epithelialization: Debridement -  Selective/Open Wound N/A N/A Debridement: Pre-procedure Verification/Time Out 08:46 N/A N/A Taken: Lidocaine 4% Topical Solution N/A N/A Pain Control: Necrotic/Eschar, Slough N/A N/A Tissue Debrided: Non-Viable Tissue N/A N/A Level: 0.21 N/A N/A Debridement A (sq cm): rea Curette N/A N/A Instrument: Minimum N/A N/A Bleeding: Pressure N/A N/A Hemostasis A chieved: Procedure was tolerated well N/A N/A Debridement [name] Response: 0.7x0.3x0.1 N/A N/A Post Debridement Measurements L x W x D (cm) 0.016 N/A N/A Post Debridement Volume: (cm) No Abnormalities Noted N/A N/A Periwound Skin Texture: Dry/Scaly: Yes N/A N/A Periwound Skin Moisture: Maceration: No Rubor: Yes N/A N/A Periwound Skin Color: No Abnormality N/A N/A Temperature: Yes N/A N/A Tenderness on Palpation: Debridement N/A N/A Procedures Performed: [name] Notes Wound #2 (Toe Fifth) Wound  Laterality: Left, Medial Cleanser Soap and Water Discharge Instruction: May shower and wash wound with dial antibacterial soap and water prior to dressing change. Wound Cleanser Discharge Instruction: Cleanse the wound with wound cleanser prior to applying a clean dressing using gauze sponges, not tissue or cotton balls. Peri-Wound Care Skin Prep Discharge Instruction: Use skin prep as directed Topical Primary Dressing Iodosorb Gel 10 (gm) Tube Discharge Instruction: Apply to wound bed as instructed Secondary Dressing Zetuvit Plus Silicone Border Dressing 4x4 (in/in) Discharge Instruction: Apply silicone border over primary dressing as directed. Secured With Transpore Surgical Tape, 2x10 (in/yd) Discharge Instruction: Secure dressing with tape as directed. Compression Wrap Compression Stockings Add-Ons Electronic Signature(s) Signed: 07/08/2022 9:27:45 AM By: Fredirick Maudlin MD FACS Entered By: Fredirick Maudlin on 07/08/2022 09:27:44 Caryn Bee ([id number].pdf Page 6 of 10 -------------------------------------------------------------------------------- Multi-Disciplinary Care Plan Details Patient Name: Date of Service: Lori Mt NNE B. 07/08/2022 8:00 A M Medical Record Number: [id number] Patient Account Number: [id number] Date of Birth/Sex: Treating RN: 1945/12/15 (77 y.o. Lori [name] Primary Care Braxdon Gappa: Marton [name] Other Clinician: Referring Perpetua Elling: Treating Jeraldean Wechter/Extender: Lori [name]: 0 Active Inactive Nutrition Nursing Diagnoses: Potential for alteratiion in Nutrition/Potential for imbalanced nutrition Goals: Patient/caregiver agrees to and verbalizes understanding of need to use nutritional supplements and/or vitamins as prescribed Date Initiated: 07/08/2022 Target Resolution Date: 09/01/2022 Goal Status: Active Interventions: Assess patient nutrition upon admission and  as needed per policy Provide education on nutrition [name] Activities: Dietary management education, guidance and counseling : 07/08/2022 Education provided on Nutrition : 07/08/2022 Patient referred to dietician for evaluation and possible medical nutrition therapy : 07/08/2022 Notes: Orientation to the Wound Care Program Nursing Diagnoses: Knowledge deficit related to the wound healing center program Goals: Patient/caregiver will verbalize understanding of the [place] Program Date Initiated: 07/08/2022 Target Resolution Date: 07/30/2022 Goal Status: Active Interventions: Provide education on orientation to the wound center Notes: Wound/Skin Impairment Nursing Diagnoses: Knowledge deficit related to ulceration/compromised skin integrity Goals: Ulcer/skin breakdown will have a volume reduction of 30% by week 4 Date Initiated: 07/08/2022 Target Resolution Date: 08/06/2022 Goal Status: Active Interventions: Assess patient/caregiver ability to obtain necessary supplies Assess ulceration(s) every visit Provide education on ulcer and skin care [name] Activities: Skin care regimen initiated : 07/08/2022 Notes: Lori [name], Lori [name] ([id number]) [phone].pdf Page 7 of 10 Electronic Signature(s) Unsigned Entered By: Lori [name] on 07/08/2022 09:08:30 -------------------------------------------------------------------------------- Pain Assessment Details Patient Name: Date of Service: Lori Mt NNE B. 07/08/2022 8:00 A M Medical Record Number: [id number] Patient Account Number: [id number] Date of Birth/Sex: Treating RN: 25-Nov-1945 (77 y.o. Lori [name], Lori [name] Primary Care Joshuan Bolander: Lori [name] Other Clinician: Referring Taber Sweetser: Treating Stepanie Graver/Extender: Lori [name]: 0 Active Problems  Location of Pain Severity and Description of Pain Patient Has Paino Yes Site Locations Pain Location: Pain in  Ulcers Rate the pain. Current Pain Level: 5 Pain Management and Medication Current Pain Management: Notes pain worst at night Electronic Signature(s) Unsigned Entered By: Lori [name] on 07/08/2022 08:34:34 -------------------------------------------------------------------------------- Patient/Caregiver Education Details Patient Name: Date of Service: Lori Mt NNE B. 2/13/2024andnbsp8:00 A M Medical Record Number: [id number] Patient Account Number: [id number] Date of Birth/Gender: Treating RN: Aug 06, 1945 (77 y.o. Lori [name] Primary Care Physician: Lori [name] Other Clinician: Referring Physician: Treating Physician/Extender: Lori [name]: 0 Lori [name], Lori [name] ([id number]) [id number] Page 8 of 10 Education Assessment Education Provided To: Patient Education Topics Provided Nutrition: Handouts: Nutrition Methods: Explain/Verbal Responses: Reinforcements needed, State content correctly Offloading: Handouts: How Offloading Helps Foot Wounds Heal Methods: Explain/Verbal Responses: Reinforcements needed, State content correctly Welcome T [facility]New Patient Packet: o Handouts: The Wound Healing Pledge form, Welcome T [facility] Methods: Explain/Verbal Responses: Reinforcements needed, State content correctly Wound Debridement: Handouts: Wound Debridement Methods: Explain/Verbal Responses: Reinforcements needed, State content correctly Wound/Skin Impairment: Handouts: Caring for Your Ulcer Methods: Explain/Verbal Responses: Reinforcements needed, State content correctly Electronic Signature(s) Unsigned Entered By: Lori [name] on 07/08/2022 09:01:09 -------------------------------------------------------------------------------- Wound Assessment Details Patient Name: Date of Service: Lori Mt NNE B. 07/08/2022 8:00 A M Medical Record Number: [id number] Patient  Account Number: [id number] Date of Birth/Sex: Treating RN: 1946/01/03 (77 y.o. Lori [name] Primary Care Juanluis Guastella: Lori [name] Other Clinician: Referring Breann Losano: Treating Lamarcus Spira/Extender: Lori [name]: 0 Wound Status Wound Number: 2 Primary Dehisced Wound Etiology: Wound Location: Left, Medial T Fifth oe Wound Status: Open Wounding Event: Surgical Injury Comorbid Glaucoma, Asthma, Lupus Erythematosus, Rheumatoid Date Acquired: 05/28/2022 History: Arthritis Weeks Of [name]: 0 Clustered Wound: No Photos Lori [name], Lori [name] ([id number]) [phone].pdf Page 9 of 10 Wound Measurements Length: (cm) 0.7 Width: (cm) 0.3 Depth: (cm) 0.1 Area: (cm) 0.165 Volume: (cm) 0.016 % Reduction in Area: % Reduction in Volume: Epithelialization: Small (1-33%) Tunneling: No Undermining: No Wound Description Classification: Full Thickness Without Exposed Support Structures Exudate Amount: Medium Exudate Type: Serous Exudate Color: amber Foul Odor After Cleansing: No Slough/Fibrino Yes Wound Bed Granulation Amount: Medium (34-66%) Exposed Structure Granulation Quality: Pink Fascia Exposed: No Necrotic Amount: Medium (34-66%) Fat Layer (Subcutaneous Tissue) Exposed: Yes Necrotic Quality: Eschar, Adherent Slough Tendon Exposed: No Muscle Exposed: No Joint Exposed: No Bone Exposed: No Periwound Skin Texture Texture Color No Abnormalities Noted: Yes No Abnormalities Noted: No Rubor: Yes Moisture No Abnormalities Noted: No Temperature / Pain Dry / Scaly: Yes Temperature: No Abnormality Maceration: No Tenderness on Palpation: Yes [name] Notes Wound #2 (Toe Fifth) Wound Laterality: Left, Medial Cleanser Soap and Water Discharge Instruction: May shower and wash wound with dial antibacterial soap and water prior to dressing change. Wound Cleanser Discharge Instruction: Cleanse the wound with wound cleanser  prior to applying a clean dressing using gauze sponges, not tissue or cotton balls. Peri-Wound Care Skin Prep Discharge Instruction: Use skin prep as directed Topical Primary Dressing Iodosorb Gel 10 (gm) Tube Discharge Instruction: Apply to wound bed as instructed Secondary Dressing Zetuvit Plus Silicone Border Dressing 4x4 (in/in) Discharge Instruction: Apply silicone border over primary dressing as directed. Secured With Transpore Surgical Tape, 2x10 (in/yd) Discharge Instruction: Secure dressing with tape as directed. Compression Lori [name], Lori [name] ([id number]) [id number] Page 10 of 10 Compression Stockings Add-Ons Electronic Signature(s) Unsigned Entered By: Lori [name] on 07/08/2022 08:33:40 --------------------------------------------------------------------------------  Vitals Details Patient Name: Date of Service: Lori Mt NNE B. 07/08/2022 8:00 A M Medical Record Number: [id number] Patient Account Number: [id number] Date of Birth/Sex: Treating RN: [birth year] (77 y.o. Lori [name], Lori [name] Primary Care Janeene Sand: Lori [name] Other Clinician: Referring Criss Bartles: Treating Jaydn Fincher/Extender: Lori [name]: 0 Vital Signs Time Taken: 08:08 Temperature (F): 98.7 Height (in): 66 Pulse (bpm): 87 Source: Stated Respiratory Rate (breaths/min): 16 Weight (lbs): 150 Blood Pressure (mmHg): 109/62 Source: Stated Reference Range: 80 - 120 mg / dl Body Mass Index (BMI): 24.2 Electronic Signature(s) Unsigned Entered ByBlanche [name] on 07/08/2022 08:13:18 Signature(s): Date(s):

## 2022-07-09 DIAGNOSIS — L97522 Non-pressure chronic ulcer of other part of left foot with fat layer exposed: Secondary | ICD-10-CM | POA: Diagnosis not present

## 2022-07-09 NOTE — Progress Notes (Signed)
MCKAELA, PHILIPP ([id number]) [phone].pdf Page 1 of 4 Visit Report for 07/08/2022 Abuse Risk Screen Details Patient Name: Date of Service: [name] 07/08/2022 8:00 A M Medical Record Number: [id number] Patient Account Number: [id number] Date of Birth/Sex: Treating RN: 06-29-1945 (77 y.o. Iver Nestle, Jamie Primary Care Zeki Bedrosian: Marton Redwood Other Clinician: Referring Cristan Hout: Treating Loel Betancur/Extender: Rupert Stacks in Treatment: 0 Abuse Risk Screen Items Answer ABUSE RISK SCREEN: Has anyone close to you tried to hurt or harm you recentlyo No Do you feel uncomfortable with anyone in your familyo No Has anyone forced you do things that you didnt want to doo No Electronic Signature(s) Signed: 07/08/2022 4:10:46 PM By: Blanche East RN Entered By: Blanche East on 07/08/2022 08:20:15 -------------------------------------------------------------------------------- Activities of Daily Living Details Patient Name: Date of Service: [name] 07/08/2022 8:00 A M Medical Record Number: [id number] Patient Account Number: [id number] Date of Birth/Sex: Treating RN: 08-07-1945 (77 y.o. Marta Lamas Primary Care Hannibal Skalla: Marton Redwood Other Clinician: Referring Zyaira Vejar: Treating Addalynne Golding/Extender: Rupert Stacks in Treatment: 0 Activities of Daily Living Items Answer Activities of Daily Living (Please select one for each item) Drive Automobile Completely Able T Medications ake Completely Able Use T elephone Completely Able Care for Appearance Completely Able Use T oilet Completely Able Bath / Shower Completely Able Dress Self Completely Able Feed Self Completely Able Walk Completely Able Get In / Out Bed Completely Able Housework Completely Able Prepare Meals Completely Able Handle Money Completely Able Shop for Self Completely Able Electronic Signature(s) Signed: 07/08/2022  4:10:46 PM By: Blanche East RN Entered By: Blanche East on 07/08/2022 08:20:38 Caryn Bee ([id number]) [phone] [id number] Page 2 of 4 -------------------------------------------------------------------------------- Education Screening Details Patient Name: Date of Service: [name] 07/08/2022 8:00 A M Medical Record Number: [id number] Patient Account Number: [id number] Date of Birth/Sex: Treating RN: [birth year] (77 y.o. Marta Lamas Primary Care Haedyn Breau: Marton Redwood Other Clinician: Referring Clemma Johnsen: Treating Slayter Moorhouse/Extender: Rupert Stacks in Treatment: 0 Primary Learner Assessed: Patient Learning Preferences/Education Level/Primary Language Learning Preference: Explanation Highest Education Level: College or Above Preferred Language: English Cognitive Barrier Language Barrier: No Translator Needed: No Memory Deficit: No Emotional Barrier: No Cultural/Religious Beliefs Affecting Medical Care: No Physical Barrier Impaired Vision: Yes Glasses Impaired Hearing: No Decreased Hand dexterity: No Knowledge/Comprehension Knowledge Level: High Comprehension Level: High Ability to understand written instructions: High Ability to understand verbal instructions: High Motivation Anxiety Level: Calm Cooperation: Cooperative Education Importance: Acknowledges Need Interest in Health Problems: Asks Questions Perception: Coherent Willingness to Engage in Self-Management High Activities: Readiness to Engage in Self-Management High Activities: Electronic Signature(s) Signed: 07/08/2022 4:10:46 PM By: Blanche East RN Entered By: Blanche East on 07/08/2022 08:21:14 -------------------------------------------------------------------------------- Fall Risk Assessment Details Patient Name: Date of Service: [name], [name] 07/08/2022 8:00 A M Medical Record Number: [id number] Patient Account Number:  [id number] Date of Birth/Sex: Treating RN: [birth year] (77 y.o. Marta Lamas Primary Care Atwell Mcdanel: Marton Redwood Other Clinician: Referring Anselmo Reihl: Treating Janeice Stegall/Extender: Rupert Stacks in Treatment: 0 Fall Risk Assessment Items Have you had 2 or more falls in the last 12 monthso 0 No ISEL, TRUGLIO B ([id number]) [phone] [id number] Page 3 of 4 Have you had any fall that resulted in injury in the last 12 monthso 0 No FALLS RISK SCREEN History of falling - immediate or within 3 months 0 No Secondary diagnosis (Do you have 2 or more medical diagnoseso) 0  No Ambulatory aid None/bed rest/wheelchair/nurse 0 Yes Crutches/cane/walker 0 No Furniture 0 No Intravenous therapy Access/Saline/Heparin Lock 0 No Gait/Transferring Normal/ bed rest/ wheelchair 0 Yes Weak (short steps with or without shuffle, stooped but able to lift head while walking, may seek 0 No support from furniture) Impaired (short steps with shuffle, may have difficulty arising from chair, head down, impaired 0 No balance) Mental Status Oriented to own ability 0 Yes Electronic Signature(s) Signed: 07/08/2022 4:10:46 PM By: Blanche East RN Entered By: Blanche East on 07/08/2022 08:21:32 -------------------------------------------------------------------------------- Foot Assessment Details Patient Name: Date of Service: [name] 07/08/2022 8:00 A M Medical Record Number: [id number] Patient Account Number: [id number] Date of Birth/Sex: Treating RN: [birth year] (77 y.o. Marta Lamas Primary Care Cleatus Gabriel: Marton Redwood Other Clinician: Referring Brayan Votaw: Treating Nikiyah Fackler/Extender: Rupert Stacks in Treatment: 0 Foot Assessment Items Site Locations + = Sensation present, - = Sensation absent, C = Callus, U = Ulcer R = Redness, W = Warmth, M = Maceration, PU = Pre-ulcerative lesion F = Fissure, S = Swelling, D =  Dryness Assessment Right: Left: Other Deformity: No No Prior Foot Ulcer: No No Prior Amputation: No No Charcot Joint: No No Ambulatory Status: Ambulatory Without Help GaitGARI, VEITH ([id number]) [phone].pdf Page 4 of 4 Electronic Signature(s) Signed: 07/08/2022 4:10:46 PM By: Blanche East RN Entered By: Blanche East on 07/08/2022 08:22:33 -------------------------------------------------------------------------------- Nutrition Risk Screening Details Patient Name: Date of Service: [name] 07/08/2022 8:00 A M Medical Record Number: [id number] Patient Account Number: [id number] Date of Birth/Sex: Treating RN: May 29, 1945 (77 y.o. Iver Nestle, [place] Primary Care Asher Babilonia: Marton Redwood Other Clinician: Referring Xaiden Fleig: Treating Assata Juncaj/Extender: Rupert Stacks in Treatment: 0 Height (in): 66 Weight (lbs): 150 Body Mass Index (BMI): 24.2 Nutrition Risk Screening Items Score Screening NUTRITION RISK SCREEN: I have an illness or condition that made me change the kind and/or amount of food I eat 0 No I eat fewer than two meals per day 0 No I eat few fruits and vegetables, or milk products 0 No I have three or more drinks of beer, liquor or wine almost every day 0 No I have tooth or mouth problems that make it hard for me to eat 0 No I don't always have enough money to buy the food I need 0 No I eat alone most of the time 0 No I take three or more different prescribed or over-the-counter drugs a day 1 Yes Without wanting to, I have lost or gained 10 pounds in the last six months 0 No I am not always physically able to shop, cook and/or feed myself 0 No Nutrition Protocols Good Risk Protocol 0 No interventions needed Moderate Risk Protocol High Risk Proctocol Risk Level: Good Risk Score: 1 Electronic Signature(s) Signed: 07/08/2022 4:10:46 PM By: Blanche East RN Entered By: Blanche East on  07/08/2022 08:22:20

## 2022-07-09 NOTE — Progress Notes (Signed)
JERRAH, THREAT ([id number]) [id number] Page 1 of 10 Visit Report for 07/08/2022 Chief Complaint Document Details Patient Name: Date of Service: Lori [name]. 07/08/2022 8:00 A M Medical Record Number: [id number] Patient Account Number: [id number] Date of Birth/Sex: Treating RN: 04-21-1946 (77 y.o. F) Primary Care Provider: Marton Redwood Other Clinician: Referring Provider: Treating Provider/Extender: Rupert Stacks in Treatment: 0 Information Obtained from: Patient Chief Complaint Squamous cell carcinoma left leg 07/08/2022: non-healing surgical wound Electronic Signature(s) Signed: 07/08/2022 8:19:39 AM By: Fredirick Maudlin MD FACS Entered By: Fredirick Maudlin on 07/08/2022 08:19:39 -------------------------------------------------------------------------------- Debridement Details Patient Name: Date of Service: Lori [name], Lori Signs NNE [name]. 07/08/2022 8:00 A M Medical Record Number: [id number] Patient Account Number: [id number] Date of Birth/Sex: Treating RN: 1945-11-21 (77 y.o. Marta Lamas Primary Care Provider: Marton Redwood Other Clinician: Referring Provider: Treating Provider/Extender: Rupert Stacks in Treatment: 0 Debridement Performed for Assessment: Wound #2 Left,Medial T Fifth oe Performed By: Physician Fredirick Maudlin, MD Debridement Type: Debridement Level of Consciousness (Pre-procedure): Awake and Alert Pre-procedure Verification/Time Out Yes - 08:46 Taken: Start Time: 08:47 Pain Control: Lidocaine 4% T opical Solution T Area Debrided (L x W): otal 0.7 (cm) x 0.3 (cm) = 0.21 (cm) Tissue and other material debrided: Non-Viable, Eschar, Slough, Slough Level: Non-Viable Tissue Debridement Description: Selective/Open Wound Instrument: Curette Bleeding: Minimum Hemostasis Achieved: Pressure Response to Treatment: Procedure was tolerated well Level of Consciousness (Post- Awake and  Alert procedure): Post Debridement Measurements of Total Wound Length: (cm) 0.7 Width: (cm) 0.3 Depth: (cm) 0.1 Volume: (cm) 0.016 Character of Wound/Ulcer Post Debridement: Requires Further Debridement Post Procedure Diagnosis Same as Pre-procedure Lori [name] ([id number]) [phone].pdf Page 2 of 10 Notes Scribed for Dr. Celine Ahr by Blanche East, RN Electronic Signature(s) Signed: 07/08/2022 12:27:44 PM By: Fredirick Maudlin MD FACS Signed: 07/08/2022 4:10:46 PM By: Blanche East RN Entered By: Blanche East on 07/08/2022 08:50:11 -------------------------------------------------------------------------------- HPI Details Patient Name: Date of Service: Lori [name], Lori NNE [name]. 07/08/2022 8:00 A M Medical Record Number: [id number] Patient Account Number: [id number] Date of Birth/Sex: Treating RN: 11/28/1945 (77 y.o. F) Primary Care Provider: Marton Redwood Other Clinician: Referring Provider: Treating Provider/Extender: Rupert Stacks in Treatment: 0 History of Present Illness HPI Description: 04/17/2021 patient presents today for initial inspection here in our clinic concerning issues that she has been having with a wound on her left lower extremity. This was actually off 4 November surgically excised according to what she tells me by her physician and sent for pathology. It is very confusing whether or not the pathology report showed clear margins though the patient's been referred to the Mohs surgeon in order to ensure the "edges are clear". Again I am uncertain if this is something she insisted on or for physician solid evidence on the pathology report this was necessary either way I cannot be certain the squamous cell carcinoma is cleared at this point. Apparently that was the diagnosis. Either way this means I will not be performing any debridement at this time. With regard to her other major medical problems she does have hereditary  hemochromatosis as well as systemic lupus and rheumatoid arthritis. She also tells me that she has been using some unconventional things including "food grade peroxide, ethylene glycol (which she apparently found online as a treatment for her condition), and has been on doxycycline". 12/2; patient presents for 1 week follow-up. She was admitted at last clinic visit. She states that she is not able to obtain  Santyl but has recently obtained Medihoney. She has not started the Medihoney. She has no issues or complaints today. She currently denies signs of infection. 05/08/2021 upon evaluation patient appears to actually be doing quite well in regard to her wound all things considered. She has been using Medihoney and to be honest Medihoney seems to be doing awesome. I do not see any signs of active infection locally nor systemically at this point. No fevers, chills, nausea, vomiting, or diarrhea. 05/29/2021 upon evaluation today patient appears to be doing well in fact she appears to be completely healed she has been using the Medihoney. She tells me that within the next 2 weeks or so she does have the appointment with the Mohs surgeon again this was a skin cancer site and its not definitive that it was all gotten initially. READMISSION 07/08/2022 This is a now 78 year old woman with mixed connective tissue disease, among other comorbidities. In December of last year, she underwent a left fifth toe hammertoe repair and exostectomy. She apparently has a history of self treating with various over-the-counter concoctions and after her sutures were removed, she presented back to the podiatrist office with a localized dehiscence of her wound with skin irritation, believed to be related to her self-medication attempts. She has been on oral doxycycline and topical Iodosorb. X-rays were performed that were interpreted by podiatry to be inconclusive but no bone infection was felt to be present. She saw infectious  disease on February 7 who did not think she needed long-term antibiotics. She apparently takes a suppressive dose of doxycycline for urinary issues. The medial aspect of her left fifth toe is encrusted with Iodosorb. Underneath this, there is a wound that is roughly half a centimeter in length and about 3 mm in depth. It does not probe to bone. There is slough accumulation. Electronic Signature(s) Signed: 07/08/2022 9:28:36 AM By: Fredirick Maudlin MD FACS Previous Signature: 07/08/2022 8:24:38 AM Version By: Fredirick Maudlin MD FACS Entered By: Fredirick Maudlin on 07/08/2022 09:28:36 -------------------------------------------------------------------------------- Physical Exam Details Patient Name: Date of Service: Lori [name]. 07/08/2022 8:00 A M Medical Record Number: [id number] Patient Account Number: [id number] Date of Birth/Sex: Treating RN: 1946-05-14 (77 y.o. F) Primary Care Provider: Marton Redwood Other Clinician: Caryn [name] ([id number]) [id number] Page 3 of 10 Referring Provider: Treating Provider/Extender: Rupert Stacks in Treatment: 0 Constitutional . . . . No acute distress. Respiratory Normal work of breathing on room air. Notes 07/08/2022: The medial aspect of her left fifth toe is encrusted with Iodosorb. Underneath this, there is a wound that is roughly half a centimeter in length and about 3 mm in depth. It does not probe to bone. There is slough accumulation. Electronic Signature(s) Signed: 07/08/2022 9:29:02 AM By: Fredirick Maudlin MD FACS Entered By: Fredirick Maudlin on 07/08/2022 09:29:02 -------------------------------------------------------------------------------- Physician Orders Details Patient Name: Date of Service: Lori [name]. 07/08/2022 8:00 A M Medical Record Number: [id number] Patient Account Number: [id number] Date of Birth/Sex: Treating RN: 08/03/1945 (77 y.o. Marta Lamas Primary Care Provider: Marton Redwood Other Clinician: Referring Provider: Treating Provider/Extender: Rupert Stacks in Treatment: 0 Verbal / Phone Orders: No Diagnosis Coding ICD-10 Coding Code Description [phone] Non-pressure chronic ulcer of other part of left foot with fat layer exposed M35.1 Other overlap syndromes I10 Essential (primary) hypertension Follow-up Appointments ppointment in 1 week. - Dr. Celine Ahr Return A Anesthetic (In clinic) Topical Lidocaine 4% applied to wound bed Bathing/ Shower/ Hygiene May shower  and wash wound with soap and water. Edema Control - Lymphedema / SCD / Other Left Lower Extremity Elevate legs to the level of the heart or above for 30 minutes daily and/or when sitting for 3-4 times a day throughout the day. Avoid standing for long periods of time. Off-Loading Wound #2 Left,Medial T Fifth oe Open toe surgical shoe to: Wound Treatment Wound #2 - T Fifth oe Wound Laterality: Left, Medial Cleanser: Soap and Water 1 x Per Day/30 Days Discharge Instructions: May shower and wash wound with dial antibacterial soap and water prior to dressing change. Cleanser: Wound Cleanser 1 x Per Day/30 Days Discharge Instructions: Cleanse the wound with wound cleanser prior to applying a clean dressing using gauze sponges, not tissue or cotton balls. Peri-Wound Care: Skin Prep (DME) (Dispense As Written) 1 x Per Day/30 Days Discharge Instructions: Use skin prep as directed Prim Dressing: Iodosorb Gel 10 (gm) Tube (DME) (Generic) 1 x Per Day/30 Days ary Discharge Instructions: Apply to wound bed as instructed CLIDA, OGLETREE ([id number]) [phone].pdf Page 4 of 10 Secondary Dressing: Zetuvit Plus Silicone Border Dressing 4x4 (in/in) (DME) (Dispense As Written) 1 x Per Day/30 Days Discharge Instructions: Apply silicone border over primary dressing as directed. Secured With: Transpore Surgical Tape, 2x10  (in/yd) 1 x Per Day/30 Days Discharge Instructions: Secure dressing with tape as directed. Patient Medications llergies: erythromycin base, Iodinated Contrast Media, albumin human, amoxicillin, codeine, Keflex, Percocet, shellfish derived, meperidine, metoclopramide, A Percocet, oxycodone Notifications Medication Indication Start End 07/08/2022 lidocaine DOSE topical 4 % cream - cream topical Electronic Signature(s) Signed: 07/08/2022 12:27:44 PM By: Fredirick Maudlin MD FACS Entered By: Fredirick Maudlin on 07/08/2022 [id number] Prescription 07/08/2022 -------------------------------------------------------------------------------- Lori Hirschfeld [name]. Fredirick Maudlin MD Patient Name: Provider: 15-Dec-1945 [id number] Date of Birth: NPI#: [id number] Sex: DEA #: [phone] [id number] Phone #: License #: [place] Patient Address: [address] [place], [place] [address] [place], [place] [address] [phone] Allergies erythromycin base; Iodinated Contrast Media; albumin human; amoxicillin; codeine; Keflex; Percocet; shellfish derived; meperidine; metoclopramide; Percocet; oxycodone Medication Medication: Route: Strength: Form: lidocaine 4 % topical cream topical 4% cream Class: TOPICAL LOCAL ANESTHETICS Dose: Frequency / Time: Indication: cream topical Number of Refills: Number of Units: 0 Generic Substitution: Start Date: End Date: One Time Use: Substitution Permitted [id number] No Note to Pharmacy: Hand Signature: Date(s): Electronic Signature(s) Signed: 07/08/2022 9:30:54 AM By: Fredirick Maudlin MD FACS Entered By: Fredirick Maudlin on 07/08/2022 09:30:54 Lori [name] ([id number]) [id number] Page 5 of 10 -------------------------------------------------------------------------------- Problem List Details Patient Name: Date of Service: Lori [name].  07/08/2022 8:00 A M Medical Record Number: [id number] Patient Account Number: [id number] Date of Birth/Sex: Treating RN: [birth year] (77 y.o. F) Primary Care Provider: Marton Redwood Other Clinician: Referring Provider: Treating Provider/Extender: Rupert Stacks in Treatment: 0 Active Problems ICD-10 Encounter Code Description Active Date MDM Diagnosis L97.522 Non-pressure chronic ulcer of other part of left foot with fat layer exposed 07/08/2022 No Yes M35.1 Other overlap syndromes 07/08/2022 No Yes I10 Essential (primary) hypertension 07/08/2022 No Yes Inactive Problems Resolved Problems Electronic Signature(s) Signed: 07/08/2022 8:19:15 AM By: Fredirick Maudlin MD FACS Entered By: Fredirick Maudlin on 07/08/2022 08:19:15 -------------------------------------------------------------------------------- Progress Note Details Patient Name: Date of Service: Lori [name]. 07/08/2022 8:00 A M Medical Record Number: [id number] Patient Account Number: [id number] Date of Birth/Sex: Treating RN: 1945/07/17 (78 y.o. F) Primary Care Provider: Marton Redwood Other Clinician: Referring Provider:  Treating Provider/Extender: Rupert Stacks in Treatment: 0 Subjective Chief Complaint Information obtained from Patient Squamous cell carcinoma left leg 07/08/2022: non-healing surgical wound History of Present Illness (HPI) 04/17/2021 patient presents today for initial inspection here in our clinic concerning issues that she has been having with a wound on her left lower extremity. This was actually off 4 November surgically excised according to what she tells me by her physician and sent for pathology. It is very confusing whether or not the pathology report showed clear margins though the patient's been referred to the Mohs surgeon in order to ensure the "edges are clear". Again I am uncertain if this is something she insisted on or for physician solid  evidence on the pathology report this was necessary either way I cannot be certain the squamous cell carcinoma is cleared at this point. Apparently that was the diagnosis. Either way this means I will not be performing any debridement at this time. With regard to her other major medical problems she does have hereditary hemochromatosis as well as systemic lupus and rheumatoid arthritis. She also tells me that she has been using some unconventional things including "food grade peroxide, ethylene glycol (which she apparently found online as a treatment for her condition), and has been on doxycycline". 12/2; patient presents for 1 week follow-up. She was admitted at last clinic visit. She states that she is not able to obtain Santyl but has recently obtained Medihoney. She has not started the Medihoney. She has no issues or complaints today. She currently denies signs of infection. 05/08/2021 upon evaluation patient appears to actually be doing quite well in regard to her wound all things considered. She has been using Medihoney and to be honest Medihoney seems to be doing awesome. I do not see any signs of active infection locally nor systemically at this point. No fevers, chills, nausea, vomiting, or diarrhea. 05/29/2021 upon evaluation today patient appears to be doing well in fact she appears to be completely healed she has been using the Medihoney. She tells me TACARI, STEADMAN ([id number]) [phone].pdf Page 6 of 10 that within the next 2 weeks or so she does have the appointment with the Mohs surgeon again this was a skin cancer site and its not definitive that it was all gotten initially. READMISSION 07/08/2022 This is a now 77 year old woman with mixed connective tissue disease, among other comorbidities. In December of last year, she underwent a left fifth toe hammertoe repair and exostectomy. She apparently has a history of self treating with various over-the-counter  concoctions and after her sutures were removed, she presented back to the podiatrist office with a localized dehiscence of her wound with skin irritation, believed to be related to her self-medication attempts. She has been on oral doxycycline and topical Iodosorb. X-rays were performed that were interpreted by podiatry to be inconclusive but no bone infection was felt to be present. She saw infectious disease on February 7 who did not think she needed long-term antibiotics. She apparently takes a suppressive dose of doxycycline for urinary issues. The medial aspect of her left fifth toe is encrusted with Iodosorb. Underneath this, there is a wound that is roughly half a centimeter in length and about 3 mm in depth. It does not probe to bone. There is slough accumulation. Patient History Information obtained from Patient. Allergies albumin human, amoxicillin, codeine, Keflex, Percocet, shellfish derived, erythromycin base (Severity: Severe, Reaction: nausea), Iodinated Contrast Media (Severity: Moderate), meperidine, metoclopramide, Percocet, oxycodone (Reaction: nausea/ vomiting)  Family History Cancer - Father, Diabetes - Father, Heart Disease - Father, No family history of Hereditary Spherocytosis, Hypertension, Kidney Disease, Lung Disease, Seizures, Stroke, Thyroid Problems, Tuberculosis. Social History Never smoker, Marital Status - Married, Alcohol Use - Rarely - wine, Drug Use - No History, Caffeine Use - Rarely. Medical History Eyes Patient has history of Glaucoma Denies history of Cataracts, Optic Neuritis Ear/Nose/Mouth/Throat Denies history of Chronic sinus problems/congestion, Middle ear problems Hematologic/Lymphatic Denies history of Anemia, Hemophilia, Human Immunodeficiency Virus, Lymphedema, Sickle Cell Disease Respiratory Patient has history of Asthma Denies history of Aspiration, Chronic Obstructive Pulmonary Disease (COPD), Pneumothorax, Sleep Apnea,  Tuberculosis Cardiovascular Denies history of Angina, Arrhythmia, Congestive Heart Failure, Coronary Artery Disease, Deep Vein Thrombosis, Hypertension, Hypotension, Myocardial Infarction, Peripheral Arterial Disease, Peripheral Venous Disease, Phlebitis, Vasculitis Endocrine Denies history of Type I Diabetes, Type II Diabetes Genitourinary Denies history of End Stage Renal Disease Immunological Patient has history of Lupus Erythematosus - Hx of LUPUS Integumentary (Skin) Denies history of History of Burn Musculoskeletal Patient has history of Rheumatoid Arthritis Denies history of Gout, Osteoarthritis, Osteomyelitis Neurologic Denies history of Dementia, Neuropathy, Quadriplegia, Paraplegia, Seizure Disorder Psychiatric Denies history of Anorexia/bulimia, Confinement Anxiety Hospitalization/Surgery History - Lumpectomy (From R Breast). - Tubal Ligation. Medical A Surgical History Notes nd Eyes Pt. stated she was once diagnosed with glaucoma Ear/Nose/Mouth/Throat Chronic vasomotor rhinitis Gastrointestinal As per pt. has autoimmune hepatitis Integumentary (Skin) Cancer-squamous cell removed from Forehead Oncologic Cancer-Hx Squamous cell removed from forehead Review of Systems (ROS) Gastrointestinal esophageal reflux Objective Constitutional No acute distress. LALITHA, MAGSINO ([id number]) [id number] Page 7 of 10 Vitals Time Taken: 8:08 AM, Height: 66 in, Source: Stated, Weight: 150 lbs, Source: Stated, BMI: 24.2, Temperature: 98.7 F, Pulse: 87 bpm, Respiratory Rate: 16 breaths/min, Blood Pressure: 109/62 mmHg. Respiratory Normal work of breathing on room air. General Notes: 07/08/2022: The medial aspect of her left fifth toe is encrusted with Iodosorb. Underneath this, there is a wound that is roughly half a centimeter in length and about 3 mm in depth. It does not probe to bone. There is slough accumulation. Integumentary (Hair,  Skin) Wound #2 status is Open. Original cause of wound was Surgical Injury. The date acquired was: 05/28/2022. The wound is located on the [place]. The oe wound measures 0.7cm length x 0.3cm width x 0.1cm depth; 0.165cm^2 area and 0.016cm^3 volume. There is Fat Layer (Subcutaneous Tissue) exposed. There is no tunneling or undermining noted. There is a medium amount of serous drainage noted. There is medium (34-66%) pink granulation within the wound bed. There is a medium (34-66%) amount of necrotic tissue within the wound bed including Eschar and Adherent Slough. The periwound skin appearance had no abnormalities noted for texture. The periwound skin appearance exhibited: Dry/Scaly, Rubor. The periwound skin appearance did not exhibit: Maceration. Periwound temperature was noted as No Abnormality. The periwound has tenderness on palpation. Assessment Active Problems ICD-10 Non-pressure chronic ulcer of other part of left foot with fat layer exposed Other overlap syndromes Essential (primary) hypertension Procedures Wound #2 Pre-procedure diagnosis of Wound #2 is a Dehisced Wound located on the Left,Medial T Fifth . There was a Selective/Open Wound Non-Viable Tissue oe Debridement with a total area of 0.21 sq cm performed by Fredirick Maudlin, MD. With the following instrument(s): Curette to remove Non-Viable tissue/material. Material removed includes Eschar and Slough and after achieving pain control using Lidocaine 4% Topical Solution. No specimens were taken. A time out was conducted at 08:46, prior to the start of the procedure.  A Minimum amount of bleeding was controlled with Pressure. The procedure was tolerated well. Post Debridement Measurements: 0.7cm length x 0.3cm width x 0.1cm depth; 0.016cm^3 volume. Character of Wound/Ulcer Post Debridement requires further debridement. Post procedure Diagnosis Wound #2: Same as Pre-Procedure General Notes: Scribed for Dr. Celine Ahr by  Blanche East, RN. Plan Follow-up Appointments: Return Appointment in 1 week. - Dr. Celine Ahr Anesthetic: (In clinic) Topical Lidocaine 4% applied to wound bed Bathing/ Shower/ Hygiene: May shower and wash wound with soap and water. Edema Control - Lymphedema / SCD / Other: Elevate legs to the level of the heart or above for 30 minutes daily and/or when sitting for 3-4 times a day throughout the day. Avoid standing for long periods of time. Off-Loading: Wound #2 Left,Medial T Fifth: oe Open toe surgical shoe to: The following medication(s) was prescribed: lidocaine topical 4 % cream cream topical starting 07/08/2022 WOUND #2: - T Fifth Wound Laterality: Left, Medial oe Cleanser: Soap and Water 1 x Per Day/30 Days Discharge Instructions: May shower and wash wound with dial antibacterial soap and water prior to dressing change. Cleanser: Wound Cleanser 1 x Per Day/30 Days Discharge Instructions: Cleanse the wound with wound cleanser prior to applying a clean dressing using gauze sponges, not tissue or cotton balls. Peri-Wound Care: Skin Prep (DME) (Dispense As Written) 1 x Per Day/30 Days Discharge Instructions: Use skin prep as directed Prim Dressing: Iodosorb Gel 10 (gm) Tube (DME) (Generic) 1 x Per Day/30 Days ary Discharge Instructions: Apply to wound bed as instructed Secondary Dressing: Zetuvit Plus Silicone Border Dressing 4x4 (in/in) (DME) (Dispense As Written) 1 x Per Day/30 Days Discharge Instructions: Apply silicone border over primary dressing as directed. Secured With: Transpore Surgical T ape, 2x10 (in/yd) 1 x Per Day/30 Days Discharge Instructions: Secure dressing with tape as directed. 07/08/2022: She returns with a surgical wound from a hammertoe deformity correction. The medial aspect of her left fifth toe is encrusted with Iodosorb. Underneath this, there is a wound that is roughly half a centimeter in length and about 3 mm in depth. It does not probe to bone. There is  slough SHRILEY, CANE ([id number]) [phone].pdf Page 8 of 10 accumulation. I used a curette to debride eschar and slough from the wound. As she already has Iodosorb, I think she can continue to use this. I did recommend that she wash her wound daily with soap and water, taking care to dry it thoroughly before applying her dressing. She will follow-up in 1 week. Electronic Signature(s) Signed: 07/08/2022 9:30:22 AM By: Fredirick Maudlin MD FACS Entered By: Fredirick Maudlin on 07/08/2022 09:30:22 -------------------------------------------------------------------------------- HxROS Details Patient Name: Date of Service: Lori [name], Lori NNE [name]. 07/08/2022 8:00 A M Medical Record Number: [id number] Patient Account Number: [id number] Date of Birth/Sex: Treating RN: 31-Dec-1945 (77 y.o. Marta Lamas Primary Care Provider: Marton Redwood Other Clinician: Referring Provider: Treating Provider/Extender: Rupert Stacks in Treatment: 0 Information Obtained From Patient Eyes Medical History: Positive for: Glaucoma Negative for: Cataracts; Optic Neuritis Past Medical History Notes: Pt. stated she was once diagnosed with glaucoma Ear/Nose/Mouth/Throat Medical History: Negative for: Chronic sinus problems/congestion; Middle ear problems Past Medical History Notes: Chronic vasomotor rhinitis Hematologic/Lymphatic Medical History: Negative for: Anemia; Hemophilia; Human Immunodeficiency Virus; Lymphedema; Sickle Cell Disease Respiratory Medical History: Positive for: Asthma Negative for: Aspiration; Chronic Obstructive Pulmonary Disease (COPD); Pneumothorax; Sleep Apnea; Tuberculosis Cardiovascular Medical History: Negative for: Angina; Arrhythmia; Congestive Heart Failure; Coronary Artery Disease; Deep Vein Thrombosis; Hypertension; Hypotension; Myocardial Infarction; Peripheral  Arterial Disease; Peripheral Venous Disease; Phlebitis;  Vasculitis Gastrointestinal Complaints and Symptoms: Review of System Notes: esophageal reflux Medical History: Past Medical History Notes: As per pt. 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 Musculoskeletal Medical History: Positive for: Rheumatoid Arthritis Negative for: Gout; Osteoarthritis; Osteomyelitis Neurologic Medical History: Negative for: Dementia; Neuropathy; Quadriplegia; Paraplegia; Seizure Disorder Oncologic Medical History: Past Medical History Notes: Cancer-Hx Squamous cell removed from forehead Psychiatric Medical History: Negative for: Anorexia/bulimia; Confinement Anxiety HBO Extended History Items Eyes: Glaucoma Immunizations Pneumococcal Vaccine: Received Pneumococcal Vaccination: Yes Received Pneumococcal Vaccination On or After 60th Birthday: No Implantable Devices None Hospitalization / Surgery History Type of Hospitalization/Surgery Lumpectomy (From R Breast) Tubal Ligation Family and Social History Cancer: Yes - Father; Diabetes: Yes - Father; Heart Disease: Yes - Father; Hereditary Spherocytosis: No; Hypertension: No; Kidney Disease: No; Lung Disease: No; Seizures: No; Stroke: No; Thyroid Problems: No; Tuberculosis: No; Never smoker; Marital Status - Married; Alcohol Use: Rarely - wine; Drug Use: No History; Caffeine Use: Rarely; Financial Concerns: No; Food, Clothing or Shelter Needs: No; Support System Lacking: No; Transportation Concerns: No Physician Affirmation I have reviewed and agree with the above  information. Electronic Signature(s) Signed: 07/08/2022 12:27:44 PM By: Fredirick Maudlin MD FACS Signed: 07/08/2022 4:10:46 PM By: Blanche East RN Entered By: Blanche East on 07/08/2022 08:19:16 Lori [name] ([id number]) [id number] Page 10 of 10 -------------------------------------------------------------------------------- SuperBill Details Patient Name: Date of Service: Lori [name]. 07/08/2022 Medical Record Number: [id number] Patient Account Number: [id number] Date of Birth/Sex: Treating RN: 09-23-1945 (77 y.o. 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 I10 Essential (primary) hypertension Facility Procedures : CPT4 Code: [id number] Description: 99214 - WOUND CARE VISIT-LEV 4 EST PT Modifier: 25 Quantity: 1 : CPT4 Code: [id number] Description: [id number] - DEBRIDE WOUND 1ST 20 SQ CM OR < ICD-10 Diagnosis Description L97.522 Non-pressure chronic ulcer of other part of left foot with fat layer exposed Modifier: Quantity: 1 Physician Procedures : CPT4 Code Description Modifier [id number] - WC PHYS LEVEL 4 - EST PT 25 ICD-10 Diagnosis Description L97.522 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

## 2022-07-10 ENCOUNTER — Encounter: Payer: PPO | Admitting: Podiatry

## 2022-07-11 ENCOUNTER — Encounter: Payer: Self-pay | Admitting: Podiatry

## 2022-07-11 ENCOUNTER — Ambulatory Visit (INDEPENDENT_AMBULATORY_CARE_PROVIDER_SITE_OTHER): Payer: PPO | Admitting: Podiatry

## 2022-07-11 ENCOUNTER — Ambulatory Visit (INDEPENDENT_AMBULATORY_CARE_PROVIDER_SITE_OTHER): Payer: PPO

## 2022-07-11 DIAGNOSIS — M2042 Other hammer toe(s) (acquired), left foot: Secondary | ICD-10-CM

## 2022-07-11 NOTE — Progress Notes (Signed)
Subjective:   Patient ID: Lori [name], female   DOB: 77 y.o.   MRN: [id number]   HPI Patient states she has been doing much better she did have the area cleaned out as I had wanted to do and she is not using the same substances on it that she was in the beginning which seem to really irritate her skin and probably opened up this area slightly   ROS      Objective:  Physical Exam  Neurovascular status intact fifth digit left healing well small amount of crusted tissue on the inside no drainage no other pathology with patient not taking any antibiotics currently still does use topical honey on the area to reduce any bacterial count     Assessment:  Patient is looking excellent from her fifth digit surgery left and is no longer using products that I think were probably acting to irritate tissue.     Plan:  H&P done x-ray reviewed advised this patient to continue with bandage uses when she is out and about and let it air dry when she is at home and not use any of the products that cause tissue irritation.  Patient will do this for May and at this point I do think she has healed completely should be fine and I am discharging with strict instructions if any drainage redness or other issues were to occur to let us know immediately  X-rays indicate that the bone structure looks good no indications of pathology from that perspective

## 2022-07-15 ENCOUNTER — Ambulatory Visit (HOSPITAL_BASED_OUTPATIENT_CLINIC_OR_DEPARTMENT_OTHER): Payer: PPO | Admitting: General Surgery

## 2022-07-16 ENCOUNTER — Ambulatory Visit (HOSPITAL_BASED_OUTPATIENT_CLINIC_OR_DEPARTMENT_OTHER): Payer: PPO | Admitting: Internal Medicine

## 2022-07-22 ENCOUNTER — Ambulatory Visit (HOSPITAL_BASED_OUTPATIENT_CLINIC_OR_DEPARTMENT_OTHER): Payer: PPO | Admitting: General Surgery

## 2022-07-22 ENCOUNTER — Ambulatory Visit: Payer: PPO | Admitting: Infectious Diseases

## 2022-07-24 DIAGNOSIS — J069 Acute upper respiratory infection, unspecified: Secondary | ICD-10-CM | POA: Diagnosis not present

## 2022-08-13 ENCOUNTER — Encounter (HOSPITAL_BASED_OUTPATIENT_CLINIC_OR_DEPARTMENT_OTHER): Payer: PPO | Attending: General Surgery | Admitting: General Surgery

## 2022-08-13 DIAGNOSIS — I10 Essential (primary) hypertension: Secondary | ICD-10-CM | POA: Diagnosis not present

## 2022-08-13 DIAGNOSIS — L97522 Non-pressure chronic ulcer of other part of left foot with fat layer exposed: Secondary | ICD-10-CM | POA: Diagnosis not present

## 2022-08-13 DIAGNOSIS — T8189XA Other complications of procedures, not elsewhere classified, initial encounter: Secondary | ICD-10-CM | POA: Diagnosis not present

## 2022-08-13 DIAGNOSIS — M069 Rheumatoid arthritis, unspecified: Secondary | ICD-10-CM | POA: Diagnosis not present

## 2022-08-13 DIAGNOSIS — M329 Systemic lupus erythematosus, unspecified: Secondary | ICD-10-CM | POA: Insufficient documentation

## 2022-08-13 DIAGNOSIS — J45909 Unspecified asthma, uncomplicated: Secondary | ICD-10-CM | POA: Insufficient documentation

## 2022-08-14 NOTE — Progress Notes (Signed)
KAITLEY, COSLETT ([id number]) [id number] Page 1 of 7 Visit Report for 08/13/2022 Chief Complaint Document Details Patient Name: Date of Service: Lori [name] 08/13/2022 12:45 PM Medical Record Number: [id number] Patient Account Number: [id number] Date of Birth/Sex: Treating RN: 03/05/1946 (77 y.o. F) Primary Care Provider: Marton Redwood Other Clinician: Referring Provider: Treating Provider/Extender: Bethena Roys in Treatment: 5 Information Obtained from: Patient Chief Complaint Squamous cell carcinoma left leg 07/08/2022: non-healing surgical wound Electronic Signature(s) Signed: 08/13/2022 1:43:07 PM By: Fredirick Maudlin MD FACS Entered By: Fredirick Maudlin on 08/13/2022 13:43:07 -------------------------------------------------------------------------------- HPI Details Patient Name: Date of Service: Lori Mt NNE B. 08/13/2022 12:45 PM Medical Record Number: [id number] Patient Account Number: [id number] Date of Birth/Sex: Treating RN: Jul 25, 1945 (77 y.o. F) Primary Care Provider: Marton Redwood Other Clinician: Referring Provider: Treating Provider/Extender: Bethena Roys in Treatment: 5 History of Present Illness HPI Description: 04/17/2021 patient presents today for initial inspection here in our clinic concerning issues that she has been having with a wound on her left lower extremity. This was actually off 4 November surgically excised according to what she tells me by her physician and sent for pathology. It is very confusing whether or not the pathology report showed clear margins though the patient's been referred to the Mohs surgeon in order to ensure the "edges are clear". Again I am uncertain if this is something she insisted on or for physician solid evidence on the pathology report this was necessary either way I cannot be certain the squamous cell carcinoma is cleared at this point.  Apparently that was the diagnosis. Either way this means I will not be performing any debridement at this time. With regard to her other major medical problems she does have hereditary hemochromatosis as well as systemic lupus and rheumatoid arthritis. She also tells me that she has been using some unconventional things including "food grade peroxide, ethylene glycol (which she apparently found online as a treatment for her condition), and has been on doxycycline". 12/2; patient presents for 1 week follow-up. She was admitted at last clinic visit. She states that she is not able to obtain Santyl but has recently obtained Medihoney. She has not started the Medihoney. She has no issues or complaints today. She currently denies signs of infection. 05/08/2021 upon evaluation patient appears to actually be doing quite well in regard to her wound all things considered. She has been using Medihoney and to be honest Medihoney seems to be doing awesome. I do not see any signs of active infection locally nor systemically at this point. No fevers, chills, nausea, vomiting, or diarrhea. 05/29/2021 upon evaluation today patient appears to be doing well in fact she appears to be completely healed she has been using the Medihoney. She tells me that within the next 2 weeks or so she does have the appointment with the Mohs surgeon again this was a skin cancer site and its not definitive that it was all gotten initially. READMISSION 07/08/2022 This is a now 77 year old woman with mixed connective tissue disease, among other comorbidities. In December of last year, she underwent a left fifth toe hammertoe repair and exostectomy. She apparently has a history of self treating with various over-the-counter concoctions and after her sutures were removed, she presented back to the podiatrist office with a localized dehiscence of her wound with skin irritation, believed to be related to her self-medication attempts. She has  been on oral doxycycline and topical Iodosorb. X-rays were performed that  were interpreted by podiatry to be inconclusive but no bone infection was felt to be present. She saw infectious disease on February 7 who did not think she needed long-term antibiotics. She apparently takes a suppressive dose of doxycycline for urinary issues. The medial aspect of her left fifth toe is encrusted with Iodosorb. Underneath this, there is a wound that is roughly half a centimeter in length and about 3 mm in depth. It does not probe to bone. There is slough accumulation. 08/13/2022: The patient returns after a long absence. Her wound is healed. Electronic Signature(s) Signed: 08/13/2022 1:43:25 PM By: Fredirick Maudlin MD FACS Entered By: Fredirick Maudlin on 08/13/2022 13:43:25 Lori [name] ([id number]) [id number] Page 2 of 7 -------------------------------------------------------------------------------- Physical Exam Details Patient Name: Date of Service: Lori Mt NNE B. 08/13/2022 12:45 PM Medical Record Number: [id number] Patient Account Number: [id number] Date of Birth/Sex: Treating RN: 01/21/1946 (77 y.o. F) Primary Care Provider: Marton Redwood Other Clinician: Referring Provider: Treating Provider/Extender: Bethena Roys in Treatment: 5 Constitutional . . . . no acute distress. Respiratory Normal work of breathing on room air. Notes 08/13/2022: Her wound is healed. Electronic Signature(s) Signed: 08/13/2022 1:44:02 PM By: Fredirick Maudlin MD FACS Entered By: Fredirick Maudlin on 08/13/2022 13:44:02 -------------------------------------------------------------------------------- Physician Orders Details Patient Name: Date of Service: Lori Mt NNE B. 08/13/2022 12:45 PM Medical Record Number: [id number] Patient Account Number: [id number] Date of Birth/Sex: Treating RN: 29-Jul-1945 (77 y.o. Marta Lamas Primary Care Provider:  Marton Redwood Other Clinician: Referring Provider: Treating Provider/Extender: Bethena Roys in Treatment: 5 Verbal / Phone Orders: No Diagnosis Coding ICD-10 Coding Code Description [phone] Non-pressure chronic ulcer of other part of left foot with fat layer exposed M35.1 Other overlap syndromes I10 Essential (primary) hypertension [facility]charge [facility] Services Discharge from [facility]!! Anesthetic (In clinic) Topical Lidocaine 4% applied to wound bed Bathing/ Shower/ Hygiene May shower and wash wound with soap and water. Edema Control - Lymphedema / SCD / Other Left Lower Extremity Elevate legs to the level of the heart or above for 30 minutes daily and/or when sitting for 3-4 times a day throughout the day. Avoid standing for long periods of time. Exercise regularly - as tolerated. Moisturize legs daily. Electronic Signature(s) Signed: 08/13/2022 1:45:18 PM By: Fredirick Maudlin MD FACS Entered By: Fredirick Maudlin on 08/13/2022 13:45:18 -------------------------------------------------------------------------------- Problem List Details Patient Name: Date of Service: Lori Mt NNE B. 08/13/2022 12:45 PM Medical Record Number: [id number] Patient Account Number: [id number] MAAT, BAUGUESS ([id number]) [phone].pdf Page 3 of 7 Date of Birth/Sex: Treating RN: January 08, 1946 (77 y.o. F) Primary Care Provider: Other Clinician: Marton Redwood Referring Provider: Treating Provider/Extender: Bethena Roys in Treatment: 5 Active Problems ICD-10 Encounter Code Description Active Date MDM Diagnosis L97.522 Non-pressure chronic ulcer of other part of left foot with fat layer exposed 07/08/2022 No Yes M35.1 Other overlap syndromes 07/08/2022 No Yes I10 Essential (primary) hypertension 07/08/2022 No Yes Inactive Problems Resolved Problems Electronic Signature(s) Signed: 08/13/2022  1:42:55 PM By: Fredirick Maudlin MD FACS Entered By: Fredirick Maudlin on 08/13/2022 13:42:55 -------------------------------------------------------------------------------- Progress Note Details Patient Name: Date of Service: Lori Mt NNE B. 08/13/2022 12:45 PM Medical Record Number: [id number] Patient Account Number: [id number] Date of Birth/Sex: Treating RN: 1945-12-27 (77 y.o. F) Primary Care Provider: Marton Redwood Other Clinician: Referring Provider: Treating Provider/Extender: Bethena Roys in Treatment: 5 Subjective Chief Complaint Information obtained from Patient Squamous cell carcinoma left leg 07/08/2022: non-healing  surgical wound History of Present Illness (HPI) 04/17/2021 patient presents today for initial inspection here in our clinic concerning issues that she has been having with a wound on her left lower extremity. This was actually off 4 November surgically excised according to what she tells me by her physician and sent for pathology. It is very confusing whether or not the pathology report showed clear margins though the patient's been referred to the Mohs surgeon in order to ensure the "edges are clear". Again I am uncertain if this is something she insisted on or for physician solid evidence on the pathology report this was necessary either way I cannot be certain the squamous cell carcinoma is cleared at this point. Apparently that was the diagnosis. Either way this means I will not be performing any debridement at this time. With regard to her other major medical problems she does have hereditary hemochromatosis as well as systemic lupus and rheumatoid arthritis. She also tells me that she has been using some unconventional things including "food grade peroxide, ethylene glycol (which she apparently found online as a treatment for her condition), and has been on doxycycline". 12/2; patient presents for 1 week follow-up. She was admitted at  last clinic visit. She states that she is not able to obtain Santyl but has recently obtained Medihoney. She has not started the Medihoney. She has no issues or complaints today. She currently denies signs of infection. 05/08/2021 upon evaluation patient appears to actually be doing quite well in regard to her wound all things considered. She has been using Medihoney and to be honest Medihoney seems to be doing awesome. I do not see any signs of active infection locally nor systemically at this point. No fevers, chills, nausea, vomiting, or diarrhea. 05/29/2021 upon evaluation today patient appears to be doing well in fact she appears to be completely healed she has been using the Medihoney. She tells me that within the next 2 weeks or so she does have the appointment with the Mohs surgeon again this was a skin cancer site and its not definitive that it was all gotten initially. READMISSION 07/08/2022 This is a now 77 year old woman with mixed connective tissue disease, among other comorbidities. In December of last year, she underwent a left fifth toe hammertoe repair and exostectomy. She apparently has a history of self treating with various over-the-counter concoctions and after her sutures were removed, she presented back to the podiatrist office with a localized dehiscence of her wound with skin irritation, believed to be related to her self-medication attempts. She has been on oral doxycycline and topical Iodosorb. X-rays were performed that were interpreted by podiatry to be inconclusive but no bone infection was felt to be present. She saw infectious disease on February 7 who did not think she needed long-term antibiotics. She apparently takes a suppressive dose of doxycycline for urinary issues. NOVELL, KELLING ([id number]) [id number] Page 4 of 7 The medial aspect of her left fifth toe is encrusted with Iodosorb. Underneath this, there is a wound that is roughly  half a centimeter in length and about 3 mm in depth. It does not probe to bone. There is slough accumulation. 08/13/2022: The patient returns after a long absence. Her wound is healed. Patient History Information obtained from Patient. Family History Cancer - Father, Diabetes - Father, Heart Disease - Father, No family history of Hereditary Spherocytosis, Hypertension, Kidney Disease, Lung Disease, Seizures, Stroke, Thyroid Problems, Tuberculosis. Social History Never smoker, Marital Status - Married, Alcohol Use -  Rarely - wine, Drug Use - No History, Caffeine Use - Rarely. Medical History Eyes Patient has history of Glaucoma Denies history of Cataracts, Optic Neuritis Ear/Nose/Mouth/Throat Denies history of Chronic sinus problems/congestion, Middle ear problems Hematologic/Lymphatic Denies history of Anemia, Hemophilia, Human Immunodeficiency Virus, Lymphedema, Sickle Cell Disease Respiratory Patient has history of Asthma Denies history of Aspiration, Chronic Obstructive Pulmonary Disease (COPD), Pneumothorax, Sleep Apnea, Tuberculosis Cardiovascular Denies history of Angina, Arrhythmia, Congestive Heart Failure, Coronary Artery Disease, Deep Vein Thrombosis, Hypertension, Hypotension, Myocardial Infarction, Peripheral Arterial Disease, Peripheral Venous Disease, Phlebitis, Vasculitis Endocrine Denies history of Type I Diabetes, Type II Diabetes Genitourinary Denies history of End Stage Renal Disease Immunological Patient has history of Lupus Erythematosus - Hx of LUPUS Integumentary (Skin) Denies history of History of Burn Musculoskeletal Patient has history of Rheumatoid Arthritis Denies history of Gout, Osteoarthritis, Osteomyelitis Neurologic Denies history of Dementia, Neuropathy, Quadriplegia, Paraplegia, Seizure Disorder Psychiatric Denies history of Anorexia/bulimia, Confinement Anxiety Hospitalization/Surgery History - Lumpectomy (From R Breast). - Tubal  Ligation. Medical A Surgical History Notes nd Eyes Pt. stated she was once diagnosed with glaucoma Ear/Nose/Mouth/Throat Chronic vasomotor rhinitis Gastrointestinal As per pt. has autoimmune hepatitis Integumentary (Skin) Cancer-squamous cell removed from Forehead Oncologic Cancer-Hx Squamous cell removed from forehead Objective Constitutional no acute distress. Vitals Time Taken: 12:51 PM, Height: 66 in, Weight: 150 lbs, BMI: 24.2, Temperature: 98.6 F, Pulse: 77 bpm, Respiratory Rate: 18 breaths/min, Blood Pressure: 114/65 mmHg. Respiratory Normal work of breathing on room air. General Notes: 08/13/2022: Her wound is healed. Integumentary (Hair, Skin) Wound #2 status is Healed - Epithelialized. Original cause of wound was Surgical Injury. The date acquired was: 05/28/2022. The wound has been in treatment 5 weeks. The wound is located on the [place]. The wound measures 0cm length x 0cm width x 0cm depth; 0cm^2 area and 0cm^3 volume. There is oe no tunneling or undermining noted. There is a medium amount of serous drainage noted. There is large (67-100%) pink granulation within the wound bed. There is a small (1-33%) amount of necrotic tissue within the wound bed including Eschar. The periwound skin appearance had no abnormalities noted for texture. The SKYLAAR, GEISSINGER ([id number]) [phone].pdf Page 5 of 7 periwound skin appearance exhibited: Dry/Scaly, Rubor. The periwound skin appearance did not exhibit: Maceration. Periwound temperature was noted as No Abnormality. The periwound has tenderness on palpation. Assessment Active Problems ICD-10 Non-pressure chronic ulcer of other part of left foot with fat layer exposed Other overlap syndromes Essential (primary) hypertension Plan Discharge From [facility] Services: Discharge from [facility] Anesthetic: (In clinic) Topical Lidocaine 4% applied to wound bed Bathing/  Shower/ Hygiene: May shower and wash wound with soap and water. Edema Control - Lymphedema / SCD / Other: Elevate legs to the level of the heart or above for 30 minutes daily and/or when sitting for 3-4 times a day throughout the day. Avoid standing for long periods of time. Exercise regularly - as tolerated. Moisturize legs daily. 08/13/2022: Her wound is healed. She had some questions today regarding things she could do in the future to avoid her wounds becoming infected and failing to heal. We discussed things like proper nutrition, management of her environment, and meticulous wound care. We will discharge her from the wound care center. She may follow-up as needed. Electronic Signature(s) Signed: 08/13/2022 1:46:30 PM By: Fredirick Maudlin MD FACS Entered By: Fredirick Maudlin on 08/13/2022 13:46:30 -------------------------------------------------------------------------------- HxROS Details Patient Name: Date of Service: Lori [name], [name] 08/13/2022 12:45 PM  Medical Record Number: [id number] Patient Account Number: [id number] Date of Birth/Sex: Treating RN: 07-06-1945 (77 y.o. F) Primary Care Provider: Marton Redwood Other Clinician: Referring Provider: Treating Provider/Extender: Bethena Roys in Treatment: 5 Information Obtained From Patient Eyes Medical History: Positive for: Glaucoma Negative for: Cataracts; Optic Neuritis Past Medical History Notes: Pt. stated she was once diagnosed with glaucoma Ear/Nose/Mouth/Throat Medical History: Negative for: Chronic sinus problems/congestion; Middle ear problems Past Medical History Notes: Chronic vasomotor rhinitis Hematologic/Lymphatic Medical History: Negative for: Anemia; Hemophilia; Human Immunodeficiency Virus; Lymphedema; Sickle Cell Disease JOENE, ABRAHA ([id number]) [phone].pdf Page 6 of 7 Respiratory Medical History: Positive for: Asthma Negative for:  Aspiration; Chronic Obstructive Pulmonary Disease (COPD); Pneumothorax; Sleep Apnea; Tuberculosis Cardiovascular Medical History: Negative for: Angina; Arrhythmia; Congestive Heart Failure; Coronary Artery Disease; Deep Vein Thrombosis; Hypertension; Hypotension; Myocardial Infarction; Peripheral Arterial Disease; Peripheral Venous Disease; Phlebitis; Vasculitis Gastrointestinal Medical History: Past Medical History Notes: As per pt. has autoimmune hepatitis Endocrine Medical History: Negative for: Type I Diabetes; Type II Diabetes Genitourinary Medical History: Negative for: End Stage Renal Disease Immunological Medical History: Positive for: Lupus Erythematosus - Hx of LUPUS Integumentary (Skin) Medical History: Negative for: History of Burn Past Medical History Notes: Cancer-squamous cell removed from Forehead Musculoskeletal Medical History: Positive for: Rheumatoid Arthritis Negative for: Gout; Osteoarthritis; Osteomyelitis Neurologic Medical History: Negative for: Dementia; Neuropathy; Quadriplegia; Paraplegia; Seizure Disorder Oncologic Medical History: Past Medical History Notes: Cancer-Hx Squamous cell removed from forehead Psychiatric Medical History: Negative for: Anorexia/bulimia; Confinement Anxiety HBO Extended History Items Eyes: Glaucoma Immunizations Pneumococcal Vaccine: Received Pneumococcal Vaccination: Yes Received Pneumococcal Vaccination On or After 60th Birthday: No Implantable Devices None Hospitalization / Surgery History Type of Hospitalization/Surgery Lumpectomy (From R Breast) Lori [name] ([id number]) [id number] Page 7 of 7 Tubal Ligation Family and Social History Cancer: Yes - Father; Diabetes: Yes - Father; Heart Disease: Yes - Father; Hereditary Spherocytosis: No; Hypertension: No; Kidney Disease: No; Lung Disease: No; Seizures: No; Stroke: No; Thyroid Problems: No; Tuberculosis: No; Never smoker;  Marital Status - Married; Alcohol Use: Rarely - wine; Drug Use: No History; Caffeine Use: Rarely; Financial Concerns: No; Food, Clothing or Shelter Needs: No; Support System Lacking: No; Transportation Concerns: No Engineer, maintenance) Signed: 08/13/2022 3:48:41 PM By: Fredirick Maudlin MD FACS Entered By: Fredirick Maudlin on 08/13/2022 13:43:32 -------------------------------------------------------------------------------- SuperBill Details Patient Name: Date of Service: Lori Mt NNE B. 08/13/2022 Medical Record Number: [id number] Patient Account Number: [id number] Date of Birth/Sex: Treating RN: 1946-05-01 (77 y.o. Marta Lamas Primary Care Provider: Marton Redwood Other Clinician: Referring Provider: Treating Provider/Extender: Bethena Roys in Treatment: 5 Diagnosis Coding ICD-10 Codes Code Description [phone] Non-pressure chronic ulcer of other part of left foot with fat layer exposed M35.1 Other overlap syndromes I10 Essential (primary) hypertension Facility Procedures : CPT4 Code: [id number] Description: 99213 - WOUND CARE VISIT-LEV 3 EST PT Modifier: 25 Quantity: 1 Physician Procedures : CPT4 Code Description Modifier [id number] - WC PHYS LEVEL 3 - EST PT ICD-10 Diagnosis Description L97.522 Non-pressure chronic ulcer of other part of left foot with fat layer exposed M35.1 Other overlap syndromes I10 Essential (primary) hypertension Quantity: 1 Electronic Signature(s) Signed: 08/13/2022 1:46:44 PM By: Fredirick Maudlin MD FACS Previous Signature: 08/13/2022 1:23:14 PM Version By: Blanche East RN Entered By: Fredirick Maudlin on 08/13/2022 13:46:43

## 2022-08-15 NOTE — Progress Notes (Signed)
TORIONA, DEPIES ([id number]) [id number] Page 1 of 7 Visit Report for 08/13/2022 Arrival Information Details Patient Name: Date of Service: Lori [name] 08/13/2022 12:45 PM Medical Record Number: [id number] Patient Account Number: [id number] Date of Birth/Sex: Treating RN: 15-Dec-1945 (77 y.o. Iver Nestle, Jamie Primary Care Veronda Gabor: Marton Redwood Other Clinician: Referring Burnice Vassel: Treating Myrella Fahs/Extender: Bethena Roys in Treatment: 5 Visit Information History Since Last Visit Added or deleted any medications: No Patient Arrived: Ambulatory Any new allergies or adverse reactions: No Arrival Time: 12:49 Had a fall or experienced change in No Accompanied By: self activities of daily living that may affect Transfer Assistance: None risk of falls: Signs or symptoms of abuse/neglect since last visito No Hospitalized since last visit: No Implantable device outside of the clinic excluding No cellular tissue based products placed in the center since last visit: Has Dressing in Place as Prescribed: No Pain Present Now: No Electronic Signature(s) Signed: 08/14/2022 4:19:16 PM By: Blanche East RN Entered By: Blanche East on 08/13/2022 12:51:19 -------------------------------------------------------------------------------- Clinic Level of Care Assessment Details Patient Name: Date of Service: Lori [name] 08/13/2022 12:45 PM Medical Record Number: [id number] Patient Account Number: [id number] Date of Birth/Sex: Treating RN: [birth year] (77 y.o. Iver Nestle, Burns Primary Care Taylar Hartsough: Marton Redwood Other Clinician: Referring Trayden Brandy: Treating Henri Baumler/Extender: Bethena Roys in Treatment: 5 Clinic Level of Care Assessment Items TOOL 4 Quantity Score X- 1 0 Use when only an EandM is performed on FOLLOW-UP visit ASSESSMENTS - Nursing Assessment / Reassessment X- 1 10 Reassessment of  Co-morbidities (includes updates in patient status) X- 1 5 Reassessment of Adherence to Treatment Plan ASSESSMENTS - Wound and Skin A ssessment / Reassessment X - Simple Wound Assessment / Reassessment - one wound 1 5 []  - 0 Complex Wound Assessment / Reassessment - multiple wounds []  - 0 Dermatologic / Skin Assessment (not related to wound area) ASSESSMENTS - Focused Assessment []  - 0 Circumferential Edema Measurements - multi extremities []  - 0 Nutritional Assessment / Counseling / Intervention []  - 0 Lower Extremity Assessment (monofilament, tuning fork, pulses) []  - 0 Peripheral Arterial Disease Assessment (using hand held doppler) ASSESSMENTS - Ostomy and/or Continence Assessment and Care []  - 0 Incontinence Assessment and Management []  - 0 Ostomy Care Assessment and Management (repouching, etc.) PROCESS - Coordination of Care X - Simple Patient / Family Education for ongoing care 1 15 LEAHNA, QUILLMAN ([id number]) [phone].pdf Page 2 of 7 []  - 0 Complex (extensive) Patient / Family Education for ongoing care X- 1 10 Staff obtains Consents, Records, T Results / Process Orders est X- 1 10 Staff telephones HHA, Nursing Homes / Clarify orders / etc []  - 0 Routine Transfer to another Facility (non-emergent condition) []  - 0 Routine Hospital Admission (non-emergent condition) X- 1 15 New Admissions / Biomedical engineer / Ordering NPWT Apligraf, etc. , []  - 0 Emergency Hospital Admission (emergent condition) []  - 0 Simple Discharge Coordination []  - 0 Complex (extensive) Discharge Coordination PROCESS - Special Needs []  - 0 Pediatric / Minor Patient Management []  - 0 Isolation Patient Management []  - 0 Hearing / Language / Visual special needs []  - 0 Assessment of Community assistance (transportation, D/C planning, etc.) []  - 0 Additional assistance / Altered mentation []  - 0 Support Surface(s) Assessment (bed, cushion, seat,  etc.) INTERVENTIONS - Wound Cleansing / Measurement X - Simple Wound Cleansing - one wound 1 5 []  - 0 Complex Wound Cleansing - multiple wounds X- 1 5  Wound Imaging (photographs - any number of wounds) []  - 0 Wound Tracing (instead of photographs) X- 1 5 Simple Wound Measurement - one wound []  - 0 Complex Wound Measurement - multiple wounds INTERVENTIONS - Wound Dressings []  - 0 Small Wound Dressing one or multiple wounds []  - 0 Medium Wound Dressing one or multiple wounds []  - 0 Large Wound Dressing one or multiple wounds []  - 0 Application of Medications - topical []  - 0 Application of Medications - injection INTERVENTIONS - Miscellaneous []  - 0 External ear exam []  - 0 Specimen Collection (cultures, biopsies, blood, body fluids, etc.) []  - 0 Specimen(s) / Culture(s) sent or taken to Lab for analysis []  - 0 Patient Transfer (multiple staff / Civil Service fast streamer / Similar devices) []  - 0 Simple Staple / Suture removal (25 or less) []  - 0 Complex Staple / Suture removal (26 or more) []  - 0 Hypo / Hyperglycemic Management (close monitor of Blood Glucose) []  - 0 Ankle / Brachial Index (ABI) - do not check if billed separately X- 1 5 Vital Signs Has the patient been seen at the hospital within the last three years: Yes Total Score: 90 Level Of Care: New/Established - Level 3 Electronic Signature(s) Signed: 08/14/2022 4:19:16 PM By: Blanche East RN Entered By: Blanche East on 08/13/2022 13:23:05 Caryn Bee ([id number]) [id number] Page 3 of 7 -------------------------------------------------------------------------------- Encounter Discharge Information Details Patient Name: Date of Service: Lori [name] 08/13/2022 12:45 PM Medical Record Number: [id number] Patient Account Number: [id number] Date of Birth/Sex: Treating RN: July 06, 1945 (77 y.o. Marta Lamas Primary Care Odean Fester: Marton Redwood Other Clinician: Referring  Kyarah Enamorado: Treating Jeneane Pieczynski/Extender: Bethena Roys in Treatment: 5 Encounter Discharge Information Items Discharge Condition: Stable Ambulatory Status: Ambulatory Discharge Destination: Home Transportation: Private Auto Accompanied By: self Schedule Follow-up Appointment: No Clinical Summary of Care: Provided on 08/13/2022 Form Type Recipient Paper Patient Demetria Roland Electronic Signature(s) Signed: 08/14/2022 4:19:16 PM By: Blanche East RN Entered By: Blanche East on 08/13/2022 13:07:50 -------------------------------------------------------------------------------- Lower Extremity Assessment Details Patient Name: Date of Service: [name] 08/13/2022 12:45 PM Medical Record Number: [id number] Patient Account Number: [id number] Date of Birth/Sex: Treating RN: 1945/12/08 (77 y.o. Marta Lamas Primary Care Caide Campi: Marton Redwood Other Clinician: Referring Irja Wheless: Treating Jenavee Laguardia/Extender: Bethena Roys in Treatment: 5 Edema Assessment Assessed: Shirlyn Goltz: No] [Right: No] [Left: Edema] [Right: :] Calf Left: Right: Point of Measurement: From Medial Instep 34 cm Ankle Left: Right: Point of Measurement: From Medial Instep 18.4 cm Vascular Assessment Pulses: Dorsalis Pedis Palpable: [Left:Yes] Electronic Signature(s) Signed: 08/14/2022 4:19:16 PM By: Blanche East RN Entered By: Blanche East on 08/13/2022 12:53:29 -------------------------------------------------------------------------------- Multi Wound Chart Details Patient Name: Date of Service: [name] 08/13/2022 12:45 PM Medical Record Number: [id number] Patient Account Number: [id number] Date of Birth/Sex: Treating RN: April 17, 1946 (77 y.o. F) Primary Care Jaeger Trueheart: Marton Redwood Other Clinician: Caryn Bee ([id number]) [id number] Page 4 of 7 Referring Karrissa Parchment: Treating Vanna Shavers/Extender: Bethena Roys in Treatment: 5 Vital Signs Height(in): 66 Pulse(bpm): 37 Weight(lbs): 150 Blood Pressure(mmHg): 114/65 Body Mass Index(BMI): 24.2 Temperature(F): 98.6 Respiratory Rate(breaths/min): 18 [2:Photos: No Photos Left, Medial T Fifth oe Wound Location: Surgical Injury Wounding Event: Dehisced Wound Primary Etiology: Glaucoma, Asthma, Lupus Comorbid History: Erythematosus, Rheumatoid Arthritis 05/28/2022 Date Acquired: 5 Weeks of Treatment: Healed -  Epithelialized Wound Status: No Wound Recurrence: 0x0x0 Measurements L x W x D (cm) 0 A (cm) : rea 0 Volume (cm) :  100.00% % Reduction in Area: 100.00% % Reduction in Volume: Full Thickness Without Exposed Classification: Support Structures Medium  Exudate A mount: Serous Exudate Type: amber Exudate Color: Large (67-100%) Granulation Amount: Pink Granulation Quality: Small (1-33%) Necrotic Amount: Eschar Necrotic Tissue: Fascia: No Exposed Structures: Fat Layer (Subcutaneous Tissue): No Tendon: No  Muscle: No Joint: No Bone: No Small (1-33%) Epithelialization: No Abnormalities Noted Periwound Skin Texture: Dry/Scaly: Yes Periwound Skin Moisture: Maceration: No Rubor: Yes Periwound Skin Color: No Abnormality Temperature: Yes Tenderness on  Palpation:] [N/A:N/A N/A N/A N/A N/A N/A N/A N/A N/A N/A N/A N/A N/A N/A N/A N/A N/A N/A N/A N/A N/A N/A N/A N/A N/A N/A N/A N/A N/A] Treatment Notes Wound #2 (Toe Fifth) Wound Laterality: Left, Medial Cleanser Peri-Wound Care Topical Primary Dressing Secondary Dressing Secured With Compression Wrap Compression Stockings Add-Ons Electronic Signature(s) Signed: 08/13/2022 1:43:01 PM By: Fredirick Maudlin MD FACS Entered By: Fredirick Maudlin on 08/13/2022 13:43:01 Caryn Bee ([id number]) [id number] Page 5 of 7 -------------------------------------------------------------------------------- Multi-Disciplinary Care Plan Details Patient Name: Date of  Service: Lori [name] 08/13/2022 12:45 PM Medical Record Number: [id number] Patient Account Number: [id number] Date of Birth/Sex: Treating RN: 09-17-1945 (77 y.o. Marta Lamas Primary Care Tamar Lipscomb: Marton Redwood Other Clinician: Referring Taevin Mcferran: Treating Joden Bonsall/Extender: Bethena Roys in Treatment: 5 Active Inactive Electronic Signature(s) Signed: 08/14/2022 4:19:16 PM By: Blanche East RN Entered By: Blanche East on 08/13/2022 13:06:15 -------------------------------------------------------------------------------- Pain Assessment Details Patient Name: Date of Service: [name] 08/13/2022 12:45 PM Medical Record Number: [id number] Patient Account Number: [id number] Date of Birth/Sex: Treating RN: [birth year] (77 y.o. Marta Lamas Primary Care Juandaniel Manfredo: Marton Redwood Other Clinician: Referring Kaiyden Simkin: Treating Aquanetta Schwarz/Extender: Bethena Roys in Treatment: 5 Active Problems Location of Pain Severity and Description of Pain Patient Has Paino No Site Locations Rate the pain. Current Pain Level: 0 Pain Management and Medication Current Pain Management: Electronic Signature(s) Signed: 08/14/2022 4:19:16 PM By: Blanche East RN Entered By: Blanche East on 08/13/2022 12:52:49 -------------------------------------------------------------------------------- Patient/Caregiver Education Details Patient Name: Date of Service: Lori [name] 3/20/2024andnbsp12:45 PM Medical Record Number: [id number] Patient Account Number: [id number] Date of Birth/Gender: Treating RN: 1945/12/23 (77 y.o. Marta Lamas Primary Care Physician: Marton Redwood Other Clinician: Referring Physician: Treating Physician/Extender: Bethena Roys in Treatment: 5 Education Assessment Caryn Bee ([id number]) [id number] Page 6 of 7 Education Provided  To: Patient Education Topics Provided Engineer, maintenance) Signed: 08/14/2022 4:19:16 PM By: Blanche East RN Entered By: Blanche East on 08/13/2022 13:06:41 -------------------------------------------------------------------------------- Wound Assessment Details Patient Name: Date of Service: [name] 08/13/2022 12:45 PM Medical Record Number: [id number] Patient Account Number: [id number] Date of Birth/Sex: Treating RN: 01-18-1946 (77 y.o. Iver Nestle, Jamie Primary Care Gwenette Wellons: Marton Redwood Other Clinician: Referring Josefine Fuhr: Treating Elam Ellis/Extender: Bethena Roys in Treatment: 5 Wound Status Wound Number: 2 Primary Dehisced Wound Etiology: Wound Location: Left, Medial T Fifth oe Wound Status: Healed - Epithelialized Wounding Event: Surgical Injury Comorbid Glaucoma, Asthma, Lupus Erythematosus, Rheumatoid Date Acquired: 05/28/2022 History: Arthritis Weeks Of Treatment: 5 Clustered Wound: No Wound Measurements Length: (cm) Width: (cm) Depth: (cm) Area: (cm) Volume: (cm) 0 % Reduction in Area: 100% 0 % Reduction in Volume: 100% 0 Epithelialization: Small (1-33%) 0 Tunneling: No 0 Undermining: No Wound Description Classification: Full Thickness Without Exposed Suppor Exudate Amount: Medium Exudate Type: Serous Exudate Color: amber t Structures Foul Odor After Cleansing: No Slough/Fibrino Yes Wound Bed Granulation Amount:  Large (67-100%) Exposed Structure Granulation Quality: Pink Fascia Exposed: No Necrotic Amount: Small (1-33%) Fat Layer (Subcutaneous Tissue) Exposed: No Necrotic Quality: Eschar Tendon Exposed: No Muscle Exposed: No Joint Exposed: No Bone Exposed: No Periwound Skin Texture Texture Color No Abnormalities Noted: Yes No Abnormalities Noted: No Rubor: Yes Moisture No Abnormalities Noted: No Temperature / Pain Dry / Scaly: Yes Temperature: No Abnormality Maceration: No Tenderness on Palpation:  Yes Treatment Notes Wound #2 (Toe Fifth) Wound Laterality: Left, Medial Cleanser Peri-Wound Care Topical Primary Dressing MAMEDIARRA, RUFENER ([id number]) [phone].pdf Page 7 of 7 Secondary Dressing Secured With Compression Wrap Compression Stockings Add-Ons Electronic Signature(s) Signed: 08/14/2022 4:19:16 PM By: Blanche East RN Entered By: Blanche East on 08/13/2022 13:04:57 -------------------------------------------------------------------------------- Vitals Details Patient Name: Date of Service: [name] 08/13/2022 12:45 PM Medical Record Number: [id number] Patient Account Number: [id number] Date of Birth/Sex: Treating RN: 17-May-1946 (77 y.o. Iver Nestle, Jamie Primary Care Imaan Padgett: Marton Redwood Other Clinician: Referring Evalyn Shultis: Treating Miangel Flom/Extender: Bethena Roys in Treatment: 5 Vital Signs Time Taken: 12:51 Temperature (F): 98.6 Height (in): 66 Pulse (bpm): 77 Weight (lbs): 150 Respiratory Rate (breaths/min): 18 Body Mass Index (BMI): 24.2 Blood Pressure (mmHg): 114/65 Reference Range: 80 - 120 mg / dl Electronic Signature(s) Signed: 08/14/2022 4:19:16 PM By: Blanche East RN Entered By: Blanche East on 08/13/2022 12:52:41

## 2022-09-18 ENCOUNTER — Inpatient Hospital Stay: Payer: PPO | Attending: Hematology & Oncology

## 2022-09-18 ENCOUNTER — Inpatient Hospital Stay: Payer: PPO

## 2022-09-22 ENCOUNTER — Other Ambulatory Visit: Payer: Self-pay | Admitting: Cardiovascular Disease

## 2022-09-22 NOTE — Telephone Encounter (Signed)
Prescription refill request for Eliquis received. Indication: PAF Last office visit: 04/03/22  Bishop Limbo MD Scr: 0.73 on 06/23/22  Epic Age: 77 Weight: 68kg  Based on above findings Eliquis 5mg  twice daily is the appropriate dose.  Refill approved.

## 2022-10-15 ENCOUNTER — Telehealth: Payer: Self-pay | Admitting: *Deleted

## 2022-10-15 NOTE — Telephone Encounter (Signed)
Message received from patient stating that she would like to come in for lab work.  Message sent to scheduling.

## 2022-10-22 ENCOUNTER — Inpatient Hospital Stay: Payer: PPO

## 2022-10-22 ENCOUNTER — Inpatient Hospital Stay: Payer: PPO | Attending: Hematology & Oncology

## 2022-10-22 VITALS — BP 114/68 | HR 77 | Temp 98.5°F | Resp 18

## 2022-10-22 DIAGNOSIS — M810 Age-related osteoporosis without current pathological fracture: Secondary | ICD-10-CM

## 2022-10-22 DIAGNOSIS — Z452 Encounter for adjustment and management of vascular access device: Secondary | ICD-10-CM

## 2022-10-22 LAB — CBC WITH DIFFERENTIAL (CANCER CENTER ONLY)
Abs Immature Granulocytes: 0.01 10*3/uL (ref 0.00–0.07)
Basophils Absolute: 0 10*3/uL (ref 0.0–0.1)
Basophils Relative: 1 %
Eosinophils Absolute: 0 10*3/uL (ref 0.0–0.5)
Eosinophils Relative: 1 %
HCT: 41 % (ref 36.0–46.0)
Hemoglobin: 13.9 g/dL (ref 12.0–15.0)
Immature Granulocytes: 0 %
Lymphocytes Relative: 35 %
Lymphs Abs: 2.2 10*3/uL (ref 0.7–4.0)
MCH: 32.7 pg (ref 26.0–34.0)
MCHC: 33.9 g/dL (ref 30.0–36.0)
MCV: 96.5 fL (ref 80.0–100.0)
Monocytes Absolute: 0.5 10*3/uL (ref 0.1–1.0)
Monocytes Relative: 8 %
Neutro Abs: 3.4 10*3/uL (ref 1.7–7.7)
Neutrophils Relative %: 55 %
Platelet Count: 182 10*3/uL (ref 150–400)
RBC: 4.25 MIL/uL (ref 3.87–5.11)
RDW: 13.2 % (ref 11.5–15.5)
WBC Count: 6.1 10*3/uL (ref 4.0–10.5)
nRBC: 0 % (ref 0.0–0.2)

## 2022-10-22 LAB — CMP (CANCER CENTER ONLY)
ALT: 20 U/L (ref 0–44)
AST: 26 U/L (ref 15–41)
Albumin: 4.4 g/dL (ref 3.5–5.0)
Alkaline Phosphatase: 68 U/L (ref 38–126)
Anion gap: 5 (ref 5–15)
BUN: 22 mg/dL (ref 8–23)
CO2: 28 mmol/L (ref 22–32)
Calcium: 9.8 mg/dL (ref 8.9–10.3)
Chloride: 104 mmol/L (ref 98–111)
Creatinine: 0.75 mg/dL (ref 0.44–1.00)
GFR, Estimated: >60 mL/min (ref >60)
Glucose, Bld: 188 mg/dL — ABNORMAL HIGH (ref 70–99)
Potassium: 3.6 mmol/L (ref 3.5–5.1)
Sodium: 137 mmol/L (ref 135–145)
Total Bilirubin: 0.7 mg/dL (ref 0.3–1.2)
Total Protein: 6.2 g/dL — ABNORMAL LOW (ref 6.5–8.1)

## 2022-10-22 LAB — FERRITIN: Ferritin: 48 ng/mL (ref 11–307)

## 2022-10-22 LAB — IRON AND IRON BINDING CAPACITY (CC-WL,HP ONLY)
Iron: 238 ug/dL — ABNORMAL HIGH (ref 28–170)
Saturation Ratios: 91 % — ABNORMAL HIGH (ref 10.4–31.8)
TIBC: 260 ug/dL (ref 250–450)
UIBC: 22 ug/dL — ABNORMAL LOW (ref 148–442)

## 2022-10-22 MED ORDER — SODIUM CHLORIDE 0.9% FLUSH
10.0000 mL | INTRAVENOUS | Status: DC | PRN
  Administered 2022-10-22: 10 mL

## 2022-10-22 MED ORDER — HEPARIN SOD (PORK) LOCK FLUSH 100 UNIT/ML IV SOLN
500.0000 [IU] | Freq: Once | INTRAVENOUS | Status: AC | PRN
  Administered 2022-10-22: 500 [IU]

## 2022-10-27 ENCOUNTER — Telehealth: Payer: Self-pay | Admitting: *Deleted

## 2022-10-27 NOTE — Telephone Encounter (Signed)
Per scheduling message Sarah - called patient and lvm for a call back to schedule a phlebotomy. 

## 2022-10-30 ENCOUNTER — Inpatient Hospital Stay: Payer: PPO | Attending: Hematology & Oncology

## 2022-10-30 MED ORDER — SODIUM CHLORIDE 0.9% FLUSH
10.0000 mL | INTRAVENOUS | Status: DC | PRN
  Administered 2022-10-30: 10 mL via INTRAVENOUS

## 2022-10-30 MED ORDER — HEPARIN SOD (PORK) LOCK FLUSH 100 UNIT/ML IV SOLN
500.0000 [IU] | Freq: Once | INTRAVENOUS | Status: AC
  Administered 2022-10-30: 500 [IU] via INTRAVENOUS

## 2022-10-30 NOTE — Patient Instructions (Signed)

## 2022-10-30 NOTE — Progress Notes (Signed)
Lori [name] presents today for phlebotomy per MD orders. Phlebotomy procedure started at 1210 and ended at 1235. 500 cc removed via port- a-cath. Patient tolerated procedure well. Patient refused to wait 30 minutes post phlebotomy. Released stable and ASX.

## 2022-10-31 DIAGNOSIS — M25561 Pain in right knee: Secondary | ICD-10-CM | POA: Diagnosis not present

## 2022-10-31 DIAGNOSIS — Z96642 Presence of left artificial hip joint: Secondary | ICD-10-CM | POA: Diagnosis not present

## 2022-10-31 DIAGNOSIS — M4186 Other forms of scoliosis, lumbar region: Secondary | ICD-10-CM | POA: Diagnosis not present

## 2022-11-03 DIAGNOSIS — M13842 Other specified arthritis, left hand: Secondary | ICD-10-CM | POA: Diagnosis not present

## 2022-11-03 DIAGNOSIS — M13841 Other specified arthritis, right hand: Secondary | ICD-10-CM | POA: Diagnosis not present

## 2022-11-19 DIAGNOSIS — M25561 Pain in right knee: Secondary | ICD-10-CM | POA: Diagnosis not present

## 2022-11-20 DIAGNOSIS — M5451 Vertebrogenic low back pain: Secondary | ICD-10-CM | POA: Diagnosis not present

## 2022-11-25 DIAGNOSIS — M5451 Vertebrogenic low back pain: Secondary | ICD-10-CM | POA: Diagnosis not present

## 2022-11-26 DIAGNOSIS — M25561 Pain in right knee: Secondary | ICD-10-CM | POA: Diagnosis not present

## 2022-12-09 DIAGNOSIS — M545 Low back pain, unspecified: Secondary | ICD-10-CM | POA: Diagnosis not present

## 2022-12-09 DIAGNOSIS — M5416 Radiculopathy, lumbar region: Secondary | ICD-10-CM | POA: Diagnosis not present

## 2022-12-18 ENCOUNTER — Inpatient Hospital Stay: Payer: PPO

## 2022-12-18 ENCOUNTER — Inpatient Hospital Stay: Payer: PPO | Attending: Hematology & Oncology

## 2022-12-18 ENCOUNTER — Inpatient Hospital Stay (HOSPITAL_BASED_OUTPATIENT_CLINIC_OR_DEPARTMENT_OTHER): Payer: PPO | Admitting: Hematology & Oncology

## 2022-12-18 ENCOUNTER — Encounter: Payer: Self-pay | Admitting: Hematology & Oncology

## 2022-12-18 ENCOUNTER — Other Ambulatory Visit: Payer: Self-pay

## 2022-12-18 DIAGNOSIS — I480 Paroxysmal atrial fibrillation: Secondary | ICD-10-CM | POA: Diagnosis not present

## 2022-12-18 DIAGNOSIS — Z7901 Long term (current) use of anticoagulants: Secondary | ICD-10-CM | POA: Insufficient documentation

## 2022-12-18 LAB — CBC WITH DIFFERENTIAL (CANCER CENTER ONLY)
Abs Immature Granulocytes: 0.01 10*3/uL (ref 0.00–0.07)
Basophils Absolute: 0 10*3/uL (ref 0.0–0.1)
Basophils Relative: 1 %
Eosinophils Absolute: 0.1 10*3/uL (ref 0.0–0.5)
Eosinophils Relative: 1 %
HCT: 44.6 % (ref 36.0–46.0)
Hemoglobin: 15 g/dL (ref 12.0–15.0)
Immature Granulocytes: 0 %
Lymphocytes Relative: 31 %
Lymphs Abs: 2.4 10*3/uL (ref 0.7–4.0)
MCH: 32.6 pg (ref 26.0–34.0)
MCHC: 33.6 g/dL (ref 30.0–36.0)
MCV: 97 fL (ref 80.0–100.0)
Monocytes Absolute: 0.7 10*3/uL (ref 0.1–1.0)
Monocytes Relative: 9 %
Neutro Abs: 4.5 10*3/uL (ref 1.7–7.7)
Neutrophils Relative %: 58 %
Platelet Count: 194 10*3/uL (ref 150–400)
RBC: 4.6 MIL/uL (ref 3.87–5.11)
RDW: 12 % (ref 11.5–15.5)
WBC Count: 7.7 10*3/uL (ref 4.0–10.5)
nRBC: 0 % (ref 0.0–0.2)

## 2022-12-18 LAB — CMP (CANCER CENTER ONLY)
ALT: 19 U/L (ref 0–44)
AST: 28 U/L (ref 15–41)
Albumin: 4.5 g/dL (ref 3.5–5.0)
Alkaline Phosphatase: 66 U/L (ref 38–126)
Anion gap: 8 (ref 5–15)
BUN: 19 mg/dL (ref 8–23)
CO2: 26 mmol/L (ref 22–32)
Calcium: 10.2 mg/dL (ref 8.9–10.3)
Chloride: 103 mmol/L (ref 98–111)
Creatinine: 0.68 mg/dL (ref 0.44–1.00)
GFR, Estimated: >60 mL/min (ref >60)
Glucose, Bld: 130 mg/dL — ABNORMAL HIGH (ref 70–99)
Potassium: 4 mmol/L (ref 3.5–5.1)
Sodium: 137 mmol/L (ref 135–145)
Total Bilirubin: 0.6 mg/dL (ref 0.3–1.2)
Total Protein: 6.7 g/dL (ref 6.5–8.1)

## 2022-12-18 LAB — FERRITIN: Ferritin: 60 ng/mL (ref 11–307)

## 2022-12-18 NOTE — Progress Notes (Signed)
Hematology and Oncology Follow Up Visit  Lori [name] [id number] 1945/07/10 77 y.o. 12/18/2022   Principle Diagnosis:  Hemochromatosis - heterozygous for the C282Y mutation Paroxysmal A fib   Current Therapy:        Phlebotomy as indicated to keep ferritin < 35  (changed per patient request 06/24/17) Eliquis 5 mg po bid -- patient stopped 3 days ago due to bleeding   Interim History:  Lori [name] is here today for follow-up. I it has been quite a while since we last saw her.  Since then, she has had some issues.  I think the biggest issue was that she was put on Eliquis and metoprolol because of what appeared to be atrial fibrillation that is paroxysmal.  She says that she really is having a hard time taking these.  She thinks she is allergic to them.  She says she is bleeding.  She says she is had red stool.  This been going on for quite a while.  She says she stopped the Eliquis 3 days ago.  I am unsure if she has notified Dr. Tresa Endo of Cardiology about this.  When we checked her labs today, she was certainly was not anemic.  Her hemoglobin was 15 and hematocrit was 44.6.  Hopefully, she will be able to get all this sorted out.  Again, she cannot take Eliquis, then I probably would recommend the Watchman procedure for her.  I talked to her about this.  This is something that is done for patients with atrial fibrillation cannot be anticoagulated.  She understands this.  Again, I think she sees Dr. Tresa Endo of Cardiology soon.  She has had no fever.  She has had no problems with COVID.  She has had some abdominal  discomfort on occasion.  There is been no nausea or vomiting.    Her iron studies that were done back in May showed a ferritin of 48 with an iron saturation of 91%.  She has had no dysuria or urinary frequency.  She has had no cough.  She does feel the palpitations of the atrial fibrillation.  Currently, I would have to say that her performance status is probably ECOG 1.     Medications:  Allergies as of 12/18/2022       Reactions   Albumin (human)    Pt stated, "I am not allergic to this and don't know what this means"; 04/27/17   Amoxicillin Nausea And Vomiting   Codeine Nausea Only   Erythromycin Nausea Only, Other (See Comments)   Iodinated Contrast Media Other (See Comments)   Keflex [cephalexin] Itching   Meperidine    Pt stated, "I don't know what this is"; 04/27/17   Metoclopramide Hcl    Other Other (See Comments)   Beef Chicken Dairy Eggs Pt stated, "My body can't process protein; it makes me sick"   Percocet [oxycodone-acetaminophen]    Made pt sick    Shellfish Allergy Other (See Comments)   Pt stated, "I am not allergic to this anymore"; 04/27/17 Pt stated, "I am not allergic to this anymore"; 04/27/17   Shellfish-derived Products    Oxycodone Nausea And Vomiting   Pt stated, "I do not know if I am allergic to this"; 04/27/17        Medication List        Accurate as of December 18, 2022  2:21 PM. If you have any questions, ask your nurse or doctor.  amiloride-hydrochlorothiazide 5-50 MG tablet Commonly known as: MODURETIC Take 0.5 tablets by mouth daily. As Needed   cyanocobalamin 1000 MCG tablet Commonly known as: VITAMIN B12 Take 1,000 mcg by mouth daily.   Eliquis 5 MG Tabs tablet Generic drug: apixaban Take 1 tablet (5 mg total) by mouth 2 (two) times daily.   fish oil-omega-3 fatty acids 1000 MG capsule Take 1 g by mouth daily.   ipratropium 0.06 % nasal spray Commonly known as: ATROVENT Place 2 sprays into both nostrils daily as needed.   MELATONIN PO Take 2 mg by mouth at bedtime.   metoprolol succinate 25 MG 24 hr tablet Commonly known as: Toprol XL Take 25 mg twice a day only if needed for palpitations   MULTIVITAMIN PO Take by mouth.   nystatin 500000 units Tabs tablet Commonly known as: MYCOSTATIN Take 1 tablet by mouth at bedtime.   potassium chloride SA 20 MEQ tablet Commonly known  as: KLOR-CON M Take 20 mEq by mouth daily.   PRASTERONE (DHEA) PO Take 28 mg by mouth daily.   PROBIOTIC DAILY PO Take 1 capsule by mouth daily.   progesterone 100 MG capsule Commonly known as: PROMETRIUM Take 100 mg by mouth daily.   thyroid 60 MG tablet Commonly known as: ARMOUR Take 60 mg by mouth daily before breakfast.   thyroid 15 MG tablet Commonly known as: ARMOUR Take 15 mg by mouth 3 (three) times a week.   vitamin C 1000 MG tablet Take 3,000 mg by mouth daily.   VITAMIN D PO Take 1 tablet by mouth daily.   zinc gluconate 50 MG tablet Take 50 mg by mouth daily.        Allergies:  Allergies  Allergen Reactions   Albumin (Human)     Pt stated, "I am not allergic to this and don't know what this means"; 04/27/17   Amoxicillin Nausea And Vomiting   Codeine Nausea Only   Erythromycin Nausea Only and Other (See Comments)   Iodinated Contrast Media Other (See Comments)   Keflex [Cephalexin] Itching   Meperidine     Pt stated, "I don't know what this is"; 04/27/17   Metoclopramide Hcl    Other Other (See Comments)    Beef Chicken Dairy Eggs  Pt stated, "My body can't process protein; it makes me sick"   Percocet [Oxycodone-Acetaminophen]     Made pt sick    Shellfish Allergy Other (See Comments)    Pt stated, "I am not allergic to this anymore"; 04/27/17 Pt stated, "I am not allergic to this anymore"; 04/27/17   Shellfish-Derived Products    Oxycodone Nausea And Vomiting    Pt stated, "I do not know if I am allergic to this"; 04/27/17    Past Medical History, Surgical history, Social history, and Family History were reviewed and updated.  Review of Systems: Review of Systems  Constitutional: Negative.   HENT: Negative.    Eyes: Negative.   Respiratory: Negative.    Cardiovascular:  Positive for palpitations.  Gastrointestinal:  Positive for blood in stool.  Genitourinary: Negative.   Musculoskeletal: Negative.   Skin: Negative.   Neurological:  Negative.   Endo/Heme/Allergies:  Bruises/bleeds easily.  Psychiatric/Behavioral: Negative.       Physical Exam:  height is 5\' 7"  (1.702 m) and weight is 153 lb (69.4 kg). Her oral temperature is 97.3 F (36.3 C) (abnormal). Her blood pressure is 116/47 (abnormal) and her pulse is 71. Her respiration is 19 and oxygen saturation is 100%.  Wt Readings from Last 3 Encounters:  12/18/22 153 lb (69.4 kg)  07/02/22 160 lb 6.4 oz (72.8 kg)  04/03/22 150 lb (68 kg)    Physical Exam Vitals reviewed.  HENT:     Head: Normocephalic and atraumatic.  Eyes:     Pupils: Pupils are equal, round, and reactive to light.  Cardiovascular:     Rate and Rhythm: Normal rate and regular rhythm.     Heart sounds: Normal heart sounds.     Comments: Cardiac exam is regular rate and rhythm.  She does have occasional erratic beats.  These are very limited.  She has no murmurs. Pulmonary:     Effort: Pulmonary effort is normal.     Breath sounds: Normal breath sounds.     Comments: Lungs sound clear to percussion and auscultation bilaterally.  She has good air movement bilaterally. Abdominal:     General: Bowel sounds are normal.     Palpations: Abdomen is soft.     Comments: Abdomen is soft.  She has good bowel sounds.  There is no guarding or rebound tenderness.  There is no obvious fluid wave.  There is no palpable liver or spleen tip.  Musculoskeletal:        General: No tenderness or deformity. Normal range of motion.     Cervical back: Normal range of motion.  Lymphadenopathy:     Cervical: No cervical adenopathy.  Skin:    General: Skin is warm and dry.     Findings: No erythema or rash.  Neurological:     Mental Status: She is alert and oriented to person, place, and time.  Psychiatric:        Behavior: Behavior normal.        Thought Content: Thought content normal.        Judgment: Judgment normal.      Lab Results  Component Value Date   WBC 7.7 12/18/2022   HGB 15.0 12/18/2022    HCT 44.6 12/18/2022   MCV 97.0 12/18/2022   PLT 194 12/18/2022   Lab Results  Component Value Date   FERRITIN 48 10/22/2022   IRON 238 (H) 10/22/2022   TIBC 260 10/22/2022   UIBC 22 (L) 10/22/2022   IRONPCTSAT 91 (H) 10/22/2022   Lab Results  Component Value Date   RETICCTPCT 2.1 12/17/2021   RBC 4.60 12/18/2022   No results found for: "KPAFRELGTCHN", "LAMBDASER", "KAPLAMBRATIO" No results found for: "IGGSERUM", "IGA", "IGMSERUM" No results found for: "TOTALPROTELP", "ALBUMINELP", "A1GS", "A2GS", "BETS", "BETA2SER", "GAMS", "MSPIKE", "SPEI"   Chemistry      Component Value Date/Time   NA 137 12/18/2022 1345   NA 141 09/04/2021 0844   NA 143 03/11/2017 1404   NA 139 07/02/2016 1139   K 4.0 12/18/2022 1345   K 3.9 03/11/2017 1404   K 4.1 07/02/2016 1139   CL 103 12/18/2022 1345   CL 105 03/11/2017 1404   CO2 26 12/18/2022 1345   CO2 31 03/11/2017 1404   CO2 26 07/02/2016 1139   BUN 19 12/18/2022 1345   BUN 15 09/04/2021 0844   BUN 15 03/11/2017 1404   BUN 20.3 07/02/2016 1139   CREATININE 0.68 12/18/2022 1345   CREATININE 0.73 06/23/2022 1141   CREATININE 0.8 07/02/2016 1139      Component Value Date/Time   CALCIUM 10.2 12/18/2022 1345   CALCIUM 9.4 03/11/2017 1404   CALCIUM 10.0 07/02/2016 1139   ALKPHOS 66 12/18/2022 1345   ALKPHOS 69 03/11/2017 1404  ALKPHOS 87 07/02/2016 1139   AST 28 12/18/2022 1345   AST 27 07/02/2016 1139   ALT 19 12/18/2022 1345   ALT 32 03/11/2017 1404   ALT 34 07/02/2016 1139   BILITOT 0.6 12/18/2022 1345   BILITOT 0.55 07/02/2016 1139       Impression and Plan: Lori [name] is a pleasant 77 yo caucasian female with hemochromatosis, heterozygous for the C282Y mutation.   I think the main problem with her right now is this potential bleeding from the Eliquis and her having the paroxysmal atrial fibrillation.  Again, cardiology I am sure will help manage this.  Again I told her that if she has paroxysmal atrial fibrillation,  she is at significant risk for CVA.  As such, I am sure that something will have to be entertained as to how to prevent her from having a stroke.  We will see what her iron studies look like.  I know that she is dealing with a lot right now.  I know that she is very tough.  She has a strong constitution.  She is very proactive with respect to her own health.  I will like to see her back probably in about 6 weeks or so just so we can maintain closer follow-up with her.  Josph Macho, MD 7/25/20242:21 PM

## 2022-12-18 NOTE — Patient Instructions (Signed)

## 2022-12-19 DIAGNOSIS — M94262 Chondromalacia, left knee: Secondary | ICD-10-CM | POA: Diagnosis not present

## 2022-12-19 DIAGNOSIS — M25561 Pain in right knee: Secondary | ICD-10-CM | POA: Diagnosis not present

## 2022-12-19 DIAGNOSIS — M5451 Vertebrogenic low back pain: Secondary | ICD-10-CM | POA: Diagnosis not present

## 2022-12-19 DIAGNOSIS — Z96642 Presence of left artificial hip joint: Secondary | ICD-10-CM | POA: Diagnosis not present

## 2022-12-19 LAB — IRON AND IRON BINDING CAPACITY (CC-WL,HP ONLY): UIBC: 42 ug/dL — ABNORMAL LOW (ref 148–442)

## 2022-12-22 DIAGNOSIS — R195 Other fecal abnormalities: Secondary | ICD-10-CM | POA: Diagnosis not present

## 2022-12-22 DIAGNOSIS — R197 Diarrhea, unspecified: Secondary | ICD-10-CM | POA: Diagnosis not present

## 2022-12-22 DIAGNOSIS — K625 Hemorrhage of anus and rectum: Secondary | ICD-10-CM | POA: Diagnosis not present

## 2022-12-22 DIAGNOSIS — Z86010 Personal history of colonic polyps: Secondary | ICD-10-CM | POA: Diagnosis not present

## 2022-12-30 ENCOUNTER — Inpatient Hospital Stay: Payer: PPO | Attending: Hematology & Oncology

## 2022-12-30 VITALS — BP 97/58 | HR 70 | Temp 98.6°F | Resp 18

## 2022-12-30 DIAGNOSIS — Z95828 Presence of other vascular implants and grafts: Secondary | ICD-10-CM

## 2022-12-30 MED ORDER — HEPARIN SOD (PORK) LOCK FLUSH 100 UNIT/ML IV SOLN
500.0000 [IU] | Freq: Once | INTRAVENOUS | Status: AC
  Administered 2022-12-30: 500 [IU] via INTRAVENOUS

## 2022-12-30 MED ORDER — SODIUM CHLORIDE 0.9% FLUSH
10.0000 mL | Freq: Once | INTRAVENOUS | Status: AC
  Administered 2022-12-30: 10 mL via INTRAVENOUS

## 2022-12-30 NOTE — Progress Notes (Signed)
Lori [name] presents today for phlebotomy per MD orders. Phlebotomy procedure started at 1300 and ended at 1355. 500 grams removed. Patient observed for 30 minutes after procedure without any incident. Patient tolerated procedure well. IV needle removed intact.

## 2022-12-30 NOTE — Patient Instructions (Signed)

## 2022-12-31 DIAGNOSIS — M5416 Radiculopathy, lumbar region: Secondary | ICD-10-CM | POA: Diagnosis not present

## 2022-12-31 DIAGNOSIS — M48062 Spinal stenosis, lumbar region with neurogenic claudication: Secondary | ICD-10-CM | POA: Diagnosis not present

## 2022-12-31 DIAGNOSIS — M5136 Other intervertebral disc degeneration, lumbar region: Secondary | ICD-10-CM | POA: Diagnosis not present

## 2023-01-08 DIAGNOSIS — R197 Diarrhea, unspecified: Secondary | ICD-10-CM | POA: Diagnosis not present

## 2023-01-08 DIAGNOSIS — K5730 Diverticulosis of large intestine without perforation or abscess without bleeding: Secondary | ICD-10-CM | POA: Diagnosis not present

## 2023-01-08 DIAGNOSIS — R1310 Dysphagia, unspecified: Secondary | ICD-10-CM | POA: Diagnosis not present

## 2023-01-08 DIAGNOSIS — K6389 Other specified diseases of intestine: Secondary | ICD-10-CM | POA: Diagnosis not present

## 2023-01-08 DIAGNOSIS — K635 Polyp of colon: Secondary | ICD-10-CM | POA: Diagnosis not present

## 2023-01-08 DIAGNOSIS — D122 Benign neoplasm of ascending colon: Secondary | ICD-10-CM | POA: Diagnosis not present

## 2023-01-08 LAB — HM COLONOSCOPY

## 2023-01-12 DIAGNOSIS — H33192 Other retinoschisis and retinal cysts, left eye: Secondary | ICD-10-CM | POA: Diagnosis not present

## 2023-01-12 DIAGNOSIS — H2513 Age-related nuclear cataract, bilateral: Secondary | ICD-10-CM | POA: Diagnosis not present

## 2023-01-12 DIAGNOSIS — H40013 Open angle with borderline findings, low risk, bilateral: Secondary | ICD-10-CM | POA: Diagnosis not present

## 2023-01-12 DIAGNOSIS — H43812 Vitreous degeneration, left eye: Secondary | ICD-10-CM | POA: Diagnosis not present

## 2023-01-14 ENCOUNTER — Telehealth (HOSPITAL_COMMUNITY): Payer: Self-pay

## 2023-01-14 ENCOUNTER — Telehealth (HOSPITAL_COMMUNITY): Payer: Self-pay | Admitting: Vascular Surgery

## 2023-01-14 NOTE — Telephone Encounter (Signed)
Lvm giving pt new pt appt w/ [facility] 10/15 @ 1140

## 2023-01-14 NOTE — Telephone Encounter (Signed)
Per Dr. Shirlee Latch patient needs a new patient appointment. He sees her husband already. Please schedule

## 2023-01-15 DIAGNOSIS — M5416 Radiculopathy, lumbar region: Secondary | ICD-10-CM | POA: Diagnosis not present

## 2023-01-29 ENCOUNTER — Telehealth: Payer: Self-pay | Admitting: *Deleted

## 2023-01-29 NOTE — Telephone Encounter (Signed)
Patient left message on triage voicemail requesting Rx for Progesterone 75 mg caps to [facility]. Currently on premarin and DHEA. Requesting return call.    Last AEX 03/21/21 -ML

## 2023-01-29 NOTE — Telephone Encounter (Signed)
Spoke with patient. Patient is requesting RX for Progesterone, currently on DHEA and Pregnenolone. Progesterone previously prescribed by another provider.   Advised patient she would need OV for further discussion, Dr. Seymour Bars no longer with [facility]. Patient declines to schedule Breast and Pelvic exam at this time, would like to schedule OV to further discuss. Reviewed provider options, scheduled for OV with Jami on 9/12 at 1100.   Routing to provider for final review. Patient is agreeable to disposition. Will close encounter.

## 2023-02-05 ENCOUNTER — Ambulatory Visit: Payer: PPO | Admitting: Radiology

## 2023-02-05 ENCOUNTER — Telehealth: Payer: Self-pay | Admitting: *Deleted

## 2023-02-05 DIAGNOSIS — M25561 Pain in right knee: Secondary | ICD-10-CM | POA: Diagnosis not present

## 2023-02-05 DIAGNOSIS — M94262 Chondromalacia, left knee: Secondary | ICD-10-CM | POA: Diagnosis not present

## 2023-02-05 NOTE — Telephone Encounter (Signed)
Call to patient per request of JC, NP to request she bring medical records to appointment. Patient has not been seen in our office since 2022 by Dr. Seymour Bars. Patient advise per Asher Muir, she will not be able to prescribe progesterone as desired because mammogram and annual exam are overdue. Patient states, "mammograms are harmful and she will not be getting one every year." States she saw [facility] doctor who prescribed progesterone for her, but it was becoming expensive. RN asked if she could bring records to appointment as nothing is notated in our electronic medical record. Patient states, "of course it's not he's not in your system and it's not legal for doctor's to share records." Patient states, "just forget it, I'm not coming" and disconnected call. RN cancelled appointment.  Routing to provider for review. Encounter closed.

## 2023-02-09 DIAGNOSIS — K219 Gastro-esophageal reflux disease without esophagitis: Secondary | ICD-10-CM | POA: Diagnosis not present

## 2023-02-09 DIAGNOSIS — R1310 Dysphagia, unspecified: Secondary | ICD-10-CM | POA: Diagnosis not present

## 2023-02-09 DIAGNOSIS — K625 Hemorrhage of anus and rectum: Secondary | ICD-10-CM | POA: Diagnosis not present

## 2023-02-16 ENCOUNTER — Ambulatory Visit (HOSPITAL_COMMUNITY)
Admission: RE | Admit: 2023-02-16 | Discharge: 2023-02-16 | Disposition: A | Discharge status: Home / Self Care | Payer: PPO | Source: Ambulatory Visit | Attending: Cardiology | Admitting: Cardiology

## 2023-02-16 ENCOUNTER — Encounter (HOSPITAL_COMMUNITY): Payer: Self-pay | Admitting: Cardiology

## 2023-02-16 VITALS — BP 100/60 | HR 83 | Wt 153.4 lb

## 2023-02-16 DIAGNOSIS — L03119 Cellulitis of unspecified part of limb: Secondary | ICD-10-CM

## 2023-02-16 DIAGNOSIS — M25861 Other specified joint disorders, right knee: Secondary | ICD-10-CM | POA: Insufficient documentation

## 2023-02-16 DIAGNOSIS — L02619 Cutaneous abscess of unspecified foot: Secondary | ICD-10-CM

## 2023-02-16 DIAGNOSIS — L948 Other specified localized connective tissue disorders: Secondary | ICD-10-CM | POA: Insufficient documentation

## 2023-02-16 DIAGNOSIS — I480 Paroxysmal atrial fibrillation: Secondary | ICD-10-CM | POA: Diagnosis not present

## 2023-02-16 DIAGNOSIS — R002 Palpitations: Secondary | ICD-10-CM | POA: Diagnosis not present

## 2023-02-16 NOTE — Patient Instructions (Signed)
There has been no changes to medications.  You have been referred to dr. Geannie Risen office.  They will call you to arrange your appointment .  Your provider has ordered a Venus ultra sound of your right leg.  This will be done at the [facility] office and they will call you to arrange the test.   Your physician recommends that you schedule a follow-up appointment in: 4 months ( January 2025) ** PLEASE CALL THE OFFICE IN NOVEMBER TO ARRANGE YOUR FOLLOW UP APPOINTMENT. **  If you have any questions or concerns before your next appointment please send [place] a message through [place] or call our office at [phone].    TO LEAVE A MESSAGE FOR THE NURSE SELECT OPTION 2, PLEASE LEAVE A MESSAGE INCLUDING: YOUR NAME DATE OF BIRTH CALL BACK NUMBER REASON FOR CALL**this is important as we prioritize the call backs  YOU WILL RECEIVE A CALL BACK THE SAME DAY AS LONG AS YOU CALL BEFORE 4:00 PM  At the [facility], you and your health needs are our priority. As part of our continuing mission to provide you with exceptional heart care, we have created designated Provider Care Teams. These Care Teams include your primary Cardiologist (physician) and Advanced Practice Providers (APPs- Physician Assistants and Nurse Practitioners) who all work together to provide you with the care you need, when you need it.   You may see any of the following providers on your designated Care Team at your next follow up: Dr Arvilla Meres Dr Marca Ancona Dr. Marcos Eke, NP Robbie Lis, [place] [facility] [place], [place] Brynda Peon, NP Karle Plumber, PharmD   Please be sure to bring in all your medications bottles to every appointment.    Thank you for choosing [facility]

## 2023-02-16 NOTE — Progress Notes (Signed)
PCP: [name]., MD Cardiology: Dr. Shirlee Latch  77 y.o. with history of hereditary hemochromatosis, paroxysmal atrial fibrillation, and mixed connective tissue disorder was referred by Dr. Clelia Croft for evaluation of atrial fibrillation.  I have seen her husband for his CHF for years. Patient had a negative Cardiolite in 2013.  She had an echo in 4/23 with EF 60-65%, normal RV, mild-moderate TR. She sees Dr. Myna Hidalgo for hemochromatosis and has had periodic phlebotomies. She was diagnosed with atrial fibrillation in 3/23 by a Zio monitor. She had noted palpitations x years.  The monitor showed a 20% burden at the time.  She was started on apixaban and Toprol XL.  She felt like the Toprol XL made her fatigued, and she noted blood in her stool while on Eliquis.  She stopped the Eliquis and had a GI evaluation in 9/24 with EGD and colonoscopy that were unremarkable. She has stayed off Eliquis since that time.   Currently, she feels palpitations several times a week.  When this occurs, she will get short of breath and feel "bad."  She will take Toprol XL prn when these episodes occur, it seems to help the symptoms though it fatigues her.  No chest pain.  No orthopnea/PND.  No lightheadedness or syncope.   ECG (personally reviewed): NSR, 1st degree AVB 216 msec  Labs (4/23): LDL 121 Labs (7/24): K 4, creatinine 0.68  PMH: 1. Atrial fibrillation: Paroxysmal.  Diagnosed by Zio monitor in 4/23.  Symptomatic when it occurs.  - Echo (3/23): EF 60-65%, normal RV, mild-moderate TR.  2. GI bleeding: Colonoscopy and EGD in 9/24 were unremarkable.  3. Hereditary hemochromatosis: C282Y heterozygote.  - Has had periodic phlebotomies 4. Mixed connective tissue disease: She does not see a rheumatologist.  5. Hypothyroidism 6. Irritable bowel syndrome 7. H/o UTIs 8. Cardiolite (2013): No ischemia/infarction.   Family History  Problem Relation Age of Onset   Seizures Mother    Transient ischemic attack Mother     Glaucoma Mother    Cancer Father 53       COLON   Diabetes Father    Heart disease Father    Breast cancer Maternal Aunt    Heart disease Paternal Uncle    Breast cancer Maternal Aunt    Glaucoma Maternal Grandmother    Social History   Socioeconomic History   Marital status: Married    Spouse name: Not on file   Number of children: Not on file   Years of education: Not on file   Highest education level: Not on file  Occupational History   Not on file  Tobacco Use   Smoking status: Never   Smokeless tobacco: Never  Vaping Use   Vaping status: Never Used  Substance and Sexual Activity   Alcohol use: Not Currently    Comment: wine occ   Drug use: No   Sexual activity: Not Currently    Birth control/protection: Post-menopausal  Other Topics Concern   Not on file  Social History Narrative   Not on file   Social Determinants of Health   Financial Resource Strain: Not on file  Food Insecurity: Not on file  Transportation Needs: Not on file  Physical Activity: Not on file  Stress: Not on file  Social Connections: Unknown (10/05/2021)   Received from [facility], [facility]   Social Network    Social Network: Not on file  Intimate Partner Violence: Unknown (08/27/2021)   [facility], [facility]  HITS    Physically Hurt: Not on file    Insult or Talk Down To: Not on file    Threaten Physical Harm: Not on file    Scream or Curse: Not on file   ROS: All systems reviewed and negative except as per HPI.   Current Outpatient Medications  Medication Sig Dispense Refill   amiloride-hydrochlorothiazide (MODURETIC) 5-50 MG tablet Take 0.5 tablets by mouth daily. As Needed     Ascorbic Acid (VITAMIN C) 1000 MG tablet Take 3,000 mg by mouth daily.     Cholecalciferol (VITAMIN D PO) Take 1 tablet by mouth daily.     fish oil-omega-3 fatty acids 1000 MG capsule Take 1 g by mouth daily.     melatonin 5 MG TABS Take 5 mg by mouth at bedtime.      Multiple Vitamin (MULTIVITAMIN PO) Take by mouth.     potassium chloride SA (K-DUR,KLOR-CON) 20 MEQ tablet Take 20 mEq by mouth daily.      Probiotic Product (PROBIOTIC DAILY PO) Take 1 capsule by mouth daily.     thyroid (ARMOUR) 15 MG tablet Take 15 mg by mouth. Only Monday-Friday     thyroid (ARMOUR) 60 MG tablet Take 60 mg by mouth daily before breakfast.     vitamin B-12 (CYANOCOBALAMIN) 1000 MCG tablet Take 1,000 mcg by mouth daily.     zinc gluconate 50 MG tablet Take 50 mg by mouth daily.     PRASTERONE, DHEA, PO Take 28 mg by mouth daily.  (Patient not taking: Reported on 07/02/2022)     Current Facility-Administered Medications  Medication Dose Route Frequency Provider Last Rate Last Admin   triamcinolone acetonide (KENALOG) 10 MG/ML injection 10 mg  10 mg Other Once Regal, Norman S, DPM       BP 100/60   Pulse 83   Wt 69.6 kg (153 lb 6.4 oz)   SpO2 100%   BMI 24.03 kg/m  General: NAD Neck: No JVD, no thyromegaly or thyroid nodule.  Lungs: Clear to auscultation bilaterally with normal respiratory effort. CV: Nondisplaced PMI.  Heart regular S1/S2, no S3/S4, no murmur.  No peripheral edema.  No carotid bruit.  Normal pedal pulses.  Abdomen: Soft, nontender, no hepatosplenomegaly, no distention.  Skin: Intact without lesions or rashes.  Neurologic: Alert and oriented x 3.  Psych: Normal affect. Extremities: No clubbing or cyanosis. Cystic structure noted behind right knee. Venous varicosities.  HEENT: Normal.   Assessment/Plan: 1. Atrial fibrillation: Paroxysmal.  She has short symptomatic atrial fibrillation runs, these seem to occur a couple of times a week.  She is in NSR today. She does not tolerate Toprol XL well, uses prn symptoms of AF.  She noted GI bleeding on apixaban and does not want to take it long-term.  CHADSVASC score is 3 for age and gender.   - I think that Watchman would be a good option for her.  She would be willing to take apixaban short-term after a  Watchman placement.  - I would also like her evaluated for atrial fibrillation ablation.  I think she will tolerate meds poorly given her multiple medication intolerances.  - She can continue to use Toprol XL prn palpitations.  - I will refer to EP for afib ablation and Watchman evaluations.  2. Hemochromatosis: Heterozygote for C282Y.  She has periodic phlebotomies.  Echo in 4/23 did not suggest cardiac involvement by hemochomatosis (normal EF, no LVH).  3. Cystic structure behind right knee: ?Baker's cyst.  This  structure concerns her.  - I will order venous dopplers of the right leg.   Followup in 4 months.   Lori [name] 02/16/2023

## 2023-02-20 ENCOUNTER — Ambulatory Visit (HOSPITAL_COMMUNITY)
Admission: RE | Admit: 2023-02-20 | Payer: PPO | Source: Ambulatory Visit | Attending: Cardiology | Admitting: Cardiology

## 2023-02-24 ENCOUNTER — Ambulatory Visit (HOSPITAL_COMMUNITY)
Admission: RE | Admit: 2023-02-24 | Payer: PPO | Source: Ambulatory Visit | Attending: Internal Medicine | Admitting: Internal Medicine

## 2023-02-26 ENCOUNTER — Inpatient Hospital Stay: Payer: PPO

## 2023-02-26 ENCOUNTER — Ambulatory Visit (HOSPITAL_COMMUNITY)
Admission: RE | Admit: 2023-02-26 | Discharge: 2023-02-26 | Disposition: A | Discharge status: Home / Self Care | Payer: PPO | Source: Ambulatory Visit | Attending: Cardiology | Admitting: Cardiology

## 2023-02-26 ENCOUNTER — Telehealth: Payer: Self-pay | Admitting: Cardiovascular Disease

## 2023-02-26 ENCOUNTER — Inpatient Hospital Stay: Payer: PPO | Attending: Hematology & Oncology

## 2023-02-26 ENCOUNTER — Inpatient Hospital Stay: Payer: PPO | Admitting: Hematology & Oncology

## 2023-02-26 VITALS — BP 120/61 | HR 79 | Temp 98.0°F | Resp 20

## 2023-02-26 DIAGNOSIS — L03115 Cellulitis of right lower limb: Secondary | ICD-10-CM | POA: Diagnosis not present

## 2023-02-26 DIAGNOSIS — Z95828 Presence of other vascular implants and grafts: Secondary | ICD-10-CM

## 2023-02-26 DIAGNOSIS — L02619 Cutaneous abscess of unspecified foot: Secondary | ICD-10-CM | POA: Insufficient documentation

## 2023-02-26 DIAGNOSIS — L03119 Cellulitis of unspecified part of limb: Secondary | ICD-10-CM | POA: Insufficient documentation

## 2023-02-26 DIAGNOSIS — R1032 Left lower quadrant pain: Secondary | ICD-10-CM

## 2023-02-26 DIAGNOSIS — I480 Paroxysmal atrial fibrillation: Secondary | ICD-10-CM | POA: Insufficient documentation

## 2023-02-26 DIAGNOSIS — L02611 Cutaneous abscess of right foot: Secondary | ICD-10-CM | POA: Diagnosis not present

## 2023-02-26 DIAGNOSIS — Z7901 Long term (current) use of anticoagulants: Secondary | ICD-10-CM | POA: Insufficient documentation

## 2023-02-26 LAB — CMP (CANCER CENTER ONLY)
ALT: 18 U/L (ref 0–44)
AST: 23 U/L (ref 15–41)
Albumin: 4.1 g/dL (ref 3.5–5.0)
Alkaline Phosphatase: 73 U/L (ref 38–126)
Anion gap: 7 (ref 5–15)
BUN: 19 mg/dL (ref 8–23)
CO2: 27 mmol/L (ref 22–32)
Calcium: 9.6 mg/dL (ref 8.9–10.3)
Chloride: 105 mmol/L (ref 98–111)
Creatinine: 0.6 mg/dL (ref 0.44–1.00)
GFR, Estimated: >60 mL/min (ref >60)
Glucose, Bld: 91 mg/dL (ref 70–99)
Potassium: 3.9 mmol/L (ref 3.5–5.1)
Sodium: 139 mmol/L (ref 135–145)
Total Bilirubin: 0.5 mg/dL (ref 0.3–1.2)
Total Protein: 6.6 g/dL (ref 6.5–8.1)

## 2023-02-26 LAB — CBC WITH DIFFERENTIAL (CANCER CENTER ONLY)
Abs Immature Granulocytes: 0.05 10*3/uL (ref 0.00–0.07)
Basophils Absolute: 0 10*3/uL (ref 0.0–0.1)
Basophils Relative: 1 %
Eosinophils Absolute: 0.1 10*3/uL (ref 0.0–0.5)
Eosinophils Relative: 2 %
HCT: 42 % (ref 36.0–46.0)
Hemoglobin: 14.3 g/dL (ref 12.0–15.0)
Immature Granulocytes: 1 %
Lymphocytes Relative: 34 %
Lymphs Abs: 2.1 10*3/uL (ref 0.7–4.0)
MCH: 32.5 pg (ref 26.0–34.0)
MCHC: 34 g/dL (ref 30.0–36.0)
MCV: 95.5 fL (ref 80.0–100.0)
Monocytes Absolute: 0.6 10*3/uL (ref 0.1–1.0)
Monocytes Relative: 10 %
Neutro Abs: 3.3 10*3/uL (ref 1.7–7.7)
Neutrophils Relative %: 52 %
Platelet Count: 201 10*3/uL (ref 150–400)
RBC: 4.4 MIL/uL (ref 3.87–5.11)
RDW: 11.9 % (ref 11.5–15.5)
WBC Count: 6.2 10*3/uL (ref 4.0–10.5)
nRBC: 0 % (ref 0.0–0.2)

## 2023-02-26 LAB — FERRITIN: Ferritin: 29 ng/mL (ref 11–307)

## 2023-02-26 LAB — RETICULOCYTES
Immature Retic Fract: 4.7 % (ref 2.3–15.9)
RBC.: 4.35 MIL/uL (ref 3.87–5.11)
Retic Count, Absolute: 58.3 10*3/uL (ref 19.0–186.0)
Retic Ct Pct: 1.3 % (ref 0.4–3.1)

## 2023-02-26 LAB — IRON AND IRON BINDING CAPACITY (CC-WL,HP ONLY)
Iron: 167 ug/dL (ref 28–170)
Saturation Ratios: 59 % — ABNORMAL HIGH (ref 10.4–31.8)
TIBC: 283 ug/dL (ref 250–450)
UIBC: 116 ug/dL — ABNORMAL LOW (ref 148–442)

## 2023-02-26 MED ORDER — SODIUM CHLORIDE 0.9% FLUSH
10.0000 mL | INTRAVENOUS | Status: DC | PRN
  Administered 2023-02-26: 10 mL via INTRAVENOUS

## 2023-02-26 MED ORDER — HEPARIN SOD (PORK) LOCK FLUSH 100 UNIT/ML IV SOLN
500.0000 [IU] | Freq: Once | INTRAVENOUS | Status: AC
  Administered 2023-02-26: 500 [IU] via INTRAVENOUS

## 2023-02-26 NOTE — Telephone Encounter (Signed)
Spoke with Janann August, she called to give vascular report for patient. She states no dvt, no baker cyst. The area she was complaining of she has varicose veins. She does have reflex in that leg and in the varicose vein.

## 2023-02-26 NOTE — Telephone Encounter (Signed)
Vascular Lab is calling to give a pulmonary report. Call transferred to triage.

## 2023-02-26 NOTE — Patient Instructions (Signed)

## 2023-03-05 ENCOUNTER — Encounter: Payer: Self-pay | Admitting: Hematology & Oncology

## 2023-03-05 ENCOUNTER — Other Ambulatory Visit: Payer: Self-pay

## 2023-03-05 ENCOUNTER — Inpatient Hospital Stay: Payer: PPO

## 2023-03-05 ENCOUNTER — Inpatient Hospital Stay (HOSPITAL_BASED_OUTPATIENT_CLINIC_OR_DEPARTMENT_OTHER): Payer: PPO | Admitting: Hematology & Oncology

## 2023-03-05 VITALS — BP 107/67 | HR 79 | Resp 17

## 2023-03-05 DIAGNOSIS — M351 Other overlap syndromes: Secondary | ICD-10-CM | POA: Diagnosis not present

## 2023-03-05 DIAGNOSIS — Z95828 Presence of other vascular implants and grafts: Secondary | ICD-10-CM

## 2023-03-05 DIAGNOSIS — E860 Dehydration: Secondary | ICD-10-CM

## 2023-03-05 MED ORDER — SODIUM CHLORIDE 0.9 % IV SOLN: Freq: Once | INTRAVENOUS | Status: AC

## 2023-03-05 MED ORDER — HEPARIN SOD (PORK) LOCK FLUSH 100 UNIT/ML IV SOLN
500.0000 [IU] | Freq: Once | INTRAVENOUS | Status: AC
  Administered 2023-03-05: 500 [IU] via INTRAVENOUS

## 2023-03-05 MED ORDER — SODIUM CHLORIDE 0.9% FLUSH
10.0000 mL | Freq: Once | INTRAVENOUS | Status: AC
  Administered 2023-03-05: 10 mL via INTRAVENOUS

## 2023-03-05 NOTE — Patient Instructions (Signed)

## 2023-03-05 NOTE — Progress Notes (Signed)
Hematology and Oncology Follow Up Visit  Lori [name] [id number] [birth year] 77 y.o. 03/05/2023   Principle Diagnosis:  Hemochromatosis - heterozygous for the C282Y mutation Paroxysmal A fib   Current Therapy:        Phlebotomy as indicated to keep ferritin < 35  (changed per patient request 06/24/17) Eliquis 5 mg po bid -- patient stopped 3 days ago due to bleeding   Interim History:  Lori [name] is here today for follow-up.  Apparently, she feels that she might be getting too much blood taken off.  She apparently has some problems after she left the office last time.  She thinks that it would be better if we saw her more frequently and took off a lesser amount of blood.  I think this is reasonable.  When we last saw her, the ferritin was 29 with iron saturation of 59%.  We are phlebotomizing her.  I think would be best if we had her come back in 6 weeks and rechecked at that point.  She does have apparently B12 deficiency.  I am not sure exactly if that is truly the case.  We will check her B12 level when we see her back.  She has says she has problems absorbing other vitamins.  I know that she has seen a naturopathic specialist.  Unfortunate, I do not think she can afford to see him right now because of insurance issues.  Thankfully, she has had no problems with COVID.  She has had no issues with nausea or vomiting.  She has had no change in bowel or bladder habits.  She has had no issues with rashes.  There is been no leg swelling.  She is on Eliquis.  She has a history of paroxysmal atrial fibrillation.  Currently, I would have said that her performance status is probably ECOG 1.   Medications:  Allergies as of 03/05/2023       Reactions   Amoxicillin Nausea And Vomiting   Codeine Nausea Only   Keflex [cephalexin] Itching   Other Nausea Only, Other (See Comments)   Beef Chicken Dairy Eggs Pt stated, "My body can't GMO. Nausea and indegestion makes me sick"    Albumin (human)    Pt stated, "I am not allergic to this and don't know what this means"; 04/27/17   Eliquis [apixaban] Other (See Comments)   Blood in stool,joint pain dizziness   Erythromycin Nausea Only, Other (See Comments)   Iodinated Contrast Media Other (See Comments)   Meperidine    Pt stated, "I don't know what this is"; 04/27/17   Metoclopramide Hcl    Metoprolol    Tired fatigue itching and hdizziness   Percocet [oxycodone-acetaminophen]    Made pt sick    Shellfish Allergy Other (See Comments)   Pt stated, "I am not allergic to this anymore"; 04/27/17   Shellfish-derived Products    Oxycodone Nausea And Vomiting   Pt stated, "I do not know if I am allergic to this"; 04/27/17        Medication List        Accurate as of March 05, 2023 10:50 AM. If you have any questions, ask your nurse or doctor.          amiloride-hydrochlorothiazide 5-50 MG tablet Commonly known as: MODURETIC Take 0.5 tablets by mouth daily. As Needed   cyanocobalamin 1000 MCG tablet Commonly known as: VITAMIN B12 Take 1,000 mcg by mouth daily.   fish oil-omega-3 fatty acids 1000 MG capsule  Take 1 g by mouth daily.   melatonin 5 MG Tabs Take 5 mg by mouth at bedtime.   MULTIVITAMIN PO Take by mouth.   potassium chloride SA 20 MEQ tablet Commonly known as: KLOR-CON M Take 20 mEq by mouth daily.   PRASTERONE (DHEA) PO Take 28 mg by mouth daily.   PROBIOTIC DAILY PO Take 1 capsule by mouth daily.   thyroid 60 MG tablet Commonly known as: ARMOUR Take 60 mg by mouth daily before breakfast.   thyroid 15 MG tablet Commonly known as: ARMOUR Take 15 mg by mouth. Only Monday-Friday   vitamin C 1000 MG tablet Take 3,000 mg by mouth daily.   VITAMIN D PO Take 1 tablet by mouth daily.   zinc gluconate 50 MG tablet Take 50 mg by mouth daily.        Allergies:  Allergies  Allergen Reactions   Amoxicillin Nausea And Vomiting   Codeine Nausea Only   Keflex [Cephalexin]  Itching   Other Nausea Only and Other (See Comments)    Beef Chicken Dairy Eggs  Pt stated, "My body can't GMO. Nausea and indegestion makes me sick"   Albumin (Human)     Pt stated, "I am not allergic to this and don't know what this means"; 04/27/17   Eliquis [Apixaban] Other (See Comments)    Blood in stool,joint pain dizziness   Erythromycin Nausea Only and Other (See Comments)   Iodinated Contrast Media Other (See Comments)   Meperidine     Pt stated, "I don't know what this is"; 04/27/17   Metoclopramide Hcl    Metoprolol     Tired fatigue itching and hdizziness   Percocet [Oxycodone-Acetaminophen]     Made pt sick    Shellfish Allergy Other (See Comments)    Pt stated, "I am not allergic to this anymore"; 04/27/17   Shellfish-Derived Products    Oxycodone Nausea And Vomiting    Pt stated, "I do not know if I am allergic to this"; 04/27/17    Past Medical History, Surgical history, Social history, and Family History were reviewed and updated.  Review of Systems: Review of Systems  Constitutional: Negative.   HENT: Negative.    Eyes: Negative.   Respiratory: Negative.    Cardiovascular:  Positive for palpitations.  Gastrointestinal:  Positive for blood in stool.  Genitourinary: Negative.   Musculoskeletal: Negative.   Skin: Negative.   Neurological: Negative.   Endo/Heme/Allergies:  Bruises/bleeds easily.  Psychiatric/Behavioral: Negative.       Physical Exam:  height is 5\' 7"  (1.702 m) and weight is 154 lb 14.4 oz (70.3 kg). Her oral temperature is 98.8 F (37.1 C). Her blood pressure is 112/42 (abnormal) and her pulse is 86. Her respiration is 18 and oxygen saturation is 100%.   Wt Readings from Last 3 Encounters:  03/05/23 154 lb 14.4 oz (70.3 kg)  02/16/23 153 lb 6.4 oz (69.6 kg)  12/18/22 153 lb (69.4 kg)    Physical Exam Vitals reviewed.  HENT:     Head: Normocephalic and atraumatic.  Eyes:     Pupils: Pupils are equal, round, and reactive to  light.  Cardiovascular:     Rate and Rhythm: Normal rate and regular rhythm.     Heart sounds: Normal heart sounds.     Comments: Cardiac exam is regular rate and rhythm.  She does have occasional erratic beats.  These are very limited.  She has no murmurs. Pulmonary:     Effort: Pulmonary effort  is normal.     Breath sounds: Normal breath sounds.     Comments: Lungs sound clear to percussion and auscultation bilaterally.  She has good air movement bilaterally. Abdominal:     General: Bowel sounds are normal.     Palpations: Abdomen is soft.     Comments: Abdomen is soft.  She has good bowel sounds.  There is no guarding or rebound tenderness.  There is no obvious fluid wave.  There is no palpable liver or spleen tip.  Musculoskeletal:        General: No tenderness or deformity. Normal range of motion.     Cervical back: Normal range of motion.  Lymphadenopathy:     Cervical: No cervical adenopathy.  Skin:    General: Skin is warm and dry.     Findings: No erythema or rash.  Neurological:     Mental Status: She is alert and oriented to person, place, and time.  Psychiatric:        Behavior: Behavior normal.        Thought Content: Thought content normal.        Judgment: Judgment normal.     Lab Results  Component Value Date   WBC 6.2 02/26/2023   HGB 14.3 02/26/2023   HCT 42.0 02/26/2023   MCV 95.5 02/26/2023   PLT 201 02/26/2023   Lab Results  Component Value Date   FERRITIN 29 02/26/2023   IRON 167 02/26/2023   TIBC 283 02/26/2023   UIBC 116 (L) 02/26/2023   IRONPCTSAT 59 (H) 02/26/2023   Lab Results  Component Value Date   RETICCTPCT 1.3 02/26/2023   RBC 4.35 02/26/2023   RBC 4.40 02/26/2023   No results found for: "KPAFRELGTCHN", "LAMBDASER", "KAPLAMBRATIO" No results found for: "IGGSERUM", "IGA", "IGMSERUM" No results found for: "TOTALPROTELP", "ALBUMINELP", "A1GS", "A2GS", "BETS", "BETA2SER", "GAMS", "MSPIKE", "SPEI"   Chemistry      Component Value  Date/Time   NA 139 02/26/2023 1335   NA 141 09/04/2021 0844   NA 143 03/11/2017 1404   NA 139 07/02/2016 1139   K 3.9 02/26/2023 1335   K 3.9 03/11/2017 1404   K 4.1 07/02/2016 1139   CL 105 02/26/2023 1335   CL 105 03/11/2017 1404   CO2 27 02/26/2023 1335   CO2 31 03/11/2017 1404   CO2 26 07/02/2016 1139   BUN 19 02/26/2023 1335   BUN 15 09/04/2021 0844   BUN 15 03/11/2017 1404   BUN 20.3 07/02/2016 1139   CREATININE 0.60 02/26/2023 1335   CREATININE 0.73 06/23/2022 1141   CREATININE 0.8 07/02/2016 1139      Component Value Date/Time   CALCIUM 9.6 02/26/2023 1335   CALCIUM 9.4 03/11/2017 1404   CALCIUM 10.0 07/02/2016 1139   ALKPHOS 73 02/26/2023 1335   ALKPHOS 69 03/11/2017 1404   ALKPHOS 87 07/02/2016 1139   AST 23 02/26/2023 1335   AST 27 07/02/2016 1139   ALT 18 02/26/2023 1335   ALT 32 03/11/2017 1404   ALT 34 07/02/2016 1139   BILITOT 0.5 02/26/2023 1335   BILITOT 0.55 07/02/2016 1139       Impression and Plan: Ms. Rill is a pleasant 77 yo caucasian female with hemochromatosis, heterozygous for the C282Y mutation.   We will certainly try to make life a little easier for her.  We will see about having her come back in 6 weeks and have her labs checked.  Again, we can take off a lesser amount of blood this would  certainly make things better for her.  I would also give her some IV fluids after a phlebotomy.  This could certainly help make her feel a bit better.  Again, we will check her vitamin B-12 level.  We will see what this looks like.  If she needs any supplemental vitamin B12, we can certainly do this.  I would like to see her back after she has her labs done in 6 weeks.  Josph Macho, MD 10/10/202410:50 AM

## 2023-03-05 NOTE — Progress Notes (Signed)
Phlebotomy started at 1135, ending at 1200, 500 mls of blood removed via port. Pt tolerated procedure well, fluids infusing after procedure.

## 2023-03-08 NOTE — Progress Notes (Unsigned)
Electrophysiology Office Note:    Date:  03/09/2023   ID:  Lori [name], DOB March 07, 1946, MRN [id number]  [facility] Cardiologist:  Nicki Guadalajara, MD  [facility] Electrophysiologist:  Laban Prude, MD   Referring MD: [name]., MD   Chief Complaint: AF  History of Present Illness:    Lori [name] is a 77 y.o. femalewho I am seeing today for an evaluation of AFat the request of Dr Shirlee Latch.  The patient was last seen by Dr Shirlee Latch on 02/16/2023.  The patient has a medical history that includes pAF, mixed connective tissue disorder, hemochromatosis with periodic phlebotomy. Her AF was dx in 07/2021 by [facility]. Her monitor showed a burden of 20%. She was previously on eliquis but experienced bleeding leading her to stop the medication.   At the appt with Dr Shirlee Latch she reported episodes of AF mulutiple times per week.   Very long conversation with the patient and her husband today in clinic about her atrial fibrillation, stroke risk, ablation and watchman.      Their past medical, social and family history was reveiwed.   ROS:   Please see the history of present illness.    All other systems reviewed and are negative.  EKGs/Labs/Other Studies Reviewed:    The following studies were reviewed today:  09/04/2021 echo EF 60% RV normal Mildly dilated lA Trivial MR  August 28, 2021 EKG shows atrial fibrillation/flutter      Physical Exam:    VS:  BP 110/78   Pulse 88   Ht 5' 6.5" (1.689 m)   Wt 150 lb (68 kg)   SpO2 100%   BMI 23.85 kg/m     Wt Readings from Last 3 Encounters:  03/09/23 150 lb (68 kg)  03/05/23 154 lb 14.4 oz (70.3 kg)  02/16/23 153 lb 6.4 oz (69.6 kg)     GEN:  Well nourished, well developed in no acute distress CARDIAC: Irregularly irregular, no murmurs, rubs, gallops RESPIRATORY:  Clear to auscultation without rales, wheezing or rhonchi       ASSESSMENT AND PLAN:    1. Paroxysmal atrial fibrillation (HCC)    2. Primary hemochromatosis (HCC)     #pAF #Atrial flutter Symptomatic. Multiple times per week. Previously on Eliquis but experienced GI bleeding requiring discontinuation. I have discussed treatment options with the patient including conservative therapy, antiarrhythmic drugs and catheter ablation. She would like to proceed with catheter ablation.  She will start xarelto 4 weeks prior to the catheter ablation.  I also discussed long term stroke mitigation strategies including OAC and LAAO. Given her history of GI bleeding, the patient would like to consider LAAO which I think is reasonable.  -----  Discussed treatment options today for AF including antiarrhythmic drug therapy and ablation. Discussed risks, recovery and likelihood of success with each treatment strategy. Risk, benefits, and alternatives to EP study and ablation for afib were discussed. These risks include but are not limited to stroke, bleeding, vascular damage, tamponade, perforation, damage to the esophagus, lungs, phrenic nerve and other structures, pulmonary vein stenosis, worsening renal function, coronary vasospasm and death.  Discussed potential need for repeat ablation procedures and antiarrhythmic drugs after an initial ablation. The patient understands these risk and wishes to proceed.  We will therefore proceed with catheter ablation at the next available time.  Carto, ICE, anesthesia are requested for the procedure.  Will also obtain CT PV protocol prior to the procedure to exclude LAA thrombus and further  evaluate atrial anatomy.  She will require contrast premedication prior to the CT scan and watchman procedures.  ----  I have seen Lori [name] in the office today who is being considered for a Watchman left atrial appendage closure device. I believe they will benefit from this procedure given their history of atrial fibrillation, CHA2DS2-VASc score of 4. Unfortunately, the patient is not felt to be a  long term anticoagulation candidate secondary to hx fo GI bleeding. The patient's chart has been reviewed and I feel that they would be a candidate for short term oral anticoagulation after Watchman implant.   It is my belief that after undergoing a LAA closure procedure, Lori [name] will not need long term anticoagulation which eliminates anticoagulation side effects and major bleeding risk.   Procedural risks for the Watchman implant have been reviewed with the patient including a 0.5% risk of stroke, <1% risk of perforation and <1% risk of device embolization. Other risks include bleeding, vascular damage, tamponade, worsening renal function, and death. The patient understands these risk and wishes to proceed.     The published clinical data on the safety and effectiveness of WATCHMAN include but are not limited to the following: - Holmes DR, Everlene Farrier, Sick P et al. for the PROTECT AF Investigators. Percutaneous closure of the left atrial appendage versus warfarin therapy for prevention of stroke in patients with atrial fibrillation: a randomised non-inferiority trial. Lancet 2009; 374: 534-42. Everlene Farrier, Doshi SK, Isa Rankin D et al. on behalf of the PROTECT AF Investigators. Percutaneous Left Atrial Appendage Closure for Stroke Prophylaxis in Patients With Atrial Fibrillation 2.3-Year Follow-up of the PROTECT AF (Watchman Left Atrial Appendage System for Embolic Protection in Patients With Atrial Fibrillation) Trial. Circulation 2013; 127:720-729. - Alli O, Doshi S,  Kar S, Reddy VY, Sievert H et al. Quality of Life Assessment in the Randomized PROTECT AF (Percutaneous Closure of the Left Atrial Appendage Versus Warfarin Therapy for Prevention of Stroke in Patients With Atrial Fibrillation) Trial of Patients at Risk for Stroke With Nonvalvular Atrial Fibrillation. J Am Coll Cardiol 2013; 61:1790-8. Aline August DR, Mia [place], Price M, Whisenant B, Sievert H, Doshi S, Huber K, Reddy V.  Prospective randomized evaluation of the Watchman left atrial appendage Device in patients with atrial fibrillation versus long-term warfarin therapy; the PREVAIL trial. Journal of the [employer] of Cardiology, Vol. 4, No. 1, 2014, 1-11. - Kar S, Doshi SK, Sadhu A, Horton R, Osorio J et al. Primary outcome evaluation of a next-generation left atrial appendage closure device: results from the PINNACLE FLX trial. Circulation 2021;143(18)1754-1762.    After today's visit with the patient which was dedicated solely for shared decision making visit regarding LAA closure device, the patient decided to proceed with the LAA appendage closure procedure scheduled to be done in the near future at [facility]. Prior to the procedure, I would like to obtain a gated CT scan of the chest with contrast timed for PV/LA visualization.     HAS-BLED score 2 Hypertension No  Abnormal renal and liver function (Dialysis, transplant, Cr >2.26 mg/dL /Cirrhosis or Bilirubin >2x Normal or AST/ALT/AP >3x Normal) No  Stroke No  Bleeding Yes  Labile INR (Unstable/high INR) No  Elderly (>65) Yes  Drugs or alcohol (>= 8 drinks/week, anti-plt or NSAID) No   CHA2DS2-VASc Score = 4  The patient's score is based upon: CHF History: 0 HTN History: 0 Diabetes History: 0 Stroke History: 0 Vascular Disease History: 1 (  aortic atherosclerosis) Age Score: 2 Gender Score: 1   #Hemochromatosis Follows with Dr Myna Hidalgo in heme/onc. No known cardiac involvement. Cont w intermittent phlebotomy per heme/onc.   Signed, Rossie Muskrat. Lalla Brothers, MD, [facility], [facility] 03/09/2023 2:18 PM    Electrophysiology  [facility]

## 2023-03-09 ENCOUNTER — Ambulatory Visit: Payer: PPO | Attending: Cardiology | Admitting: Cardiology

## 2023-03-09 ENCOUNTER — Encounter: Payer: Self-pay | Admitting: Cardiology

## 2023-03-09 VITALS — BP 110/78 | HR 88 | Ht 66.5 in | Wt 150.0 lb

## 2023-03-09 DIAGNOSIS — I480 Paroxysmal atrial fibrillation: Secondary | ICD-10-CM | POA: Diagnosis not present

## 2023-03-09 MED ORDER — RIVAROXABAN 20 MG PO TABS: 20.0000 mg | ORAL_TABLET | Freq: Every day | ORAL | 3 refills | Status: DC

## 2023-03-09 NOTE — Addendum Note (Signed)
Addended by: Frutoso Schatz on: 03/09/2023 03:15 PM   Modules accepted: Orders

## 2023-03-09 NOTE — Patient Instructions (Addendum)
Medication Instructions:  Your physician has recommended you make the following change in your medication:  1) START taking Xarelto 20 mg daily   *If you need a refill on your cardiac medications before your next appointment, please call your pharmacy*  Lab Work: BMET and CBC - prior to your CT scan.  Testing/Procedures: Your physician has requested that you have cardiac CT. Cardiac computed tomography (CT) is a painless test that uses an x-ray machine to take clear, detailed pictures of your heart. For further information please visit [email or web address] We will call you to schedule your CT scan. It will be done about 3 weeks prior to your ablation.  Your physician has recommended that you have an ablation. Catheter ablation is a medical procedure used to treat some cardiac arrhythmias (irregular heartbeats). During catheter ablation, a long, thin, flexible tube is put into a blood vessel in your groin (upper thigh), or neck. This tube is called an ablation catheter. It is then guided to your heart through the blood vessel. Radio frequency waves destroy small areas of heart tissue where abnormal heartbeats may cause an arrhythmia to start. You are scheduled for Atrial Fibrillation Ablation on Monday, January 27 with Dr. Steffanie Dunn.Please arrive at the Main Entrance A at [facility]: [address] [place], [place] [address] at 8:30 AM   Your physician has requested that you have Left atrial appendage (LAA) closure device implantation is a procedure to put a small device in the LAA of the heart. The LAA is a small sac in the wall of the heart's left upper chamber. Blood clots can form in this area. The device, Watchman closes the LAA to help prevent a blood clot and stroke.   Follow-Up: At [facility], you and your health needs are our priority.  As part of our continuing mission to provide you with exceptional heart care, we have created designated Provider Care Teams.   These Care Teams include your primary Cardiologist (physician) and Advanced Practice Providers (APPs -  Physician Assistants and Nurse Practitioners) who all work together to provide you with the care you need, when you need it.   Your next appointment:   We will call you to arrange your follow up appointments.

## 2023-03-10 ENCOUNTER — Encounter (HOSPITAL_COMMUNITY): Payer: PPO | Admitting: Cardiology

## 2023-03-11 DIAGNOSIS — M25561 Pain in right knee: Secondary | ICD-10-CM | POA: Diagnosis not present

## 2023-03-20 DIAGNOSIS — M1711 Unilateral primary osteoarthritis, right knee: Secondary | ICD-10-CM | POA: Diagnosis not present

## 2023-03-20 DIAGNOSIS — S83281D Other tear of lateral meniscus, current injury, right knee, subsequent encounter: Secondary | ICD-10-CM | POA: Diagnosis not present

## 2023-04-02 DIAGNOSIS — R7301 Impaired fasting glucose: Secondary | ICD-10-CM | POA: Diagnosis not present

## 2023-04-02 DIAGNOSIS — M810 Age-related osteoporosis without current pathological fracture: Secondary | ICD-10-CM | POA: Diagnosis not present

## 2023-04-02 DIAGNOSIS — E039 Hypothyroidism, unspecified: Secondary | ICD-10-CM | POA: Diagnosis not present

## 2023-04-02 DIAGNOSIS — E785 Hyperlipidemia, unspecified: Secondary | ICD-10-CM | POA: Diagnosis not present

## 2023-04-03 DIAGNOSIS — R82998 Other abnormal findings in urine: Secondary | ICD-10-CM | POA: Diagnosis not present

## 2023-04-09 ENCOUNTER — Ambulatory Visit: Payer: PPO | Admitting: Cardiovascular Disease

## 2023-04-09 DIAGNOSIS — Z148 Genetic carrier of other disease: Secondary | ICD-10-CM | POA: Diagnosis not present

## 2023-04-09 DIAGNOSIS — Z800 Family history of malignant neoplasm of digestive organs: Secondary | ICD-10-CM | POA: Diagnosis not present

## 2023-04-09 DIAGNOSIS — K625 Hemorrhage of anus and rectum: Secondary | ICD-10-CM | POA: Diagnosis not present

## 2023-04-09 DIAGNOSIS — D692 Other nonthrombocytopenic purpura: Secondary | ICD-10-CM | POA: Diagnosis not present

## 2023-04-09 DIAGNOSIS — R7301 Impaired fasting glucose: Secondary | ICD-10-CM | POA: Diagnosis not present

## 2023-04-09 DIAGNOSIS — E785 Hyperlipidemia, unspecified: Secondary | ICD-10-CM | POA: Diagnosis not present

## 2023-04-09 DIAGNOSIS — I480 Paroxysmal atrial fibrillation: Secondary | ICD-10-CM | POA: Diagnosis not present

## 2023-04-09 DIAGNOSIS — D6869 Other thrombophilia: Secondary | ICD-10-CM | POA: Diagnosis not present

## 2023-04-09 DIAGNOSIS — N390 Urinary tract infection, site not specified: Secondary | ICD-10-CM | POA: Diagnosis not present

## 2023-04-09 DIAGNOSIS — Z0000 Encounter for general adult medical examination without abnormal findings: Secondary | ICD-10-CM | POA: Diagnosis not present

## 2023-04-09 DIAGNOSIS — M351 Other overlap syndromes: Secondary | ICD-10-CM | POA: Diagnosis not present

## 2023-04-09 DIAGNOSIS — E039 Hypothyroidism, unspecified: Secondary | ICD-10-CM | POA: Diagnosis not present

## 2023-05-08 ENCOUNTER — Inpatient Hospital Stay: Payer: PPO

## 2023-05-08 ENCOUNTER — Inpatient Hospital Stay: Payer: PPO | Attending: Hematology & Oncology

## 2023-05-08 DIAGNOSIS — M351 Other overlap syndromes: Secondary | ICD-10-CM

## 2023-05-08 LAB — CMP (CANCER CENTER ONLY)
ALT: 20 U/L (ref 0–44)
AST: 26 U/L (ref 15–41)
Albumin: 4.3 g/dL (ref 3.5–5.0)
Alkaline Phosphatase: 67 U/L (ref 38–126)
Anion gap: 8 (ref 5–15)
BUN: 19 mg/dL (ref 8–23)
CO2: 26 mmol/L (ref 22–32)
Calcium: 10 mg/dL (ref 8.9–10.3)
Chloride: 105 mmol/L (ref 98–111)
Creatinine: 0.64 mg/dL (ref 0.44–1.00)
GFR, Estimated: >60 mL/min (ref >60)
Glucose, Bld: 89 mg/dL (ref 70–99)
Potassium: 3.9 mmol/L (ref 3.5–5.1)
Sodium: 139 mmol/L (ref 135–145)
Total Bilirubin: 0.6 mg/dL (ref <1.2)
Total Protein: 6.8 g/dL (ref 6.5–8.1)

## 2023-05-08 LAB — VITAMIN B12: Vitamin B-12: 2892 pg/mL — ABNORMAL HIGH (ref 180–914)

## 2023-05-08 LAB — CBC WITH DIFFERENTIAL (CANCER CENTER ONLY)
Abs Immature Granulocytes: 0.01 10*3/uL (ref 0.00–0.07)
Basophils Absolute: 0 10*3/uL (ref 0.0–0.1)
Basophils Relative: 0 %
Eosinophils Absolute: 0.1 10*3/uL (ref 0.0–0.5)
Eosinophils Relative: 1 %
HCT: 42.6 % (ref 36.0–46.0)
Hemoglobin: 14.5 g/dL (ref 12.0–15.0)
Immature Granulocytes: 0 %
Lymphocytes Relative: 33 %
Lymphs Abs: 2.2 10*3/uL (ref 0.7–4.0)
MCH: 31.7 pg (ref 26.0–34.0)
MCHC: 34 g/dL (ref 30.0–36.0)
MCV: 93 fL (ref 80.0–100.0)
Monocytes Absolute: 0.6 10*3/uL (ref 0.1–1.0)
Monocytes Relative: 9 %
Neutro Abs: 3.9 10*3/uL (ref 1.7–7.7)
Neutrophils Relative %: 57 %
Platelet Count: 197 10*3/uL (ref 150–400)
RBC: 4.58 MIL/uL (ref 3.87–5.11)
RDW: 12.4 % (ref 11.5–15.5)
WBC Count: 6.9 10*3/uL (ref 4.0–10.5)
nRBC: 0 % (ref 0.0–0.2)

## 2023-05-08 LAB — IRON AND IRON BINDING CAPACITY (CC-WL,HP ONLY)
Iron: 145 ug/dL (ref 28–170)
Saturation Ratios: 47 % — ABNORMAL HIGH (ref 10.4–31.8)
TIBC: 311 ug/dL (ref 250–450)
UIBC: 166 ug/dL (ref 148–442)

## 2023-05-08 LAB — RETICULOCYTES
Immature Retic Fract: 8.3 % (ref 2.3–15.9)
RBC.: 4.52 MIL/uL (ref 3.87–5.11)
Retic Count, Absolute: 54.2 10*3/uL (ref 19.0–186.0)
Retic Ct Pct: 1.2 % (ref 0.4–3.1)

## 2023-05-08 LAB — FERRITIN: Ferritin: 23 ng/mL (ref 11–307)

## 2023-05-08 MED ORDER — HEPARIN SOD (PORK) LOCK FLUSH 100 UNIT/ML IV SOLN: 500.0000 [IU] | Freq: Once | INTRAVENOUS | Status: DC

## 2023-05-08 MED ORDER — SODIUM CHLORIDE 0.9% FLUSH: 10.0000 mL | INTRAVENOUS | Status: DC | PRN

## 2023-05-08 NOTE — Patient Instructions (Signed)
Implanted Port Removal, Care After The following information offers guidance on how to care for yourself after your procedure. Your health care provider may also give you more specific instructions. If you have problems or questions, contact your health care provider. What can I expect after the procedure? After the procedure, it is common to have: Soreness or pain near your incision. Some swelling or bruising near your incision. Follow these instructions at home: Medicines Take over-the-counter and prescription medicines only as told by your health care provider. If you were prescribed an antibiotic medicine, take it as told by your health care provider. Do not stop taking the antibiotic even if you start to feel better. Bathing Do not take baths, swim, or use a hot tub until your health care provider approves. Ask your health care provider if you can take showers. You may only be allowed to take sponge baths. Incision care  Follow instructions from your health care provider about how to take care of your incision. Make sure you: Wash your hands with soap and water for at least 20 seconds before and after you change your bandage (dressing). If soap and water are not available, use hand sanitizer. Change your dressing as told by your health care provider. Keep your dressing dry. Leave stitches (sutures), skin glue, or adhesive strips in place. These skin closures may need to stay in place for 2 weeks or longer. If adhesive strip edges start to loosen and curl up, you may trim the loose edges. Do not remove adhesive strips completely unless your health care provider tells you to do that. Check your incision area every day for signs of infection. Check for: More redness, swelling, or pain. More fluid or blood. Warmth. Pus or a bad smell. Activity Return to your normal activities as told by your health care provider. Ask your health care provider what activities are safe for you. You may have  to avoid lifting. Ask your health care provider how much you can safely lift. Do not do activities that involve lifting your arms over your head. Driving  If you were given a sedative during the procedure, it can affect you for several hours. Do not drive or operate machinery until your health care provider says that it is safe. If you did not receive a sedative, ask your health care provider when it is safe to drive. General instructions Do not use any products that contain nicotine or tobacco. These products include cigarettes, chewing tobacco, and vaping devices, such as e-cigarettes. These can delay healing after surgery. If you need help quitting, ask your health care provider. Keep all follow-up visits. This is important. Contact a health care provider if: You have a fever or chills. You have more redness, swelling, or pain around your incision. You have more fluid or blood coming from your incision. Your incision feels warm to the touch. You have pus or a bad smell coming from your incision. You have pain that is not relieved by your pain medicine. Get help right away if: You have chest pain. You have difficulty breathing. These symptoms may be an emergency. Get help right away. Call 911. Do not wait to see if the symptoms will go away. Do not drive yourself to the hospital. Summary After the procedure, it is common to have pain, soreness, swelling, or bruising near your incision. If you were prescribed an antibiotic medicine, take it as told by your health care provider. Do not stop taking the antibiotic even if you   start to feel better. If you were given a sedative during the procedure, it can affect you for several hours. Do not drive or operate machinery until your health care provider says that it is safe. Return to your normal activities as told by your health care provider. Ask your health care provider what activities are safe for you. This information is not intended to  replace advice given to you by your health care provider. Make sure you discuss any questions you have with your health care provider. Document Revised: 11/13/2020 Document Reviewed: 11/13/2020 Elsevier Patient Education  2024 [employer]  

## 2023-05-10 LAB — VITAMIN B1: Vitamin B1 (Thiamine): 286.5 nmol/L — ABNORMAL HIGH (ref 66.5–200.0)

## 2023-05-11 ENCOUNTER — Encounter: Payer: Self-pay | Admitting: *Deleted

## 2023-05-13 LAB — VITAMIN B6: Vitamin B6: 49.9 ug/L (ref 3.4–65.2)

## 2023-05-15 ENCOUNTER — Inpatient Hospital Stay: Payer: PPO | Admitting: Hematology & Oncology

## 2023-05-15 DIAGNOSIS — M25551 Pain in right hip: Secondary | ICD-10-CM | POA: Diagnosis not present

## 2023-05-15 DIAGNOSIS — S83281D Other tear of lateral meniscus, current injury, right knee, subsequent encounter: Secondary | ICD-10-CM | POA: Diagnosis not present

## 2023-05-18 ENCOUNTER — Telehealth: Payer: Self-pay | Admitting: Cardiology

## 2023-05-18 ENCOUNTER — Other Ambulatory Visit: Payer: Self-pay

## 2023-05-18 MED ORDER — PREDNISONE 50 MG PO TABS: ORAL_TABLET | ORAL | 0 refills | Status: DC

## 2023-05-18 MED ORDER — DIPHENHYDRAMINE HCL 50 MG PO TABS
50.0000 mg | ORAL_TABLET | Freq: Once | ORAL | 0 refills | Status: DC
Start: 2023-05-18 — End: 2023-08-05

## 2023-05-18 NOTE — Telephone Encounter (Signed)
  The patient's spouse, Roseanne Reno, said they need to reschedule the patient's AFib ablation procedure. He mentioned that the apartment they have been waiting for is finally available, and their move date will be 06/23/23. They would like to reschedule the procedure for a month later. He asked to be contacted directly, as he is the one who knows their schedule, and his wife's Diplomatic Services operational officer. :)

## 2023-05-18 NOTE — Telephone Encounter (Signed)
Spoke with the patient and rescheduled her ablation for 2/14

## 2023-05-25 ENCOUNTER — Telehealth (HOSPITAL_COMMUNITY): Payer: Self-pay | Admitting: *Deleted

## 2023-05-25 NOTE — Telephone Encounter (Signed)
Attempted to call patient regarding upcoming cardiac CT appointment. Left message on voicemail with name and callback number Hayley Sharpe RN Navigator Cardiac Imaging [facility] [phone] Office   

## 2023-05-26 ENCOUNTER — Ambulatory Visit (HOSPITAL_COMMUNITY)
Admission: RE | Admit: 2023-05-26 | Discharge: 2023-05-26 | Disposition: A | Discharge status: Home / Self Care | Payer: PPO | Source: Ambulatory Visit | Attending: Cardiology | Admitting: Cardiology

## 2023-05-26 DIAGNOSIS — I480 Paroxysmal atrial fibrillation: Secondary | ICD-10-CM | POA: Diagnosis not present

## 2023-05-26 MED ORDER — IOHEXOL 350 MG/ML SOLN
95.0000 mL | Freq: Once | INTRAVENOUS | Status: AC | PRN
  Administered 2023-05-26: 95 mL via INTRAVENOUS

## 2023-05-28 ENCOUNTER — Other Ambulatory Visit: Payer: Self-pay

## 2023-05-29 ENCOUNTER — Inpatient Hospital Stay: Payer: PPO

## 2023-05-29 ENCOUNTER — Encounter: Payer: Self-pay | Admitting: Hematology & Oncology

## 2023-05-29 ENCOUNTER — Inpatient Hospital Stay: Payer: PPO | Attending: Hematology & Oncology | Admitting: Hematology & Oncology

## 2023-05-29 ENCOUNTER — Inpatient Hospital Stay: Payer: PPO | Attending: Hematology & Oncology

## 2023-05-29 DIAGNOSIS — M810 Age-related osteoporosis without current pathological fracture: Secondary | ICD-10-CM

## 2023-05-29 DIAGNOSIS — Z452 Encounter for adjustment and management of vascular access device: Secondary | ICD-10-CM

## 2023-05-29 DIAGNOSIS — I480 Paroxysmal atrial fibrillation: Secondary | ICD-10-CM | POA: Insufficient documentation

## 2023-05-29 DIAGNOSIS — Z7901 Long term (current) use of anticoagulants: Secondary | ICD-10-CM | POA: Diagnosis not present

## 2023-05-29 LAB — CMP (CANCER CENTER ONLY)
ALT: 18 U/L (ref 0–44)
AST: 23 U/L (ref 15–41)
Albumin: 4.3 g/dL (ref 3.5–5.0)
Alkaline Phosphatase: 71 U/L (ref 38–126)
Anion gap: 7 (ref 5–15)
BUN: 24 mg/dL — ABNORMAL HIGH (ref 8–23)
CO2: 26 mmol/L (ref 22–32)
Calcium: 9.8 mg/dL (ref 8.9–10.3)
Chloride: 102 mmol/L (ref 98–111)
Creatinine: 0.75 mg/dL (ref 0.44–1.00)
GFR, Estimated: >60 mL/min (ref >60)
Glucose, Bld: 91 mg/dL (ref 70–99)
Potassium: 4.2 mmol/L (ref 3.5–5.1)
Sodium: 135 mmol/L (ref 135–145)
Total Bilirubin: 0.6 mg/dL (ref 0.0–1.2)
Total Protein: 6.6 g/dL (ref 6.5–8.1)

## 2023-05-29 LAB — CBC WITH DIFFERENTIAL (CANCER CENTER ONLY)
Abs Immature Granulocytes: 0.1 10*3/uL — ABNORMAL HIGH (ref 0.00–0.07)
Basophils Absolute: 0 10*3/uL (ref 0.0–0.1)
Basophils Relative: 0 %
Eosinophils Absolute: 0.1 10*3/uL (ref 0.0–0.5)
Eosinophils Relative: 1 %
HCT: 41.2 % (ref 36.0–46.0)
Hemoglobin: 14.2 g/dL (ref 12.0–15.0)
Immature Granulocytes: 1 %
Lymphocytes Relative: 30 %
Lymphs Abs: 2.6 10*3/uL (ref 0.7–4.0)
MCH: 32 pg (ref 26.0–34.0)
MCHC: 34.5 g/dL (ref 30.0–36.0)
MCV: 92.8 fL (ref 80.0–100.0)
Monocytes Absolute: 0.9 10*3/uL (ref 0.1–1.0)
Monocytes Relative: 10 %
Neutro Abs: 5.1 10*3/uL (ref 1.7–7.7)
Neutrophils Relative %: 58 %
Platelet Count: 210 10*3/uL (ref 150–400)
RBC: 4.44 MIL/uL (ref 3.87–5.11)
RDW: 12.9 % (ref 11.5–15.5)
WBC Count: 8.8 10*3/uL (ref 4.0–10.5)
nRBC: 0 % (ref 0.0–0.2)

## 2023-05-29 LAB — RETICULOCYTES
Immature Retic Fract: 10.1 % (ref 2.3–15.9)
RBC.: 4.5 MIL/uL (ref 3.87–5.11)
Retic Count, Absolute: 75.6 10*3/uL (ref 19.0–186.0)
Retic Ct Pct: 1.7 % (ref 0.4–3.1)

## 2023-05-29 LAB — VITAMIN B12: Vitamin B-12: 2493 pg/mL — ABNORMAL HIGH (ref 180–914)

## 2023-05-29 LAB — IRON AND IRON BINDING CAPACITY (CC-WL,HP ONLY)
Iron: 148 ug/dL (ref 28–170)
Saturation Ratios: 49 % — ABNORMAL HIGH (ref 10.4–31.8)
TIBC: 304 ug/dL (ref 250–450)
UIBC: 156 ug/dL (ref 148–442)

## 2023-05-29 LAB — FERRITIN: Ferritin: 19 ng/mL (ref 11–307)

## 2023-05-29 MED ORDER — HEPARIN SOD (PORK) LOCK FLUSH 100 UNIT/ML IV SOLN
500.0000 [IU] | Freq: Once | INTRAVENOUS | Status: AC
  Administered 2023-05-29: 500 [IU] via INTRAVENOUS

## 2023-05-29 MED ORDER — SODIUM CHLORIDE 0.9% FLUSH
10.0000 mL | INTRAVENOUS | Status: DC | PRN
  Administered 2023-05-29: 10 mL via INTRAVENOUS

## 2023-05-29 NOTE — Progress Notes (Signed)
 Hematology and Oncology Follow Up Visit  Lori [name] [id number] 01-25-1946 78 y.o. 05/29/2023   Principle Diagnosis:  Hemochromatosis - heterozygous for the C282Y mutation Paroxysmal A fib   Current Therapy:        Phlebotomy as indicated to keep ferritin < 35  (changed per patient request 06/24/17) or iron saturation less than 30% Xarelto  20 mg p.o. daily    Interim History:  Lori [name] is here today for follow-up.  I think the big news is that the atrial fibrillation has gone be taken care of I think with a ablation.  She is on Xarelto .  Sounds like Cardiology is managing this quite aggressively.  She is not a candidate for the Watchman procedure.  It sounds like the ablation will hopefully happen within the month.  She is doing well with her hemochromatosis.  She had a iron saturation of 47% a couple weeks ago.  We will go ahead and phlebotomize her.  We would want to keep her iron saturation down below than 30%.  She does have other health issues.  She does have I think an autoimmune issue.  She is on Plaquenil.  It sounds like she and her husband might actually moved to [place].  This would be part-time.  She is on thyroid  medication.  She is doing well with her blood pressure medication.  She has had no fever.  Thankfully, there is been no problems with COVID.  Overall, I would have to say that her performance status is probably ECOG 1.    Medications:  Allergies as of 05/29/2023       Reactions   Amoxicillin  Nausea And Vomiting   Codeine Nausea Only   Keflex [cephalexin] Itching   Other Nausea Only, Other (See Comments)   Beef Chicken Dairy Eggs Pt stated, My body can't GMO. Nausea and indegestion makes me sick   Albumin (human)    Pt stated, I am not allergic to this and don't know what this means; 04/27/17   Eliquis  [apixaban ] Other (See Comments)   Blood in stool,joint pain dizziness   Erythromycin Nausea Only, Other (See Comments)   Iodinated  Contrast Media Other (See Comments)   Meperidine    Pt stated, I don't know what this is; 04/27/17   Metoclopramide  Hcl    Metoprolol     Tired fatigue itching and hdizziness   Percocet [oxycodone -acetaminophen ]    Made pt sick    Shellfish Allergy Other (See Comments)   Pt stated, I am not allergic to this anymore; 04/27/17   Shellfish-derived Products    Oxycodone  Nausea And Vomiting   Pt stated, I do not know if I am allergic to this; 04/27/17        Medication List        Accurate as of May 29, 2023 12:16 PM. If you have any questions, ask your nurse or doctor.          STOP taking these medications    cyanocobalamin  1000 MCG tablet Commonly known as: VITAMIN B12 Stopped by: Maude JONELLE Crease       TAKE these medications    amiloride-hydrochlorothiazide 5-50 MG tablet Commonly known as: MODURETIC Take 0.5 tablets by mouth daily. As Needed   diphenhydrAMINE  50 MG tablet Commonly known as: BENADRYL  Take 1 tablet (50 mg total) by mouth once for 1 dose. Take 1 tablet 1 hour prior to your CT scan   fish oil-omega-3 fatty acids 1000 MG capsule Take 1 g by mouth  daily.   melatonin 5 MG Tabs Take 5 mg by mouth at bedtime.   meloxicam 15 MG tablet Commonly known as: MOBIC Take 15 mg by mouth.   MULTIVITAMIN PO Take by mouth.   potassium chloride  SA 20 MEQ tablet Commonly known as: KLOR-CON  M Take 20 mEq by mouth daily.   PRASTERONE (DHEA) PO Take 28 mg by mouth daily.   predniSONE  50 MG tablet Commonly known as: DELTASONE  Take 1 tablet 13 hours prior to your CT scan, take 1 tablet 7 hours prior to your CT scan, take 1 tablet 1 hour prior to your CT scan   PROBIOTIC DAILY PO Take 1 capsule by mouth daily.   rivaroxaban  20 MG Tabs tablet Commonly known as: XARELTO  Take 1 tablet (20 mg total) by mouth daily with supper.   thyroid  60 MG tablet Commonly known as: ARMOUR Take 60 mg by mouth daily before breakfast.   thyroid  15 MG  tablet Commonly known as: ARMOUR Take 15 mg by mouth. Only Monday-Friday   vitamin C 1000 MG tablet Take 3,000 mg by mouth daily.   VITAMIN D  PO Take 1 tablet by mouth daily.   zinc gluconate 50 MG tablet Take 50 mg by mouth daily.        Allergies:  Allergies  Allergen Reactions   Amoxicillin  Nausea And Vomiting   Codeine Nausea Only   Keflex [Cephalexin] Itching   Other Nausea Only and Other (See Comments)    Beef Chicken Dairy Eggs  Pt stated, My body can't GMO. Nausea and indegestion makes me sick   Albumin (Human)     Pt stated, I am not allergic to this and don't know what this means; 04/27/17   Eliquis  [Apixaban ] Other (See Comments)    Blood in stool,joint pain dizziness   Erythromycin Nausea Only and Other (See Comments)   Iodinated Contrast Media Other (See Comments)   Meperidine     Pt stated, I don't know what this is; 04/27/17   Metoclopramide  Hcl    Metoprolol      Tired fatigue itching and hdizziness   Percocet [Oxycodone -Acetaminophen ]     Made pt sick    Shellfish Allergy Other (See Comments)    Pt stated, I am not allergic to this anymore; 04/27/17   Shellfish-Derived Products    Oxycodone  Nausea And Vomiting    Pt stated, I do not know if I am allergic to this; 04/27/17    Past Medical History, Surgical history, Social history, and Family History were reviewed and updated.  Review of Systems: Review of Systems  Constitutional: Negative.   HENT: Negative.    Eyes: Negative.   Respiratory: Negative.    Cardiovascular:  Positive for palpitations.  Gastrointestinal:  Positive for blood in stool.  Genitourinary: Negative.   Musculoskeletal: Negative.   Skin: Negative.   Neurological: Negative.   Endo/Heme/Allergies:  Bruises/bleeds easily.  Psychiatric/Behavioral: Negative.       Physical Exam:  Vital signs show temperature of 99.  Pulse 82.  Blood pressure 140/68 weight is 148 pounds.  Wt Readings from Last 3 Encounters:   03/09/23 150 lb (68 kg)  03/05/23 154 lb 14.4 oz (70.3 kg)  02/16/23 153 lb 6.4 oz (69.6 kg)    Physical Exam Vitals reviewed.  HENT:     Head: Normocephalic and atraumatic.  Eyes:     Pupils: Pupils are equal, round, and reactive to light.  Cardiovascular:     Rate and Rhythm: Normal rate and regular rhythm.  Heart sounds: Normal heart sounds.     Comments: Cardiac exam is regular rate and rhythm.  She does have occasional erratic beats.  These are very limited.  She has no murmurs. Pulmonary:     Effort: Pulmonary effort is normal.     Breath sounds: Normal breath sounds.     Comments: Lungs sound clear to percussion and auscultation bilaterally.  She has good air movement bilaterally. Abdominal:     General: Bowel sounds are normal.     Palpations: Abdomen is soft.     Comments: Abdomen is soft.  She has good bowel sounds.  There is no guarding or rebound tenderness.  There is no obvious fluid wave.  There is no palpable liver or spleen tip.  Musculoskeletal:        General: No tenderness or deformity. Normal range of motion.     Cervical back: Normal range of motion.  Lymphadenopathy:     Cervical: No cervical adenopathy.  Skin:    General: Skin is warm and dry.     Findings: No erythema or rash.  Neurological:     Mental Status: She is alert and oriented to person, place, and time.  Psychiatric:        Behavior: Behavior normal.        Thought Content: Thought content normal.        Judgment: Judgment normal.     Lab Results  Component Value Date   WBC 6.9 05/08/2023   HGB 14.5 05/08/2023   HCT 42.6 05/08/2023   MCV 93.0 05/08/2023   PLT 197 05/08/2023   Lab Results  Component Value Date   FERRITIN 23 05/08/2023   IRON 145 05/08/2023   TIBC 311 05/08/2023   UIBC 166 05/08/2023   IRONPCTSAT 47 (H) 05/08/2023   Lab Results  Component Value Date   RETICCTPCT 1.2 05/08/2023   RBC 4.52 05/08/2023   RBC 4.58 05/08/2023   No results found for:  KPAFRELGTCHN, LAMBDASER, KAPLAMBRATIO No results found for: KIMBERLY LE, IGMSERUM No results found for: STEPHANY CARLOTA BENSON MARKEL EARLA JOANNIE DOC VICK, SPEI   Chemistry      Component Value Date/Time   NA 139 05/08/2023 1210   NA 141 09/04/2021 0844   NA 143 03/11/2017 1404   NA 139 07/02/2016 1139   K 3.9 05/08/2023 1210   K 3.9 03/11/2017 1404   K 4.1 07/02/2016 1139   CL 105 05/08/2023 1210   CL 105 03/11/2017 1404   CO2 26 05/08/2023 1210   CO2 31 03/11/2017 1404   CO2 26 07/02/2016 1139   BUN 19 05/08/2023 1210   BUN 15 09/04/2021 0844   BUN 15 03/11/2017 1404   BUN 20.3 07/02/2016 1139   CREATININE 0.64 05/08/2023 1210   CREATININE 0.73 06/23/2022 1141   CREATININE 0.8 07/02/2016 1139      Component Value Date/Time   CALCIUM 10.0 05/08/2023 1210   CALCIUM 9.4 03/11/2017 1404   CALCIUM 10.0 07/02/2016 1139   ALKPHOS 67 05/08/2023 1210   ALKPHOS 69 03/11/2017 1404   ALKPHOS 87 07/02/2016 1139   AST 26 05/08/2023 1210   AST 27 07/02/2016 1139   ALT 20 05/08/2023 1210   ALT 32 03/11/2017 1404   ALT 34 07/02/2016 1139   BILITOT 0.6 05/08/2023 1210   BILITOT 0.55 07/02/2016 1139       Impression and Plan: Lori [name] is a pleasant 78 yo caucasian female with hemochromatosis, heterozygous for the C282Y mutation.   We  will go ahead and do a phlebotomy on her today.  I think that she enjoys coming a little more often and being seen.  That she does well with getting IV fluids after she gets a phlebotomy.  I think this helps with respect to her blood pressure.  I will plan to see her back in another 6 weeks or so.    Maude JONELLE Crease, MD 1/3/202512:16 PM

## 2023-05-29 NOTE — Patient Instructions (Signed)

## 2023-05-29 NOTE — Patient Instructions (Signed)

## 2023-06-01 ENCOUNTER — Encounter: Payer: Self-pay | Admitting: *Deleted

## 2023-06-23 ENCOUNTER — Other Ambulatory Visit: Payer: Self-pay

## 2023-06-23 DIAGNOSIS — I480 Paroxysmal atrial fibrillation: Secondary | ICD-10-CM | POA: Diagnosis not present

## 2023-06-24 LAB — BASIC METABOLIC PANEL
BUN/Creatinine Ratio: 30 — ABNORMAL HIGH (ref 12–28)
BUN: 24 mg/dL (ref 8–27)
CO2: 24 mmol/L (ref 20–29)
Calcium: 10.2 mg/dL (ref 8.7–10.3)
Chloride: 99 mmol/L (ref 96–106)
Creatinine, Ser: 0.8 mg/dL (ref 0.57–1.00)
Glucose: 88 mg/dL (ref 70–99)
Potassium: 4.8 mmol/L (ref 3.5–5.2)
Sodium: 139 mmol/L (ref 134–144)
eGFR: 76 mL/min/{1.73_m2} (ref >59)

## 2023-06-24 LAB — CBC
Hematocrit: 42.5 % (ref 34.0–46.6)
Hemoglobin: 14.2 g/dL (ref 11.1–15.9)
MCH: 31.5 pg (ref 26.6–33.0)
MCHC: 33.4 g/dL (ref 31.5–35.7)
MCV: 94 fL (ref 79–97)
Platelets: 259 10*3/uL (ref 150–450)
RBC: 4.51 x10E6/uL (ref 3.77–5.28)
RDW: 12.5 % (ref 11.7–15.4)
WBC: 7.3 10*3/uL (ref 3.4–10.8)

## 2023-06-30 ENCOUNTER — Telehealth: Payer: Self-pay | Admitting: Cardiology

## 2023-06-30 DIAGNOSIS — S83281D Other tear of lateral meniscus, current injury, right knee, subsequent encounter: Secondary | ICD-10-CM | POA: Diagnosis not present

## 2023-06-30 DIAGNOSIS — M170 Bilateral primary osteoarthritis of knee: Secondary | ICD-10-CM | POA: Diagnosis not present

## 2023-06-30 NOTE — Telephone Encounter (Signed)
Caller Judeth Cornfield) stated patient is having a lot of pain in her knee and has been taking Advil every 4 hours.  Caller noted patient is scheduled for an ablation on 2/14 and wants to know if patient can have 4 mg Medrol dose pack starting tomorrow (2/5).

## 2023-07-01 ENCOUNTER — Encounter: Payer: Self-pay | Admitting: Cardiology

## 2023-07-01 NOTE — Telephone Encounter (Signed)
 See MyChart message

## 2023-07-04 ENCOUNTER — Encounter: Payer: Self-pay | Admitting: Cardiology

## 2023-07-06 ENCOUNTER — Other Ambulatory Visit: Payer: Self-pay

## 2023-07-06 DIAGNOSIS — I480 Paroxysmal atrial fibrillation: Secondary | ICD-10-CM

## 2023-07-21 DIAGNOSIS — M5416 Radiculopathy, lumbar region: Secondary | ICD-10-CM | POA: Diagnosis not present

## 2023-07-22 DIAGNOSIS — S83281D Other tear of lateral meniscus, current injury, right knee, subsequent encounter: Secondary | ICD-10-CM | POA: Diagnosis not present

## 2023-07-22 DIAGNOSIS — M1711 Unilateral primary osteoarthritis, right knee: Secondary | ICD-10-CM | POA: Diagnosis not present

## 2023-07-24 ENCOUNTER — Telehealth (HOSPITAL_COMMUNITY): Payer: Self-pay

## 2023-07-24 NOTE — Telephone Encounter (Signed)
 Attempted to reach patient to discuss upcoming procedure, no answer. Left VM for patient to return call.

## 2023-07-27 NOTE — Telephone Encounter (Addendum)
Left VM message for patient to return call

## 2023-07-28 DIAGNOSIS — M1711 Unilateral primary osteoarthritis, right knee: Secondary | ICD-10-CM | POA: Diagnosis not present

## 2023-08-03 NOTE — Addendum Note (Signed)
 Addended by: Primitivo Gauze on: 08/03/2023 10:52 AM   Modules accepted: Orders

## 2023-08-03 NOTE — Telephone Encounter (Signed)
 Patient returned missed calls. She is scheduled for an Atrial Fibrillation ablation on 08/21/23. She reports she has been having multiple health issues with back pain, requiring spinal injections and recovering from a torn meniscus of right knee and will have a repeat Gelsyn injection on 08/05/23. Patient reports she has not taken Xarelto since early February but instead taking Lumbrokinase enzyme. Advised patient that she would need to be on blood thinner for at least 3 weeks prior to ablation. She verbalized that she does not feel physically strong enough to proceed with ablation at this time and will contact office when ready to reschedule. Will notify EP scheduler and Dr. Lalla Brothers of patient's decision.

## 2023-08-04 ENCOUNTER — Telehealth: Payer: Self-pay

## 2023-08-04 NOTE — Telephone Encounter (Signed)
 Message saved to chart as an FYI.Marland KitchenMarland KitchenMarland Kitchen

## 2023-08-04 NOTE — Telephone Encounter (Signed)
-----   Message from Nurse Alger Simons B sent at 08/03/2023 10:39 AM EDT ----- Regarding: Afib Ablation 08/21/23- Update FYI- Patient cancelled her Afib ablation on 08/21/23. She will contact office when ready to reschedule.   Jp Eastham- EP calendar updated, EP lab and Lalla Brothers made aware.   Thanks,  Yahoo

## 2023-08-05 ENCOUNTER — Inpatient Hospital Stay: Attending: Hematology & Oncology

## 2023-08-05 ENCOUNTER — Encounter: Payer: Self-pay | Admitting: Medical Oncology

## 2023-08-05 ENCOUNTER — Inpatient Hospital Stay

## 2023-08-05 ENCOUNTER — Inpatient Hospital Stay (HOSPITAL_BASED_OUTPATIENT_CLINIC_OR_DEPARTMENT_OTHER): Admitting: Medical Oncology

## 2023-08-05 DIAGNOSIS — Z7901 Long term (current) use of anticoagulants: Secondary | ICD-10-CM | POA: Insufficient documentation

## 2023-08-05 DIAGNOSIS — I480 Paroxysmal atrial fibrillation: Secondary | ICD-10-CM | POA: Insufficient documentation

## 2023-08-05 DIAGNOSIS — K649 Unspecified hemorrhoids: Secondary | ICD-10-CM | POA: Diagnosis not present

## 2023-08-05 DIAGNOSIS — M1711 Unilateral primary osteoarthritis, right knee: Secondary | ICD-10-CM | POA: Diagnosis not present

## 2023-08-05 DIAGNOSIS — Z95828 Presence of other vascular implants and grafts: Secondary | ICD-10-CM

## 2023-08-05 LAB — CBC WITH DIFFERENTIAL (CANCER CENTER ONLY)
Abs Immature Granulocytes: 0.03 10*3/uL (ref 0.00–0.07)
Basophils Absolute: 0 10*3/uL (ref 0.0–0.1)
Basophils Relative: 1 %
Eosinophils Absolute: 0.1 10*3/uL (ref 0.0–0.5)
Eosinophils Relative: 1 %
HCT: 40.9 % (ref 36.0–46.0)
Hemoglobin: 13.8 g/dL (ref 12.0–15.0)
Immature Granulocytes: 0 %
Lymphocytes Relative: 28 %
Lymphs Abs: 2.1 10*3/uL (ref 0.7–4.0)
MCH: 32.4 pg (ref 26.0–34.0)
MCHC: 33.7 g/dL (ref 30.0–36.0)
MCV: 96 fL (ref 80.0–100.0)
Monocytes Absolute: 0.5 10*3/uL (ref 0.1–1.0)
Monocytes Relative: 7 %
Neutro Abs: 4.7 10*3/uL (ref 1.7–7.7)
Neutrophils Relative %: 63 %
Platelet Count: 206 10*3/uL (ref 150–400)
RBC: 4.26 MIL/uL (ref 3.87–5.11)
RDW: 12.7 % (ref 11.5–15.5)
WBC Count: 7.4 10*3/uL (ref 4.0–10.5)
nRBC: 0 % (ref 0.0–0.2)

## 2023-08-05 LAB — CMP (CANCER CENTER ONLY)
ALT: 23 U/L (ref 0–44)
AST: 24 U/L (ref 15–41)
Albumin: 4.3 g/dL (ref 3.5–5.0)
Alkaline Phosphatase: 78 U/L (ref 38–126)
Anion gap: 8 (ref 5–15)
BUN: 26 mg/dL — ABNORMAL HIGH (ref 8–23)
CO2: 27 mmol/L (ref 22–32)
Calcium: 9.5 mg/dL (ref 8.9–10.3)
Chloride: 104 mmol/L (ref 98–111)
Creatinine: 0.61 mg/dL (ref 0.44–1.00)
GFR, Estimated: >60 mL/min (ref >60)
Glucose, Bld: 186 mg/dL — ABNORMAL HIGH (ref 70–99)
Potassium: 3.6 mmol/L (ref 3.5–5.1)
Sodium: 139 mmol/L (ref 135–145)
Total Bilirubin: 0.5 mg/dL (ref 0.0–1.2)
Total Protein: 6.5 g/dL (ref 6.5–8.1)

## 2023-08-05 LAB — FERRITIN: Ferritin: 21 ng/mL (ref 11–307)

## 2023-08-05 MED ORDER — HEPARIN SOD (PORK) LOCK FLUSH 100 UNIT/ML IV SOLN
500.0000 [IU] | Freq: Once | INTRAVENOUS | Status: AC
  Administered 2023-08-05: 500 [IU] via INTRAVENOUS

## 2023-08-05 MED ORDER — SODIUM CHLORIDE 0.9% FLUSH
10.0000 mL | Freq: Once | INTRAVENOUS | Status: AC
  Administered 2023-08-05: 10 mL via INTRAVENOUS

## 2023-08-05 NOTE — Progress Notes (Signed)
 Hematology and Oncology Follow Up Visit  Lori [name] [id number] 11-23-1945 78 y.o. 08/05/2023   Principle Diagnosis:  Hemochromatosis - heterozygous for the C282Y mutation Paroxysmal A fib   Current Therapy:        Phlebotomy as indicated to keep ferritin < 35  (changed per patient request 06/24/17) or iron saturation less than 30% Xarelto 20 mg p.o. daily    Interim History:  Lori [name] is here today for follow-up.  She has not had her cardiac ablation. She has taken herself off of Xarelto which she had for a while. She she has elected to take homeopathic "lumbrokenase and Nattokenase" for blood thinner use. She would like to get her pain medication and get her sleep in better before she does this procedure.   She has chronic orange stools and GI discomfort. She believes that this is blood in her stool. She does have hemorrhoids. She has had a Endoscopy/colonoscopy in 09/24.   She took her first dose of gabapentin last night which helped her sleep and help her with chronic knee pain and back pain. Her chronic pain is a significant concern for her. She is excited to see if the gabapentin is the answer to her pain.   She is doing well with her hemochromatosis in general. Her last set of iron studies in January included a ferritin of 19 and iron saturation of 49%. Her last phlebotomy was on 05/29/2023.   She is on Plaquenil.  She is on thyroid medication.  She is doing well with her blood pressure medication.  No fevers, SOB, chest pain, rash, abdominal pain. Fatigue is chronic. Headaches are infrequent.   Overall, I would have to say that her performance status is probably ECOG 1.    Wt Readings from Last 3 Encounters:  08/05/23 148 lb 9.6 oz (67.4 kg)  05/29/23 148 lb (67.1 kg)  03/09/23 150 lb (68 kg)   Medications:  Allergies as of 08/05/2023       Reactions   Amoxicillin Nausea And Vomiting   Codeine Nausea Only   Keflex [cephalexin] Itching   Other Nausea  Only, Other (See Comments)   Beef Chicken Dairy Eggs Pt stated, "My body can't GMO. Nausea and indegestion makes me sick"   Eliquis [apixaban] Other (See Comments)   Blood in stool,joint pain dizziness   Erythromycin Nausea Only, Other (See Comments)   Iodinated Contrast Media Other (See Comments)   Meperidine    Pt stated, "I don't know what this is"; 04/27/17   Metoclopramide Hcl    Metoprolol    Tired fatigue itching and hdizziness   Percocet [oxycodone-acetaminophen]    Made pt sick    Oxycodone Nausea And Vomiting   Pt stated, "I do not know if I am allergic to this"; 04/27/17        Medication List        Accurate as of August 05, 2023  2:48 PM. If you have any questions, ask your nurse or doctor.          STOP taking these medications    diphenhydrAMINE 50 MG tablet Commonly known as: BENADRYL Stopped by: Rushie Chestnut   meloxicam 15 MG tablet Commonly known as: MOBIC Stopped by: Rushie Chestnut   OVER THE COUNTER MEDICATION Stopped by: Rushie Chestnut   OVER THE COUNTER MEDICATION Stopped by: Rushie Chestnut   predniSONE 50 MG tablet Commonly known as: DELTASONE Stopped by: Rushie Chestnut   rivaroxaban 20 MG  Tabs tablet Commonly known as: XARELTO Stopped by: Rushie Chestnut       TAKE these medications    amiloride-hydrochlorothiazide 5-50 MG tablet Commonly known as: MODURETIC Take 0.5 tablets by mouth daily. As Needed   dextroamphetamine 5 MG tablet Commonly known as: DEXTROSTAT 5 mg daily.   fish oil-omega-3 fatty acids 1000 MG capsule Take 1 g by mouth daily.   hydroxychloroquine 200 MG tablet Commonly known as: PLAQUENIL Take 200 mg by mouth daily.   melatonin 5 MG Tabs Take 5 mg by mouth at bedtime.   MULTIVITAMIN PO Take by mouth.   OVER THE COUNTER MEDICATION daily. Methylized Vitamin B  Complex.   potassium chloride SA 20 MEQ tablet Commonly known as: KLOR-CON M Take 20 mEq by mouth daily.    PRASTERONE (DHEA) PO Take 28 mg by mouth daily.   PROBIOTIC DAILY PO Take 1 capsule by mouth daily.   thyroid 60 MG tablet Commonly known as: ARMOUR Take 60 mg by mouth daily before breakfast.   thyroid 15 MG tablet Commonly known as: ARMOUR Take 15 mg by mouth. Only Monday-Friday   vitamin C 1000 MG tablet Take 3,000 mg by mouth daily.   VITAMIN D PO Take 1 tablet by mouth daily.   zinc gluconate 50 MG tablet Take 50 mg by mouth daily.        Allergies:  Allergies  Allergen Reactions   Amoxicillin Nausea And Vomiting   Codeine Nausea Only   Keflex [Cephalexin] Itching   Other Nausea Only and Other (See Comments)    Beef Chicken Dairy Eggs  Pt stated, "My body can't GMO. Nausea and indegestion makes me sick"   Eliquis [Apixaban] Other (See Comments)    Blood in stool,joint pain dizziness   Erythromycin Nausea Only and Other (See Comments)   Iodinated Contrast Media Other (See Comments)   Meperidine     Pt stated, "I don't know what this is"; 04/27/17   Metoclopramide Hcl    Metoprolol     Tired fatigue itching and hdizziness   Percocet [Oxycodone-Acetaminophen]     Made pt sick    Oxycodone Nausea And Vomiting    Pt stated, "I do not know if I am allergic to this"; 04/27/17    Past Medical History, Surgical history, Social history, and Family History were reviewed and updated.  Review of Systems: Review of Systems  Constitutional: Negative.   HENT: Negative.    Eyes: Negative.   Respiratory: Negative.    Cardiovascular:  Positive for palpitations.  Gastrointestinal:  Positive for blood in stool.  Genitourinary: Negative.   Musculoskeletal: Negative.   Skin: Negative.   Neurological: Negative.   Endo/Heme/Allergies:  Bruises/bleeds easily.  Psychiatric/Behavioral: Negative.     Physical Exam: Vitals:   08/05/23 1405  BP: (!) 104/56  Pulse: 84  Resp: 16  Temp: 97.9 F (36.6 C)  SpO2: 100%     Wt Readings from Last 3 Encounters:   08/05/23 148 lb 9.6 oz (67.4 kg)  05/29/23 148 lb (67.1 kg)  03/09/23 150 lb (68 kg)    Physical Exam Vitals reviewed.  HENT:     Head: Normocephalic and atraumatic.  Eyes:     Pupils: Pupils are equal, round, and reactive to light.  Cardiovascular:     Rate and Rhythm: Normal rate. Rhythm irregular.     Heart sounds: Normal heart sounds.     Comments: Cardiac exam is regular rate and rhythm.  She does have occasional erratic beats.  These are very limited.  She has no murmurs. Pulmonary:     Effort: Pulmonary effort is normal.     Breath sounds: Normal breath sounds.     Comments: Lungs sound clear to percussion and auscultation bilaterally.  She has good air movement bilaterally. Abdominal:     General: Bowel sounds are normal.     Palpations: Abdomen is soft.     Comments: Abdomen is soft.  She has good bowel sounds.  There is no guarding or rebound tenderness.  There is no obvious fluid wave.  There is no palpable liver or spleen tip.  Musculoskeletal:        General: No tenderness or deformity. Normal range of motion.     Cervical back: Normal range of motion.  Lymphadenopathy:     Cervical: No cervical adenopathy.  Skin:    General: Skin is warm and dry.     Findings: No erythema or rash.  Neurological:     Mental Status: She is alert and oriented to person, place, and time.  Psychiatric:        Behavior: Behavior normal.        Thought Content: Thought content normal.        Judgment: Judgment normal.    Lab Results  Component Value Date   WBC 7.4 08/05/2023   HGB 13.8 08/05/2023   HCT 40.9 08/05/2023   MCV 96.0 08/05/2023   PLT 206 08/05/2023   Lab Results  Component Value Date   FERRITIN 19 05/29/2023   IRON 148 05/29/2023   TIBC 304 05/29/2023   UIBC 156 05/29/2023   IRONPCTSAT 49 (H) 05/29/2023   Lab Results  Component Value Date   RETICCTPCT 1.7 05/29/2023   RBC 4.26 08/05/2023   No results found for: "KPAFRELGTCHN", "LAMBDASER",  "KAPLAMBRATIO" No results found for: "IGGSERUM", "IGA", "IGMSERUM" No results found for: "TOTALPROTELP", "ALBUMINELP", "A1GS", "A2GS", "BETS", "BETA2SER", "GAMS", "MSPIKE", "SPEI"   Chemistry      Component Value Date/Time   NA 139 08/05/2023 1340   NA 139 06/23/2023 1346   NA 143 03/11/2017 1404   NA 139 07/02/2016 1139   K 3.6 08/05/2023 1340   K 3.9 03/11/2017 1404   K 4.1 07/02/2016 1139   CL 104 08/05/2023 1340   CL 105 03/11/2017 1404   CO2 27 08/05/2023 1340   CO2 31 03/11/2017 1404   CO2 26 07/02/2016 1139   BUN 26 (H) 08/05/2023 1340   BUN 24 06/23/2023 1346   BUN 15 03/11/2017 1404   BUN 20.3 07/02/2016 1139   CREATININE 0.61 08/05/2023 1340   CREATININE 0.73 06/23/2022 1141   CREATININE 0.8 07/02/2016 1139      Component Value Date/Time   CALCIUM 9.5 08/05/2023 1340   CALCIUM 9.4 03/11/2017 1404   CALCIUM 10.0 07/02/2016 1139   ALKPHOS 78 08/05/2023 1340   ALKPHOS 69 03/11/2017 1404   ALKPHOS 87 07/02/2016 1139   AST 24 08/05/2023 1340   AST 27 07/02/2016 1139   ALT 23 08/05/2023 1340   ALT 32 03/11/2017 1404   ALT 34 07/02/2016 1139   BILITOT 0.5 08/05/2023 1340   BILITOT 0.55 07/02/2016 1139     Encounter Diagnosis  Name Primary?   Hereditary hemochromatosis (HCC) Yes    Impression and Plan: Ms. Kott is a pleasant 78 yo caucasian female with hemochromatosis, heterozygous for the C282Y mutation. She also has many other medical conditions for which she is followed by specialists. She has a very strong desire to  do more homeopathic remedies.   Today her CBC is normal and her CMP shows that she just ate and could use some hydration.  She reports being asymptomatic in terms of her hemochromatosis and her ferritin is well within target range. She has somewhere to be in about an hour and elects to hold her phlebotomy today.   No phlebotomy today  RTC 1 month [place] or [place] per patient request, port labs (CBC, CMP, iron, ferritin), Phlebotomy with 1L  replacement fluids.     Rushie Chestnut, PA-C 3/12/20252:48 PM

## 2023-08-06 ENCOUNTER — Encounter: Payer: Self-pay | Admitting: *Deleted

## 2023-08-06 LAB — IRON AND IRON BINDING CAPACITY (CC-WL,HP ONLY)
Iron: 87 ug/dL (ref 28–170)
Saturation Ratios: 30 % (ref 10.4–31.8)
TIBC: 295 ug/dL (ref 250–450)
UIBC: 208 ug/dL (ref 148–442)

## 2023-08-07 ENCOUNTER — Ambulatory Visit (HOSPITAL_COMMUNITY): Payer: PPO | Admitting: Physician Assistant

## 2023-08-21 ENCOUNTER — Encounter (HOSPITAL_COMMUNITY): Admission: RE | Payer: PPO | Source: Home / Self Care

## 2023-08-21 ENCOUNTER — Ambulatory Visit (HOSPITAL_COMMUNITY): Admission: RE | Admit: 2023-08-21 | Payer: PPO | Source: Home / Self Care | Admitting: Cardiology

## 2023-08-21 SURGERY — ATRIAL FIBRILLATION ABLATION
Anesthesia: General

## 2023-09-02 ENCOUNTER — Inpatient Hospital Stay

## 2023-09-02 ENCOUNTER — Inpatient Hospital Stay (HOSPITAL_BASED_OUTPATIENT_CLINIC_OR_DEPARTMENT_OTHER): Admitting: Family

## 2023-09-02 ENCOUNTER — Inpatient Hospital Stay: Attending: Hematology & Oncology

## 2023-09-02 ENCOUNTER — Encounter: Payer: Self-pay | Admitting: Hematology & Oncology

## 2023-09-02 ENCOUNTER — Telehealth: Payer: Self-pay | Admitting: *Deleted

## 2023-09-02 ENCOUNTER — Encounter: Payer: Self-pay | Admitting: Family

## 2023-09-02 DIAGNOSIS — Z7982 Long term (current) use of aspirin: Secondary | ICD-10-CM | POA: Insufficient documentation

## 2023-09-02 DIAGNOSIS — I480 Paroxysmal atrial fibrillation: Secondary | ICD-10-CM | POA: Insufficient documentation

## 2023-09-02 LAB — IRON AND IRON BINDING CAPACITY (CC-WL,HP ONLY)
Iron: 97 ug/dL (ref 28–170)
Saturation Ratios: 37 % — ABNORMAL HIGH (ref 10.4–31.8)
TIBC: 266 ug/dL (ref 250–450)
UIBC: 169 ug/dL (ref 148–442)

## 2023-09-02 LAB — CBC
HCT: 39.6 % (ref 36.0–46.0)
Hemoglobin: 13.3 g/dL (ref 12.0–15.0)
MCH: 32.3 pg (ref 26.0–34.0)
MCHC: 33.6 g/dL (ref 30.0–36.0)
MCV: 96.1 fL (ref 80.0–100.0)
Platelets: 192 10*3/uL (ref 150–400)
RBC: 4.12 MIL/uL (ref 3.87–5.11)
RDW: 12.7 % (ref 11.5–15.5)
WBC: 5.3 10*3/uL (ref 4.0–10.5)
nRBC: 0 % (ref 0.0–0.2)

## 2023-09-02 LAB — CMP (CANCER CENTER ONLY)
ALT: 29 U/L (ref 0–44)
AST: 28 U/L (ref 15–41)
Albumin: 4.2 g/dL (ref 3.5–5.0)
Alkaline Phosphatase: 72 U/L (ref 38–126)
Anion gap: 7 (ref 5–15)
BUN: 21 mg/dL (ref 8–23)
CO2: 29 mmol/L (ref 22–32)
Calcium: 9.7 mg/dL (ref 8.9–10.3)
Chloride: 105 mmol/L (ref 98–111)
Creatinine: 0.66 mg/dL (ref 0.44–1.00)
GFR, Estimated: >60 mL/min (ref >60)
Glucose, Bld: 114 mg/dL — ABNORMAL HIGH (ref 70–99)
Potassium: 3.6 mmol/L (ref 3.5–5.1)
Sodium: 141 mmol/L (ref 135–145)
Total Bilirubin: 0.4 mg/dL (ref 0.0–1.2)
Total Protein: 6.3 g/dL — ABNORMAL LOW (ref 6.5–8.1)

## 2023-09-02 LAB — FERRITIN: Ferritin: 18 ng/mL (ref 11–307)

## 2023-09-02 MED ORDER — HEPARIN SOD (PORK) LOCK FLUSH 100 UNIT/ML IV SOLN
500.0000 [IU] | Freq: Once | INTRAVENOUS | Status: DC
  Administered 2023-09-02: 500 [IU] via INTRAVENOUS

## 2023-09-02 MED ORDER — SODIUM CHLORIDE 0.9% FLUSH
10.0000 mL | Freq: Once | INTRAVENOUS | Status: AC
  Administered 2023-09-02: 10 mL via INTRAVENOUS

## 2023-09-02 MED ORDER — SODIUM CHLORIDE 0.9% FLUSH
10.0000 mL | INTRAVENOUS | Status: DC | PRN
  Administered 2023-09-02: 10 mL via INTRAVENOUS

## 2023-09-02 NOTE — Progress Notes (Signed)
 Hematology and Oncology Follow Up Visit  Lori [name] [id number] 02-06-1946 78 y.o. 09/02/2023   Principle Diagnosis:  Hemochromatosis - heterozygous for the C282Y mutation Paroxysmal A fib   Current Therapy:        Phlebotomy as indicated to keep ferritin < 35  (changed per patient request 06/24/17) or iron saturation less than 30% Xarelto 20 mg PO daily patient stopped and is currently on aspirin and Advil BID   Interim History:  Lori [name] is here today for follow-up. She is symptomatic with brain fog as well as SOB at night.  She states that she also has headaches at night when her iron counts are on the high side.  Iron saturation at last visit was 30% and ferritin 21.  No fever, chills, n/v, cough, chest pain, palpitations, abdominal pain or changes in bowel or bladder habits at this time.  She has chronic right knee pain and mild swelling due to a torn meniscus that occurred while moving homes. She has received gel injections but states that she will likely need surgery to repair.  No falls or syncope.  Appetite and hydration are good.  ECOG Performance Status: 1 - Symptomatic but completely ambulatory  Medications:  Allergies as of 09/02/2023       Reactions   Amoxicillin Nausea And Vomiting   Codeine Nausea Only   Keflex [cephalexin] Itching   Eliquis [apixaban] Other (See Comments)   Blood in stool,joint pain dizziness   Erythromycin Nausea Only, Other (See Comments)   Iodinated Contrast Media Other (See Comments)   Meperidine    Pt stated, "I don't know what this is"; 04/27/17   Metoclopramide Hcl    Metoprolol    Tired fatigue itching and hdizziness   Percocet [oxycodone-acetaminophen]    Made pt sick    Oxycodone Nausea And Vomiting   Pt stated, "I do not know if I am allergic to this"; 04/27/17        Medication List        Accurate as of September 02, 2023 11:44 AM. If you have any questions, ask your nurse or doctor.           amiloride-hydrochlorothiazide 5-50 MG tablet Commonly known as: MODURETIC Take 0.5 tablets by mouth daily. As Needed   b complex vitamins capsule Take 1 capsule by mouth daily.   dextroamphetamine 5 MG tablet Commonly known as: DEXTROSTAT 5 mg daily.   fish oil-omega-3 fatty acids 1000 MG capsule Take 1 g by mouth daily.   hydroxychloroquine 200 MG tablet Commonly known as: PLAQUENIL Take 200 mg by mouth daily.   l-methylfolate-B6-B12 3-35-2 MG Tabs tablet Commonly known as: METANX Take 1 tablet by mouth daily.   Lyrica 75 MG capsule Generic drug: pregabalin Take 75 mg by mouth daily. @ bedtime   melatonin 5 MG Tabs Take 5 mg by mouth at bedtime.   MULTIVITAMIN PO Take by mouth.   NON FORMULARY Methylcobalamin daily   OVER THE COUNTER MEDICATION daily. Methylized Vitamin B  Complex.   potassium chloride SA 20 MEQ tablet Commonly known as: KLOR-CON M Take 20 mEq by mouth daily.   PRASTERONE (DHEA) PO Take 28 mg by mouth daily.   PROBIOTIC DAILY PO Take 1 capsule by mouth daily.   thyroid 60 MG tablet Commonly known as: ARMOUR Take 60 mg by mouth daily before breakfast.   thyroid 15 MG tablet Commonly known as: ARMOUR Take 15 mg by mouth. Only Monday-Friday   vitamin C 1000 MG  tablet Take 3,000 mg by mouth daily.   VITAMIN D PO Take 1 tablet by mouth daily.   zinc gluconate 50 MG tablet Take 50 mg by mouth daily.        Allergies:  Allergies  Allergen Reactions   Amoxicillin Nausea And Vomiting   Codeine Nausea Only   Keflex [Cephalexin] Itching   Eliquis [Apixaban] Other (See Comments)    Blood in stool,joint pain dizziness   Erythromycin Nausea Only and Other (See Comments)   Iodinated Contrast Media Other (See Comments)   Meperidine     Pt stated, "I don't know what this is"; 04/27/17   Metoclopramide Hcl    Metoprolol     Tired fatigue itching and hdizziness   Percocet [Oxycodone-Acetaminophen]     Made pt sick    Oxycodone  Nausea And Vomiting    Pt stated, "I do not know if I am allergic to this"; 04/27/17    Past Medical History, Surgical history, Social history, and Family History were reviewed and updated.  Review of Systems: All other 10 point review of systems is negative.   Physical Exam:  oral temperature is 98 F (36.7 C). Her blood pressure is 110/61 and her pulse is 77. Her respiration is 20 and oxygen saturation is 100%.   Wt Readings from Last 3 Encounters:  08/05/23 148 lb 9.6 oz (67.4 kg)  05/29/23 148 lb (67.1 kg)  03/09/23 150 lb (68 kg)    Ocular: Sclerae unicteric, pupils equal, round and reactive to light Ear-nose-throat: Oropharynx clear, dentition fair Lymphatic: No cervical or supraclavicular adenopathy Lungs no rales or rhonchi, good excursion bilaterally Heart regular rate and rhythm, no murmur appreciated Abd soft, nontender, positive bowel sounds MSK no focal spinal tenderness, no joint edema Neuro: non-focal, well-oriented, appropriate affect Breasts: Deferred   Lab Results  Component Value Date   WBC 5.3 09/02/2023   HGB 13.3 09/02/2023   HCT 39.6 09/02/2023   MCV 96.1 09/02/2023   PLT 192 09/02/2023   Lab Results  Component Value Date   FERRITIN 21 08/05/2023   IRON 87 08/05/2023   TIBC 295 08/05/2023   UIBC 208 08/05/2023   IRONPCTSAT 30 08/05/2023   Lab Results  Component Value Date   RETICCTPCT 1.7 05/29/2023   RBC 4.12 09/02/2023   No results found for: "KPAFRELGTCHN", "LAMBDASER", "KAPLAMBRATIO" No results found for: "IGGSERUM", "IGA", "IGMSERUM" No results found for: "TOTALPROTELP", "ALBUMINELP", "A1GS", "A2GS", "BETS", "BETA2SER", "GAMS", "MSPIKE", "SPEI"   Chemistry      Component Value Date/Time   NA 141 09/02/2023 1025   NA 139 06/23/2023 1346   NA 143 03/11/2017 1404   NA 139 07/02/2016 1139   K 3.6 09/02/2023 1025   K 3.9 03/11/2017 1404   K 4.1 07/02/2016 1139   CL 105 09/02/2023 1025   CL 105 03/11/2017 1404   CO2 29 09/02/2023  1025   CO2 31 03/11/2017 1404   CO2 26 07/02/2016 1139   BUN 21 09/02/2023 1025   BUN 24 06/23/2023 1346   BUN 15 03/11/2017 1404   BUN 20.3 07/02/2016 1139   CREATININE 0.66 09/02/2023 1025   CREATININE 0.73 06/23/2022 1141   CREATININE 0.8 07/02/2016 1139      Component Value Date/Time   CALCIUM 9.7 09/02/2023 1025   CALCIUM 9.4 03/11/2017 1404   CALCIUM 10.0 07/02/2016 1139   ALKPHOS 72 09/02/2023 1025   ALKPHOS 69 03/11/2017 1404   ALKPHOS 87 07/02/2016 1139   AST 28 09/02/2023 1025  AST 27 07/02/2016 1139   ALT 29 09/02/2023 1025   ALT 32 03/11/2017 1404   ALT 34 07/02/2016 1139   BILITOT 0.4 09/02/2023 1025   BILITOT 0.55 07/02/2016 1139       Impression and Plan: Ms. Boza is a pleasant 78 yo caucasian female with hemochromatosis, heterozygous for the C282Y mutation.   We will proceed with phlebotomy today per patient preference and for current symptoms.  Iron studies are pending.  Follow-up in 8 weeks.   Eileen Stanford, NP 4/9/202511:44 AM

## 2023-09-02 NOTE — Patient Instructions (Signed)

## 2023-09-02 NOTE — Progress Notes (Signed)
 Lori [name] presents today for partial phlebotomy per Eileen Stanford NP, orders. Partial Phlebotomy procedure started at 1152 and ended at 1205 and only 230 grams removed as port stopped giving blood. Port flushed with 60 cc of normal saline. Pt denies any complaints or pain. Normal saline infusion started to port and will resume phlebotomy to complete last 30 cc of blood. Phlebotomy procedure started at 1215 and ended at 1215 and 30 cc of blood removed via 19 gauge implanted port. Patient observed for 30 minutes after procedure without any incident. Patient tolerated procedure well. IV needle removed intact.

## 2023-09-02 NOTE — Telephone Encounter (Signed)
 Patient called and wanted to speak to a nurse about her schedule. She only wants to come in and do labs. She does not want to see a provider or schedule a phlebotomy until she has lab results. She does not want to pay her copay if not needed. She will call the office if she has any concerns or questions.

## 2023-09-04 ENCOUNTER — Encounter: Payer: Self-pay | Admitting: Cardiology

## 2023-09-10 DIAGNOSIS — M25561 Pain in right knee: Secondary | ICD-10-CM | POA: Diagnosis not present

## 2023-09-10 DIAGNOSIS — M1711 Unilateral primary osteoarthritis, right knee: Secondary | ICD-10-CM | POA: Diagnosis not present

## 2023-09-22 ENCOUNTER — Telehealth: Payer: Self-pay | Admitting: Cardiology

## 2023-09-22 NOTE — Telephone Encounter (Signed)
 PT ID X [id number]- Informed her that there is not a later time available on 10/12/23 for her to come to  Informed her that I would send these other questions to her provider and also the pharmacist.

## 2023-09-22 NOTE — Telephone Encounter (Signed)
 Pt would like a c/b regarding appt on 5/19. She would like to know if she is able to come later that day if at all possible. Pt also wants to know is she has to wait 3 months after having Ablation before having Knee Replacement and suggestions for pain medication as well. Please advise

## 2023-09-23 ENCOUNTER — Telehealth (HOSPITAL_BASED_OUTPATIENT_CLINIC_OR_DEPARTMENT_OTHER): Payer: Self-pay | Admitting: *Deleted

## 2023-09-23 NOTE — Telephone Encounter (Signed)
 Left message for patient to call back

## 2023-09-23 NOTE — Telephone Encounter (Signed)
   Pre-operative Risk Assessment    Patient Name: Lori [name]  DOB: [birth year] MRN: [id number]   Date of last office visit: 03/09/2023 Date of next office visit: 10/12/2023, PRE OP added to upcoming appt notes.   Request for Surgical Clearance    Procedure:   Right total knee arthroplasty  Date of Surgery:  Clearance TBD                                 Surgeon:  Dr.Frank Aluisio Surgeon's Group or Practice Name:  [facility] Phone number:  [phone] Fax number:  [phone]   Type of Clearance Requested:   - Medical    Type of Anesthesia:  Not Indicated   Additional requests/questions:  How Long to wait after ablation?? Per surgeons office.   Signed, Lauris Port   09/23/2023, 4:57 PM

## 2023-09-25 ENCOUNTER — Inpatient Hospital Stay: Attending: Hematology & Oncology

## 2023-09-25 LAB — CMP (CANCER CENTER ONLY)
ALT: 20 U/L (ref 0–44)
AST: 22 U/L (ref 15–41)
Albumin: 4.4 g/dL (ref 3.5–5.0)
Alkaline Phosphatase: 73 U/L (ref 38–126)
Anion gap: 6 (ref 5–15)
BUN: 21 mg/dL (ref 8–23)
CO2: 30 mmol/L (ref 22–32)
Calcium: 9.8 mg/dL (ref 8.9–10.3)
Chloride: 106 mmol/L (ref 98–111)
Creatinine: 0.72 mg/dL (ref 0.44–1.00)
GFR, Estimated: >60 mL/min (ref >60)
Glucose, Bld: 110 mg/dL — ABNORMAL HIGH (ref 70–99)
Potassium: 4.3 mmol/L (ref 3.5–5.1)
Sodium: 142 mmol/L (ref 135–145)
Total Bilirubin: 0.3 mg/dL (ref 0.0–1.2)
Total Protein: 6.9 g/dL (ref 6.5–8.1)

## 2023-09-25 LAB — IRON AND IRON BINDING CAPACITY (CC-WL,HP ONLY)
Iron: 40 ug/dL (ref 28–170)
Saturation Ratios: 13 % (ref 10.4–31.8)
TIBC: 301 ug/dL (ref 250–450)
UIBC: 261 ug/dL (ref 148–442)

## 2023-09-25 LAB — CBC WITH DIFFERENTIAL (CANCER CENTER ONLY)
Abs Immature Granulocytes: 0.04 10*3/uL (ref 0.00–0.07)
Basophils Absolute: 0.1 10*3/uL (ref 0.0–0.1)
Basophils Relative: 1 %
Eosinophils Absolute: 0.1 10*3/uL (ref 0.0–0.5)
Eosinophils Relative: 2 %
HCT: 39.8 % (ref 36.0–46.0)
Hemoglobin: 13.1 g/dL (ref 12.0–15.0)
Immature Granulocytes: 1 %
Lymphocytes Relative: 34 %
Lymphs Abs: 2 10*3/uL (ref 0.7–4.0)
MCH: 32 pg (ref 26.0–34.0)
MCHC: 32.9 g/dL (ref 30.0–36.0)
MCV: 97.1 fL (ref 80.0–100.0)
Monocytes Absolute: 0.7 10*3/uL (ref 0.1–1.0)
Monocytes Relative: 12 %
Neutro Abs: 3.1 10*3/uL (ref 1.7–7.7)
Neutrophils Relative %: 50 %
Platelet Count: 246 10*3/uL (ref 150–400)
RBC: 4.1 MIL/uL (ref 3.87–5.11)
RDW: 12.6 % (ref 11.5–15.5)
WBC Count: 6 10*3/uL (ref 4.0–10.5)
nRBC: 0 % (ref 0.0–0.2)

## 2023-09-25 LAB — FERRITIN: Ferritin: 28 ng/mL (ref 11–307)

## 2023-09-25 NOTE — Telephone Encounter (Signed)
   Name: Lori [name]  DOB: Nov 17, 1945  MRN: [id number]  Primary Cardiologist: Magnus Schuller, MD  Chart reviewed as part of pre-operative protocol coverage. The patient has an upcoming visit scheduled with Creighton Doffing, NP on 10/12/2023 at which time clearance can be addressed in case there are any issues that would impact surgical recommendations.  Right total knee arthroplasty is not scheduled until TBD as below. I added preop FYI to appointment note so that provider is aware to address at time of outpatient visit.  Per office protocol the cardiology provider should forward their finalized clearance decision and recommendations regarding antiplatelet therapy to the requesting party below.    Post ablation, we must have uninterrupted anticoagulation for 3 months.   I will route this message as FYI to requesting party and remove this message from the preop box as separate preop APP input not needed at this time.   Please call with any questions.  Ava Boatman, NP  09/25/2023, 1:47 PM

## 2023-09-30 ENCOUNTER — Other Ambulatory Visit

## 2023-10-07 ENCOUNTER — Ambulatory Visit: Payer: PPO | Admitting: Student

## 2023-10-09 NOTE — Progress Notes (Signed)
 Electrophysiology Office Note:   Date:  10/12/2023  ID:  Lori [name], DOB [birth year], MRN [id number]  Primary Cardiologist: Lori Schuller, MD Primary Heart Failure: None Electrophysiologist: Lori Byes, MD      History of Present Illness:   Lori [name] is a 78 y.o. female with h/o AF, HTN, MCTD, hemochromatosis with periodic phlebotomy seen today for routine electrophysiology followup.   Since last being seen in our clinic the patient reports she is concerned about her right knee and need for knee surgery. Her husband accompanies her today and he asks about his appts with his cardiologist.  She states she has had LE swelling while on NSAIDs. She notes she must have them for her knee pain.  She states she wants to check labs to make sure she is not in kidney failure and an make sure her heart is ok. She is concerned about her ablation and taking blood thinners with her knee pain / NSAID use.  She is aware she is not a candidate for Watchman due to her LAA anatomy.   She denies chest pain, palpitations, dyspnea, PND, orthopnea, nausea, vomiting, dizziness, syncope, edema, weight gain, or early satiety.   Review of systems complete and found to be negative unless listed in HPI.   EP Information / Studies Reviewed:    EKG is ordered today. Personal review as below.  EKG Interpretation Date/Time:  Monday Oct 12 2023 27:25:36 EDT Ventricular Rate:  89 PR Interval:  208 QRS Duration:  78 QT Interval:  368 QTC Calculation: 447 R Axis:   34  Text Interpretation: Sinus rhythm with Premature supraventricular complexes Confirmed by Lori [name] ([id number]) on 10/12/2023 8:38:56 AM   Studies:  ECHO 09/04/21 > LVEF 60%, RV normal, mildly dilated LA, trivial MR  CT Cardiac Morphology 05/26/23 > normal PV drainage, no LAA thrombus, LA appendage is a small chicken wing appendage that is bilobed, shallow depth between both lobes & both lobes cannot be covered with a Watchman FLX  LAAC, small PFO, CAC score of 0   Arrhythmia / AAD AF > initial dx ~ 07/2021 by Zio    Risk Assessment/Calculations:    CHA2DS2-VASc Score = 3   This indicates a 3.2% annual risk of stroke. The patient's score is based upon: CHF History: 0 HTN History: 0 Diabetes History: 0 Stroke History: 0 Vascular Disease History: 0 Age Score: 2 Gender Score: 1             Physical Exam:   VS:  BP 135/76   Pulse 89   Ht 5\' 7"  (1.702 m)   Wt 158 lb (71.7 kg)   SpO2 90%   BMI 24.75 kg/m    Wt Readings from Last 3 Encounters:  10/12/23 158 lb (71.7 kg)  08/05/23 148 lb 9.6 oz (67.4 kg)  05/29/23 148 lb (67.1 kg)     GEN: Well nourished, well developed in no acute distress NECK: No JVD; No carotid bruits CARDIAC: Regular rate and rhythm, no murmurs, rubs, gallops RESPIRATORY:  Clear to auscultation without rales, wheezing or rhonchi  ABDOMEN: Soft, non-tender, non-distended EXTREMITIES:  Trace edema; No deformity   ASSESSMENT AND PLAN:    Paroxysmal Atrial Fibrillation  Atrial Flutter  CHA2DS2-VASc 3, hx of GIB -reviewed with Dr. Marven [name] regarding knee surgery, ok to move forward with knee surgery and we will delay her PVI ablation until after to avoid interruption of her OAC  -EKG with NSR -called patient after visit > instructed her  to NOT start Xarelto , she indicates understanding  -will plan for f/u in 3 months to review patient status & candidacy for ablation post knee replacement (planned for July 7th, 2025 with Dr. Rossie [name])  -not a candidate for Watchman due to LAA depth/bilobed   Hemochromatosis  Follows with Dr. Maria [name]  -intermittent phlebotomy per Heme/ONC   LE Swelling  Trace to 1+ on exam, largely in feet  -pt cautioned not to be on NSAIDS with any blood thinner  -suspect swelling is related to her NSAID use -assess BMP / ECHO at pt's request, do not need to delay surgery for results based on RCRI below    Pre-Procedure Clearance  Click Here to Calculate  RCRI      :[id number]}   Lori [name]'s perioperative risk of a major cardiac event is 0.4% according to the Revised Cardiac Risk Index (RCRI).  Therefore, she is at low risk for perioperative complications.     Recommendations: According to ACC/AHA guidelines, no further cardiovascular testing needed.  The patient may proceed to surgery at acceptable risk.      Follow up with Dr. Marven [name] in 3 months to review candidacy for ablation at that time   Signed, Lori Doffing, NP-C, AGACNP-BC [facility] - Electrophysiology  10/12/2023, 1:01 PM

## 2023-10-12 ENCOUNTER — Encounter: Payer: Self-pay | Admitting: Pulmonary Disease

## 2023-10-12 ENCOUNTER — Ambulatory Visit: Attending: Pulmonary Disease | Admitting: Pulmonary Disease

## 2023-10-12 VITALS — BP 135/76 | HR 89 | Ht 67.0 in | Wt 158.0 lb

## 2023-10-12 DIAGNOSIS — M7989 Other specified soft tissue disorders: Secondary | ICD-10-CM | POA: Diagnosis not present

## 2023-10-12 DIAGNOSIS — I480 Paroxysmal atrial fibrillation: Secondary | ICD-10-CM

## 2023-10-12 LAB — CBC

## 2023-10-12 MED ORDER — RIVAROXABAN 20 MG PO TABS: 20.0000 mg | ORAL_TABLET | Freq: Every day | ORAL | 4 refills | Status: DC

## 2023-10-12 NOTE — Telephone Encounter (Addendum)
 Requesting office sent duplicate request today. Pt has appt with Creighton Doffing, NP today. I will send update to NP that requesting office sent duplicate inquiring on clearance.   Once the pt has been cleared the provider will be sure to fax their notes to the surgeon's office with any recommendations and clearance.

## 2023-10-12 NOTE — Patient Instructions (Signed)
 Medication Instructions:  Restart xarelto  20 mg daily *If you need a refill on your cardiac medications before your next appointment, please call your pharmacy*  Lab Work: CBC, BMET-TODAY If you have labs (blood work) drawn today and your tests are completely normal, you will receive your results only by: MyChart Message (if you have MyChart) OR A paper copy in the mail If you have any lab test that is abnormal or we need to change your treatment, we will call you to review the results.  Testing/Procedures: Your physician has requested that you have an echocardiogram. Echocardiography is a painless test that uses sound waves to create images of your heart. It provides your doctor with information about the size and shape of your heart and how well your heart's chambers and valves are working. This procedure takes approximately one hour. There are no restrictions for this procedure. Please do NOT wear cologne, perfume, aftershave, or lotions (deodorant is allowed). Please arrive 15 minutes prior to your appointment time.  Please note: We ask at that you not bring children with you during ultrasound (echo/ vascular) testing. Due to room size and safety concerns, children are not allowed in the ultrasound rooms during exams. Our front office staff cannot provide observation of children in our lobby area while testing is being conducted. An adult accompanying a patient to their appointment will only be allowed in the ultrasound room at the discretion of the ultrasound technician under special circumstances. We apologize for any inconvenience.   Follow-Up: At [facility], you and your health needs are our priority.  As part of our continuing mission to provide you with exceptional heart care, our providers are all part of one team.  This team includes your primary Cardiologist (physician) and Advanced Practice Providers or APPs (Physician Assistants and Nurse Practitioners) who all work  together to provide you with the care you need, when you need it.  Your next appointment:   3 month(s)  Provider:   Harvie Liner, MD only

## 2023-10-13 ENCOUNTER — Ambulatory Visit: Payer: Self-pay | Admitting: Pulmonary Disease

## 2023-10-13 LAB — CBC
Hematocrit: 40.2 % (ref 34.0–46.6)
Hemoglobin: 13.3 g/dL (ref 11.1–15.9)
MCH: 31.4 pg (ref 26.6–33.0)
MCHC: 33.1 g/dL (ref 31.5–35.7)
MCV: 95 fL (ref 79–97)
Platelets: 248 10*3/uL (ref 150–450)
RBC: 4.24 x10E6/uL (ref 3.77–5.28)
RDW: 12.3 % (ref 11.7–15.4)
WBC: 6.1 10*3/uL (ref 3.4–10.8)

## 2023-10-13 LAB — BASIC METABOLIC PANEL WITH GFR
BUN/Creatinine Ratio: 24 (ref 12–28)
BUN: 18 mg/dL (ref 8–27)
CO2: 23 mmol/L (ref 20–29)
Calcium: 10.5 mg/dL — ABNORMAL HIGH (ref 8.7–10.3)
Chloride: 102 mmol/L (ref 96–106)
Creatinine, Ser: 0.74 mg/dL (ref 0.57–1.00)
Glucose: 79 mg/dL (ref 70–99)
Potassium: 5.1 mmol/L (ref 3.5–5.2)
Sodium: 139 mmol/L (ref 134–144)
eGFR: 83 mL/min/{1.73_m2} (ref >59)

## 2023-10-28 ENCOUNTER — Encounter: Payer: Self-pay | Admitting: Gastroenterology

## 2023-11-05 ENCOUNTER — Inpatient Hospital Stay: Attending: Hematology & Oncology

## 2023-11-05 MED ORDER — HEPARIN SOD (PORK) LOCK FLUSH 100 UNIT/ML IV SOLN
500.0000 [IU] | Freq: Once | INTRAVENOUS | Status: AC
  Administered 2023-11-05: 500 [IU] via INTRAVENOUS

## 2023-11-05 MED ORDER — SODIUM CHLORIDE 0.9% FLUSH
10.0000 mL | Freq: Once | INTRAVENOUS | Status: AC
  Administered 2023-11-05: 10 mL

## 2023-11-05 NOTE — Patient Instructions (Signed)

## 2023-11-10 ENCOUNTER — Encounter (HOSPITAL_COMMUNITY): Payer: Self-pay

## 2023-11-10 ENCOUNTER — Ambulatory Visit (HOSPITAL_COMMUNITY): Admit: 2023-11-10 | Admitting: Cardiology

## 2023-11-10 SURGERY — ATRIAL FIBRILLATION ABLATION
Anesthesia: General

## 2023-11-11 DIAGNOSIS — M25661 Stiffness of right knee, not elsewhere classified: Secondary | ICD-10-CM | POA: Diagnosis not present

## 2023-11-11 DIAGNOSIS — M25561 Pain in right knee: Secondary | ICD-10-CM | POA: Diagnosis not present

## 2023-11-11 DIAGNOSIS — G8929 Other chronic pain: Secondary | ICD-10-CM | POA: Diagnosis not present

## 2023-11-11 DIAGNOSIS — M1711 Unilateral primary osteoarthritis, right knee: Secondary | ICD-10-CM | POA: Diagnosis not present

## 2023-11-16 NOTE — H&P (Signed)
 TOTAL KNEE ADMISSION H&P  Patient is being admitted for right total knee arthroplasty.  Subjective:  Chief Complaint: Right knee pain.  HPI: Lori [name], 78 y.o. female has a history of pain and functional disability in the right knee due to arthritis and has failed non-surgical conservative treatments for greater than 12 weeks to include corticosteriod injections, viscosupplementation injections, and activity modification. Onset of symptoms was gradual, starting several years ago with gradually worsening course since that time. The patient noted prior procedures on the knee to include  arthroscopy and menisectomy on the right knee.  Patient currently rates pain in the right knee at 8 out of 10 with activity. Patient has worsening of pain with activity and weight bearing and pain that interferes with activities of daily living. Patient has evidence of joint space narrowing by imaging studies. There is no active infection.  Patient Active Problem List   Diagnosis Date Noted   Medication management 07/03/2022   Recurrent UTI 07/03/2022   Cellulitis and abscess of foot 07/02/2022   Essential hypertension 09/09/2018   Precordial chest pain 09/09/2018   Hormone disorder 02/22/2018   History of colon polyps 04/20/2017   Other constipation 04/20/2017   Benign mass of parotid gland 04/03/2016   PIC line (peripherally inserted central catheter) flush 03/24/2016   Cough 02/26/2016   Chronic vasomotor rhinitis 02/26/2016   Perennial allergic rhinitis 02/26/2016   Osteoporosis 09/05/2015   Primary hemochromatosis (HCC) 08/27/2015   Urinary tract infection, site not specified 04/24/2015   HSV-2 infection 09/01/2014   Elevated ferritin 08/16/2014   Family history of colon cancer 05/10/2014   Atrial bigeminy 04/13/2014   Atypical chest pain 03/23/2014   Dysphagia 03/23/2014   Esophageal reflux 03/23/2014   Heartburn 03/23/2014   Incomplete emptying of bladder 03/23/2014   Increased  frequency of urination 03/23/2014   Mixed connective tissue disease (HCC) 03/23/2014   Nonspecific elevation of level of transaminase or lactic acid dehydrogenase (LDH) 03/23/2014   Urinary incontinence 03/23/2014   Urinary straining 03/23/2014   Urinary hesitancy 03/23/2014   Vaginal candidiasis 03/23/2014   Palpitation 04/23/2013   Chronic fatigue 04/23/2013   History of bladder surgery 04/23/2013   PNEUMONIA, ATYPICAL 04/27/2009   Diffuse connective tissue disease (HCC) 04/27/2009   LUNG NODULE 08/24/2007   SHORTNESS OF BREATH (SOB) 08/24/2007    Past Medical History:  Diagnosis Date   Autoimmune hepatitis (HCC)    Cancer (HCC) 06/2017   SQUAMOS CELL REMOVED FROM FOREHEAD   Chronic fatigue    Complication of anesthesia    Partial Hysterectomy-Severe pain that radiated to esophogaus, and 12 more episode    Hemochromatosis    Hormone disorder    Hypothyroidism    IBS (irritable bowel syndrome)    Lupus    Mixed connective tissue disease (HCC)    Osteoporosis 07/2017   T score -2.7 stable from prior DEXA   Palpitations    Rheumatoid arthritis (HCC)     Past Surgical History:  Procedure Laterality Date   ABDOMINAL HYSTERECTOMY     Partial; pt states has only had ovaries removed   ABDOMINAL SURGERY     ABDOMINOPLASTY   BLADDER SUSPENSION     BREAST SURGERY  1980   LUMPECTOMY X 2 FROM RIGHT BREAST   CARDIOVASCULAR STRESS TEST  08/28/2011   Normal   CAROTID DOPPLER  08/28/2011   Normal, no evidence of significant diameter reduction, dissection, or vascular abnormality   COLONOSCOPY W/ POLYPECTOMY  2018   IR IMAGING  GUIDED PORT INSERTION  04/27/2020   IR RADIOLOGIST EVAL & MGMT  03/20/2021   LAPAROSCOPIC CHOLECYSTECTOMY     phlebotomies     TONSILLECTOMY     TONSILLECTOMY AND ADENOIDECTOMY     TRANSTHORACIC ECHOCARDIOGRAM  01/29/2011   EF >55%, mild concentric LVH   TUBAL LIGATION      Prior to Admission medications   Medication Sig Start Date End Date Taking?  Authorizing Provider  amiloride-hydrochlorothiazide (MODURETIC) 5-50 MG tablet Take 0.5-1 tablets by mouth daily. 11/21/16  Yes [provider]  Ascorbic Acid (VITAMIN C) 1000 MG tablet Take 3,000 mg by mouth daily.   Yes [provider]  aspirin 325 MG tablet Take 325 mg by mouth daily as needed for mild pain (pain score 1-3) or moderate pain (pain score 4-6).   Yes [provider]  benzonatate (TESSALON) 100 MG capsule Take 100 mg by mouth daily as needed for cough.   Yes [provider]  Cholecalciferol (VITAMIN D  PO) Take 1 tablet by mouth daily.   Yes [provider]  Collagen-Vitamin C-Biotin (COLLAGEN PO) Take 1.5 Scoops by mouth daily. Peptieds   Yes [provider]  dextroamphetamine (DEXTROSTAT) 5 MG tablet Take 5 mg by mouth daily as needed (Chronic fatigue). 03/11/23  Yes [provider]  Esomeprazole Magnesium (NEXIUM PO) Take 1 capsule by mouth at bedtime as needed (Heartburn).   Yes [provider]  fish oil-omega-3 fatty acids 1000 MG capsule Take 2 g by mouth daily.   Yes [provider]  GLUTATHIONE PO Take 1 tablet by mouth daily.   Yes [provider]  hydroxychloroquine (PLAQUENIL) 200 MG tablet Take 200 mg by mouth daily. 02/27/23  Yes [provider]  ibuprofen (ADVIL) 200 MG tablet Take 200 mg by mouth every 6 (six) hours as needed for moderate pain (pain score 4-6).   Yes [provider]  IVERMECTIN PO Take 12 mg by mouth daily.   Yes [provider]  l-methylfolate-B6-B12 (METANX) 3-35-2 MG TABS tablet Take 1 tablet by mouth daily.   Yes [provider]  MAGNESIUM PO Take 4 tablets by mouth daily. Citrate/Chelate   Yes [provider]  melatonin 5 MG TABS Take 5 mg by mouth at bedtime. Timed release   Yes [provider]  metoprolol  succinate (TOPROL -XL) 25 MG 24 hr tablet Take 25 mg by mouth daily as needed (Afib).   Yes [provider]  Multiple Vitamin (MULTIVITAMIN PO) Take 1 tablet by mouth daily.   Yes [provider]  NON FORMULARY Take 1 tablet by mouth daily. Methylcobalamin   Yes [provider]  OVER THE COUNTER MEDICATION Take 6 drops by mouth daily as needed. Clo2/in 4 oz water   Yes [provider]  OVER THE COUNTER MEDICATION Take 7-10 drops by mouth daily as needed (Pain). DMSO   Yes [provider]  OVER THE COUNTER MEDICATION Take 1 drop by mouth daily as needed (Immuine system). colloidal silver   Yes [provider]  OVER THE COUNTER MEDICATION Take 1 tablet by mouth daily. Lumbrokinase   Yes [provider]  OVER THE COUNTER MEDICATION Take 1 tablet by mouth. G I revive   Yes [provider]  potassium chloride (KLOR-CON M) 10 MEQ tablet Take 20 mEq by mouth daily. 03/09/14  Yes [provider]  pregabalin (LYRICA) 25 MG capsule Take 50 mg by mouth 3 (three) times daily.   Yes [provider]  pregabalin (LYRICA) 75 MG capsule Take 75 mg by mouth at bedtime. 08/04/23  Yes [provider]  Pregnenolone Micronized (PREGNENOLONE PO) Take 1 tablet by mouth daily. DHea   Yes [provider]  PRESCRIPTION MEDICATION Take 5-10 mg by mouth daily as needed (Chronic fatigue). Dextroamphetamine Extended release   Yes [provider]  Probiotic Product (PROBIOTIC DAILY PO) Take 1 capsule by mouth daily. [facility]   Yes [provider]  Pseudoephedrine-Acetaminophen  (SM NON-ASPRIN SINUS PO) Take by mouth.   Yes [provider]  thyroid  (ARMOUR) 15 MG tablet Take 15 mg by mouth See admin instructions. Only Monday-Friday   Yes [provider]  thyroid  (ARMOUR) 60 MG tablet Take 60 mg by mouth daily before breakfast.   Yes [provider]  zinc gluconate 50 MG tablet Take 50 mg by mouth daily as needed (Flu season).   Yes [provider]  rivaroxaban   (XARELTO ) 20 MG TABS tablet Take 1 tablet (20 mg total) by mouth daily with supper. Patient not taking: Reported on 11/13/2023 10/12/23   Aniceto Daphne CROME, NP    Allergies  Allergen Reactions   Amoxicillin Nausea And Vomiting   Codeine Nausea Only   Keflex [Cephalexin] Itching   Eliquis  [Apixaban ] Other (See Comments)    Blood in stool,joint pain dizziness   Erythromycin Nausea Only and Other (See Comments)   Iodinated Contrast Media Other (See Comments)   Meperidine     Pt stated, I don't know what this is; 04/27/17   Metoclopramide Hcl    Metoprolol      Tired fatigue itching and hdizziness   Percocet [Oxycodone -Acetaminophen ]     Made pt sick    Oxycodone  Nausea And Vomiting    Pt stated, I do not know if I am allergic to this; 04/27/17    Social History   Socioeconomic History   Marital status: Married    Spouse name: Not on file   Number of children: Not on file   Years of education: Not on file   Highest education level: Not on file  Occupational History   Not on file  Tobacco Use   Smoking status: Never   Smokeless tobacco: Never  Vaping Use   Vaping status: Never Used  Substance and Sexual Activity   Alcohol  use: Not Currently    Comment: wine occ   Drug use: No   Sexual activity: Not Currently    Birth control/protection: Post-menopausal  Other Topics Concern   Not on file  Social History Narrative   Not on file   Social Drivers of Health   Financial Resource Strain: Not on file  Food Insecurity: Not on file  Transportation Needs: Not on file  Physical Activity: Not on file  Stress: Not on file  Social Connections: Unknown (10/05/2021)   Received from [facility]   Social Network    Social Network: Not on file  Intimate Partner Violence: Unknown (08/27/2021)   Received from [facility]   HITS    Physically Hurt: Not on file    Insult or Talk Down To: Not on file    Threaten Physical Harm: Not on file    Scream or Curse: Not on file     Tobacco Use: Low Risk  (11/05/2023)   Patient History    Smoking Tobacco Use: Never    Smokeless Tobacco Use: Never    Passive Exposure: Not on file   Social History   Substance and Sexual Activity  Alcohol  Use  Not Currently   Comment: wine occ    Family History  Problem Relation Age of Onset   Seizures Mother    Transient ischemic attack Mother    Glaucoma Mother    Cancer Father 62       COLON   Diabetes Father    Heart disease Father    Breast cancer Maternal Aunt    Heart disease Paternal Uncle    Breast cancer Maternal Aunt    Glaucoma Maternal Grandmother     ROS  Objective:  Physical Exam: - Well-developed female alert and oriented in no apparent distress    - Right knee shows slight valgus deformity.    - There is an effusion.    - Range of motion is approximately 5-120 with crepitus on range of motion.    - Tenderness is noted laterally greater than medially.    - No instability noted.    - Antalgic gait pattern on the right.    IMAGING:   - MRI of the right knee shows full-thickness cartilage loss in the lateral and patellofemoral compartments with a lateral meniscal tear.  Assessment/Plan:  End stage arthritis, right knee   The patient history, physical examination, clinical judgment of the provider and imaging studies are consistent with end stage degenerative joint disease of the right knee and total knee arthroplasty is deemed medically necessary. The treatment options including medical management, injection therapy arthroscopy and arthroplasty were discussed at length. The risks and benefits of total knee arthroplasty were presented and reviewed. The risks due to aseptic loosening, infection, stiffness, patella tracking problems, thromboembolic complications and other imponderables were discussed. The patient acknowledged the explanation, agreed to proceed with the plan and consent was signed. Patient is being admitted for inpatient treatment  for surgery, pain control, PT, OT, prophylactic antibiotics, VTE prophylaxis, progressive ambulation and ADLs and discharge planning. The patient is planning to be discharged home.   Patient's anticipated LOS is less than 2 midnights, meeting these requirements: - Younger than 60 - Lives within 1 hour of care - Has a competent adult at home to recover with post-op recover - NO history of  - Chronic pain requiring opiods  - Diabetes  - Coronary Artery Disease  - Heart failure  - Heart attack  - Stroke  - DVT/VTE  - Respiratory Failure/COPD  - Renal failure  - Anemia  - Advanced Liver disease   PT: EO Disposition: Home with husband Planned DVT Prophylaxis: Xarelto  10mg  QD (Hx A fib) DME Needed: None, has a walker PCP: Elsie Gentry, MD (clearance received) Cardiologist: Debby Sor, MD (clearance received) TXA: IV  Allergies: Percocet? (nausea/vomiting), amoxicillin (nausea), eliquis  (rash, itching, rectal bleeding), cephalexin (itching), codeine (nausea), erythromycin, iodinated contrast (cough, SOB), metoprolol  (itching, dizziness), reglan (rash) Anesthesia Concerns: Pt reports URI after supplemental O2 in OR for toe surgery. Also reports liver damage after anesthetic gas use during partial hysterectomy BMI: 24.6 Last HgbA1c: Not diabetic  Pharmacy: [facility]   Other: -Discussed hydromorphone, tramadol  -Pt recalls allergy to a pain medicine years ago which caused N/V, thinks it was Percocet -Cephalexin is listed as an allergy but patient denies any reaction to this medication   - Patient was instructed on what medications to stop prior to surgery. - Follow-up visit in 2 weeks with Dr. Melodi - Begin physical therapy following surgery - Pre-operative lab work as pre-surgical testing - Prescriptions will be provided in hospital at time of discharge  Corean Sender, PA-C Orthopedic Surgery  [facility] [place]

## 2023-11-17 ENCOUNTER — Ambulatory Visit (HOSPITAL_COMMUNITY)
Admission: RE | Admit: 2023-11-17 | Discharge: 2023-11-17 | Disposition: A | Discharge status: Home / Self Care | Source: Ambulatory Visit | Attending: Pulmonary Disease | Admitting: Pulmonary Disease

## 2023-11-17 DIAGNOSIS — I480 Paroxysmal atrial fibrillation: Secondary | ICD-10-CM

## 2023-11-17 LAB — ECHOCARDIOGRAM COMPLETE
Area-P 1/2: 4.49 cm2
S' Lateral: 2.6 cm

## 2023-11-17 NOTE — Progress Notes (Addendum)
  Date of COVID positive in last 90 days:  No  PCP - Elsie Gentry, MD Cardiologist - Ezra Shuck, MD Electrophysiology - Ole Holts, MD  Medical clearance and notes under media  Cardiac clearance in Epic dated 10-12-23  Chest x-ray - N/A EKG - 10-12-23 Epic Stress Test - 08-28-11 Epic ECHO - 11-17-23 Epic Cardiac Cath - N/A Pacemaker/ICD device last checked: N/A Spinal Cord Stimulator:  N/A Cardiac CT - 05-26-23 Epic Long Term Monitor - 08-12-21 Epic  Pt has port-a-cath  Bowel Prep - N/A  Sleep Study - N/A CPAP -   Fasting Blood Sugar - N/A Checks Blood Sugar _____ times a day  Last dose of GLP1 agonist-  N/A GLP1 instructions:  Do not take after     Last dose of SGLT-2 inhibitors-  N/A SGLT-2 instructions:  Do not take after     Blood Thinner Instructions:  Last dose:   Time: Aspirin Instructions:  ASA 325.  Patient is aware to hold a week prior to surgery  Last Dose:  Activity level:  Can go up a flight of stairs and perform activities of daily living without stopping and without symptoms of chest pain or shortness of breath.  Anesthesia review:  Afib, MCTD,  lupus, autoimmune hepatitis, hemachromatosis  Patient denies shortness of breath, fever, cough and chest pain at PAT appointment  Patient verbalized understanding of instructions that were given to them at the PAT appointment. Patient was also instructed that they will need to review over the PAT instructions again at home before surgery.

## 2023-11-17 NOTE — Patient Instructions (Addendum)
 SURGICAL WAITING ROOM VISITATION Patients having surgery or a procedure may have no more than 2 support people in the waiting area - these visitors may rotate.    Children under the age of 51 must have an adult with them who is not the patient.  If the patient needs to stay at the hospital during part of their recovery, the visitor guidelines for inpatient rooms apply. Pre-op nurse will coordinate an appropriate time for 1 support person to accompany patient in pre-op.  This support person may not rotate.    Please refer to the [facility] website for the visitor guidelines for Inpatients (after your surgery is over and you are in a regular room).       Your procedure is scheduled on: 11-30-23   Report to [facility] Main Entrance    Report to admitting at 6:55 AM   Call this number if you have problems the morning of surgery [phone]   Do not eat food :After Midnight.   After Midnight you may have the following liquids until 6:20 AM DAY OF SURGERY  Water Non-Citrus Juices (without pulp, NO RED-Apple, White grape, White cranberry) Black Coffee (NO MILK/CREAM OR CREAMERS, sugar ok)  Clear Tea (NO MILK/CREAM OR CREAMERS, sugar ok) regular and decaf                             Plain Jell-O (NO RED)                                           Fruit ices (not with fruit pulp, NO RED)                                     Popsicles (NO RED)                                                               Sports drinks like Gatorade (NO RED)                   The day of surgery:  Drink ONE (1) Pre-Surgery Clear Ensure by 6:20 AM the morning of surgery. Drink in one sitting. Do not sip.  This drink was given to you during your hospital  pre-op appointment visit. Nothing else to drink after completing the Pre-Surgery Clear Ensure.          If you have questions, please contact your surgeon's office.   FOLLOW  ANY ADDITIONAL PRE OP INSTRUCTIONS YOU RECEIVED FROM YOUR SURGEON'S  OFFICE!!!     Oral Hygiene is also important to reduce your risk of infection.                                    Remember - BRUSH YOUR TEETH THE MORNING OF SURGERY WITH YOUR REGULAR TOOTHPASTE   Do NOT smoke after Midnight   Take these medicines the morning of surgery:    Esomeprazole   Ivermectin   Metoprolol   Pregabalin   Armour thyroid    Aspirin 325 mg - hold a week prior to surgery  Stop all vitamins and herbal supplements 7 days before surgery                             You may not have any metal on your body including hair pins, jewelry, and body piercing             Do not wear make-up, lotions, powders, perfumes, or deodorant  Do not wear nail polish including gel and S&S, artificial/acrylic nails, or any other type of covering on natural nails including finger and toenails. If you have artificial nails, gel coating, etc. that needs to be removed by a nail salon please have this removed prior to surgery or surgery may need to be canceled/ delayed if the surgeon/ anesthesia feels like they are unable to be safely monitored.   Do not shave  48 hours prior to surgery.         Do not bring valuables to the hospital. [facility] IS NOT RESPONSIBLE   FOR VALUABLES.   Contacts, dentures or bridgework may not be worn into surgery.   Bring small overnight bag day of surgery.   DO NOT BRING YOUR HOME MEDICATIONS TO THE HOSPITAL. PHARMACY WILL DISPENSE MEDICATIONS LISTED ON YOUR MEDICATION LIST TO YOU DURING YOUR ADMISSION IN THE HOSPITAL!   Please read over the following fact sheets you were given: IF YOU HAVE QUESTIONS ABOUT YOUR PRE-OP INSTRUCTIONS PLEASE CALL [phone] [facility]  If you received a COVID test during your pre-op visit  it is requested that you wear a mask when out in public, stay away from anyone that may not be feeling well and notify your surgeon if you develop symptoms. If you test positive for Covid or have been in contact with anyone that has tested  positive in the last 10 days please notify you surgeon.    Pre-operative 5 CHG Bath Instructions   You can play a key role in reducing the risk of infection after surgery. Your skin needs to be as free of germs as possible. You can reduce the number of germs on your skin by washing with CHG (chlorhexidine  gluconate) soap before surgery. CHG is an antiseptic soap that kills germs and continues to kill germs even after washing.   DO NOT use if you have an allergy to chlorhexidine /CHG or antibacterial soaps. If your skin becomes reddened or irritated, stop using the CHG and notify one of our RNs at [phone].   Please shower with the CHG soap starting 4 days before surgery using the following schedule:     Please keep in mind the following:  DO NOT shave, including legs and underarms, starting the day of your first shower.   You may shave your face at any point before/day of surgery.  Place clean sheets on your bed the day you start using CHG soap. Use a clean washcloth (not used since being washed) for each shower. DO NOT sleep with pets once you start using the CHG.   CHG Shower Instructions:  If you choose to wash your hair and private area, wash first with your normal shampoo/soap.  After you use shampoo/soap, rinse your hair and body thoroughly to remove shampoo/soap residue.  Turn the water OFF and apply about 3 tablespoons (45 ml) of CHG soap to a CLEAN washcloth.  Apply CHG soap ONLY FROM YOUR NECK  DOWN TO YOUR TOES (washing for 3-5 minutes)  DO NOT use CHG soap on face, private areas, open wounds, or sores.  Pay special attention to the area where your surgery is being performed.  If you are having back surgery, having someone wash your back for you may be helpful. Wait 2 minutes after CHG soap is applied, then you may rinse off the CHG soap.  Pat dry with a clean towel  Put on clean clothes/pajamas   If you choose to wear lotion, please use ONLY the CHG-compatible lotions on  the back of this paper.     Additional instructions for the day of surgery: DO NOT APPLY any lotions, deodorants, cologne, or perfumes.   Put on clean/comfortable clothes.  Brush your teeth.  Ask your nurse before applying any prescription medications to the skin.      CHG Compatible Lotions   Aveeno Moisturizing lotion  Cetaphil Moisturizing Cream  Cetaphil Moisturizing Lotion  Clairol Herbal Essence Moisturizing Lotion, Dry Skin  Clairol Herbal Essence Moisturizing Lotion, Extra Dry Skin  Clairol Herbal Essence Moisturizing Lotion, Normal Skin  Curel Age Defying Therapeutic Moisturizing Lotion with Alpha Hydroxy  Curel Extreme Care Body Lotion  Curel Soothing Hands Moisturizing Hand Lotion  Curel Therapeutic Moisturizing Cream, Fragrance-Free  Curel Therapeutic Moisturizing Lotion, Fragrance-Free  Curel Therapeutic Moisturizing Lotion, Original Formula  Eucerin Daily Replenishing Lotion  Eucerin Dry Skin Therapy Plus Alpha Hydroxy Crme  Eucerin Dry Skin Therapy Plus Alpha Hydroxy Lotion  Eucerin Original Crme  Eucerin Original Lotion  Eucerin Plus Crme Eucerin Plus Lotion  Eucerin TriLipid Replenishing Lotion  Keri Anti-Bacterial Hand Lotion  Keri Deep Conditioning Original Lotion Dry Skin Formula Softly Scented  Keri Deep Conditioning Original Lotion, Fragrance Free Sensitive Skin Formula  Keri Lotion Fast Absorbing Fragrance Free Sensitive Skin Formula  Keri Lotion Fast Absorbing Softly Scented Dry Skin Formula  Keri Original Lotion  Keri Skin Renewal Lotion Keri Silky Smooth Lotion  Keri Silky Smooth Sensitive Skin Lotion  Nivea Body Creamy Conditioning Oil  Nivea Body Extra Enriched Lotion  Nivea Body Original Lotion  Nivea Body Sheer Moisturizing Lotion Nivea Crme  Nivea Skin Firming Lotion  NutraDerm 30 Skin Lotion  NutraDerm Skin Lotion  NutraDerm Therapeutic Skin Cream  NutraDerm Therapeutic Skin Lotion  ProShield Protective Hand Cream  Provon  moisturizing lotion   PATIENT SIGNATURE_________________________________  NURSE SIGNATURE__________________________________  ________________________________________________________________________    Lori [name]  An incentive spirometer is a tool that can help keep your lungs clear and active. This tool measures how well you are filling your lungs with each breath. Taking long deep breaths may help reverse or decrease the chance of developing breathing (pulmonary) problems (especially infection) following: A long period of time when you are unable to move or be active. BEFORE THE PROCEDURE  If the spirometer includes an indicator to show your best effort, your nurse or respiratory therapist will set it to a desired goal. If possible, sit up straight or lean slightly forward. Try not to slouch. Hold the incentive spirometer in an upright position. INSTRUCTIONS FOR USE  Sit on the edge of your bed if possible, or sit up as far as you can in bed or on a chair. Hold the incentive spirometer in an upright position. Breathe out normally. Place the mouthpiece in your mouth and seal your lips tightly around it. Breathe in slowly and as deeply as possible, raising the piston or the ball toward the top of the column. Hold your breath for 3-5  seconds or for as long as possible. Allow the piston or ball to fall to the bottom of the column. Remove the mouthpiece from your mouth and breathe out normally. Rest for a few seconds and repeat Steps 1 through 7 at least 10 times every 1-2 hours when you are awake. Take your time and take a few normal breaths between deep breaths. The spirometer may include an indicator to show your best effort. Use the indicator as a goal to work toward during each repetition. After each set of 10 deep breaths, practice coughing to be sure your lungs are clear. If you have an incision (the cut made at the time of surgery), support your incision when coughing by  placing a pillow or rolled up towels firmly against it. Once you are able to get out of bed, walk around indoors and cough well. You may stop using the incentive spirometer when instructed by your caregiver.  RISKS AND COMPLICATIONS Take your time so you do not get dizzy or light-headed. If you are in pain, you may need to take or ask for pain medication before doing incentive spirometry. It is harder to take a deep breath if you are having pain. AFTER USE Rest and breathe slowly and easily. It can be helpful to keep track of a log of your progress. Your caregiver can provide you with a simple table to help with this. If you are using the spirometer at home, follow these instructions: SEEK MEDICAL CARE IF:  You are having difficultly using the spirometer. You have trouble using the spirometer as often as instructed. Your pain medication is not giving enough relief while using the spirometer. You develop fever of 100.5 F (38.1 C) or higher. SEEK IMMEDIATE MEDICAL CARE IF:  You cough up bloody sputum that had not been present before. You develop fever of 102 F (38.9 C) or greater. You develop worsening pain at or near the incision site. MAKE SURE YOU:  Understand these instructions. Will watch your condition. Will get help right away if you are not doing well or get worse. Document Released: 09/22/2006 Document Revised: 08/04/2011 Document Reviewed: 11/23/2006 [facility] Patient Information 2014 Between, [place].   ________________________________________________________________________

## 2023-11-18 ENCOUNTER — Encounter (HOSPITAL_COMMUNITY)
Admission: RE | Admit: 2023-11-18 | Discharge: 2023-11-18 | Disposition: A | Discharge status: Home / Self Care | Source: Ambulatory Visit | Attending: Orthopedic Surgery | Admitting: Orthopedic Surgery

## 2023-11-18 ENCOUNTER — Other Ambulatory Visit: Payer: Self-pay

## 2023-11-18 ENCOUNTER — Encounter (HOSPITAL_COMMUNITY): Payer: Self-pay

## 2023-11-18 VITALS — BP 130/71 | HR 86 | Temp 98.3°F | Resp 16 | Ht 66.5 in | Wt 148.0 lb

## 2023-11-18 DIAGNOSIS — M1711 Unilateral primary osteoarthritis, right knee: Secondary | ICD-10-CM | POA: Diagnosis not present

## 2023-11-18 DIAGNOSIS — E039 Hypothyroidism, unspecified: Secondary | ICD-10-CM | POA: Insufficient documentation

## 2023-11-18 DIAGNOSIS — Z79890 Hormone replacement therapy (postmenopausal): Secondary | ICD-10-CM | POA: Diagnosis not present

## 2023-11-18 DIAGNOSIS — I4891 Unspecified atrial fibrillation: Secondary | ICD-10-CM | POA: Insufficient documentation

## 2023-11-18 DIAGNOSIS — K759 Inflammatory liver disease, unspecified: Secondary | ICD-10-CM | POA: Diagnosis not present

## 2023-11-18 DIAGNOSIS — I071 Rheumatic tricuspid insufficiency: Secondary | ICD-10-CM | POA: Insufficient documentation

## 2023-11-18 DIAGNOSIS — K754 Autoimmune hepatitis: Secondary | ICD-10-CM | POA: Insufficient documentation

## 2023-11-18 DIAGNOSIS — Z01812 Encounter for preprocedural laboratory examination: Secondary | ICD-10-CM | POA: Insufficient documentation

## 2023-11-18 DIAGNOSIS — M069 Rheumatoid arthritis, unspecified: Secondary | ICD-10-CM | POA: Diagnosis not present

## 2023-11-18 DIAGNOSIS — Z01818 Encounter for other preprocedural examination: Secondary | ICD-10-CM

## 2023-11-18 DIAGNOSIS — L930 Discoid lupus erythematosus: Secondary | ICD-10-CM | POA: Diagnosis not present

## 2023-11-18 DIAGNOSIS — I2510 Atherosclerotic heart disease of native coronary artery without angina pectoris: Secondary | ICD-10-CM | POA: Insufficient documentation

## 2023-11-18 HISTORY — DX: Personal history of urinary calculi: Z87.442

## 2023-11-18 HISTORY — DX: Gastro-esophageal reflux disease without esophagitis: K21.9

## 2023-11-18 LAB — COMPREHENSIVE METABOLIC PANEL WITH GFR
ALT: 23 U/L (ref 0–44)
AST: 30 U/L (ref 15–41)
Albumin: 4.1 g/dL (ref 3.5–5.0)
Alkaline Phosphatase: 71 U/L (ref 38–126)
Anion gap: 11 (ref 5–15)
BUN: 18 mg/dL (ref 8–23)
CO2: 24 mmol/L (ref 22–32)
Calcium: 10.1 mg/dL (ref 8.9–10.3)
Chloride: 104 mmol/L (ref 98–111)
Creatinine, Ser: 0.63 mg/dL (ref 0.44–1.00)
GFR, Estimated: >60 mL/min (ref >60)
Glucose, Bld: 105 mg/dL — ABNORMAL HIGH (ref 70–99)
Potassium: 4.3 mmol/L (ref 3.5–5.1)
Sodium: 139 mmol/L (ref 135–145)
Total Bilirubin: 1.1 mg/dL (ref 0.0–1.2)
Total Protein: 6.5 g/dL (ref 6.5–8.1)

## 2023-11-18 LAB — CBC
HCT: 44.4 % (ref 36.0–46.0)
Hemoglobin: 14.4 g/dL (ref 12.0–15.0)
MCH: 31.6 pg (ref 26.0–34.0)
MCHC: 32.4 g/dL (ref 30.0–36.0)
MCV: 97.6 fL (ref 80.0–100.0)
Platelets: 210 10*3/uL (ref 150–400)
RBC: 4.55 MIL/uL (ref 3.87–5.11)
RDW: 12.8 % (ref 11.5–15.5)
WBC: 6.7 10*3/uL (ref 4.0–10.5)
nRBC: 0 % (ref 0.0–0.2)

## 2023-11-18 LAB — SURGICAL PCR SCREEN
MRSA, PCR: NEGATIVE
Staphylococcus aureus: NEGATIVE

## 2023-11-19 NOTE — Progress Notes (Signed)
 Anesthesia Chart Review   Case: [id number] Date/Time: 11/30/23 0910   Procedure: ARTHROPLASTY, KNEE, TOTAL (Right: Knee)   Anesthesia type: Choice   Pre-op diagnosis: Right knee osteoarthritisi   Location: [facility] ROOM 09 / [facility]   Surgeons: [name] Lerner, MD       DISCUSSION:78 y.o. never smoker with h/o hypothyroidism, Lupus, RA, autoimmune hepatitis, hemochromatosis with periodic phlebotomy, atrial fibrillation, right knee OA scheduled for above procedure 11/30/2023 with Dr. Lerner [name].   Pt will have ablation for a-fib after knee surgery. Pt advised not to start Xarelto  at this time.    Pt last seen by cardiology 10/12/2023. Per OV note, [name]'s perioperative risk of a major cardiac event is 0.4% according to the Revised Cardiac Risk Index (RCRI).  Therefore, she is at low risk for perioperative complications.      Recommendations: According to ACC/AHA guidelines, no further cardiovascular testing needed.  The patient may proceed to surgery at acceptable risk.  Pt follows with hematology for hemochromatosis. Last seen 09/02/2023. Phlebotomy at this visit, 8 week follow up recommended.   VS: BP 130/71   Pulse 86   Temp 36.8 C (Oral)   Resp 16   Ht 5' 6.5 (1.689 m)   Wt 67.1 kg   SpO2 98%   BMI 23.53 kg/m   PROVIDERS: Lori [name]., MD is PCP    LABS: Labs reviewed: Acceptable for surgery. (all labs ordered are listed, but only abnormal results are displayed)  Labs Reviewed  COMPREHENSIVE METABOLIC PANEL WITH GFR - Abnormal; Notable for the following components:      Result Value   Glucose, Bld 105 (*)    All other components within normal limits  SURGICAL PCR SCREEN  CBC     IMAGES:   EKG:   CV: Echo 11/17/2023 1. Left ventricular ejection fraction, by estimation, is 65 to 70%. Left  ventricular ejection fraction by 3D volume is 69 %. The left ventricle has  normal function. The left ventricle has no regional wall motion  abnormalities. There is  mild concentric  left ventricular hypertrophy. Left ventricular diastolic parameters are  consistent with Grade I diastolic dysfunction (impaired relaxation).   2. Right ventricular systolic function is normal. The right ventricular  size is normal. There is normal pulmonary artery systolic pressure. The  estimated right ventricular systolic pressure is 29.0 mmHg.   3. Left atrial size was mildly dilated.   4. Right atrial size was moderately dilated.   5. The mitral valve is normal in structure. Trivial mitral valve  regurgitation. No evidence of mitral stenosis.   6. Tricuspid valve regurgitation is moderate.   7. The aortic valve is tricuspid. There is mild calcification of the  aortic valve. Aortic valve regurgitation is trivial. Aortic valve  sclerosis/calcification is present, without any evidence of aortic  stenosis.   8. The inferior vena cava is normal in size with greater than 50%  respiratory variability, suggesting right atrial pressure of 3 mmHg.  Past Medical History:  Diagnosis Date   Autoimmune hepatitis (HCC)    Cancer (HCC) 06/2017   SQUAMOS CELL REMOVED FROM FOREHEAD   Chronic fatigue    Complication of anesthesia    Partial Hysterectomy-Severe pain that radiated to esophogaus, and 12 more episode    GERD (gastroesophageal reflux disease)    Hemochromatosis    History of kidney stones    Hormone disorder    Hypothyroidism    IBS (irritable bowel syndrome)    Lupus  Mixed connective tissue disease (HCC)    Osteoporosis 07/2017   T score -2.7 stable from prior DEXA   Palpitations    Rheumatoid arthritis (HCC)     Past Surgical History:  Procedure Laterality Date   ABDOMINAL HYSTERECTOMY     Partial; pt states has only had ovaries removed   ABDOMINAL SURGERY     ABDOMINOPLASTY   BLADDER SUSPENSION     BREAST SURGERY  1980   LUMPECTOMY X 2 FROM RIGHT BREAST   CARDIOVASCULAR STRESS TEST  08/28/2011   Normal   CAROTID DOPPLER  08/28/2011   Normal, no  evidence of significant diameter reduction, dissection, or vascular abnormality   COLONOSCOPY W/ POLYPECTOMY  2018   IR IMAGING GUIDED PORT INSERTION  04/27/2020   IR RADIOLOGIST EVAL & MGMT  03/20/2021   LAPAROSCOPIC CHOLECYSTECTOMY     phlebotomies     TONSILLECTOMY     TONSILLECTOMY AND ADENOIDECTOMY     TRANSTHORACIC ECHOCARDIOGRAM  01/29/2011   EF >55%, mild concentric LVH   TUBAL LIGATION      MEDICATIONS:  amiloride-hydrochlorothiazide (MODURETIC) 5-50 MG tablet   Ascorbic Acid (VITAMIN C) 1000 MG tablet   aspirin 325 MG tablet   benzonatate (TESSALON) 100 MG capsule   Cholecalciferol (VITAMIN D  PO)   Collagen-Vitamin C-Biotin (COLLAGEN PO)   dextroamphetamine (DEXTROSTAT) 5 MG tablet   Esomeprazole Magnesium (NEXIUM PO)   fish oil-omega-3 fatty acids 1000 MG capsule   GLUTATHIONE PO   hydroxychloroquine (PLAQUENIL) 200 MG tablet   ibuprofen (ADVIL) 200 MG tablet   IVERMECTIN PO   l-methylfolate-B6-B12 (METANX) 3-35-2 MG TABS tablet   MAGNESIUM PO   melatonin 5 MG TABS   metoprolol  succinate (TOPROL -XL) 25 MG 24 hr tablet   Multiple Vitamin (MULTIVITAMIN PO)   NON FORMULARY   OVER THE COUNTER MEDICATION   OVER THE COUNTER MEDICATION   OVER THE COUNTER MEDICATION   OVER THE COUNTER MEDICATION   OVER THE COUNTER MEDICATION   potassium chloride (KLOR-CON M) 10 MEQ tablet   pregabalin (LYRICA) 25 MG capsule   pregabalin (LYRICA) 75 MG capsule   Pregnenolone Micronized (PREGNENOLONE PO)   PRESCRIPTION MEDICATION   Probiotic Product (PROBIOTIC DAILY PO)   Pseudoephedrine-Acetaminophen  (SM NON-ASPRIN SINUS PO)   rivaroxaban  (XARELTO ) 20 MG TABS tablet   thyroid  (ARMOUR) 15 MG tablet   thyroid  (ARMOUR) 60 MG tablet   zinc gluconate 50 MG tablet    triamcinolone  acetonide (KENALOG ) 10 MG/ML injection 10 mg    Kittie Krizan Ward, PA-C [facility] Pre-Surgical Testing [phone]

## 2023-11-20 NOTE — Anesthesia Preprocedure Evaluation (Addendum)
 Anesthesia Evaluation  Patient identified by MRN, date of birth, ID band Patient awake    Reviewed: Allergy & Precautions, NPO status , Patient's Chart, lab work & pertinent test results  Airway Mallampati: II  TM Distance: >3 FB Neck ROM: Full    Dental  (+) Dental Advisory Given   Pulmonary neg pulmonary ROS   breath sounds clear to auscultation       Cardiovascular hypertension, Pt. on medications and Pt. on home beta blockers + dysrhythmias Atrial Fibrillation  Rhythm:Regular Rate:Normal     Neuro/Psych negative neurological ROS     GI/Hepatic ,GERD  ,,(+) Hepatitis -  Endo/Other  Hypothyroidism    Renal/GU negative Renal ROS     Musculoskeletal  (+) Arthritis ,    Abdominal   Peds  Hematology negative hematology ROS (+)   Anesthesia Other Findings   Reproductive/Obstetrics                              Anesthesia Physical Anesthesia Plan  ASA: 3  Anesthesia Plan: Spinal   Post-op Pain Management: Regional block* and Ofirmev  IV (intra-op)*   Induction:   PONV Risk Score and Plan: 2 and Propofol  infusion, Dexamethasone , Ondansetron  and Treatment may vary due to age or medical condition  Airway Management Planned: Natural Airway and Simple Face Mask  Additional Equipment:   Intra-op Plan:   Post-operative Plan:   Informed Consent: I have reviewed the patients History and Physical, chart, labs and discussed the procedure including the risks, benefits and alternatives for the proposed anesthesia with the patient or authorized representative who has indicated his/her understanding and acceptance.       Plan Discussed with: CRNA  Anesthesia Plan Comments:         Anesthesia Quick Evaluation

## 2023-11-24 DIAGNOSIS — M5416 Radiculopathy, lumbar region: Secondary | ICD-10-CM | POA: Diagnosis not present

## 2023-11-30 ENCOUNTER — Ambulatory Visit (HOSPITAL_COMMUNITY): Payer: Self-pay | Admitting: Physician Assistant

## 2023-11-30 ENCOUNTER — Other Ambulatory Visit: Payer: Self-pay

## 2023-11-30 ENCOUNTER — Encounter (HOSPITAL_COMMUNITY)
Admission: RE | Disposition: A | Discharge status: Home / Self Care | Payer: Self-pay | Source: Home / Self Care | Attending: Orthopedic Surgery

## 2023-11-30 ENCOUNTER — Encounter (HOSPITAL_COMMUNITY): Payer: Self-pay | Admitting: Orthopedic Surgery

## 2023-11-30 ENCOUNTER — Observation Stay (HOSPITAL_COMMUNITY)
Admission: RE | Admit: 2023-11-30 | Discharge: 2023-12-01 | Disposition: A | Discharge status: Home / Self Care | Attending: Orthopedic Surgery | Admitting: Orthopedic Surgery

## 2023-11-30 ENCOUNTER — Ambulatory Visit (HOSPITAL_BASED_OUTPATIENT_CLINIC_OR_DEPARTMENT_OTHER): Admitting: Anesthesiology

## 2023-11-30 DIAGNOSIS — I10 Essential (primary) hypertension: Secondary | ICD-10-CM | POA: Diagnosis not present

## 2023-11-30 DIAGNOSIS — M1711 Unilateral primary osteoarthritis, right knee: Principal | ICD-10-CM | POA: Insufficient documentation

## 2023-11-30 DIAGNOSIS — E039 Hypothyroidism, unspecified: Secondary | ICD-10-CM

## 2023-11-30 DIAGNOSIS — I4891 Unspecified atrial fibrillation: Secondary | ICD-10-CM

## 2023-11-30 DIAGNOSIS — M179 Osteoarthritis of knee, unspecified: Principal | ICD-10-CM | POA: Diagnosis present

## 2023-11-30 DIAGNOSIS — Z7982 Long term (current) use of aspirin: Secondary | ICD-10-CM | POA: Diagnosis not present

## 2023-11-30 DIAGNOSIS — G8918 Other acute postprocedural pain: Secondary | ICD-10-CM | POA: Diagnosis not present

## 2023-11-30 HISTORY — PX: TOTAL KNEE ARTHROPLASTY: SHX125

## 2023-11-30 SURGERY — ARTHROPLASTY, KNEE, TOTAL
Anesthesia: Spinal | Site: Knee | Laterality: Right

## 2023-11-30 MED ORDER — BENZONATATE 100 MG PO CAPS: 100.0000 mg | ORAL_CAPSULE | Freq: Every day | ORAL | Status: DC | PRN

## 2023-11-30 MED ORDER — PROPOFOL 10 MG/ML IV BOLUS
INTRAVENOUS | Status: AC
Start: 2023-11-30 — End: 2023-11-30
  Filled 2023-11-30: qty 20

## 2023-11-30 MED ORDER — FENTANYL CITRATE PF 50 MCG/ML IJ SOSY
25.0000 ug | PREFILLED_SYRINGE | INTRAMUSCULAR | Status: DC | PRN
  Administered 2023-11-30: 25 ug via INTRAVENOUS
  Administered 2023-11-30 (×2): 50 ug via INTRAVENOUS

## 2023-11-30 MED ORDER — FENTANYL CITRATE (PF) 100 MCG/2ML IJ SOLN
INTRAMUSCULAR | Status: AC
  Filled 2023-11-30: qty 2

## 2023-11-30 MED ORDER — PREGABALIN 75 MG PO CAPS: 75.0000 mg | ORAL_CAPSULE | Freq: Every day | ORAL | Status: DC

## 2023-11-30 MED ORDER — RIVAROXABAN 10 MG PO TABS
10.0000 mg | ORAL_TABLET | Freq: Every day | ORAL | Status: DC
  Administered 2023-12-01: 10 mg via ORAL
  Filled 2023-11-30: qty 1

## 2023-11-30 MED ORDER — FLEET ENEMA RE ENEM: 1.0000 | ENEMA | Freq: Once | RECTAL | Status: DC | PRN

## 2023-11-30 MED ORDER — SODIUM CHLORIDE (PF) 0.9 % IJ SOLN
INTRAMUSCULAR | Status: AC
  Filled 2023-11-30: qty 50

## 2023-11-30 MED ORDER — SODIUM CHLORIDE (PF) 0.9 % IJ SOLN
INTRAMUSCULAR | Status: AC
  Filled 2023-11-30: qty 10

## 2023-11-30 MED ORDER — ACETAMINOPHEN 500 MG PO TABS
1000.0000 mg | ORAL_TABLET | Freq: Four times a day (QID) | ORAL | Status: DC
Start: 2023-11-30 — End: 2023-12-01
  Administered 2023-11-30 – 2023-12-01 (×2): 1000 mg via ORAL
  Filled 2023-11-30 (×3): qty 2

## 2023-11-30 MED ORDER — THYROID 60 MG PO TABS: 60.0000 mg | ORAL_TABLET | Freq: Every day | ORAL | Status: DC

## 2023-11-30 MED ORDER — POTASSIUM CHLORIDE CRYS ER 20 MEQ PO TBCR
20.0000 meq | EXTENDED_RELEASE_TABLET | Freq: Every day | ORAL | Status: DC
  Filled 2023-11-30: qty 1

## 2023-11-30 MED ORDER — FENTANYL CITRATE PF 50 MCG/ML IJ SOSY
PREFILLED_SYRINGE | INTRAMUSCULAR | Status: AC
  Filled 2023-11-30: qty 1

## 2023-11-30 MED ORDER — HYDROMORPHONE HCL 2 MG PO TABS
2.0000 mg | ORAL_TABLET | ORAL | Status: DC | PRN
  Administered 2023-11-30: 3 mg via ORAL
  Administered 2023-12-01: 2 mg via ORAL
  Filled 2023-11-30: qty 2
  Filled 2023-11-30: qty 1

## 2023-11-30 MED ORDER — DIPHENHYDRAMINE HCL 12.5 MG/5ML PO ELIX: 12.5000 mg | ORAL_SOLUTION | ORAL | Status: DC | PRN

## 2023-11-30 MED ORDER — DEXAMETHASONE SODIUM PHOSPHATE 10 MG/ML IJ SOLN
INTRAMUSCULAR | Status: AC
  Filled 2023-11-30: qty 1

## 2023-11-30 MED ORDER — DOCUSATE SODIUM 100 MG PO CAPS
100.0000 mg | ORAL_CAPSULE | Freq: Two times a day (BID) | ORAL | Status: DC
  Administered 2023-11-30 – 2023-12-01 (×2): 100 mg via ORAL
  Filled 2023-11-30 (×2): qty 1

## 2023-11-30 MED ORDER — SODIUM CHLORIDE 0.9 % IR SOLN
Status: DC | PRN
  Administered 2023-11-30: 1000 mL

## 2023-11-30 MED ORDER — PANTOPRAZOLE SODIUM 40 MG PO TBEC
40.0000 mg | DELAYED_RELEASE_TABLET | Freq: Every day | ORAL | Status: DC
  Filled 2023-11-30: qty 1

## 2023-11-30 MED ORDER — BUPIVACAINE LIPOSOME 1.3 % IJ SUSP
INTRAMUSCULAR | Status: AC
  Filled 2023-11-30: qty 20

## 2023-11-30 MED ORDER — TRAMADOL HCL 50 MG PO TABS
50.0000 mg | ORAL_TABLET | Freq: Four times a day (QID) | ORAL | Status: DC | PRN
  Administered 2023-11-30 – 2023-12-01 (×2): 100 mg via ORAL
  Filled 2023-11-30 (×2): qty 2

## 2023-11-30 MED ORDER — ONDANSETRON HCL 4 MG/2ML IJ SOLN: 4.0000 mg | Freq: Four times a day (QID) | INTRAMUSCULAR | Status: DC | PRN

## 2023-11-30 MED ORDER — TRANEXAMIC ACID-NACL 1000-0.7 MG/100ML-% IV SOLN
1000.0000 mg | INTRAVENOUS | Status: AC
  Administered 2023-11-30: 1000 mg via INTRAVENOUS
  Filled 2023-11-30: qty 100

## 2023-11-30 MED ORDER — 0.9 % SODIUM CHLORIDE (POUR BTL) OPTIME
TOPICAL | Status: DC | PRN
  Administered 2023-11-30: 1000 mL

## 2023-11-30 MED ORDER — METOCLOPRAMIDE HCL 5 MG PO TABS: 5.0000 mg | ORAL_TABLET | Freq: Three times a day (TID) | ORAL | Status: DC | PRN

## 2023-11-30 MED ORDER — ACETAMINOPHEN 10 MG/ML IV SOLN
1000.0000 mg | Freq: Four times a day (QID) | INTRAVENOUS | Status: DC
  Administered 2023-11-30: 1000 mg via INTRAVENOUS
  Filled 2023-11-30: qty 100

## 2023-11-30 MED ORDER — PREGABALIN 50 MG PO CAPS
50.0000 mg | ORAL_CAPSULE | Freq: Three times a day (TID) | ORAL | Status: DC
  Administered 2023-12-01: 50 mg via ORAL
  Filled 2023-11-30 (×2): qty 1

## 2023-11-30 MED ORDER — PHENOL 1.4 % MT LIQD: 1.0000 | OROMUCOSAL | Status: DC | PRN

## 2023-11-30 MED ORDER — METHOCARBAMOL 500 MG PO TABS
500.0000 mg | ORAL_TABLET | Freq: Four times a day (QID) | ORAL | Status: DC | PRN
  Administered 2023-11-30: 500 mg via ORAL
  Filled 2023-11-30: qty 1

## 2023-11-30 MED ORDER — FENTANYL CITRATE (PF) 100 MCG/2ML IJ SOLN
INTRAMUSCULAR | Status: DC | PRN
  Administered 2023-11-30: 50 ug via INTRAVENOUS

## 2023-11-30 MED ORDER — SODIUM CHLORIDE 0.9 % IV SOLN: INTRAVENOUS | Status: DC

## 2023-11-30 MED ORDER — MENTHOL 3 MG MT LOZG: 1.0000 | LOZENGE | OROMUCOSAL | Status: DC | PRN

## 2023-11-30 MED ORDER — AMISULPRIDE (ANTIEMETIC) 5 MG/2ML IV SOLN: 10.0000 mg | Freq: Once | INTRAVENOUS | Status: DC | PRN

## 2023-11-30 MED ORDER — BUPIVACAINE LIPOSOME 1.3 % IJ SUSP: 20.0000 mL | Freq: Once | INTRAMUSCULAR | Status: DC

## 2023-11-30 MED ORDER — PHENYLEPHRINE 80 MCG/ML (10ML) SYRINGE FOR IV PUSH (FOR BLOOD PRESSURE SUPPORT)
PREFILLED_SYRINGE | INTRAVENOUS | Status: AC
  Filled 2023-11-30: qty 10

## 2023-11-30 MED ORDER — ONDANSETRON HCL 4 MG/2ML IJ SOLN
INTRAMUSCULAR | Status: DC | PRN
  Administered 2023-11-30: 4 mg via INTRAVENOUS

## 2023-11-30 MED ORDER — VANCOMYCIN HCL IN DEXTROSE 1-5 GM/200ML-% IV SOLN
1000.0000 mg | Freq: Two times a day (BID) | INTRAVENOUS | Status: AC
  Administered 2023-11-30: 1000 mg via INTRAVENOUS
  Filled 2023-11-30: qty 200

## 2023-11-30 MED ORDER — THYROID 60 MG PO TABS
75.0000 mg | ORAL_TABLET | ORAL | Status: DC
  Filled 2023-11-30: qty 1

## 2023-11-30 MED ORDER — POVIDONE-IODINE 10 % EX SWAB
2.0000 | Freq: Once | CUTANEOUS | Status: DC
Start: 2023-11-30 — End: 2023-11-30

## 2023-11-30 MED ORDER — DEXAMETHASONE SODIUM PHOSPHATE 10 MG/ML IJ SOLN
8.0000 mg | Freq: Once | INTRAMUSCULAR | Status: AC
  Administered 2023-11-30: 8 mg via INTRAVENOUS

## 2023-11-30 MED ORDER — MIDAZOLAM HCL 2 MG/2ML IJ SOLN
INTRAMUSCULAR | Status: AC
  Filled 2023-11-30: qty 2

## 2023-11-30 MED ORDER — ROPIVACAINE HCL 5 MG/ML IJ SOLN
INTRAMUSCULAR | Status: DC | PRN
  Administered 2023-11-30: 20 mL via PERINEURAL

## 2023-11-30 MED ORDER — POLYETHYLENE GLYCOL 3350 17 G PO PACK: 17.0000 g | PACK | Freq: Every day | ORAL | Status: DC | PRN

## 2023-11-30 MED ORDER — PHENYLEPHRINE 80 MCG/ML (10ML) SYRINGE FOR IV PUSH (FOR BLOOD PRESSURE SUPPORT)
PREFILLED_SYRINGE | INTRAVENOUS | Status: DC | PRN
  Administered 2023-11-30 (×2): 160 ug via INTRAVENOUS

## 2023-11-30 MED ORDER — BISACODYL 10 MG RE SUPP: 10.0000 mg | Freq: Every day | RECTAL | Status: DC | PRN

## 2023-11-30 MED ORDER — DEXAMETHASONE SODIUM PHOSPHATE 10 MG/ML IJ SOLN
10.0000 mg | Freq: Once | INTRAMUSCULAR | Status: AC
  Administered 2023-12-01: 10 mg via INTRAVENOUS
  Filled 2023-11-30: qty 1

## 2023-11-30 MED ORDER — PROPOFOL 1000 MG/100ML IV EMUL
INTRAVENOUS | Status: AC
  Filled 2023-11-30: qty 100

## 2023-11-30 MED ORDER — LACTATED RINGERS IV SOLN: INTRAVENOUS | Status: DC

## 2023-11-30 MED ORDER — METOPROLOL SUCCINATE ER 25 MG PO TB24: 25.0000 mg | ORAL_TABLET | Freq: Every day | ORAL | Status: DC | PRN

## 2023-11-30 MED ORDER — STERILE WATER FOR IRRIGATION IR SOLN
Status: DC | PRN
  Administered 2023-11-30: 1000 mL

## 2023-11-30 MED ORDER — CHLORHEXIDINE GLUCONATE 0.12 % MT SOLN
15.0000 mL | Freq: Once | OROMUCOSAL | Status: AC
  Administered 2023-11-30: 15 mL via OROMUCOSAL

## 2023-11-30 MED ORDER — THYROID 30 MG PO TABS: 15.0000 mg | ORAL_TABLET | Freq: Every day | ORAL | Status: DC

## 2023-11-30 MED ORDER — ONDANSETRON HCL 4 MG PO TABS: 4.0000 mg | ORAL_TABLET | Freq: Four times a day (QID) | ORAL | Status: DC | PRN

## 2023-11-30 MED ORDER — HYDROMORPHONE HCL 2 MG PO TABS
1.0000 mg | ORAL_TABLET | ORAL | Status: DC | PRN
  Administered 2023-11-30: 2 mg via ORAL
  Filled 2023-11-30: qty 1

## 2023-11-30 MED ORDER — CLONIDINE HCL (ANALGESIA) 100 MCG/ML EP SOLN
EPIDURAL | Status: DC | PRN
Start: 2023-11-30 — End: 2023-11-30
  Administered 2023-11-30: 50 ug

## 2023-11-30 MED ORDER — FENTANYL CITRATE PF 50 MCG/ML IJ SOSY
PREFILLED_SYRINGE | INTRAMUSCULAR | Status: AC
  Filled 2023-11-30: qty 2

## 2023-11-30 MED ORDER — PROPOFOL 10 MG/ML IV BOLUS
INTRAVENOUS | Status: DC | PRN
  Administered 2023-11-30: 30 mg via INTRAVENOUS
  Administered 2023-11-30: 75 ug/kg/min via INTRAVENOUS

## 2023-11-30 MED ORDER — ACETAMINOPHEN 325 MG PO TABS: 325.0000 mg | ORAL_TABLET | Freq: Four times a day (QID) | ORAL | Status: DC | PRN

## 2023-11-30 MED ORDER — MELATONIN 5 MG PO TABS
5.0000 mg | ORAL_TABLET | Freq: Every evening | ORAL | Status: DC | PRN
  Administered 2023-11-30: 5 mg via ORAL
  Filled 2023-11-30: qty 1

## 2023-11-30 MED ORDER — SODIUM CHLORIDE (PF) 0.9 % IJ SOLN
INTRAMUSCULAR | Status: DC | PRN
  Administered 2023-11-30: 80 mL

## 2023-11-30 MED ORDER — THYROID 60 MG PO TABS: 60.0000 mg | ORAL_TABLET | ORAL | Status: DC

## 2023-11-30 MED ORDER — HYDROMORPHONE HCL 1 MG/ML IJ SOLN
0.5000 mg | INTRAMUSCULAR | Status: DC | PRN
  Administered 2023-12-01: 1 mg via INTRAVENOUS
  Filled 2023-11-30: qty 1

## 2023-11-30 MED ORDER — FENTANYL CITRATE PF 50 MCG/ML IJ SOSY
50.0000 ug | PREFILLED_SYRINGE | INTRAMUSCULAR | Status: DC
  Administered 2023-11-30: 50 ug via INTRAVENOUS
  Filled 2023-11-30: qty 2

## 2023-11-30 MED ORDER — MIDAZOLAM HCL 2 MG/2ML IJ SOLN
1.0000 mg | INTRAMUSCULAR | Status: DC
  Administered 2023-11-30: 0.5 mg via INTRAVENOUS
  Filled 2023-11-30: qty 2

## 2023-11-30 MED ORDER — ORAL CARE MOUTH RINSE: 15.0000 mL | Freq: Once | OROMUCOSAL | Status: AC

## 2023-11-30 MED ORDER — ONDANSETRON HCL 4 MG/2ML IJ SOLN
INTRAMUSCULAR | Status: AC
  Filled 2023-11-30: qty 2

## 2023-11-30 MED ORDER — METHOCARBAMOL 1000 MG/10ML IJ SOLN
500.0000 mg | Freq: Four times a day (QID) | INTRAMUSCULAR | Status: DC | PRN
  Administered 2023-11-30: 500 mg via INTRAVENOUS
  Filled 2023-11-30: qty 10

## 2023-11-30 MED ORDER — VANCOMYCIN HCL IN DEXTROSE 1-5 GM/200ML-% IV SOLN
1000.0000 mg | INTRAVENOUS | Status: AC
  Administered 2023-11-30: 1000 mg via INTRAVENOUS
  Filled 2023-11-30: qty 200

## 2023-11-30 MED ORDER — METOCLOPRAMIDE HCL 5 MG/ML IJ SOLN: 5.0000 mg | Freq: Three times a day (TID) | INTRAMUSCULAR | Status: DC | PRN

## 2023-11-30 SURGICAL SUPPLY — 45 items
ATTUNE PS FEM RT SZ 5 CEM KNEE (Femur) IMPLANT
ATTUNE PSRP INSE SZ5 7 KNEE (Insert) IMPLANT
BAG COUNTER SPONGE SURGICOUNT (BAG) IMPLANT
BAG ZIPLOCK 12X15 (MISCELLANEOUS) ×2 IMPLANT
BASE TIBIAL ROT PLAT SZ 5 KNEE (Knees) IMPLANT
BLADE SAG 18X100X1.27 (BLADE) ×2 IMPLANT
BLADE SAW SGTL 11.0X1.19X90.0M (BLADE) ×2 IMPLANT
BNDG ELASTIC 4X5.8 VLCR NS LF (GAUZE/BANDAGES/DRESSINGS) IMPLANT
BNDG ELASTIC 6INX 5YD STR LF (GAUZE/BANDAGES/DRESSINGS) ×2 IMPLANT
BOWL SMART MIX CTS (DISPOSABLE) ×2 IMPLANT
CEMENT HV SMART SET (Cement) ×4 IMPLANT
COVER SURGICAL LIGHT HANDLE (MISCELLANEOUS) ×2 IMPLANT
CUFF TRNQT CYL 34X4.125X (TOURNIQUET CUFF) ×2 IMPLANT
DERMABOND ADVANCED .7 DNX12 (GAUZE/BANDAGES/DRESSINGS) ×2 IMPLANT
DRAPE U-SHAPE 47X51 STRL (DRAPES) ×2 IMPLANT
DRESSING AQUACEL AG SP 3.5X10 (GAUZE/BANDAGES/DRESSINGS) IMPLANT
DRSG AQUACEL AG ADV 3.5X10 (GAUZE/BANDAGES/DRESSINGS) ×2 IMPLANT
DURAPREP 26ML APPLICATOR (WOUND CARE) ×2 IMPLANT
ELECT REM PT RETURN 15FT ADLT (MISCELLANEOUS) ×2 IMPLANT
GLOVE BIO SURGEON STRL SZ 6.5 (GLOVE) IMPLANT
GLOVE BIO SURGEON STRL SZ7 (GLOVE) IMPLANT
GLOVE BIO SURGEON STRL SZ8 (GLOVE) ×2 IMPLANT
GLOVE BIOGEL PI IND STRL 7.0 (GLOVE) ×2 IMPLANT
GLOVE BIOGEL PI IND STRL 8 (GLOVE) ×2 IMPLANT
GOWN STRL REUS W/ TWL LRG LVL3 (GOWN DISPOSABLE) ×2 IMPLANT
HOLDER FOLEY CATH W/STRAP (MISCELLANEOUS) ×2 IMPLANT
IMMOBILIZER KNEE 20 THIGH 36 (SOFTGOODS) ×2 IMPLANT
KIT TURNOVER KIT A (KITS) ×2 IMPLANT
MANIFOLD NEPTUNE II (INSTRUMENTS) ×2 IMPLANT
NS IRRIG 1000ML POUR BTL (IV SOLUTION) ×2 IMPLANT
PACK TOTAL KNEE CUSTOM (KITS) ×2 IMPLANT
PADDING CAST COTTON 6X4 STRL (CAST SUPPLIES) ×4 IMPLANT
PATELLA MEDIAL ATTUN 35MM KNEE (Knees) IMPLANT
PENCIL SMOKE EVACUATOR (MISCELLANEOUS) ×2 IMPLANT
PIN STEINMAN FIXATION KNEE (PIN) IMPLANT
PROTECTOR NERVE ULNAR (MISCELLANEOUS) ×2 IMPLANT
SET HNDPC FAN SPRY TIP SCT (DISPOSABLE) ×2 IMPLANT
SUT MNCRL AB 4-0 PS2 18 (SUTURE) ×2 IMPLANT
SUT VIC AB 2-0 CT1 TAPERPNT 27 (SUTURE) ×6 IMPLANT
SUTURE STRATFX 0 PDS 27 VIOLET (SUTURE) ×2 IMPLANT
TOWEL GREEN STERILE FF (TOWEL DISPOSABLE) ×2 IMPLANT
TRAY FOLEY MTR SLVR 16FR STAT (SET/KITS/TRAYS/PACK) ×2 IMPLANT
TUBE SUCTION HIGH CAP CLEAR NV (SUCTIONS) ×2 IMPLANT
WATER STERILE IRR 1000ML POUR (IV SOLUTION) ×4 IMPLANT
WRAP KNEE MAXI GEL POST OP (GAUZE/BANDAGES/DRESSINGS) ×2 IMPLANT

## 2023-11-30 NOTE — Discharge Instructions (Addendum)
 Frank Aluisio, MD Total Joint Specialist [facility] [address] [place], [place] [address] [phone]  TOTAL KNEE REPLACEMENT POSTOPERATIVE DIRECTIONS    Knee Rehabilitation, Guidelines Following Surgery  Results after knee surgery are often greatly improved when you follow the exercise, range of motion and muscle strengthening exercises prescribed by your doctor. Safety measures are also important to protect the knee from further injury. If any of these exercises cause you to have increased pain or swelling in your knee joint, decrease the amount until you are comfortable again and slowly increase them. If you have problems or questions, call your caregiver or physical therapist for advice.    BLOOD CLOT PREVENTION Take a 10 mg Xarelto once a day for three weeks following surgery. Then take an 81 mg Aspirin once a day for three weeks. Then discontinue Aspirin. You may resume your vitamins/supplements once you have discontinued the Xarelto. Do not take any NSAIDs (Advil, Aleve, Ibuprofen, Meloxicam, etc.) until you have discontinued the Xarelto.    HOME CARE INSTRUCTIONS  Remove items at home which could result in a fall. This includes throw rugs or furniture in walking pathways.  ICE to the affected knee as much as tolerated. Icing helps control swelling. If the swelling is well controlled you will be more comfortable and rehab easier. Continue to use ice on the knee for pain and swelling from surgery. You may notice swelling that will progress down to the foot and ankle. This is normal after surgery. Elevate the leg when you are not up walking on it.    Continue to use the breathing machine which will help keep your temperature down. It is common for your temperature to cycle up and down following surgery, especially at night when you are not up moving around and exerting yourself. The breathing machine keeps your lungs expanded and your temperature  down. Do not place pillow under the operative knee, focus on keeping the knee straight while resting  DIET You may resume your previous home diet once you are discharged from the hospital.  DRESSING / WOUND CARE / SHOWERING Keep your bulky bandage on for 2 days. On the third post-operative day you may remove the Ace bandage and gauze. There is a waterproof adhesive bandage on your skin which will stay in place until your first follow-up appointment. Once you remove this you will not need to place another bandage You may begin showering 3 days following surgery, but do not submerge the incision under water.  ACTIVITY For the first 5 days, the key is rest and control of pain and swelling Do your home exercises twice a day starting on post-operative day 3. On the days you go to physical therapy, just do the home exercises once that day. You should rest, ice and elevate the leg for 50 minutes out of every hour. Get up and walk/stretch for 10 minutes per hour. After 5 days you can increase your activity slowly as tolerated. Walk with your walker as instructed. Use the walker until you are comfortable transitioning to a cane. Walk with the cane in the opposite hand of the operative leg. You may discontinue the cane once you are comfortable and walking steadily. Avoid periods of inactivity such as sitting longer than an hour when not asleep. This helps prevent blood clots.  You may discontinue the knee immobilizer once you are able to perform a straight leg raise while lying down. You may resume a sexual relationship in one   month or when given the OK by your doctor.  You may return to work once you are cleared by your doctor.  Do not drive a car for 6 weeks or until released by your surgeon.  Do not drive while taking narcotics.  TED HOSE STOCKINGS Wear the elastic stockings on both legs for three weeks following surgery during the day. You may remove them at night for sleeping.  WEIGHT  BEARING Weight bearing as tolerated with assist device (walker, cane, etc) as directed, use it as long as suggested by your surgeon or therapist, typically at least 4-6 weeks.  POSTOPERATIVE CONSTIPATION PROTOCOL Constipation - defined medically as fewer than three stools per week and severe constipation as less than one stool per week.  One of the most common issues patients have following surgery is constipation.  Even if you have a regular bowel pattern at home, your normal regimen is likely to be disrupted due to multiple reasons following surgery.  Combination of anesthesia, postoperative narcotics, change in appetite and fluid intake all can affect your bowels.  In order to avoid complications following surgery, here are some recommendations in order to help you during your recovery period.  Colace (docusate) - Pick up an over-the-counter form of Colace or another stool softener and take twice a day as long as you are requiring postoperative pain medications.  Take with a full glass of water daily.  If you experience loose stools or diarrhea, hold the colace until you stool forms back up. If your symptoms do not get better within 1 week or if they get worse, check with your doctor. Dulcolax (bisacodyl) - Pick up over-the-counter and take as directed by the product packaging as needed to assist with the movement of your bowels.  Take with a full glass of water.  Use this product as needed if not relieved by Colace only.  MiraLax (polyethylene glycol) - Pick up over-the-counter to have on hand. MiraLax is a solution that will increase the amount of water in your bowels to assist with bowel movements.  Take as directed and can mix with a glass of water, juice, soda, coffee, or tea. Take if you go more than two days without a movement. Do not use MiraLax more than once per day. Call your doctor if you are still constipated or irregular after using this medication for 7 days in a row.  If you continue  to have problems with postoperative constipation, please contact the office for further assistance and recommendations.  If you experience "the worst abdominal pain ever" or develop nausea or vomiting, please contact the office immediatly for further recommendations for treatment.  ITCHING If you experience itching with your medications, try taking only a single pain pill, or even half a pain pill at a time.  You can also use Benadryl over the counter for itching or also to help with sleep.   MEDICATIONS See your medication summary on the "After Visit Summary" that the nursing staff will review with you prior to discharge.  You may have some home medications which will be placed on hold until you complete the course of blood thinner medication.  It is important for you to complete the blood thinner medication as prescribed by your surgeon.  Continue your approved medications as instructed at time of discharge.  PRECAUTIONS If you experience chest pain or shortness of breath - call 911 immediately for transfer to the hospital emergency department.  If you develop a fever greater that 101   F, purulent drainage from wound, increased redness or drainage from wound, foul odor from the wound/dressing, or calf pain - CONTACT YOUR SURGEON.                                                   FOLLOW-UP APPOINTMENTS Make sure you keep all of your appointments after your operation with your surgeon and caregivers. You should call the office at the above phone number and make an appointment for approximately two weeks after the date of your surgery or on the date instructed by your surgeon outlined in the "After Visit Summary".  RANGE OF MOTION AND STRENGTHENING EXERCISES  Rehabilitation of the knee is important following a knee injury or an operation. After just a few days of immobilization, the muscles of the thigh which control the knee become weakened and shrink (atrophy). Knee exercises are designed to build up  the tone and strength of the thigh muscles and to improve knee motion. Often times heat used for twenty to thirty minutes before working out will loosen up your tissues and help with improving the range of motion but do not use heat for the first two weeks following surgery. These exercises can be done on a training (exercise) mat, on the floor, on a table or on a bed. Use what ever works the best and is most comfortable for you Knee exercises include:  Leg Lifts - While your knee is still immobilized in a splint or cast, you can do straight leg raises. Lift the leg to 60 degrees, hold for 3 sec, and slowly lower the leg. Repeat 10-20 times 2-3 times daily. Perform this exercise against resistance later as your knee gets better.  Quad and Hamstring Sets - Tighten up the muscle on the front of the thigh (Quad) and hold for 5-10 sec. Repeat this 10-20 times hourly. Hamstring sets are done by pushing the foot backward against an object and holding for 5-10 sec. Repeat as with quad sets.  Leg Slides: Lying on your back, slowly slide your foot toward your buttocks, bending your knee up off the floor (only go as far as is comfortable). Then slowly slide your foot back down until your leg is flat on the floor again. Angel Wings: Lying on your back spread your legs to the side as far apart as you can without causing discomfort.  A rehabilitation program following serious knee injuries can speed recovery and prevent re-injury in the future due to weakened muscles. Contact your doctor or a physical therapist for more information on knee rehabilitation.   POST-OPERATIVE OPIOID TAPER INSTRUCTIONS: It is important to wean off of your opioid medication as soon as possible. If you do not need pain medication after your surgery it is ok to stop day one. Opioids include: Codeine, Hydrocodone(Norco, Vicodin), Oxycodone(Percocet, oxycontin) and hydromorphone amongst others.  Long term and even short term use of opiods can  cause: Increased pain response Dependence Constipation Depression Respiratory depression And more.  Withdrawal symptoms can include Flu like symptoms Nausea, vomiting And more Techniques to manage these symptoms Hydrate well Eat regular healthy meals Stay active Use relaxation techniques(deep breathing, meditating, yoga) Do Not substitute Alcohol to help with tapering If you have been on opioids for less than two weeks and do not have pain than it is ok to stop all together.    Plan to wean off of opioids This plan should start within one week post op of your joint replacement. Maintain the same interval or time between taking each dose and first decrease the dose.  Cut the total daily intake of opioids by one tablet each day Next start to increase the time between doses. The last dose that should be eliminated is the evening dose.   IF YOU ARE TRANSFERRED TO A SKILLED REHAB FACILITY If the patient is transferred to a skilled rehab facility following release from the hospital, a list of the current medications will be sent to the facility for the patient to continue.  When discharged from the skilled rehab facility, please have the facility set up the patient's Home Health Physical Therapy prior to being released. Also, the skilled facility will be responsible for providing the patient with their medications at time of release from the facility to include their pain medication, the muscle relaxants, and their blood thinner medication. If the patient is still at the rehab facility at time of the two week follow up appointment, the skilled rehab facility will also need to assist the patient in arranging follow up appointment in our office and any transportation needs.  MAKE SURE YOU:  Understand these instructions.  Get help right away if you are not doing well or get worse.   DENTAL ANTIBIOTICS:  In most cases prophylactic antibiotics for Dental procdeures after total joint surgery are  not necessary.  Exceptions are as follows:  1. History of prior total joint infection  2. Severely immunocompromised (Organ Transplant, cancer chemotherapy, Rheumatoid biologic meds such as Humera)  3. Poorly controlled diabetes (A1C &gt; 8.0, blood glucose over 200)  If you have one of these conditions, contact your surgeon for an antibiotic prescription, prior to your dental procedure.    Pick up stool softner and laxative for home use following surgery while on pain medications. Do not submerge incision under water. Please use good hand washing techniques while changing dressing each day. May shower starting three days after surgery. Please use a clean towel to pat the incision dry following showers. Continue to use ice for pain and swelling after surgery. Do not use any lotions or creams on the incision until instructed by your surgeon.      Information on my medicine - XARELTO (Rivaroxaban)  This medication education was reviewed with me or my healthcare representative as part of my discharge preparation.    Why was Xarelto prescribed for you? Xarelto was prescribed for you to reduce the risk of blood clots forming after orthopedic surgery. The medical term for these abnormal blood clots is venous thromboembolism (VTE).  What do you need to know about xarelto ? Take your Xarelto ONCE DAILY at the same time every day. You may take it either with or without food.  If you have difficulty swallowing the tablet whole, you may crush it and mix in applesauce just prior to taking your dose.  Take Xarelto exactly as prescribed by your doctor and DO NOT stop taking Xarelto without talking to the doctor who prescribed the medication.  Stopping without other VTE prevention medication to take the place of Xarelto may increase your risk of developing a clot.  After discharge, you should have regular check-up appointments with your healthcare provider that is prescribing your  Xarelto.    What do you do if you miss a dose? If you miss a dose, take it as soon as you remember on the same   day then continue your regularly scheduled once daily regimen the next day. Do not take two doses of Xarelto on the same day.   Important Safety Information A possible side effect of Xarelto is bleeding. You should call your healthcare provider right away if you experience any of the following: Bleeding from an injury or your nose that does not stop. Unusual colored urine (red or dark brown) or unusual colored stools (red or black). Unusual bruising for unknown reasons. A serious fall or if you hit your head (even if there is no bleeding).  Some medicines may interact with Xarelto and might increase your risk of bleeding while on Xarelto. To help avoid this, consult your healthcare provider or pharmacist prior to using any new prescription or non-prescription medications, including herbals, vitamins, non-steroidal anti-inflammatory drugs (NSAIDs) and supplements.  This website has more information on Xarelto: www.xarelto.com.   

## 2023-11-30 NOTE — Anesthesia Procedure Notes (Signed)
 Anesthesia Regional Block: Adductor canal block   Pre-Anesthetic Checklist: , timeout performed,  Correct Patient, Correct Site, Correct Laterality,  Correct Procedure, Correct Position, site marked,  Risks and benefits discussed,  Surgical consent,  Pre-op evaluation,  At surgeon's request and post-op pain management  Laterality: Right  Prep: chloraprep       Needles:  Injection technique: Single-shot  Needle Type: Echogenic Needle     Needle Length: 9cm  Needle Gauge: 21     Additional Needles:   Procedures:,,,, ultrasound used (permanent image in chart),,    Narrative:  Start time: 11/30/2023 9:45 AM End time: 11/30/2023 9:50 AM Injection made incrementally with aspirations every 5 mL.  Performed by: Personally  Anesthesiologist: Epifanio Charleston, MD

## 2023-11-30 NOTE — Op Note (Signed)
 OPERATIVE REPORT-TOTAL KNEE ARTHROPLASTY   Pre-operative diagnosis- Osteoarthritis  Right knee(s)  Post-operative diagnosis- Osteoarthritis Right knee(s)  Procedure-  Right  Total Knee Arthroplasty  Surgeon- Dempsey GAILS. Rashid Whitenight, MD  Assistant- Zelda Kobs, PA-C   Anesthesia-  GA combined with regional for post-op pain  EBL- 25 ml   Drains None  Tourniquet time- 30 minutes @ 300 mm Hg  Complications- None  Condition-PACU - hemodynamically stable.   Brief Clinical Note  Lori [name] is a 78 y.o. year old female with end stage OA of her right knee with progressively worsening pain and dysfunction. She has constant pain, with activity and at rest and significant functional deficits with difficulties even with ADLs. She has had extensive non-op management including analgesics, injections of cortisone and viscosupplements, and home exercise program, but remains in significant pain with significant dysfunction.Radiographs show bone on bone arthritis lateral and patellofemoral. She presents now for right Total Knee Arthroplasty.     Procedure in detail---   The patient is brought into the operating room and positioned supine on the operating table. After successful administration of  GA combined with regional for post-op pain,   a tourniquet is placed high on the  Right thigh(s) and the lower extremity is prepped and draped in the usual sterile fashion. Time out is performed by the operating team and then the  Right lower extremity is wrapped in Esmarch, knee flexed and the tourniquet inflated to 300 mmHg.       A midline incision is made with a ten blade through the subcutaneous tissue to the level of the extensor mechanism. A fresh blade is used to make a medial parapatellar arthrotomy. Soft tissue over the proximal medial tibia is subperiosteally elevated to the joint line with a knife and into the semimembranosus bursa with a Cobb elevator. Soft tissue over the proximal lateral  tibia is elevated with attention being paid to avoiding the patellar tendon on the tibial tubercle. The patella is everted, knee flexed 90 degrees and the ACL and PCL are removed. Findings are bone on bone lateral and patellofemoral with marginal osteophytes        The drill is used to create a starting hole in the distal femur and the canal is thoroughly irrigated with sterile saline to remove the fatty contents. The 5 degree Right  valgus alignment guide is placed into the femoral canal and the distal femoral cutting block is pinned to remove 10 mm off the distal femur. Resection is made with an oscillating saw.      The tibia is subluxed forward and the menisci are removed. The extramedullary alignment guide is placed referencing proximally at the medial aspect of the tibial tubercle and distally along the second metatarsal axis and tibial crest. The block is pinned to remove 2mm off the more deficient lateral  side. Resection is made with an oscillating saw. Size 5is the most appropriate size for the tibia and the proximal tibia is prepared with the modular drill and keel punch for that size.      The femoral sizing guide is placed and size 5 is most appropriate. Rotation is marked off the epicondylar axis and confirmed by creating a rectangular flexion gap at 90 degrees. The size 5 cutting block is pinned in this rotation and the anterior, posterior and chamfer cuts are made with the oscillating saw. The intercondylar block is then placed and that cut is made.      Trial size 5 tibial component,  trial size 5 posterior stabilized femur and a 7 mm posterior stabilized rotating platform insert trial is placed. Full extension is achieved with excellent varus/valgus and anterior/posterior balance throughout full range of motion. The patella is everted and thickness measured to be 21  mm. Free hand resection is taken to 12 mm, a 35 template is placed, lug holes are drilled, trial patella is placed, and it tracks  normally. Osteophytes are removed off the posterior femur with the trial in place. All trials are removed and the cut bone surfaces prepared with pulsatile lavage. Cement is mixed and once ready for implantation, the size 5 tibial implant, size  5 posterior stabilized femoral component, and the size 35 patella are cemented in place and the patella is held with the clamp. The trial insert is placed and the knee held in full extension. The Exparel  (20 ml mixed with 60 ml saline) is injected into the extensor mechanism, posterior capsule, medial and lateral gutters and subcutaneous tissues.  All extruded cement is removed and once the cement is hard the permanent 7 mm posterior stabilized rotating platform insert is placed into the tibial tray.      The wound is copiously irrigated with saline solution and the extensor mechanism closed with # 0 Stratofix suture. The tourniquet is released for a total tourniquet time of 30  minutes. Flexion against gravity is 140 degrees and the patella tracks normally. Subcutaneous tissue is closed with 2.0 vicryl and subcuticular with running 4.0 Monocryl. The incision is cleaned and dried and steri-strips and a bulky sterile dressing are applied. The limb is placed into a knee immobilizer and the patient is awakened and transported to recovery in stable condition.      Please note that a surgical assistant was a medical necessity for this procedure in order to perform it in a safe and expeditious manner. Surgical assistant was necessary to retract the ligaments and vital neurovascular structures to prevent injury to them and also necessary for proper positioning of the limb to allow for anatomic placement of the prosthesis.   Dempsey ROCKFORD Shahir Karen, MD    11/30/2023, 11:23 AM

## 2023-11-30 NOTE — Transfer of Care (Signed)
 Immediate Anesthesia Transfer of Care Note  Patient: Lori [name]  Procedure(s) Performed: ARTHROPLASTY, KNEE, TOTAL (Right: Knee)  Patient Location: PACU  Anesthesia Type:GA combined with regional for post-op pain  Level of Consciousness: drowsy and patient cooperative  Airway & Oxygen Therapy: Patient Spontanous Breathing and Patient connected to face mask oxygen  Post-op Assessment: Report given to RN and Post -op Vital signs reviewed and stable  Post vital signs: Reviewed and stable  Last Vitals:  Vitals Value Taken Time  BP 155/81 11/30/23 11:51  Temp    Pulse 60 11/30/23 11:52  Resp 9 11/30/23 11:52  SpO2 100 % 11/30/23 11:52  Vitals shown include unfiled device data.  Last Pain:  Vitals:   11/30/23 0957  TempSrc:   PainSc: 0-No pain         Complications: No notable events documented.

## 2023-11-30 NOTE — Interval H&P Note (Signed)
 History and Physical Interval Note:  11/30/2023 8:25 AM  Lori [name]  has presented today for surgery, with the diagnosis of Right knee osteoarthritisi.  The various methods of treatment have been discussed with the patient and family. After consideration of risks, benefits and other options for treatment, the patient has consented to  Procedure(s): ARTHROPLASTY, KNEE, TOTAL (Right) as a surgical intervention.  The patient's history has been reviewed, patient examined, no change in status, stable for surgery.  I have reviewed the patient's chart and labs.  Questions were answered to the patient's satisfaction.     Dempsey [name]

## 2023-11-30 NOTE — Care Plan (Signed)
 Ortho Bundle Case Management Note  Patient Details  Name: Lori [name] MRN: [id number] Date of Birth: [birth year]  R TKA on 11/30/23  DCP: Home with husband DME: No needs. Has RW PT: EO                   DME Arranged:  N/A DME Agency:  NA  HH Arranged:    HH Agency:     Additional Comments: Please contact me with any questions of if this plan should need to change.  Lyle Pepper, CCM [facility] [phone]   11/30/2023, 8:00 AM

## 2023-11-30 NOTE — Progress Notes (Signed)
 Orthopedic Tech Progress Note Patient Details:  Maddyson Brown Wanamaker 10/23/1945 [id number]  Patient ID: Deerica Brown Welborn, female   DOB: Jun 07, 1945, 78 y.o.   MRN: [id number] Cpm removed by 3W staff. Laymon DELENA Munroe 11/30/2023, 4:34 PM

## 2023-11-30 NOTE — Progress Notes (Signed)
 Orthopedic Tech Progress Note Patient Details:  Lori [name] 1946/05/14 [id number]  CPM Right Knee CPM Right Knee: On Right Knee Flexion (Degrees): 40 Right Knee Extension (Degrees): 10  Post Interventions Patient Tolerated: Well  Lori [name] 11/30/2023, 12:26 PM

## 2023-11-30 NOTE — Anesthesia Postprocedure Evaluation (Signed)
 Anesthesia Post Note  Patient: Lori [name]  Procedure(s) Performed: ARTHROPLASTY, KNEE, TOTAL (Right: Knee)     Patient location during evaluation: PACU Anesthesia Type: General Level of consciousness: awake and alert Pain management: pain level controlled Vital Signs Assessment: post-procedure vital signs reviewed and stable Respiratory status: spontaneous breathing, nonlabored ventilation, respiratory function stable and patient connected to nasal cannula oxygen Cardiovascular status: blood pressure returned to baseline and stable Postop Assessment: no apparent nausea or vomiting Anesthetic complications: no   No notable events documented.  Last Vitals:  Vitals:   11/30/23 1457 11/30/23 1728  BP: 138/75 101/73  Pulse: (!) 53 (!) 55  Resp: 16 18  Temp: (!) 36.3 C   SpO2: 100% 99%    Last Pain:  Vitals:   11/30/23 1835  TempSrc:   PainSc: Harlow Epifanio Lamar FORBES

## 2023-11-30 NOTE — Evaluation (Signed)
 Physical Therapy Evaluation Patient Details Name: Lori [name] MRN: [id number] DOB: July 11, 1945 Today's Date: 11/30/2023  History of Present Illness  78 yo female s/p R TKA  on 11/30/23. PMH: lupus, RA, HTN  Clinical Impression  Pt is s/p TKA resulting in the deficits listed below (see PT Problem List).  Pt  amb ~ 84' with RW and CGA for safety. Anticipate steady progress in acute setting.  Pt with ~ 15 degree quad lag,  KI used this session for improved knee stability and safety  Pt will benefit from acute skilled PT to increase their independence and safety with mobility to allow discharge.          If plan is discharge home, recommend the following: A little help with walking and/or transfers;A little help with bathing/dressing/bathroom;Help with stairs or ramp for entrance;Assist for transportation   Can travel by private vehicle        Equipment Recommendations BSC/3in1 (pt requests [facility])  Recommendations for Other Services       Functional Status Assessment Patient has had a recent decline in their functional status and demonstrates the ability to make significant improvements in function in a reasonable and predictable amount of time.     Precautions / Restrictions Precautions Precautions: Fall;Knee Required Braces or Orthoses: Knee Immobilizer - Right Knee Immobilizer - Right: Discontinue once straight leg raise with < 10 degree lag Restrictions Weight Bearing Restrictions Per Provider Order: No Other Position/Activity Restrictions: WBAT      Mobility  Bed Mobility Overal bed mobility: Needs Assistance Bed Mobility: Supine to Sit     Supine to sit: Min assist     General bed mobility comments: incr time and effort, light assist for RLE    Transfers Overall transfer level: Needs assistance Equipment used: Rolling walker (2 wheels) Transfers: Sit to/from Stand, Bed to chair/wheelchair/BSC Sit to Stand: Min assist   Step pivot transfers: Min assist,  Contact guard assist       General transfer comment: cues for hand placement and RLE position. assist to steady for stand  step pivot, bed to [facility]    Ambulation/Gait Ambulation/Gait assistance: Contact guard assist Gait Distance (Feet): 45 Feet Assistive device: Rolling walker (2 wheels) Gait Pattern/deviations: Step-to pattern, Decreased stance time - right       General Gait Details: cues for sequence, RW position,  use of UEs to offload RLE for pain control  Stairs            Wheelchair Mobility     Tilt Bed    Modified Rankin (Stroke Patients Only)       Balance Overall balance assessment: Mild deficits observed, not formally tested                                           Pertinent Vitals/Pain Pain Assessment Pain Assessment: 0-10 Pain Score: 3  Pain Location: right knee Pain Descriptors / Indicators: Sore Pain Intervention(s): Limited activity within patient's tolerance, Monitored during session, Premedicated before session, Ice applied    Home Living Family/patient expects to be discharged to:: Private residence Living Arrangements: Spouse/significant other Available Help at Discharge: Family Type of Home: Apartment Home Access: Level entry       Home Layout: One level Home Equipment: Agricultural consultant (2 wheels) Additional Comments: [place]illon    Prior Function Prior Level of Function : Independent/Modified Independent  Extremity/Trunk Assessment   Upper Extremity Assessment Upper Extremity Assessment: Overall WFL for tasks assessed    Lower Extremity Assessment Lower Extremity Assessment: RLE deficits/detail RLE Deficits / Details: knee extension with  ~ 10 to 15  degree quad lag, anticipated post op deficits  ankle [facility]       Communication   Communication Communication: No apparent difficulties    Cognition Arousal: Alert Behavior During Therapy: WFL for tasks assessed/performed   PT -  Cognitive impairments: No apparent impairments                         Following commands: Intact       Cueing Cueing Techniques: Verbal cues     General Comments      Exercises     Assessment/Plan    PT Assessment Patient needs continued PT services  PT Problem List Decreased strength;Decreased range of motion;Decreased activity tolerance;Decreased balance;Pain;Decreased knowledge of use of DME;Decreased mobility       PT Treatment Interventions DME instruction;Therapeutic exercise;Gait training;Functional mobility training;Therapeutic activities;Patient/family education    PT Goals (Current goals can be found in the Care Plan section)  Acute Rehab PT Goals PT Goal Formulation: With patient Time For Goal Achievement: 12/07/23 Potential to Achieve Goals: Good    Frequency 7X/week     Co-evaluation               AM-PAC PT 6 Clicks Mobility  Outcome Measure Help needed turning from your back to your side while in a flat bed without using bedrails?: A Little Help needed moving from lying on your back to sitting on the side of a flat bed without using bedrails?: A Little Help needed moving to and from a bed to a chair (including a wheelchair)?: A Little Help needed standing up from a chair using your arms (e.g., wheelchair or bedside chair)?: A Little Help needed to walk in hospital room?: A Little Help needed climbing 3-5 steps with a railing? : A Lot 6 Click Score: 17    End of Session Equipment Utilized During Treatment: Gait belt;Right knee immobilizer Activity Tolerance: Patient tolerated treatment well Patient left: in chair;with family/visitor present;with chair alarm set Nurse Communication: Mobility status PT Visit Diagnosis: Other abnormalities of gait and mobility (R26.89)    Time: 8399-8374 PT Time Calculation (min) (ACUTE ONLY): 25 min   Charges:   PT Evaluation $PT Eval Low Complexity: 1 Low PT Treatments $Gait Training: 8-22  mins PT General Charges $$ ACUTE PT VISIT: 1 Visit         Teresha Hanks, PT  Acute Rehab Dept [facility]) [phone]  11/30/2023   [facility] 11/30/2023, 4:44 PM

## 2023-12-01 ENCOUNTER — Encounter (HOSPITAL_COMMUNITY): Payer: Self-pay | Admitting: Orthopedic Surgery

## 2023-12-01 DIAGNOSIS — M1711 Unilateral primary osteoarthritis, right knee: Secondary | ICD-10-CM | POA: Diagnosis not present

## 2023-12-01 LAB — BASIC METABOLIC PANEL WITH GFR
Anion gap: 9 (ref 5–15)
BUN: 18 mg/dL (ref 8–23)
CO2: 19 mmol/L — ABNORMAL LOW (ref 22–32)
Calcium: 9.1 mg/dL (ref 8.9–10.3)
Chloride: 107 mmol/L (ref 98–111)
Creatinine, Ser: 0.48 mg/dL (ref 0.44–1.00)
GFR, Estimated: >60 mL/min (ref >60)
Glucose, Bld: 144 mg/dL — ABNORMAL HIGH (ref 70–99)
Potassium: 4.2 mmol/L (ref 3.5–5.1)
Sodium: 135 mmol/L (ref 135–145)

## 2023-12-01 LAB — CBC
HCT: 40.8 % (ref 36.0–46.0)
Hemoglobin: 13.1 g/dL (ref 12.0–15.0)
MCH: 31.7 pg (ref 26.0–34.0)
MCHC: 32.1 g/dL (ref 30.0–36.0)
MCV: 98.8 fL (ref 80.0–100.0)
Platelets: 190 K/uL (ref 150–400)
RBC: 4.13 MIL/uL (ref 3.87–5.11)
RDW: 13.2 % (ref 11.5–15.5)
WBC: 11.4 K/uL — ABNORMAL HIGH (ref 4.0–10.5)
nRBC: 0 % (ref 0.0–0.2)

## 2023-12-01 MED ORDER — RIVAROXABAN 10 MG PO TABS: 10.0000 mg | ORAL_TABLET | Freq: Every day | ORAL | 0 refills | Status: DC

## 2023-12-01 MED ORDER — ONDANSETRON HCL 4 MG PO TABS: 4.0000 mg | ORAL_TABLET | Freq: Four times a day (QID) | ORAL | 0 refills | Status: AC | PRN

## 2023-12-01 MED ORDER — METHOCARBAMOL 500 MG PO TABS: 500.0000 mg | ORAL_TABLET | Freq: Four times a day (QID) | ORAL | 0 refills | Status: DC | PRN

## 2023-12-01 MED ORDER — SODIUM CHLORIDE 0.9 % IV BOLUS: 250.0000 mL | Freq: Once | INTRAVENOUS | Status: DC

## 2023-12-01 MED ORDER — TRAMADOL HCL 50 MG PO TABS: 50.0000 mg | ORAL_TABLET | Freq: Four times a day (QID) | ORAL | 0 refills | Status: DC | PRN

## 2023-12-01 MED ORDER — HYDROMORPHONE HCL 2 MG PO TABS: 2.0000 mg | ORAL_TABLET | Freq: Four times a day (QID) | ORAL | 0 refills | Status: DC | PRN

## 2023-12-01 NOTE — Progress Notes (Signed)
 Subjective: 1 Day Post-Op Procedure(s) (LRB): ARTHROPLASTY, KNEE, TOTAL (Right) Patient reports pain as mild.   Patient seen in rounds by Dr. Melodi. Patient is well, and has had no acute complaints or problems No issues overnight. Denies chest pain, SOB, or calf pain.  We will continue therapy today, ambulated 45' yesterday.   Objective: Vital signs in last 24 hours: Temp:  [97.4 F (36.3 C)-98 F (36.7 C)] 97.8 F (36.6 C) (07/08 0622) Pulse Rate:  [45-72] 72 (07/08 0622) Resp:  [8-23] 17 (07/08 0622) BP: (96-155)/(51-92) 111/58 (07/08 0622) SpO2:  [93 %-100 %] 100 % (07/08 0622)  Intake/Output from previous day:  Intake/Output Summary (Last 24 hours) at 12/01/2023 0900 Last data filed at 12/01/2023 0600 Gross per 24 hour  Intake 2083.89 ml  Output 330 ml  Net 1753.89 ml     Intake/Output this shift: No intake/output data recorded.  Labs: Recent Labs    12/01/23 0341  HGB 13.1   Recent Labs    12/01/23 0341  WBC 11.4*  RBC 4.13  HCT 40.8  PLT 190   Recent Labs    12/01/23 0341  NA 135  K 4.2  CL 107  CO2 19*  BUN 18  CREATININE 0.48  GLUCOSE 144*  CALCIUM 9.1   No results for input(s): LABPT, INR in the last 72 hours.  Exam: General - Patient is Alert and Oriented Extremity - Neurologically intact Neurovascular intact Sensation intact distally Dorsiflexion/Plantar flexion intact Dressing - dressing C/D/I Motor Function - intact, moving foot and toes well on exam.   Past Medical History:  Diagnosis Date   Autoimmune hepatitis (HCC)    Cancer (HCC) 06/2017   SQUAMOS CELL REMOVED FROM FOREHEAD   Chronic fatigue    Complication of anesthesia    Partial Hysterectomy-Severe pain that radiated to esophogaus, and 12 more episode    GERD (gastroesophageal reflux disease)    Hemochromatosis    History of kidney stones    Hormone disorder    Hypothyroidism    IBS (irritable bowel syndrome)    Lupus    Mixed connective tissue disease  (HCC)    Osteoporosis 07/2017   T score -2.7 stable from prior DEXA   Palpitations    Rheumatoid arthritis (HCC)     Assessment/Plan: 1 Day Post-Op Procedure(s) (LRB): ARTHROPLASTY, KNEE, TOTAL (Right) Principal Problem:   OA (osteoarthritis) of knee Active Problems:   Primary osteoarthritis of right knee  Estimated body mass index is 23.48 kg/m as calculated from the following:   Height as of this encounter: 5' 6.5 (1.689 m).   Weight as of this encounter: 67 kg. Advance diet Up with therapy D/C IV fluids   Patient's anticipated LOS is less than 2 midnights, meeting these requirements: - Lives within 1 hour of care - Has a competent adult at home to recover with post-op recover - NO history of  - Chronic pain requiring opiods  - Diabetes  - Coronary Artery Disease  - Heart failure  - Heart attack  - Stroke  - DVT/VTE  - Respiratory Failure/COPD  - Renal failure  - Anemia  - Advanced Liver disease     DVT Prophylaxis - Xarelto  Weight bearing as tolerated. Continue therapy.  Plan is to go Home after hospital stay. Plan for discharge later today if progresses with therapy and meeting goals. Scheduled for OPPT at [facility]. Follow-up in the office in 2 weeks.  The PDMP database was reviewed today prior to any opioid medications being  prescribed to this patient.  Roxie Mess, PA-C Orthopedic Surgery [phone] 12/01/2023, 9:00 AM

## 2023-12-01 NOTE — Plan of Care (Addendum)
  Problem: Clinical Measurements: Goal: Ability to maintain clinical measurements within normal limits will improve Outcome: Progressing Goal: Will remain free from infection Outcome: Progressing Goal: Diagnostic test results will improve Outcome: Progressing   Problem: Coping: Goal: Level of anxiety will decrease Outcome: Progressing   Problem: Elimination: Goal: Will not experience complications related to bowel motility Outcome: Progressing Goal: Will not experience complications related to urinary retention Outcome: Progressing   Problem: Pain Managment: Goal: General experience of comfort will improve and/or be controlled Outcome: Progressing   Problem: Safety: Goal: Ability to remain free from injury will improve Outcome: Progressing

## 2023-12-01 NOTE — TOC Transition Note (Signed)
 Transition of Care [facility]) - Discharge Note   Patient Details  Name: Lori [name] MRN: [id number] Date of Birth: 16-Dec-1945  Transition of Care [facility]) CM/SW Contact:  NORMAN ASPEN, LCSW Phone Number: 12/01/2023, 10:42 AM   Clinical Narrative:     Met with pt and spouse who confirm she has needed DME in the home.  OPPT already arranged with [facility].  No further TOC needs.  Final next level of care: OP Rehab Barriers to Discharge: No Barriers Identified   Patient Goals and CMS Choice Patient states their goals for this hospitalization and ongoing recovery are:: return home          Discharge Placement                       Discharge Plan and Services Additional resources added to the After Visit Summary for                  DME Arranged: N/A DME Agency: NA                  Social Drivers of Health (SDOH) Interventions SDOH Screenings   Food Insecurity: No Food Insecurity (11/30/2023)  Housing: Low Risk  (11/30/2023)  Transportation Needs: No Transportation Needs (11/30/2023)  Utilities: Not At Risk (11/30/2023)  Social Connections: Socially Integrated (11/30/2023)  Tobacco Use: Low Risk  (11/30/2023)     Readmission Risk Interventions     No data to display

## 2023-12-01 NOTE — Progress Notes (Signed)
 Discharge teaching complete. Meds, diet, activity, incision care reviewed and all questions answered. Copy of instructions given to patient and prescriptions sent to pharmacy.

## 2023-12-01 NOTE — Progress Notes (Signed)
 Physical Therapy Treatment Patient Details Name: Lori [name] MRN: [id number] DOB: 1945-11-06 Today's Date: 12/01/2023   History of Present Illness 78 yo female s/p R TKA  on 11/30/23. PMH: lupus, RA, HTN    PT Comments  Pt is making excellent progress today, meeting PT goals and is ready to d/c  home from PT standpoint with spouse assisting as needed. Reviewed mobility as below, knee precautions, use of ice. Pt husband present for entirety session.    If plan is discharge home, recommend the following: A little help with walking and/or transfers;A little help with bathing/dressing/bathroom;Help with stairs or ramp for entrance;Assist for transportation   Can travel by private vehicle        Equipment Recommendations  BSC/3in1    Recommendations for Other Services       Precautions / Restrictions Precautions Precautions: Fall;Knee Recall of Precautions/Restrictions: Impaired Precaution/Restrictions Comments: spouse present and able to recall knee precautions; no KI today--ind SLR, reviewed use and precautions if needed at home for sleeping/terminal knee extension Knee Immobilizer - Right: Discontinue once straight leg raise with < 10 degree lag Restrictions Weight Bearing Restrictions Per Provider Order: No Other Position/Activity Restrictions: WBAT     Mobility  Bed Mobility Overal bed mobility: Needs Assistance Bed Mobility: Supine to Sit, Sit to Supine     Supine to sit: Supervision Sit to supine: Supervision   General bed mobility comments: for safety, no physical assist    Transfers Overall transfer level: Needs assistance Equipment used: Rolling walker (2 wheels) Transfers: Sit to/from Stand Sit to Stand: Contact guard assist           General transfer comment: cues for hand placement and RLE position.    Ambulation/Gait Ambulation/Gait assistance: Contact guard assist, Supervision Gait Distance (Feet): 100 Feet Assistive device: Rolling walker  (2 wheels) Gait Pattern/deviations: Step-to pattern, Step-through pattern, Decreased stance time - right       General Gait Details: cues for sequence, RW position,  progression to step through without incr pain, good stability and no LOB   Stairs             Wheelchair Mobility     Tilt Bed    Modified Rankin (Stroke Patients Only)       Balance Overall balance assessment: Mild deficits observed, not formally tested                                          Communication Communication Communication: No apparent difficulties  Cognition Arousal: Alert Behavior During Therapy: WFL for tasks assessed/performed   PT - Cognitive impairments: No apparent impairments                       PT - Cognition Comments: occaisonal redirection Following commands: Intact      Cueing Cueing Techniques: Verbal cues  Exercises Total Joint Exercises Ankle Circles/Pumps: AROM, Both, 10 reps Quad Sets: AROM, Both, 10 reps Heel Slides: AROM, Right, 10 reps, AAROM Straight Leg Raises: AROM, AAROM, Strengthening, Right, 10 reps    General Comments        Pertinent Vitals/Pain Pain Assessment Pain Assessment: Faces Pain Score: 6  Pain Location: right knee and thigh ' Pain Descriptors / Indicators: Sore, Aching Pain Intervention(s): Limited activity within patient's tolerance, Monitored during session, Premedicated before session, Repositioned, Ice applied    Home Living  Prior Function            PT Goals (current goals can now be found in the care plan section) Acute Rehab PT Goals PT Goal Formulation: With patient Time For Goal Achievement: 12/07/23 Potential to Achieve Goals: Good Progress towards PT goals: Progressing toward goals    Frequency    7X/week      PT Plan      Co-evaluation              AM-PAC PT 6 Clicks Mobility   Outcome Measure  Help needed turning from your back  to your side while in a flat bed without using bedrails?: A Little Help needed moving from lying on your back to sitting on the side of a flat bed without using bedrails?: None Help needed moving to and from a bed to a chair (including a wheelchair)?: A Little Help needed standing up from a chair using your arms (e.g., wheelchair or bedside chair)?: A Little Help needed to walk in hospital room?: A Little Help needed climbing 3-5 steps with a railing? : A Little 6 Click Score: 19    End of Session Equipment Utilized During Treatment: Gait belt Activity Tolerance: Patient tolerated treatment well Patient left: with call bell/phone within reach;in bed;with bed alarm set;with family/visitor present   PT Visit Diagnosis: Other abnormalities of gait and mobility (R26.89)     Time: 9058-8979 PT Time Calculation (min) (ACUTE ONLY): 39 min  Charges:    $Gait Training: 8-22 mins $Therapeutic Exercise: 8-22 mins $Therapeutic Activity: 8-22 mins PT General Charges $$ ACUTE PT VISIT: 1 Visit                        [facility] 12/01/2023, 11:02 AM

## 2023-12-03 DIAGNOSIS — M25561 Pain in right knee: Secondary | ICD-10-CM | POA: Diagnosis not present

## 2023-12-05 ENCOUNTER — Other Ambulatory Visit: Payer: Self-pay

## 2023-12-05 ENCOUNTER — Emergency Department (HOSPITAL_BASED_OUTPATIENT_CLINIC_OR_DEPARTMENT_OTHER): Admission: EM | Admit: 2023-12-05 | Discharge: 2023-12-05 | Disposition: A | Discharge status: Home / Self Care

## 2023-12-05 ENCOUNTER — Encounter (HOSPITAL_BASED_OUTPATIENT_CLINIC_OR_DEPARTMENT_OTHER): Payer: Self-pay

## 2023-12-05 DIAGNOSIS — T148XXA Other injury of unspecified body region, initial encounter: Secondary | ICD-10-CM

## 2023-12-05 DIAGNOSIS — Z96651 Presence of right artificial knee joint: Secondary | ICD-10-CM | POA: Insufficient documentation

## 2023-12-05 DIAGNOSIS — X58XXXA Exposure to other specified factors, initial encounter: Secondary | ICD-10-CM | POA: Insufficient documentation

## 2023-12-05 DIAGNOSIS — S8991XA Unspecified injury of right lower leg, initial encounter: Secondary | ICD-10-CM | POA: Diagnosis present

## 2023-12-05 DIAGNOSIS — M7989 Other specified soft tissue disorders: Secondary | ICD-10-CM

## 2023-12-05 DIAGNOSIS — S8011XA Contusion of right lower leg, initial encounter: Secondary | ICD-10-CM | POA: Diagnosis not present

## 2023-12-05 DIAGNOSIS — R2241 Localized swelling, mass and lump, right lower limb: Secondary | ICD-10-CM | POA: Diagnosis not present

## 2023-12-05 NOTE — ED Provider Notes (Signed)
 [place] EMERGENCY DEPARTMENT AT [facility] Provider Note   CSN: [id number] Arrival date & time: 12/05/23  2139     Patient presents with: Leg Swelling   Lori [name] is a 78 y.o. female.   HPI    Presents because of concern for swelling in the right lower extremity.  Recent total knee replacement on July 7.  Patient's been walking around today quite a bit.  Noticing swelling of her right lower extremity to be more pronounced today than what it was the days prior.  Notice of bruising to the area as well.  No fever no chills.  No redness of her knee.  No discharge from the surgical site.  Patient states that she called the triage hotline for the orthopedic office who wanted to come to the ED to rule out DVT.  Patient denies all chest pain or shortness of breath.  No pleuritic chest pain or hemoptysis.  Patient states that she is currently taking Xarelto .  Taken 20 mg daily.  She was instructed to take this for the next 3 weeks.  Has not missed any doses.  Otherwise denies all complaints.   Previous medical history reviewed : Patient had total knee revision completed on July 7.   Prior to Admission medications   Medication Sig Start Date End Date Taking? Authorizing Provider  amiloride-hydrochlorothiazide (MODURETIC) 5-50 MG tablet Take 0.5-1 tablets by mouth daily. 11/21/16   [provider]  benzonatate  (TESSALON ) 100 MG capsule Take 100 mg by mouth daily as needed for cough.    [provider]  dextroamphetamine (DEXTROSTAT) 5 MG tablet Take 5 mg by mouth daily as needed (Chronic fatigue). 03/11/23   [provider]  Esomeprazole Magnesium (NEXIUM PO) Take 1 capsule by mouth at bedtime as needed (Heartburn).    [provider]  HYDROmorphone  (DILAUDID ) 2 MG tablet Take 1-2 tablets (2-4 mg total) by mouth every 6 (six) hours as needed for severe pain (pain score 7-10). 12/01/23   Edmisten, Kristie L, PA  hydroxychloroquine (PLAQUENIL)  200 MG tablet Take 200 mg by mouth daily. 02/27/23   [provider]  IVERMECTIN PO Take 12 mg by mouth daily.    [provider]  methocarbamol  (ROBAXIN ) 500 MG tablet Take 1 tablet (500 mg total) by mouth every 6 (six) hours as needed for muscle spasms. 12/01/23   Edmisten, Roxie CROME, PA  metoprolol  succinate (TOPROL -XL) 25 MG 24 hr tablet Take 25 mg by mouth daily as needed (Afib).    [provider]  ondansetron  (ZOFRAN ) 4 MG tablet Take 1 tablet (4 mg total) by mouth every 6 (six) hours as needed for nausea. 12/01/23   Edmisten, Kristie L, PA  potassium chloride  (KLOR-CON  M) 10 MEQ tablet Take 20 mEq by mouth daily. 03/09/14   [provider]  pregabalin  (LYRICA ) 25 MG capsule Take 50 mg by mouth 3 (three) times daily.    [provider]  Probiotic Product (PROBIOTIC DAILY PO) Take 1 capsule by mouth daily. [facility]    [provider]  Pseudoephedrine-Acetaminophen  (SM NON-ASPRIN SINUS PO) Take by mouth.    [provider]  rivaroxaban  (XARELTO ) 10 MG TABS tablet Take 1 tablet (10 mg total) by mouth daily with breakfast for 21 days. Then take one 81 mg aspirin once a day for three weeks. Then discontinue aspirin. 12/01/23 12/22/23  Edmisten, Roxie CROME, PA  thyroid  (ARMOUR) 15 MG tablet Take 15 mg by mouth See admin instructions. Only Monday-Friday  [provider]  thyroid  (ARMOUR) 60 MG tablet Take 60 mg by mouth daily before breakfast.    [provider]  traMADol  (ULTRAM ) 50 MG tablet Take 1-2 tablets (50-100 mg total) by mouth every 6 (six) hours as needed for moderate pain (pain score 4-6). 12/01/23   Edmisten, Kristie L, PA    Allergies: Amoxicillin, Codeine, Keflex [cephalexin], Eliquis  [apixaban ], Erythromycin, Iodinated contrast media, Meperidine, Metoclopramide  hcl, Metoprolol , Percocet [oxycodone -acetaminophen ], and Oxycodone     Review of Systems  Constitutional:  Negative for chills and fever.  HENT:   Negative for ear pain and sore throat.   Eyes:  Negative for pain and visual disturbance.  Respiratory:  Negative for cough and shortness of breath.   Cardiovascular:  Negative for chest pain and palpitations.  Gastrointestinal:  Negative for abdominal pain and vomiting.  Genitourinary:  Negative for dysuria and hematuria.  Musculoskeletal:  Negative for arthralgias and back pain.  Skin:  Negative for color change and rash.  Neurological:  Negative for seizures and syncope.  All other systems reviewed and are negative.   Updated Vital Signs BP (!) 144/113   Pulse 84   Temp 98 F (36.7 C) (Oral)   Resp 18   SpO2 100%   Physical Exam Vitals and nursing note reviewed.  Constitutional:      General: She is not in acute distress.    Appearance: She is well-developed.  HENT:     Head: Normocephalic and atraumatic.  Eyes:     Conjunctiva/sclera: Conjunctivae normal.  Cardiovascular:     Rate and Rhythm: Normal rate and regular rhythm.     Heart sounds: No murmur heard. Pulmonary:     Effort: Pulmonary effort is normal. No respiratory distress.     Breath sounds: Normal breath sounds.  Abdominal:     Palpations: Abdomen is soft.     Tenderness: There is no abdominal tenderness.  Musculoskeletal:        General: No swelling.     Cervical back: Neck supple.     Comments: Bruising to the right lower extremity.  Incision site is clean without any kind of erythema or discharge.  No cellulitic changes surrounding the right knee.  Has some old bruising layering in the right thigh as well as right lower extremity.  2+ dorsal pedal pulses and posterior tibial pulses.  Skin:    General: Skin is warm and dry.     Capillary Refill: Capillary refill takes less than 2 seconds.  Neurological:     Mental Status: She is alert.  Psychiatric:        Mood and Affect: Mood normal.     (all labs ordered are listed, but only abnormal results are displayed) Labs Reviewed - No data to  display  EKG: None  Radiology: No results found.   Procedures   Medications Ordered in the ED - No data to display                                  Medical Decision Making   Presents because of concern for swelling in the right lower extremity.  Recent total knee replacement on July 7.  Patient's been walking around today quite a bit.  Noticing swelling of her right lower extremity to be more pronounced today than what it was the days prior.  Notice of bruising to the area as well.  No fever no chills.  No redness  of her knee.  No discharge from the surgical site.  Patient states that she called the triage hotline for the orthopedic office who wanted to come to the ED to rule out DVT.  Patient denies all chest pain or shortness of breath.  No pleuritic chest pain or hemoptysis.  Patient states that she is currently taking Xarelto .  Taken 20 mg daily.  She was instructed to take this for the next 3 weeks.  Has not missed any doses.  Otherwise denies all complaints.   Previous medical history reviewed : Patient had total knee revision completed on July 7.   Exam as follows:Bruising to the right lower extremity.  Incision site is clean without any kind of erythema or discharge.  No cellulitic changes surrounding the right knee.  Has some old bruising layering in the right thigh as well as right lower extremity. 2+ dorsal pedal pulses and posterior tibial pulses.   Knee looks healthy.  No concerns for septic joint in this point time.  Neurovascular intact.  Sensation intact lower extremities.  2+ dorsal pedal and posterior tibial pulses.  No concerns for acute ischemia.  Compartments soft.  No concerns for compartment syndrome   Patient has been compliant with her coagulation.  Has been taken Xarelto  20 mg.  She does need a DVT ultrasound given some the swelling but I think most the swelling is related to the swelling from her knee and subsequently just moving distally with gravity.   Given the DVT ultrasound team is going, will schedule ultrasound for tomorrow.  Recommend the patient continue with her Xarelto .   Patient denies all chest pain or shortness of breath.  No tachypnea or tachycardia.  No pleuritic chest pain.  No hemoptysis.  No further workup needed for PE at this point time.   No indication for labs at this point time.  No concern for an infectious etiology  Patient to be discharged home with her continuing her Xarelto  the patient will come back tomorrow for DVT ultrasound         Final diagnoses:  Swelling of lower leg  Bruising    ED Discharge Orders          Ordered    Lower Ext Bilat Venous US        Comments: IMPORTANT PATIENT INSTRUCTIONS:  You have been scheduled for an Outpatient Ultrasound.    Your appointment has been scheduled for:  _______ am/pm on _______________ (date).  If your appointment is scheduled for a Saturday, Sunday or holiday, please go to the [employer] at [facility] Emergency Department Registration Desk at least 15 minutes prior to your appointment time and tell them you are there for an ultrasound.    If your appointment is scheduled for a weekday (Monday - Friday), please go directly to the [facility] at [facility] Radiology Department reception area at least 15 minutes prior to your appointment time and tell them you are there for an ultrasound.  Please call [phone] with questions.   12/05/23 2204               Simon Lavonia SAILOR, MD 12/05/23 2208

## 2023-12-05 NOTE — ED Notes (Signed)
 US  for 2pm tomorrow was schedule

## 2023-12-05 NOTE — ED Triage Notes (Signed)
 Pt reports she is here today due to right leg swelling. Pt reports she had knee surgery on Monday.Pt denies any sob, cp.

## 2023-12-05 NOTE — Discharge Instructions (Signed)
 Please come back tomorrow for the scheduled DVT ultrasound of your leg.  Please continue with your Xarelto .  Is very important that you do get this DVT ultrasound tomorrow to make sure you do not have a blood clot.  If you develop any kind of chest pain or shortness of breath or any kind of pain when taking a deep breath or coughing up any blood no please come back to the ED for further evaluation.

## 2023-12-06 ENCOUNTER — Inpatient Hospital Stay (HOSPITAL_BASED_OUTPATIENT_CLINIC_OR_DEPARTMENT_OTHER)
Admission: RE | Admit: 2023-12-06 | Discharge: 2023-12-06 | Discharge status: Home / Self Care | Source: Ambulatory Visit

## 2023-12-06 ENCOUNTER — Other Ambulatory Visit (HOSPITAL_BASED_OUTPATIENT_CLINIC_OR_DEPARTMENT_OTHER): Payer: Self-pay

## 2023-12-06 DIAGNOSIS — M7989 Other specified soft tissue disorders: Secondary | ICD-10-CM

## 2023-12-06 DIAGNOSIS — T148XXA Other injury of unspecified body region, initial encounter: Secondary | ICD-10-CM

## 2023-12-06 DIAGNOSIS — Z96651 Presence of right artificial knee joint: Secondary | ICD-10-CM | POA: Diagnosis not present

## 2023-12-06 DIAGNOSIS — Z471 Aftercare following joint replacement surgery: Secondary | ICD-10-CM | POA: Diagnosis not present

## 2023-12-07 DIAGNOSIS — M25561 Pain in right knee: Secondary | ICD-10-CM | POA: Diagnosis not present

## 2023-12-08 NOTE — Discharge Summary (Signed)
 Patient ID: Lori [name] MRN: [id number] DOB/AGE: 12/21/1945 78 y.o.  Admit date: 11/30/2023 Discharge date: 12/01/2023  Admission Diagnoses:  Principal Problem:   OA (osteoarthritis) of knee Active Problems:   Primary osteoarthritis of right knee   Discharge Diagnoses:  Same  Past Medical History:  Diagnosis Date   Autoimmune hepatitis (HCC)    Cancer (HCC) 06/2017   SQUAMOS CELL REMOVED FROM FOREHEAD   Chronic fatigue    Complication of anesthesia    Partial Hysterectomy-Severe pain that radiated to esophogaus, and 12 more episode    GERD (gastroesophageal reflux disease)    Hemochromatosis    History of kidney stones    Hormone disorder    Hypothyroidism    IBS (irritable bowel syndrome)    Lupus    Mixed connective tissue disease (HCC)    Osteoporosis 07/2017   T score -2.7 stable from prior DEXA   Palpitations    Rheumatoid arthritis (HCC)     Surgeries: Procedure(s): ARTHROPLASTY, KNEE, TOTAL on 11/30/2023   Consultants:   Discharged Condition: Improved  Hospital Course: Lori [name] is an 78 y.o. female who was admitted 11/30/2023 for operative treatment ofOA (osteoarthritis) of knee. Patient has severe unremitting pain that affects sleep, daily activities, and work/hobbies. After pre-op clearance the patient was taken to the operating room on 11/30/2023 and underwent  Procedure(s): ARTHROPLASTY, KNEE, TOTAL.    Patient was given perioperative antibiotics:  Anti-infectives (From admission, onward)    Start     Dose/Rate Route Frequency Ordered Stop   11/30/23 2100  vancomycin  (VANCOCIN ) IVPB 1000 mg/200 mL premix        1,000 mg 200 mL/hr over 60 Minutes Intravenous Every 12 hours 11/30/23 1500 12/01/23 0025   11/30/23 0800  vancomycin  (VANCOCIN ) IVPB 1000 mg/200 mL premix        1,000 mg 200 mL/hr over 60 Minutes Intravenous On call to O.R. 11/30/23 [id number] 11/30/23 0956        Patient was given sequential compression devices, early  ambulation, and chemoprophylaxis to prevent DVT.  Patient benefited maximally from hospital stay and there were no complications.    Recent vital signs: No data found.   Recent laboratory studies: No results for input(s): WBC, HGB, HCT, PLT, NA, K, CL, CO2, BUN, CREATININE, GLUCOSE, INR, CALCIUM in the last 72 hours.  Invalid input(s): PT, 2   Discharge Medications:   Allergies as of 12/01/2023       Reactions   Amoxicillin Nausea And Vomiting   Codeine Nausea Only   Keflex [cephalexin] Itching    No hives, it wasn't that bad   Eliquis  [apixaban ] Other (See Comments)   Blood in stool,joint pain dizziness   Erythromycin Nausea Only, Other (See Comments)   Iodinated Contrast Media Other (See Comments)   Meperidine    Pt stated, I don't know what this is; 04/27/17   Metoclopramide  Hcl    Metoprolol     Tired fatigue itching and hdizziness   Percocet [oxycodone -acetaminophen ]    Made pt sick    Oxycodone  Nausea And Vomiting   Pt stated, I do not know if I am allergic to this; 04/27/17        Medication List     STOP taking these medications    Advil 200 MG tablet Generic drug: ibuprofen   aspirin 325 MG tablet   COLLAGEN PO   fish oil-omega-3 fatty acids 1000 MG capsule   GLUTATHIONE PO   l-methylfolate-B6-B12 3-35-2 MG Tabs tablet Commonly known as:  METANX   MAGNESIUM PO   melatonin 5 MG Tabs   MULTIVITAMIN PO   NON FORMULARY   OVER THE COUNTER MEDICATION   OVER THE COUNTER MEDICATION   OVER THE COUNTER MEDICATION   OVER THE COUNTER MEDICATION   OVER THE COUNTER MEDICATION   PREGNENOLONE PO   PRESCRIPTION MEDICATION   vitamin C 1000 MG tablet   VITAMIN D  PO   zinc gluconate 50 MG tablet       TAKE these medications    amiloride-hydrochlorothiazide 5-50 MG tablet Commonly known as: MODURETIC Take 0.5-1 tablets by mouth daily.   benzonatate  100 MG capsule Commonly known as: TESSALON  Take 100 mg by  mouth daily as needed for cough.   dextroamphetamine 5 MG tablet Commonly known as: DEXTROSTAT Take 5 mg by mouth daily as needed (Chronic fatigue).   HYDROmorphone  2 MG tablet Commonly known as: DILAUDID  Take 1-2 tablets (2-4 mg total) by mouth every 6 (six) hours as needed for severe pain (pain score 7-10).   hydroxychloroquine 200 MG tablet Commonly known as: PLAQUENIL Take 200 mg by mouth daily.   IVERMECTIN PO Take 12 mg by mouth daily.   methocarbamol  500 MG tablet Commonly known as: ROBAXIN  Take 1 tablet (500 mg total) by mouth every 6 (six) hours as needed for muscle spasms.   metoprolol  succinate 25 MG 24 hr tablet Commonly known as: TOPROL -XL Take 25 mg by mouth daily as needed (Afib).   NEXIUM PO Take 1 capsule by mouth at bedtime as needed (Heartburn).   ondansetron  4 MG tablet Commonly known as: ZOFRAN  Take 1 tablet (4 mg total) by mouth every 6 (six) hours as needed for nausea.   potassium chloride  10 MEQ tablet Commonly known as: KLOR-CON  M Take 20 mEq by mouth daily.   pregabalin  25 MG capsule Commonly known as: LYRICA  Take 50 mg by mouth 3 (three) times daily.   PROBIOTIC DAILY PO Take 1 capsule by mouth daily. [facility]   rivaroxaban  10 MG Tabs tablet Commonly known as: XARELTO  Take 1 tablet (10 mg total) by mouth daily with breakfast for 21 days. Then take one 81 mg aspirin once a day for three weeks. Then discontinue aspirin. What changed:  medication strength how much to take when to take this additional instructions   SM NON-ASPRIN SINUS PO Take by mouth.   thyroid  60 MG tablet Commonly known as: ARMOUR Take 60 mg by mouth daily before breakfast.   thyroid  15 MG tablet Commonly known as: ARMOUR Take 15 mg by mouth See admin instructions. Only Monday-Friday   traMADol  50 MG tablet Commonly known as: ULTRAM  Take 1-2 tablets (50-100 mg total) by mouth every 6 (six) hours as needed for moderate pain (pain score 4-6).                Discharge Care Instructions  (From admission, onward)           Start     Ordered   12/01/23 0000  Weight bearing as tolerated        12/01/23 0902   12/01/23 0000  Change dressing       Comments: You may remove the bulky bandage (ACE wrap and gauze) two days after surgery. You will have an adhesive waterproof bandage underneath. Leave this in place until your first follow-up appointment.   12/01/23 0902            Diagnostic Studies: US  Venous Img Lower Bilateral (DVT) Result Date: 12/06/2023 CLINICAL DATA:  Leg swelling and bruising, postop day 6 for right knee replacement EXAM: BILATERAL LOWER EXTREMITY VENOUS DOPPLER ULTRASOUND TECHNIQUE: Gray-scale sonography with compression, as well as color and duplex ultrasound, were performed to evaluate the deep venous system(s) from the level of the common femoral vein through the popliteal and proximal calf veins. COMPARISON:  None Available. FINDINGS: VENOUS Normal compressibility of the common femoral, superficial femoral, and popliteal veins, as well as the visualized calf veins. Visualized portions of profunda femoral vein and great saphenous vein unremarkable. No filling defects to suggest DVT on grayscale or color Doppler imaging. Doppler waveforms show normal direction of venous flow, normal respiratory plasticity and response to augmentation. Assessment of the right femoral vein posterior tibial vein was somewhat limited, and the right peroneal vein was not visualized. OTHER None. Limitations: none IMPRESSION: No findings of deep vein thrombosis in either lower extremity. Please note that are views of the right femoral vein and posterior tibial vein were limited and the right peroneal vein was not visualized. This mildly reduces diagnostic sensitivity and specificity. Electronically Signed   By: Ryan Salvage M.D.   On: 12/06/2023 15:22   ECHOCARDIOGRAM COMPLETE Result Date: 11/17/2023    ECHOCARDIOGRAM REPORT   Patient  Name:   Lori [name] Date of Exam: 11/17/2023 Medical Rec #:  [id number]             Height:       67.0 in Accession #:    [id number]            Weight:       158.0 lb Date of Birth:  August 20, 1945             BSA:          1.829 m Patient Age:    78 years              BP:           133/83 mmHg Patient Gender: F                     HR:           82 bpm. Exam Location:  [address] Procedure: 2D Echo, 3D Echo, Cardiac Doppler and Color Doppler (Both Spectral            and Color Flow Doppler were utilized during procedure). Indications:    I48.91 Atrial Fibrillation  History:        Patient has prior history of Echocardiogram examinations, most                 recent 09/04/2021. Signs/Symptoms:Edema, Shortness of Breath and                 Chest Pain; Risk Factors:Hypertension.  Sonographer:    Waldo Guadalajara RCS Referring Phys: [phone] BRANDI L OLLIS IMPRESSIONS  1. Left ventricular ejection fraction, by estimation, is 65 to 70%. Left ventricular ejection fraction by 3D volume is 69 %. The left ventricle has normal function. The left ventricle has no regional wall motion abnormalities. There is mild concentric left ventricular hypertrophy. Left ventricular diastolic parameters are consistent with Grade I diastolic dysfunction (impaired relaxation).  2. Right ventricular systolic function is normal. The right ventricular size is normal. There is normal pulmonary artery systolic pressure. The estimated right ventricular systolic pressure is 29.0 mmHg.  3. Left atrial size was mildly dilated.  4. Right atrial size was moderately dilated.  5. The mitral valve is normal in  structure. Trivial mitral valve regurgitation. No evidence of mitral stenosis.  6. Tricuspid valve regurgitation is moderate.  7. The aortic valve is tricuspid. There is mild calcification of the aortic valve. Aortic valve regurgitation is trivial. Aortic valve sclerosis/calcification is present, without any evidence of aortic stenosis.  8. The  inferior vena cava is normal in size with greater than 50% respiratory variability, suggesting right atrial pressure of 3 mmHg. FINDINGS  Left Ventricle: Left ventricular ejection fraction, by estimation, is 65 to 70%. Left ventricular ejection fraction by 3D volume is 69 %. The left ventricle has normal function. The left ventricle has no regional wall motion abnormalities. The left ventricular internal cavity size was normal in size. There is mild concentric left ventricular hypertrophy. Left ventricular diastolic parameters are consistent with Grade I diastolic dysfunction (impaired relaxation). Right Ventricle: The right ventricular size is normal. No increase in right ventricular wall thickness. Right ventricular systolic function is normal. There is normal pulmonary artery systolic pressure. The tricuspid regurgitant velocity is 2.55 m/s, and  with an assumed right atrial pressure of 3 mmHg, the estimated right ventricular systolic pressure is 29.0 mmHg. Left Atrium: Left atrial size was mildly dilated. Right Atrium: Right atrial size was moderately dilated. Pericardium: There is no evidence of pericardial effusion. Mitral Valve: The mitral valve is normal in structure. Trivial mitral valve regurgitation. No evidence of mitral valve stenosis. Tricuspid Valve: The tricuspid valve is normal in structure. Tricuspid valve regurgitation is moderate . No evidence of tricuspid stenosis. Aortic Valve: The aortic valve is tricuspid. There is mild calcification of the aortic valve. Aortic valve regurgitation is trivial. Aortic valve sclerosis/calcification is present, without any evidence of aortic stenosis. Pulmonic Valve: The pulmonic valve was normal in structure. Pulmonic valve regurgitation is trivial. No evidence of pulmonic stenosis. Aorta: The aortic root is normal in size and structure. Venous: The inferior vena cava is normal in size with greater than 50% respiratory variability, suggesting right atrial  pressure of 3 mmHg. IAS/Shunts: No atrial level shunt detected by color flow Doppler. Additional Comments: 3D was performed not requiring image post processing on an independent workstation and was normal.  LEFT VENTRICLE PLAX 2D LVIDd:         3.50 cm         Diastology LVIDs:         2.60 cm         LV e' medial:    8.38 cm/s LV PW:         0.80 cm         LV E/e' medial:  9.6 LV IVS:        1.00 cm         LV e' lateral:   9.79 cm/s LVOT diam:     2.00 cm         LV E/e' lateral: 8.2 LV SV:         61 LV SV Index:   33 LVOT Area:     3.14 cm        3D Volume EF                                LV 3D EF:    Left  ventricul                                             ar                                             ejection                                             fraction                                             by 3D                                             volume is                                             69 %.                                 3D Volume EF:                                3D EF:        69 %                                LV EDV:       80 ml                                LV ESV:       25 ml                                LV SV:        55 ml RIGHT VENTRICLE RV Basal diam:  3.00 cm RV S prime:     13.50 cm/s TAPSE (M-mode): 1.7 cm RVSP:           29.0 mmHg LEFT ATRIUM             Index        RIGHT ATRIUM           Index LA diam:        3.10 cm 1.69 cm/m   RA Pressure: 3.00 mmHg LA Vol (A2C):   42.8 ml 23.39 ml/m  RA Area:     12.70 cm LA Vol (A4C):   24.6 ml 13.45 ml/m  RA Volume:   31.70 ml  17.33 ml/m  LA Biplane Vol: 34.2 ml 18.69 ml/m  AORTIC VALVE LVOT Vmax:   95.80 cm/s LVOT Vmean:  59.800 cm/s LVOT VTI:    0.193 m  AORTA Ao Root diam: 3.20 cm Ao Asc diam:  3.30 cm MITRAL VALVE               TRICUSPID VALVE MV Area (PHT):             TR Peak grad:   26.0 mmHg MV Decel Time:             TR Vmax:        255.00 cm/s MV E velocity: 80.20  cm/s  Estimated RAP:  3.00 mmHg MV A velocity: 80.20 cm/s  RVSP:           29.0 mmHg MV E/A ratio:  1.00                            SHUNTS                            Systemic VTI:  0.19 m                            Systemic Diam: 2.00 cm Toribio Fuel MD Electronically signed by Toribio Fuel MD Signature Date/Time: 11/17/2023/10:25:16 PM    Final     Disposition: Discharge disposition: 01-Home or Self Care       Discharge Instructions     Call MD / Call 911   Complete by: As directed    If you experience chest pain or shortness of breath, CALL 911 and be transported to the hospital emergency room.  If you develope a fever above 101 F, pus (white drainage) or increased drainage or redness at the wound, or calf pain, call your surgeon's office.   Change dressing   Complete by: As directed    You may remove the bulky bandage (ACE wrap and gauze) two days after surgery. You will have an adhesive waterproof bandage underneath. Leave this in place until your first follow-up appointment.   Constipation Prevention   Complete by: As directed    Drink plenty of fluids.  Prune juice may be helpful.  You may use a stool softener, such as Colace (over the counter) 100 mg twice a day.  Use MiraLax  (over the counter) for constipation as needed.   Diet - low sodium heart healthy   Complete by: As directed    Do not put a pillow under the knee. Place it under the heel.   Complete by: As directed    Driving restrictions   Complete by: As directed    No driving for two weeks   Post-operative opioid taper instructions:   Complete by: As directed    POST-OPERATIVE OPIOID TAPER INSTRUCTIONS: It is important to wean off of your opioid medication as soon as possible. If you do not need pain medication after your surgery it is ok to stop day one. Opioids include: Codeine, Hydrocodone (Norco, Vicodin), Oxycodone (Percocet, oxycontin ) and hydromorphone  amongst others.  Long term and even short term use of  opiods can cause: Increased pain response Dependence Constipation Depression Respiratory depression And more.  Withdrawal symptoms can include Flu like symptoms Nausea, vomiting And more Techniques to manage these symptoms Hydrate well Eat regular healthy meals Stay active Use relaxation techniques(deep breathing, meditating, yoga) Do  Not substitute Alcohol  to help with tapering If you have been on opioids for less than two weeks and do not have pain than it is ok to stop all together.  Plan to wean off of opioids This plan should start within one week post op of your joint replacement. Maintain the same interval or time between taking each dose and first decrease the dose.  Cut the total daily intake of opioids by one tablet each day Next start to increase the time between doses. The last dose that should be eliminated is the evening dose.      TED hose   Complete by: As directed    Use stockings (TED hose) for three weeks on both leg(s).  You may remove them at night for sleeping.   Weight bearing as tolerated   Complete by: As directed         Follow-up Information     Kristian Stabs, [place]. Go on 12/17/2023.   Specialty: Orthopedic Surgery Why: You are scheduled for a post op appointment on Thursday 12/17/23 at 10:30am Contact information: [address], [address] [place] [address] [phone]         [facility].. Go on 12/03/2023.   Why: You are scheduled for physical therapy Thursday 12/03/23 at 1:00pm; [address] Contact information: [address] [place] [address] [phone]                  Signed: Roxie Mess 12/08/2023, 12:33 PM

## 2023-12-09 DIAGNOSIS — M25561 Pain in right knee: Secondary | ICD-10-CM | POA: Diagnosis not present

## 2023-12-11 DIAGNOSIS — M25561 Pain in right knee: Secondary | ICD-10-CM | POA: Diagnosis not present

## 2023-12-14 DIAGNOSIS — M25561 Pain in right knee: Secondary | ICD-10-CM | POA: Diagnosis not present

## 2023-12-16 DIAGNOSIS — M25561 Pain in right knee: Secondary | ICD-10-CM | POA: Diagnosis not present

## 2023-12-18 DIAGNOSIS — M25561 Pain in right knee: Secondary | ICD-10-CM | POA: Diagnosis not present

## 2023-12-21 DIAGNOSIS — M25561 Pain in right knee: Secondary | ICD-10-CM | POA: Diagnosis not present

## 2023-12-23 DIAGNOSIS — M25561 Pain in right knee: Secondary | ICD-10-CM | POA: Diagnosis not present

## 2023-12-25 DIAGNOSIS — M25561 Pain in right knee: Secondary | ICD-10-CM | POA: Diagnosis not present

## 2023-12-28 DIAGNOSIS — M25561 Pain in right knee: Secondary | ICD-10-CM | POA: Diagnosis not present

## 2023-12-30 DIAGNOSIS — M25561 Pain in right knee: Secondary | ICD-10-CM | POA: Diagnosis not present

## 2023-12-30 NOTE — Progress Notes (Unsigned)
  Electrophysiology Office Follow up Visit Note:    Date:  12/31/2023   ID:  Lori [name], DOB Oct 17, 1945, MRN [id number]  PCP:  Loreli Elsie [name], MD  [facility] Cardiologist:  Debby Sor, MD (Inactive)  [facility] Electrophysiologist:  OLE ONEIDA HOLTS, MD    Interval History:     Lori [name] is a 78 y.o. female who presents for a follow up visit.   I last saw the patient March 09, 2023 for her atrial fibrillation.  She also has atrial flutter.  We discussed treatment options and she wanted to proceed with catheter ablation.  She also has a history of GI bleeding and she was planning on left atrial appendage occlusion.  Ultimately her ablation was canceled because of a knee procedure.  She presents today to revisit her atrial fibrillation management strategy.    Today her biggest concern is getting all the toxins out of her system following her surgery.  She has a lot of concerns about the pharmaceuticals she is taking.  She tells me that she is taking aspirin 325 mg by mouth daily in addition to Aleve or Advil to help her platelets from sticking together.  She is not interested in taking long-term anticoagulant and is confident that this is can help protect her.  She uses supplements such as nattokinase to help.       Past medical, surgical, social and family history were reviewed.  ROS:   Please see the history of present illness.    All other systems reviewed and are negative.  EKGs/Labs/Other Studies Reviewed:    The following studies were reviewed today:  May 9 18th 2025 EKG shows sinus rhythm.  First-degree AV delay.        Physical Exam:    VS:  BP 132/80   Pulse 84   Ht 5' 6.5 (1.689 m)   Wt 151 lb (68.5 kg)   SpO2 97%   BMI 24.01 kg/m     Wt Readings from Last 3 Encounters:  12/31/23 151 lb (68.5 kg)  11/30/23 147 lb 11.3 oz (67 kg)  11/18/23 148 lb (67.1 kg)     GEN: no distress CARD: RRR, No MRG RESP: No IWOB.  CTAB.      ASSESSMENT:    1. Paroxysmal atrial fibrillation (HCC)   2. History of GI bleed    PLAN:    In order of problems listed above:  #Paroxysmal atrial fibrillation flutter Symptomatic. Is not currently on anticoagulation given history of GI bleeding.  She is currently taking aspirin 325+ Aleve or Advil every day for pain.  She is sure that she is okay and that this helps her platelets from sticking together.  She is not interested in taking a pharmaceutical. She understands that around the time of a catheter ablation she would have to be on an oral anticoagulant uninterrupted for 4 months.   #Right lower extremity pain Prominent vasculature.  She is concerned she may have varicose veins.  She would like referral to vascular surgery.  Follow-up 6 months with APP.   Signed, OLE HOLTS, MD, [facility], [facility] 12/31/2023 12:12 PM    Electrophysiology [facility]

## 2023-12-31 ENCOUNTER — Ambulatory Visit: Attending: Cardiology | Admitting: Cardiology

## 2023-12-31 VITALS — BP 132/80 | HR 84 | Ht 66.5 in | Wt 151.0 lb

## 2023-12-31 DIAGNOSIS — I480 Paroxysmal atrial fibrillation: Secondary | ICD-10-CM | POA: Diagnosis not present

## 2023-12-31 DIAGNOSIS — M79606 Pain in leg, unspecified: Secondary | ICD-10-CM

## 2023-12-31 DIAGNOSIS — Z8719 Personal history of other diseases of the digestive system: Secondary | ICD-10-CM

## 2023-12-31 NOTE — Patient Instructions (Signed)
 Medication Instructions:  Your physician recommends that you continue on your current medications as directed. Please refer to the Current Medication list given to you today.  *If you need a refill on your cardiac medications before your next appointment, please call your pharmacy*  Follow-Up: At [facility], you and your health needs are our priority.  As part of our continuing mission to provide you with exceptional heart care, our providers are all part of one team.  This team includes your primary Cardiologist (physician) and Advanced Practice Providers or APPs (Physician Assistants and Nurse Practitioners) who all work together to provide you with the care you need, when you need it.  Your next appointment:   6 months  Provider:   You will see one of the following Advanced Practice Providers on your designated Care Team:   Charlies Arthur, [place] Ozell Jodie Passey, PA-C Suzann Riddle, NP Daphne Barrack, NP  Referral to vascular specialist

## 2024-01-01 DIAGNOSIS — M25561 Pain in right knee: Secondary | ICD-10-CM | POA: Diagnosis not present

## 2024-01-04 DIAGNOSIS — M25561 Pain in right knee: Secondary | ICD-10-CM | POA: Diagnosis not present

## 2024-01-06 DIAGNOSIS — M25561 Pain in right knee: Secondary | ICD-10-CM | POA: Diagnosis not present

## 2024-01-08 ENCOUNTER — Ambulatory Visit: Admitting: Gastroenterology

## 2024-01-08 DIAGNOSIS — M25561 Pain in right knee: Secondary | ICD-10-CM | POA: Diagnosis not present

## 2024-01-11 DIAGNOSIS — M25561 Pain in right knee: Secondary | ICD-10-CM | POA: Diagnosis not present

## 2024-01-12 ENCOUNTER — Ambulatory Visit: Admitting: Gastroenterology

## 2024-01-12 ENCOUNTER — Encounter: Payer: Self-pay | Admitting: Gastroenterology

## 2024-01-12 VITALS — BP 136/86 | HR 76 | Ht 63.25 in | Wt 150.2 lb

## 2024-01-12 DIAGNOSIS — Z800 Family history of malignant neoplasm of digestive organs: Secondary | ICD-10-CM | POA: Diagnosis not present

## 2024-01-12 DIAGNOSIS — Z860100 Personal history of colon polyps, unspecified: Secondary | ICD-10-CM

## 2024-01-12 DIAGNOSIS — R110 Nausea: Secondary | ICD-10-CM | POA: Diagnosis not present

## 2024-01-12 DIAGNOSIS — R140 Abdominal distension (gaseous): Secondary | ICD-10-CM

## 2024-01-12 DIAGNOSIS — R194 Change in bowel habit: Secondary | ICD-10-CM | POA: Diagnosis not present

## 2024-01-12 DIAGNOSIS — Z5189 Encounter for other specified aftercare: Secondary | ICD-10-CM | POA: Diagnosis not present

## 2024-01-12 NOTE — Patient Instructions (Signed)
 You have been given a testing kit to check for small intestine bacterial overgrowth (SIBO) which is completed by a company named [employer]. Make sure to return your test in the mail using the return mailing label given to you along with the kit. The test order, your demographic and insurance information have all already been sent to the company. [facility] will collect an upfront charge of $109.00 for commercial insurance plans and $229.00 if you are paying cash. The potential remaining total after claim submission and review is $120.00. Make sure to discuss with [facility] PRIOR to having the test to see if they have gotten information from your insurance company as to how much your testing will cost out of pocket, if any. Please contact [employer] at phone number [phone] to get instructions regarding how to perform the test as our office is unable to give specific testing instructions.    Please follow up in 6-12 months  _______________________________________________________  If your blood pressure at your visit was 140/90 or greater, please contact your primary care physician to follow up on this.  _______________________________________________________  If you are age 77 or older, your body mass index should be between 23-30. Your Body mass index is 26.41 kg/m. If this is out of the aforementioned range listed, please consider follow up with your Primary Care Provider.  If you are age 6 or younger, your body mass index should be between 19-25. Your Body mass index is 26.41 kg/m. If this is out of the aformentioned range listed, please consider follow up with your Primary Care Provider.   ________________________________________________________  The [facility] providers would like to encourage you to use MYCHART to communicate with providers for non-urgent requests or questions.  Due to long hold times on the telephone, sending your provider a message by [facility]  may be a faster and more efficient way to get a response.  Please allow 48 business hours for a response.  Please remember that this is for non-urgent requests.  _______________________________________________________  [facility] is using a team-based approach to care.  Your team is made up of your doctor and two to three APPS. Our APPS (Nurse Practitioners and Physician Assistants) work with your physician to ensure care continuity for you. They are fully qualified to address your health concerns and develop a treatment plan. They communicate directly with your gastroenterologist to care for you. Seeing the Advanced Practice Practitioners on your physician's team can help you by facilitating care more promptly, often allowing for earlier appointments, access to diagnostic testing, procedures, and other specialty referrals.

## 2024-01-12 NOTE — Progress Notes (Addendum)
 Chief Complaint: Change in bowel habits   Referring Provider:     Loreli Elsie [name], MD   HPI:     Lori [name] is a 78 y.o. female with a history of atrial fibrillation and atrial flutter (on ASA 325 mg daily), HTN, mixed connective tissue disorder, osteoarthritis, hemochromatosis with periodic phlebotomy, ccy, history of GI bleed referred to the Gastroenterology Clinic for evaluation of change in bowel habits and to establish care.  She essentially has a longstanding history of multiple GI symptoms and presents today mostly to establish care. Long-standing hx of alternating bowel habits for 10+ years. Can have loose, non-bloody stools, but also formed at times. Not related to PO intake. Can have nausea, but no emesis. No abdominal pain. Weight stable. Will use OTC herbal stool softener at times. Will use Magnesium supplement daily.  Feels that she has lifelong autoimmune diseases that contribute in part to her GI symptoms.  Previously followed with Dr. Rollin at [facility].  Colonoscopy on 10/09/2020 and notable for 3 small 3-5 mm polyps in the cecum, ascending, descending colon removed with cold snare, sigmoid diverticulosis, diffuse melanosis coli.  There was a residual ascending colon polyp removed in piecemeal fashion, with recommendation to repeat in 3 years.  Did undergo repeat colonoscopy along with EGD on 01/08/2023.  Only pathology reports available for review.  Biopsies from the colon with melanosis coli along with tubular adenoma resected from the ascending colon (what appeared to be a subcentimeter polyp based on path report).  Esophageal biopsies with unremarkable squamous mucosa.  Previously, followed with Dr. Timm.  She reports having being diagnosed with autoimmune hepatitis in the past.  Limited records available for review but liver biopsy x 2 does not show AIH. - 10/12/2008: Liver biopsy: Mild grade 1 chronic hepatitis with mild portal fibrosis (stage  I) without steatosis.  Increased iron stores.  Overall findings are nonspecific. - 12/04/2014: Liver biopsy: mild portal chronic inflammation and focal iron deposition without any findings to suggest autoimmune hepatitis. - 11/2014: EGD:  Endoscopy report not available for review but pathology report reviewed notable for normal duodenal biopsies, benign gastric fundic gland polyp, and normal esophageal biopsies - 12/2016: Colonoscopy: Diverticulosis, hemorrhoids, diminutive adenomatous polyp  Reviewed most recent labs from July.  WBC 11.4, otherwise normal CBC.  BG 144, CO2 19, otherwise normal BMP.  Labs in 09/2023 with ferritin 28, iron 40, TIBC 301, sat 13%.  Normal liver enzymes and 08/2023.  Follows in the Hematology Clinic for her hemochromatosis with periodic phlebotomy.  Underwent knee replacement in July.  Fhx notable for father with colon cancer, diagnosed >age 57.   Past Medical History:  Diagnosis Date   Autoimmune hepatitis (HCC)    Cancer (HCC) 06/2017   SQUAMOS CELL REMOVED FROM FOREHEAD   Chronic fatigue    Complication of anesthesia    Partial Hysterectomy-Severe pain that radiated to esophogaus, and 12 more episode    GERD (gastroesophageal reflux disease)    Hemochromatosis    History of kidney stones    Hormone disorder    Hypothyroidism    IBS (irritable bowel syndrome)    Lupus    Mixed connective tissue disease (HCC)    Osteoporosis 07/2017   T score -2.7 stable from prior DEXA   Palpitations    Rheumatoid arthritis (HCC)      Past Surgical History:  Procedure Laterality Date   ABDOMINAL HYSTERECTOMY  Partial; pt states has only had ovaries removed   ABDOMINAL SURGERY     ABDOMINOPLASTY   BLADDER SUSPENSION     BREAST SURGERY  1980   LUMPECTOMY X 2 FROM RIGHT BREAST   CARDIOVASCULAR STRESS TEST  08/28/2011   Normal   CAROTID DOPPLER  08/28/2011   Normal, no evidence of significant diameter reduction, dissection, or vascular abnormality   COLONOSCOPY  W/ POLYPECTOMY  2018   IR IMAGING GUIDED PORT INSERTION  04/27/2020   IR RADIOLOGIST EVAL & MGMT  03/20/2021   LAPAROSCOPIC CHOLECYSTECTOMY     phlebotomies     TONSILLECTOMY     TONSILLECTOMY AND ADENOIDECTOMY     TOTAL KNEE ARTHROPLASTY Right 11/30/2023   Procedure: ARTHROPLASTY, KNEE, TOTAL;  Surgeon: Melodi Lerner, MD;  Location: [facility];  Service: Orthopedics;  Laterality: Right;   TRANSTHORACIC ECHOCARDIOGRAM  01/29/2011   EF >55%, mild concentric LVH   TUBAL LIGATION     Family History  Problem Relation Age of Onset   Seizures Mother    Transient ischemic attack Mother    Glaucoma Mother    Cancer Father 95       COLON   Diabetes Father    Heart disease Father    Breast cancer Maternal Aunt    Heart disease Paternal Uncle    Breast cancer Maternal Aunt    Glaucoma Maternal Grandmother    Social History   Tobacco Use   Smoking status: Never   Smokeless tobacco: Never  Vaping Use   Vaping status: Never Used  Substance Use Topics   Alcohol  use: Not Currently    Comment: wine occ   Drug use: No   Current Outpatient Medications  Medication Sig Dispense Refill   amiloride-hydrochlorothiazide (MODURETIC) 5-50 MG tablet Take 0.5-1 tablets by mouth daily. (Patient not taking: Reported on 12/31/2023)     benzonatate  (TESSALON ) 100 MG capsule Take 100 mg by mouth daily as needed for cough.     dextroamphetamine (DEXTROSTAT) 5 MG tablet Take 5 mg by mouth daily as needed (Chronic fatigue).     Esomeprazole Magnesium (NEXIUM PO) Take 1 capsule by mouth at bedtime as needed (Heartburn). (Patient not taking: Reported on 12/31/2023)     HYDROmorphone  (DILAUDID ) 2 MG tablet Take 1-2 tablets (2-4 mg total) by mouth every 6 (six) hours as needed for severe pain (pain score 7-10). (Patient not taking: Reported on 12/31/2023) 42 tablet 0   hydroxychloroquine (PLAQUENIL) 200 MG tablet Take 200 mg by mouth daily.     IVERMECTIN PO Take 12 mg by mouth daily.     methocarbamol  (ROBAXIN ) 500 MG  tablet Take 1 tablet (500 mg total) by mouth every 6 (six) hours as needed for muscle spasms. (Patient not taking: Reported on 12/31/2023) 40 tablet 0   metoprolol  succinate (TOPROL -XL) 25 MG 24 hr tablet Take 25 mg by mouth daily as needed (Afib).     ondansetron  (ZOFRAN ) 4 MG tablet Take 1 tablet (4 mg total) by mouth every 6 (six) hours as needed for nausea. 20 tablet 0   potassium chloride  (KLOR-CON  M) 10 MEQ tablet Take 20 mEq by mouth daily.     pregabalin  (LYRICA ) 25 MG capsule Take 50 mg by mouth 3 (three) times daily. (Patient not taking: Reported on 12/31/2023)     Probiotic Product (PROBIOTIC DAILY PO) Take 1 capsule by mouth daily. [facility]     Pseudoephedrine-Acetaminophen  (SM NON-ASPRIN SINUS PO) Take by mouth. (Patient not taking: Reported on 12/31/2023)  rivaroxaban  (XARELTO ) 10 MG TABS tablet Take 1 tablet (10 mg total) by mouth daily with breakfast for 21 days. Then take one 81 mg aspirin once a day for three weeks. Then discontinue aspirin. (Patient not taking: Reported on 12/31/2023) 21 tablet 0   thyroid  (ARMOUR) 15 MG tablet Take 15 mg by mouth See admin instructions. Only Monday-Friday     thyroid  (ARMOUR) 60 MG tablet Take 60 mg by mouth daily before breakfast.     traMADol  (ULTRAM ) 50 MG tablet Take 1-2 tablets (50-100 mg total) by mouth every 6 (six) hours as needed for moderate pain (pain score 4-6). (Patient not taking: Reported on 12/31/2023) 40 tablet 0   Current Facility-Administered Medications  Medication Dose Route Frequency Provider Last Rate Last Admin   triamcinolone  acetonide (KENALOG ) 10 MG/ML injection 10 mg  10 mg Other Once Regal, Norman S, DPM       Allergies  Allergen Reactions   Amoxicillin Nausea And Vomiting   Codeine Nausea Only   Keflex [Cephalexin] Itching     No hives, it wasn't that bad   Eliquis  [Apixaban ] Other (See Comments)    Blood in stool,joint pain dizziness   Erythromycin Nausea Only and Other (See Comments)   Iodinated Contrast  Media Other (See Comments)   Meperidine     Pt stated, I don't know what this is; 04/27/17   Metoclopramide  Hcl    Metoprolol      Tired fatigue itching and hdizziness   Percocet [Oxycodone -Acetaminophen ]     Made pt sick    Oxycodone  Nausea And Vomiting    Pt stated, I do not know if I am allergic to this; 04/27/17     Review of Systems: All systems reviewed and negative except where noted in HPI.     Physical Exam:    Wt Readings from Last 3 Encounters:  12/31/23 151 lb (68.5 kg)  11/30/23 147 lb 11.3 oz (67 kg)  11/18/23 148 lb (67.1 kg)    There were no vitals taken for this visit. Constitutional:  Pleasant, in no acute distress. Psychiatric: Normal mood and affect. Behavior is normal. Cardiovascular: Normal rate, regular rhythm. No edema Pulmonary/chest: Effort normal and breath sounds normal. No wheezing, rales or rhonchi. Abdominal: Soft, nondistended, nontender. Bowel sounds active throughout. There are no masses palpable. No hepatomegaly. Neurological: Alert and oriented to person place and time. Skin: Skin is warm and dry. No rashes noted.   ASSESSMENT AND PLAN;   1) Alternating bowel habits 2) Bloating 3) Nausea Discussed broad DDx at length today.  Has had an extensive workup by multiple GI providers in the past.  I reviewed the available medical records, to include EGD x 2, colonoscopy x 3, several CT studies and abdominal x-rays. - SIBO breath testing - Will try to obtain prior endoscopy records from Dr. Rollin  4) Family history of colon cancer 5) Personal history of colon polyps Father with colon cancer, diagnosed > age 62 and personal history of adenomatous polyps.  Last colonoscopy was 12/2022 which was notable for at least 1 adenoma.  Consider repeat colonoscopy in 12/2025 pending review of records.  6) GERD Does have episodic indigestion and pressure which may be regurgitation.  Has previously trialed Nexium but did not tolerate.  Does not want to  start PPI.  Has had upper endoscopy as recently as 2024. - Will obtain medical records with review of prior EGD report as above  I spent over 60 minutes of time, including in depth chart  review, independent review of results as outlined above, communicating results with the patient directly, face-to-face time with the patient, coordinating care, ordering studies and medications as appropriate, and documentation.     Sandor LULLA Flatter, DO, FACG  01/12/2024, 1:30 PM   Loreli Elsie [name], MD  Addendum: Received previous records from Dr. Rollin which are notable for the following: - 10/09/2020: Colonoscopy: 3 polyps measuring 3-5 mm in descending, ascending, cecum removed with cold snare (adenomas), sigmoid diverticulosis, diffuse severe melanosis coli throughout.  Residual ascending colon polyp (adenoma) removed in piecemeal fashion and current resection appears to be complete.  It was difficult to resect because it was on top of a fold.  Recommended repeat in 3 years. - 01/08/2023: Colonoscopy: 2 sessile polyps in the ascending colon measuring 3-4 mm removed with cold snare (adenomas), sigmoid diverticulosis, diffuse area of severe melanosis coli throughout the colon (biopsied: Benign melanosis coli).  Repeat colonoscopy not recommended for surveillance. - 8/15//24: EGD: Normal esophagus empirically dilated with 17 mm Savary dilator without resistance, normal stomach and duodenum.  Esophageal biopsies normal.  Based on these results, no plan for repeat upper endoscopy or colonoscopy at this juncture.  Per previous Endoscopist, no repeat colonoscopy needed from a surveillance standpoint.  Sandor Flatter, DO, [facility]

## 2024-01-13 DIAGNOSIS — M25561 Pain in right knee: Secondary | ICD-10-CM | POA: Diagnosis not present

## 2024-01-15 ENCOUNTER — Telehealth: Payer: Self-pay | Admitting: Gastroenterology

## 2024-01-15 ENCOUNTER — Other Ambulatory Visit (INDEPENDENT_AMBULATORY_CARE_PROVIDER_SITE_OTHER)

## 2024-01-15 ENCOUNTER — Ambulatory Visit: Payer: Self-pay | Admitting: Physician Assistant

## 2024-01-15 ENCOUNTER — Other Ambulatory Visit (HOSPITAL_BASED_OUTPATIENT_CLINIC_OR_DEPARTMENT_OTHER)

## 2024-01-15 ENCOUNTER — Encounter: Payer: Self-pay | Admitting: Gastroenterology

## 2024-01-15 DIAGNOSIS — R1032 Left lower quadrant pain: Secondary | ICD-10-CM

## 2024-01-15 DIAGNOSIS — R194 Change in bowel habit: Secondary | ICD-10-CM

## 2024-01-15 LAB — CBC WITH DIFFERENTIAL/PLATELET
Basophils Absolute: 0.1 K/uL (ref 0.0–0.1)
Basophils Relative: 0.5 % (ref 0.0–3.0)
Eosinophils Absolute: 0.1 K/uL (ref 0.0–0.7)
Eosinophils Relative: 1.1 % (ref 0.0–5.0)
HCT: 42.2 % (ref 36.0–46.0)
Hemoglobin: 14.1 g/dL (ref 12.0–15.0)
Lymphocytes Relative: 12 % (ref 12.0–46.0)
Lymphs Abs: 1.7 K/uL (ref 0.7–4.0)
MCHC: 33.5 g/dL (ref 30.0–36.0)
MCV: 93.8 fl (ref 78.0–100.0)
Monocytes Absolute: 1.1 K/uL — ABNORMAL HIGH (ref 0.1–1.0)
Monocytes Relative: 7.7 % (ref 3.0–12.0)
Neutro Abs: 10.9 K/uL — ABNORMAL HIGH (ref 1.4–7.7)
Neutrophils Relative %: 78.7 % — ABNORMAL HIGH (ref 43.0–77.0)
Platelets: 253 K/uL (ref 150.0–400.0)
RBC: 4.49 Mil/uL (ref 3.87–5.11)
RDW: 13.8 % (ref 11.5–15.5)
WBC: 13.8 K/uL — ABNORMAL HIGH (ref 4.0–10.5)

## 2024-01-15 LAB — COMPREHENSIVE METABOLIC PANEL WITH GFR
ALT: 17 U/L (ref 0–35)
AST: 21 U/L (ref 0–37)
Albumin: 4.3 g/dL (ref 3.5–5.2)
Alkaline Phosphatase: 106 U/L (ref 39–117)
BUN: 12 mg/dL (ref 6–23)
CO2: 25 meq/L (ref 19–32)
Calcium: 9.8 mg/dL (ref 8.4–10.5)
Chloride: 97 meq/L (ref 96–112)
Creatinine, Ser: 0.62 mg/dL (ref 0.40–1.20)
GFR: 85.83 mL/min (ref >60.00)
Glucose, Bld: 120 mg/dL — ABNORMAL HIGH (ref 70–99)
Potassium: 4.4 meq/L (ref 3.5–5.1)
Sodium: 133 meq/L — ABNORMAL LOW (ref 135–145)
Total Bilirubin: 0.9 mg/dL (ref 0.2–1.2)
Total Protein: 7.6 g/dL (ref 6.0–8.3)

## 2024-01-15 LAB — SEDIMENTATION RATE: Sed Rate: 54 mm/h — ABNORMAL HIGH (ref 0–30)

## 2024-01-15 MED ORDER — METRONIDAZOLE 500 MG PO TABS: 500.0000 mg | ORAL_TABLET | Freq: Three times a day (TID) | ORAL | 0 refills | Status: DC

## 2024-01-15 MED ORDER — DICYCLOMINE HCL 20 MG PO TABS: 20.0000 mg | ORAL_TABLET | Freq: Three times a day (TID) | ORAL | 0 refills | Status: DC | PRN

## 2024-01-15 MED ORDER — CIPROFLOXACIN HCL 500 MG PO TABS: 500.0000 mg | ORAL_TABLET | Freq: Two times a day (BID) | ORAL | 0 refills | Status: DC

## 2024-01-15 NOTE — Telephone Encounter (Signed)
 Patient calling in regards to MyChart message please advise.   Thank you

## 2024-01-15 NOTE — Telephone Encounter (Signed)
 Patient was last colonoscopy 2024 shows diverticulosis has had tubular adenomatous polyps. Previous diverticulitis sigmoid documented 2021. Patient states feels very similar symptoms.  - get CBC, CMET, and sed rate. -Will schedule for CT AB and pelvis with contrast to evaluate further STAT ideally today or this coming weekend or Monday -IBGARD daily,can do Bentyl  20 mg as needed TID , heating pad and liquid diet. - Prescribed Cipro  and Flagyl , patient has allergy to augmentin Avoid alcohol  with Flagyl , take with food. Advised to go to the ER if there is any severe abdominal pain, unable to hold down food/water , blood in stool or vomit, chest pain, shortness of breath, or any worsening symptoms.    During an Acute Flare-Up (Clear Liquid to Low-Fiber Diet) The goal is to reduce irritation and let your colon rest.  Day 1-3: Clear Liquid Diet Water   Broth (chicken, beef, or vegetable)  Clear juices (apple, white grape - avoid citrus)  Ice pops without pulp or seeds  Gelatin (no fruit or seeds)  Tea or coffee (no cream or dairy)  After Symptoms Improve: Low-Fiber Diet Gradually transition to low-fiber foods for easier digestion.  Sample Foods White rice, pasta, or plain white bread  Cooked or canned vegetables without skins or seeds (e.g., carrots, green beans, potatoes)  Eggs, fish, or poultry  Low-fiber cereals (like cornflakes)  Dairy (if tolerated)  Ripe bananas, melon, or canned fruit without seeds  Long-Term Maintenance: High-Fiber Diet (Once Fully Recovered) This helps prevent future flare-ups by keeping the bowel movements soft and regular.  High-Fiber Foods Fruits: Apples (peeled), pears, berries, prunes  Vegetables: Broccoli, spinach, zucchini, peas  Whole grains: Oatmeal, brown rice, quinoa, whole wheat bread  Legumes: Lentils, chickpeas, black beans (start slowly to avoid gas)  Nuts & seeds: Only if tolerated (research no longer restricts them, but if you  feel they cause a flare do not eat them)

## 2024-01-15 NOTE — Telephone Encounter (Signed)
 Dr. San patient. MyChart message has been sent to [facility]

## 2024-01-16 ENCOUNTER — Other Ambulatory Visit: Payer: Self-pay | Admitting: Physician Assistant

## 2024-01-16 ENCOUNTER — Ambulatory Visit (HOSPITAL_BASED_OUTPATIENT_CLINIC_OR_DEPARTMENT_OTHER)
Admission: RE | Admit: 2024-01-16 | Discharge: 2024-01-16 | Disposition: A | Discharge status: Home / Self Care | Source: Ambulatory Visit | Attending: Physician Assistant | Admitting: Physician Assistant

## 2024-01-16 DIAGNOSIS — K5732 Diverticulitis of large intestine without perforation or abscess without bleeding: Secondary | ICD-10-CM | POA: Diagnosis not present

## 2024-01-16 DIAGNOSIS — R194 Change in bowel habit: Secondary | ICD-10-CM

## 2024-01-16 DIAGNOSIS — R1032 Left lower quadrant pain: Secondary | ICD-10-CM | POA: Insufficient documentation

## 2024-01-16 MED ORDER — BARIUM SULFATE 2 % PO SUSP
450.0000 mL | Freq: Once | ORAL | Status: AC
  Administered 2024-01-16: 450 mL via ORAL

## 2024-01-18 ENCOUNTER — Ambulatory Visit: Payer: Self-pay | Admitting: Physician Assistant

## 2024-01-18 DIAGNOSIS — M25561 Pain in right knee: Secondary | ICD-10-CM | POA: Diagnosis not present

## 2024-01-19 ENCOUNTER — Other Ambulatory Visit: Payer: Self-pay | Admitting: Medical Oncology

## 2024-01-19 ENCOUNTER — Encounter: Payer: Self-pay | Admitting: Medical Oncology

## 2024-01-19 ENCOUNTER — Inpatient Hospital Stay: Attending: Hematology & Oncology

## 2024-01-19 ENCOUNTER — Inpatient Hospital Stay (HOSPITAL_BASED_OUTPATIENT_CLINIC_OR_DEPARTMENT_OTHER): Admitting: Medical Oncology

## 2024-01-19 ENCOUNTER — Encounter: Payer: Self-pay | Admitting: Gastroenterology

## 2024-01-19 ENCOUNTER — Inpatient Hospital Stay

## 2024-01-19 DIAGNOSIS — Z7982 Long term (current) use of aspirin: Secondary | ICD-10-CM | POA: Insufficient documentation

## 2024-01-19 DIAGNOSIS — I480 Paroxysmal atrial fibrillation: Secondary | ICD-10-CM | POA: Diagnosis not present

## 2024-01-19 LAB — CMP (CANCER CENTER ONLY)
ALT: 13 U/L (ref 0–44)
AST: 18 U/L (ref 15–41)
Albumin: 4.1 g/dL (ref 3.5–5.0)
Alkaline Phosphatase: 95 U/L (ref 38–126)
Anion gap: 14 (ref 5–15)
BUN: 12 mg/dL (ref 8–23)
CO2: 21 mmol/L — ABNORMAL LOW (ref 22–32)
Calcium: 9.4 mg/dL (ref 8.9–10.3)
Chloride: 95 mmol/L — ABNORMAL LOW (ref 98–111)
Creatinine: 0.58 mg/dL (ref 0.44–1.00)
GFR, Estimated: >60 mL/min (ref >60)
Glucose, Bld: 121 mg/dL — ABNORMAL HIGH (ref 70–99)
Potassium: 3.4 mmol/L — ABNORMAL LOW (ref 3.5–5.1)
Sodium: 130 mmol/L — ABNORMAL LOW (ref 135–145)
Total Bilirubin: 0.5 mg/dL (ref 0.0–1.2)
Total Protein: 6.8 g/dL (ref 6.5–8.1)

## 2024-01-19 LAB — CBC
HCT: 36.9 % (ref 36.0–46.0)
Hemoglobin: 12.7 g/dL (ref 12.0–15.0)
MCH: 31.5 pg (ref 26.0–34.0)
MCHC: 34.4 g/dL (ref 30.0–36.0)
MCV: 91.6 fL (ref 80.0–100.0)
Platelets: 272 K/uL (ref 150–400)
RBC: 4.03 MIL/uL (ref 3.87–5.11)
RDW: 13 % (ref 11.5–15.5)
WBC: 14.3 K/uL — ABNORMAL HIGH (ref 4.0–10.5)
nRBC: 0 % (ref 0.0–0.2)

## 2024-01-19 LAB — IRON AND IRON BINDING CAPACITY (CC-WL,HP ONLY)
Iron: 12 ug/dL — ABNORMAL LOW (ref 28–170)
Saturation Ratios: 6 % — ABNORMAL LOW (ref 10.4–31.8)
TIBC: 217 ug/dL — ABNORMAL LOW (ref 250–450)
UIBC: 205 ug/dL

## 2024-01-19 LAB — FERRITIN: Ferritin: 195 ng/mL (ref 11–307)

## 2024-01-19 NOTE — Patient Instructions (Signed)

## 2024-01-19 NOTE — Progress Notes (Signed)
 Hematology and Oncology Follow Up Visit  Lori [name] [id number] [birth year] 78 y.o. 01/19/2024   Principle Diagnosis:  Hemochromatosis - heterozygous for the C282Y mutation Paroxysmal A fib   Current Therapy:        Phlebotomy as indicated to keep ferritin < 35  (changed per patient request 06/24/17) or iron saturation less than 30% Xarelto  20 mg PO daily patient stopped and is currently on aspirin and Advil BID   Interim History:  Lori [name] is here today for follow-up.   She reports that she is currently healing from a bout of diverticulitis. She is on cipro /flagyl  and is followed by GI. She is on a clear liquids diet. She is using anti-nausea medications which help. Symptoms have improved slightly since starting the antibiotics.   She had knee surgery in July. She held medication such as her thyroid  medication a few days prior to this procedure. She has not restarted them per patient.   She states that she also has headaches at night when her iron counts are on the high side. She has not been experiencing this recently.   No fever, chills, n/v, cough, chest pain, palpitations, abdominal pain or changes in bowel or bladder habits at this time.   No falls or syncope.  Appetite and hydration are good. Wt Readings from Last 3 Encounters:  01/19/24 148 lb 12.8 oz (67.5 kg)  01/12/24 150 lb 4 oz (68.2 kg)  12/31/23 151 lb (68.5 kg)     ECOG Performance Status: 1 - Symptomatic but completely ambulatory  Medications:  Allergies as of 01/19/2024       Reactions   Amoxicillin Nausea And Vomiting   Codeine Nausea Only   Keflex [cephalexin] Itching    No hives, it wasn't that bad   Eliquis  [apixaban ] Other (See Comments)   Blood in stool,joint pain dizziness   Erythromycin Nausea Only, Other (See Comments)   Iodinated Contrast Media Other (See Comments)   Pt states that she is asthmatic and cannot have. Refuses to receive contrast even with premedication.   Meperidine     Pt stated, I don't know what this is; 04/27/17   Metoclopramide  Hcl    Metoprolol     Tired fatigue itching and hdizziness   Percocet [oxycodone -acetaminophen ]    Made pt sick    Reglan  [metoclopramide ]    Zolpidem    Other Reaction(s): Not available   Oxycodone  Nausea And Vomiting   Pt stated, I do not know if I am allergic to this; 04/27/17        Medication List        Accurate as of January 19, 2024 12:31 PM. If you have any questions, ask your nurse or doctor.          amiloride-hydrochlorothiazide 5-50 MG tablet Commonly known as: MODURETIC Take 0.5-1 tablets by mouth daily.   benzonatate  100 MG capsule Commonly known as: TESSALON  Take 100 mg by mouth daily as needed for cough.   ciprofloxacin  500 MG tablet Commonly known as: Cipro  Take 1 tablet (500 mg total) by mouth 2 (two) times daily.   dextroamphetamine 5 MG tablet Commonly known as: DEXTROSTAT Take 5 mg by mouth daily as needed (Chronic fatigue).   dicyclomine  20 MG tablet Commonly known as: BENTYL  Take 1 tablet (20 mg total) by mouth every 8 (eight) hours as needed for spasms (AB pain).   diphenhydrAMINE  25 MG tablet Commonly known as: BENADRYL  Take 25 mg by mouth as needed.   hydroxychloroquine 200 MG  tablet Commonly known as: PLAQUENIL Take 200 mg by mouth daily.   IVERMECTIN PO Take 12 mg by mouth daily.   metoprolol  succinate 25 MG 24 hr tablet Commonly known as: TOPROL -XL Take 25 mg by mouth daily as needed (Afib).   metroNIDAZOLE  500 MG tablet Commonly known as: FLAGYL  Take 1 tablet (500 mg total) by mouth 3 (three) times daily.   NEXIUM PO Take 1 capsule by mouth at bedtime as needed (Heartburn).   omeprazole 20 MG capsule Commonly known as: PRILOSEC Take 20 mg by mouth as needed.   ondansetron  4 MG tablet Commonly known as: ZOFRAN  Take 1 tablet (4 mg total) by mouth every 6 (six) hours as needed for nausea.   potassium chloride  10 MEQ tablet Commonly known as:  KLOR-CON  M Take 20 mEq by mouth daily.   pregabalin  25 MG capsule Commonly known as: LYRICA  Take 50 mg by mouth 3 (three) times daily.   PROBIOTIC DAILY PO Take 1 capsule by mouth daily. [facility]   rivaroxaban  10 MG Tabs tablet Commonly known as: XARELTO  Take 1 tablet (10 mg total) by mouth daily with breakfast for 21 days. Then take one 81 mg aspirin once a day for three weeks. Then discontinue aspirin.   thyroid  60 MG tablet Commonly known as: ARMOUR Take 60 mg by mouth daily before breakfast.   thyroid  15 MG tablet Commonly known as: ARMOUR Take 15 mg by mouth See admin instructions. Only Monday-Friday        Allergies:  Allergies  Allergen Reactions   Amoxicillin Nausea And Vomiting   Codeine Nausea Only   Keflex [Cephalexin] Itching     No hives, it wasn't that bad   Eliquis  [Apixaban ] Other (See Comments)    Blood in stool,joint pain dizziness   Erythromycin Nausea Only and Other (See Comments)   Iodinated Contrast Media Other (See Comments)    Pt states that she is asthmatic and cannot have. Refuses to receive contrast even with premedication.   Meperidine     Pt stated, I don't know what this is; 04/27/17   Metoclopramide  Hcl    Metoprolol      Tired fatigue itching and hdizziness   Percocet [Oxycodone -Acetaminophen ]     Made pt sick    Reglan  [Metoclopramide ]    Zolpidem     Other Reaction(s): Not available   Oxycodone  Nausea And Vomiting    Pt stated, I do not know if I am allergic to this; 04/27/17    Past Medical History, Surgical history, Social history, and Family History were reviewed and updated.  Review of Systems: All other 10 point review of systems is negative.   Physical Exam:  height is 5' 3 (1.6 m) and weight is 148 lb 12.8 oz (67.5 kg). Her oral temperature is 98.8 F (37.1 C). Her blood pressure is 124/62 and her pulse is 95. Her respiration is 18 and oxygen saturation is 100%.   Wt Readings from Last 3 Encounters:   01/19/24 148 lb 12.8 oz (67.5 kg)  01/12/24 150 lb 4 oz (68.2 kg)  12/31/23 151 lb (68.5 kg)    Ocular: Sclerae unicteric, pupils equal, round and reactive to light Ear-nose-throat: Oropharynx clear, dentition fair Lymphatic: No cervical or supraclavicular adenopathy Lungs no rales or rhonchi, good excursion bilaterally Heart regular rate and rhythm, no murmur appreciated Abd soft, nontender, positive bowel sounds MSK no focal spinal tenderness, no joint edema Neuro: non-focal, well-oriented, appropriate affect Breasts: Deferred   Lab Results  Component Value  Date   WBC 14.3 (H) 01/19/2024   HGB 12.7 01/19/2024   HCT 36.9 01/19/2024   MCV 91.6 01/19/2024   PLT 272 01/19/2024   Lab Results  Component Value Date   FERRITIN 28 09/25/2023   IRON 40 09/25/2023   TIBC 301 09/25/2023   UIBC 261 09/25/2023   IRONPCTSAT 13 09/25/2023   Lab Results  Component Value Date   RETICCTPCT 1.7 05/29/2023   RBC 4.03 01/19/2024   No results found for: KPAFRELGTCHN, LAMBDASER, KAPLAMBRATIO No results found for: IGGSERUM, IGA, IGMSERUM No results found for: STEPHANY CARLOTA BENSON MARKEL EARLA JOANNIE DOC, MSPIKE, SPEI   Chemistry      Component Value Date/Time   NA 130 (L) 01/19/2024 1134   NA 139 10/12/2023 0954   NA 143 03/11/2017 1404   NA 139 07/02/2016 1139   K 3.4 (L) 01/19/2024 1134   K 3.9 03/11/2017 1404   K 4.1 07/02/2016 1139   CL 95 (L) 01/19/2024 1134   CL 105 03/11/2017 1404   CO2 21 (L) 01/19/2024 1134   CO2 31 03/11/2017 1404   CO2 26 07/02/2016 1139   BUN 12 01/19/2024 1134   BUN 18 10/12/2023 0954   BUN 15 03/11/2017 1404   BUN 20.3 07/02/2016 1139   CREATININE 0.58 01/19/2024 1134   CREATININE 0.73 06/23/2022 1141   CREATININE 0.8 07/02/2016 1139      Component Value Date/Time   CALCIUM 9.4 01/19/2024 1134   CALCIUM 9.4 03/11/2017 1404   CALCIUM 10.0 07/02/2016 1139   ALKPHOS 95 01/19/2024 1134   ALKPHOS 69  03/11/2017 1404   ALKPHOS 87 07/02/2016 1139   AST 18 01/19/2024 1134   AST 27 07/02/2016 1139   ALT 13 01/19/2024 1134   ALT 32 03/11/2017 1404   ALT 34 07/02/2016 1139   BILITOT 0.5 01/19/2024 1134   BILITOT 0.55 07/02/2016 1139     Encounter Diagnosis  Name Primary?   Hereditary hemochromatosis (HCC) Yes     Impression and Plan: Ms. Haycraft is a pleasant 78 yo caucasian female with hemochromatosis, heterozygous for the C282Y mutation.  She gets therapeutic phlebotomies PRN to keep ferritin <35 and iron saturation <30%.   She is currently on Cipro /flagyl  for diverticulitis flare. Continue GI follow up.  Discussed the importance of taking her medications as directed. Contact PCP today to discuss restarting these and monitoring.   CBC stable- especially for being off of her rheumatological medications.   Iron studies are pending.   RTC 3 months MD, labs, phlebotomy   Lauraine CHRISTELLA Dais, PA-C 8/26/202512:31 PM

## 2024-01-20 DIAGNOSIS — M25561 Pain in right knee: Secondary | ICD-10-CM | POA: Diagnosis not present

## 2024-01-21 ENCOUNTER — Ambulatory Visit: Payer: Self-pay | Admitting: Medical Oncology

## 2024-01-22 DIAGNOSIS — M25561 Pain in right knee: Secondary | ICD-10-CM | POA: Diagnosis not present

## 2024-01-26 DIAGNOSIS — M25561 Pain in right knee: Secondary | ICD-10-CM | POA: Diagnosis not present

## 2024-01-29 DIAGNOSIS — M25561 Pain in right knee: Secondary | ICD-10-CM | POA: Diagnosis not present

## 2024-02-01 DIAGNOSIS — M25561 Pain in right knee: Secondary | ICD-10-CM | POA: Diagnosis not present

## 2024-02-03 DIAGNOSIS — M25561 Pain in right knee: Secondary | ICD-10-CM | POA: Diagnosis not present

## 2024-02-05 DIAGNOSIS — M25561 Pain in right knee: Secondary | ICD-10-CM | POA: Diagnosis not present

## 2024-02-11 ENCOUNTER — Encounter: Payer: Self-pay | Admitting: Gastroenterology

## 2024-02-12 NOTE — Progress Notes (Signed)
 02/15/2024 Lori [name] [id number] 03/05/1946  Referring provider: Loreli Elsie [name], MD Primary GI doctor: Dr. San  ASSESSMENT AND PLAN:  Diverticulitis Previous diverticulitis sigmoid documented 2021 Feels it may have started after her knee surgery on 11/30/2023, possibly from constipation from pain medications 01/18/24 [facility] uncomplicated diverticulitis started on cipro /flagyl  but had GI upset -Uncertain if this is post IBS, mild rebound tenderness on exam, will recheck CBC, CMET, sed rate -Consider repeat CT and repeat ABX if CBC/CMET/sed -rate elevated -She states she just had nausea with amoxicillin and would be willing to retry if needed, she also prefers bactrim over flagyl .   Constipation with AB pain after knee surgery in July Likely decreased movement, medications induced -Declines SIBO testing due to cost - increase movement, increase water , add on kiwi - declines medications  Personal history of colon polyps  2024 colonoscopy shows diverticulosis has had tubular adenomatous polyps.  Atrial fibrillation and atrial flutter  on ASA 325 mg daily  mixed connective tissue disorder On Plaquenil  hemochromatosis  01/19/2024 Iron 12 Ferritin 195 B12 2,493 with periodic phlebotomy  Prefers alternative medications  Patient Care Team: Loreli Elsie [name], MD as PCP - General (Internal Medicine) Burnard Debby LABOR, MD (Inactive) as PCP - Cardiology (Cardiology) Cindie Ole DASEN, MD as PCP - Electrophysiology (Cardiology) Loreli Elsie [name], MD as Consulting Physician (Internal Medicine) Roetta Blunt, MD as Referring Physician (Family Medicine) Rolan Ezra RAMAN, MD as Consulting Physician (Cardiology)  HISTORY OF PRESENT ILLNESS: 78 y.o. female with a past medical history of atrial fibrillation and atrial flutter (on ASA 325 mg daily), HTN, mixed connective tissue disorder, osteoarthritis, hemochromatosis with periodic phlebotomy, ccy,  history of GI bleed and others listed below presents for evaluation of diverticulitis.   Last seen by Dr. San 01/12/2024 for change in bowel habits.   Discussed the use of AI scribe software for clinical note transcription with the patient, who gave verbal consent to proceed.  History of Present Illness   Lori [name] is a 78 year old female who presents with ongoing abdominal pain and gastrointestinal issues following knee surgery.  She has persistent abdominal pain and was treated with Flagyl  and Cipro  due to an allergy to Augmentin. She has a history of intolerance to various antibiotics, including nausea and itching, but can tolerate penicillin per patient. She prefers not to use Cipro  and is open to alternatives like Bactrim or augmentin if needed to going forward for diverticulitis.  Following her knee surgery, she experienced significant changes in bowel habits, including constipation requiring laxatives, resulting in diarrhea. Her bowel movements have changed, with new onset of IBS-like pain and frequent urges to defecate.Denies fever, chills, hematochezia, melena. She experiences constant indigestion and gas, treated with baking soda, and attributes some digestive issues to reduced physical activity post-surgery. She is on a probiotic that she believes maintains her gut health.  She occasionally takes metoprolol  for severe palpitations, which causes itching and rash, but avoids Xarelto  due to past adverse reactions. She uses alternative supplements like vitamin K2 and aspirin for cardiovascular health.      She  reports that she has never smoked. She has never used smokeless tobacco. She reports that she does not currently use alcohol . She reports that she does not use drugs.  RELEVANT GI HISTORY, IMAGING AND LABS: Results   LABS WBC: 13.8 (12/2023) Sedimentation rate: 54 (12/2023)  RADIOLOGY CT scan: Active diverticulitis (12/2023)      CBC  Component Value Date/Time   WBC 14.3 (H) 01/19/2024 1134   RBC 4.03 01/19/2024 1134   HGB 12.7 01/19/2024 1134   HGB 13.3 10/12/2023 0954   HGB 14.4 03/11/2017 1404   HGB 14.9 08/27/2015 0926   HCT 36.9 01/19/2024 1134   HCT 40.2 10/12/2023 0954   HCT 42.3 03/11/2017 1404   HCT 42.7 08/27/2015 0926   PLT 272 01/19/2024 1134   PLT 248 10/12/2023 0954   MCV 91.6 01/19/2024 1134   MCV 95 10/12/2023 0954   MCV 96 03/11/2017 1404   MCV 92.0 08/27/2015 0926   MCH 31.5 01/19/2024 1134   MCHC 34.4 01/19/2024 1134   RDW 13.0 01/19/2024 1134   RDW 12.3 10/12/2023 0954   RDW 13.0 03/11/2017 1404   RDW 12.6 08/27/2015 0926   LYMPHSABS 1.7 01/15/2024 1336   LYMPHSABS 2.3 03/11/2017 1404   LYMPHSABS 2.4 08/27/2015 0926   MONOABS 1.1 (H) 01/15/2024 1336   MONOABS 0.5 08/27/2015 0926   EOSABS 0.1 01/15/2024 1336   EOSABS 0.1 03/11/2017 1404   BASOSABS 0.1 01/15/2024 1336   BASOSABS 0.0 03/11/2017 1404   BASOSABS 0.0 08/27/2015 0926   Recent Labs    05/29/23 1215 06/23/23 1346 08/05/23 1340 09/02/23 1025 09/25/23 1035 10/12/23 0954 11/18/23 1351 12/01/23 0341 01/15/24 1336 01/19/24 1134  HGB 14.2 14.2 13.8 13.3 13.1 13.3 14.4 13.1 14.1 12.7    CMP     Component Value Date/Time   NA 130 (L) 01/19/2024 1134   NA 139 10/12/2023 0954   NA 143 03/11/2017 1404   NA 139 07/02/2016 1139   K 3.4 (L) 01/19/2024 1134   K 3.9 03/11/2017 1404   K 4.1 07/02/2016 1139   CL 95 (L) 01/19/2024 1134   CL 105 03/11/2017 1404   CO2 21 (L) 01/19/2024 1134   CO2 31 03/11/2017 1404   CO2 26 07/02/2016 1139   GLUCOSE 121 (H) 01/19/2024 1134   GLUCOSE 159 (H) 03/11/2017 1404   BUN 12 01/19/2024 1134   BUN 18 10/12/2023 0954   BUN 15 03/11/2017 1404   BUN 20.3 07/02/2016 1139   CREATININE 0.58 01/19/2024 1134   CREATININE 0.73 06/23/2022 1141   CREATININE 0.8 07/02/2016 1139   CALCIUM 9.4 01/19/2024 1134   CALCIUM 9.4 03/11/2017 1404   CALCIUM 10.0 07/02/2016 1139   PROT 6.8  01/19/2024 1134   PROT 6.5 09/04/2021 0844   PROT 6.6 03/11/2017 1404   PROT 7.5 07/02/2016 1139   ALBUMIN 4.1 01/19/2024 1134   ALBUMIN 4.4 09/04/2021 0844   ALBUMIN 4.1 07/02/2016 1139   AST 18 01/19/2024 1134   AST 27 07/02/2016 1139   ALT 13 01/19/2024 1134   ALT 32 03/11/2017 1404   ALT 34 07/02/2016 1139   ALKPHOS 95 01/19/2024 1134   ALKPHOS 69 03/11/2017 1404   ALKPHOS 87 07/02/2016 1139   BILITOT 0.5 01/19/2024 1134   BILITOT 0.55 07/02/2016 1139   GFRNONAA >60 01/19/2024 1134   GFRAA >60 12/07/2019 1307      Latest Ref Rng & Units 01/19/2024   11:34 AM 01/15/2024    1:36 PM 11/18/2023    1:51 PM  Hepatic Function  Total Protein 6.5 - 8.1 g/dL 6.8  7.6  6.5   Albumin 3.5 - 5.0 g/dL 4.1  4.3  4.1   AST 15 - 41 U/L 18  21  30    ALT 0 - 44 U/L 13  17  23    Alk Phosphatase 38 - 126 U/L 95  106  71   Total Bilirubin 0.0 - 1.2 mg/dL 0.5  0.9  1.1       Current Medications:   Current Outpatient Medications (Endocrine & Metabolic):    thyroid  (ARMOUR) 15 MG tablet, Take 15 mg by mouth See admin instructions. Only Monday-Friday   thyroid  (ARMOUR) 60 MG tablet, Take 60 mg by mouth daily before breakfast.  Current Facility-Administered Medications (Endocrine & Metabolic):    triamcinolone  acetonide (KENALOG ) 10 MG/ML injection 10 mg  Current Outpatient Medications (Cardiovascular):    amiloride-hydrochlorothiazide (MODURETIC) 5-50 MG tablet, Take 0.5-1 tablets by mouth daily.   metoprolol  succinate (TOPROL -XL) 25 MG 24 hr tablet, Take 25 mg by mouth daily as needed (Afib).   Current Outpatient Medications (Respiratory):    benzonatate  (TESSALON ) 100 MG capsule, Take 100 mg by mouth daily as needed for cough.   diphenhydrAMINE  (BENADRYL ) 25 MG tablet, Take 25 mg by mouth as needed.     Current Outpatient Medications (Hematological):    rivaroxaban  (XARELTO ) 10 MG TABS tablet, Take 1 tablet (10 mg total) by mouth daily with breakfast for 21 days. Then take one 81 mg  aspirin once a day for three weeks. Then discontinue aspirin. (Patient not taking: Reported on 02/15/2024)   Current Outpatient Medications (Other):    dextroamphetamine (DEXTROSTAT) 5 MG tablet, Take 5 mg by mouth daily as needed (Chronic fatigue).   dicyclomine  (BENTYL ) 20 MG tablet, Take 1 tablet (20 mg total) by mouth every 8 (eight) hours as needed for spasms (AB pain).   Esomeprazole Magnesium (NEXIUM PO), Take 1 capsule by mouth at bedtime as needed (Heartburn).   hydroxychloroquine (PLAQUENIL) 200 MG tablet, Take 200 mg by mouth daily.   IVERMECTIN PO, Take 12 mg by mouth daily.   ondansetron  (ZOFRAN ) 4 MG tablet, Take 1 tablet (4 mg total) by mouth every 6 (six) hours as needed for nausea.   potassium chloride  (KLOR-CON  M) 10 MEQ tablet, Take 20 mEq by mouth daily.   Probiotic Product (PROBIOTIC DAILY PO), Take 1 capsule by mouth daily. [facility]   pregabalin  (LYRICA ) 25 MG capsule, Take 50 mg by mouth 3 (three) times daily. (Patient not taking: Reported on 02/15/2024)   Medical History:  Past Medical History:  Diagnosis Date   Autoimmune hepatitis (HCC)    Cancer (HCC) 06/2017   SQUAMOS CELL REMOVED FROM FOREHEAD   Chronic fatigue    Complication of anesthesia    Partial Hysterectomy-Severe pain that radiated to esophogaus, and 12 more episode    GERD (gastroesophageal reflux disease)    Hemochromatosis    History of kidney stones    Hormone disorder    Hypothyroidism    IBS (irritable bowel syndrome)    Lupus    Mixed connective tissue disease (HCC)    Osteoporosis 07/2017   T score -2.7 stable from prior DEXA   Palpitations    Rheumatoid arthritis (HCC)    Allergies:  Allergies  Allergen Reactions   Amoxicillin Nausea And Vomiting   Codeine Nausea Only   Keflex [Cephalexin] Itching     No hives, it wasn't that bad   Eliquis  [Apixaban ] Other (See Comments)    Blood in stool,joint pain dizziness   Erythromycin Nausea Only and Other (See Comments)    Iodinated Contrast Media Other (See Comments)    Pt states that she is asthmatic and cannot have. Refuses to receive contrast even with premedication.   Meperidine     Pt stated, I don't know what this is; 04/27/17  Metoclopramide  Hcl    Metoprolol      Tired fatigue itching and hdizziness   Percocet [Oxycodone -Acetaminophen ]     Made pt sick    Reglan  [Metoclopramide ]    Zolpidem     Other Reaction(s): Not available   Oxycodone  Nausea And Vomiting    Pt stated, I do not know if I am allergic to this; 04/27/17     Surgical History:  She  has a past surgical history that includes Breast surgery (1980); Tubal ligation; Abdominal surgery; Laparoscopic cholecystectomy; Tonsillectomy and adenoidectomy; Bladder suspension; CAROTID DOPPLER (08/28/2011); Cardiovascular stress test (08/28/2011); transthoracic echocardiogram (01/29/2011); Colonoscopy w/ polypectomy (2018); Tonsillectomy; IR IMAGING GUIDED PORT INSERTION (04/27/2020); phlebotomies; Abdominal hysterectomy; IR Radiologist Eval & Mgmt (03/20/2021); and Total knee arthroplasty (Right, 11/30/2023). Family History:  Her family history includes Breast cancer in her maternal aunt and maternal aunt; Colon cancer (age of onset: 25) in her father; Diabetes in her father; Glaucoma in her maternal grandmother and mother; Heart disease in her father and paternal uncle; Seizures in her mother; Transient ischemic attack in her mother.  REVIEW OF SYSTEMS  : All other systems reviewed and negative except where noted in the History of Present Illness.  PHYSICAL EXAM: BP 110/70 (BP Location: Left Arm, Patient Position: Sitting, Cuff Size: Normal)   Pulse 88   Ht 5' 6 (1.676 m)   Wt 148 lb 2 oz (67.2 kg)   BMI 23.91 kg/m  Physical Exam   GENERAL APPEARANCE: Well nourished, in no apparent distress. HEENT: No cervical lymphadenopathy, unremarkable thyroid , sclerae anicteric, conjunctiva pink. RESPIRATORY: Respiratory effort normal, breath sounds equal  bilaterally without rales, rhonchi, or wheezing. CARDIO: Regular rate and rhythm with no murmurs, rubs, or gallops, peripheral pulses intact. ABDOMEN: Soft, non-distended, active bowel sounds in all four quadrants, mild rebound tenderness, no mass appreciated. RECTAL: not evaluated MUSCULOSKELETAL: Full range of motion, normal gait, without edema. SKIN: Dry, intact without rashes or lesions. No jaundice. NEURO: Alert, oriented, no focal deficits. PSYCH: Cooperative, normal mood and affect.      Alan JONELLE Coombs, PA-C 12:14 PM

## 2024-02-15 ENCOUNTER — Ambulatory Visit (INDEPENDENT_AMBULATORY_CARE_PROVIDER_SITE_OTHER): Admitting: Physician Assistant

## 2024-02-15 ENCOUNTER — Encounter: Payer: Self-pay | Admitting: Physician Assistant

## 2024-02-15 ENCOUNTER — Other Ambulatory Visit (INDEPENDENT_AMBULATORY_CARE_PROVIDER_SITE_OTHER)

## 2024-02-15 ENCOUNTER — Ambulatory Visit: Payer: Self-pay | Admitting: Physician Assistant

## 2024-02-15 VITALS — BP 110/70 | HR 88 | Ht 66.0 in | Wt 148.1 lb

## 2024-02-15 DIAGNOSIS — K5900 Constipation, unspecified: Secondary | ICD-10-CM | POA: Diagnosis not present

## 2024-02-15 DIAGNOSIS — M359 Systemic involvement of connective tissue, unspecified: Secondary | ICD-10-CM

## 2024-02-15 DIAGNOSIS — Z9889 Other specified postprocedural states: Secondary | ICD-10-CM | POA: Diagnosis not present

## 2024-02-15 DIAGNOSIS — Z800 Family history of malignant neoplasm of digestive organs: Secondary | ICD-10-CM

## 2024-02-15 DIAGNOSIS — R140 Abdominal distension (gaseous): Secondary | ICD-10-CM

## 2024-02-15 DIAGNOSIS — R194 Change in bowel habit: Secondary | ICD-10-CM

## 2024-02-15 DIAGNOSIS — R110 Nausea: Secondary | ICD-10-CM

## 2024-02-15 DIAGNOSIS — K5732 Diverticulitis of large intestine without perforation or abscess without bleeding: Secondary | ICD-10-CM | POA: Diagnosis not present

## 2024-02-15 DIAGNOSIS — R1032 Left lower quadrant pain: Secondary | ICD-10-CM

## 2024-02-15 LAB — COMPREHENSIVE METABOLIC PANEL WITH GFR
ALT: 14 U/L (ref 0–35)
AST: 20 U/L (ref 0–37)
Albumin: 4.4 g/dL (ref 3.5–5.2)
Alkaline Phosphatase: 93 U/L (ref 39–117)
BUN: 27 mg/dL — ABNORMAL HIGH (ref 6–23)
CO2: 29 meq/L (ref 19–32)
Calcium: 10.2 mg/dL (ref 8.4–10.5)
Chloride: 100 meq/L (ref 96–112)
Creatinine, Ser: 0.74 mg/dL (ref 0.40–1.20)
GFR: 77.93 mL/min (ref >60.00)
Glucose, Bld: 92 mg/dL (ref 70–99)
Potassium: 4.5 meq/L (ref 3.5–5.1)
Sodium: 137 meq/L (ref 135–145)
Total Bilirubin: 0.4 mg/dL (ref 0.2–1.2)
Total Protein: 7.4 g/dL (ref 6.0–8.3)

## 2024-02-15 LAB — CBC WITH DIFFERENTIAL/PLATELET
Basophils Absolute: 0.1 K/uL (ref 0.0–0.1)
Basophils Relative: 1 % (ref 0.0–3.0)
Eosinophils Absolute: 0.2 K/uL (ref 0.0–0.7)
Eosinophils Relative: 3.5 % (ref 0.0–5.0)
HCT: 42.2 % (ref 36.0–46.0)
Hemoglobin: 13.8 g/dL (ref 12.0–15.0)
Lymphocytes Relative: 26.8 % (ref 12.0–46.0)
Lymphs Abs: 1.9 K/uL (ref 0.7–4.0)
MCHC: 32.8 g/dL (ref 30.0–36.0)
MCV: 93.6 fl (ref 78.0–100.0)
Monocytes Absolute: 0.7 K/uL (ref 0.1–1.0)
Monocytes Relative: 9.8 % (ref 3.0–12.0)
Neutro Abs: 4.1 K/uL (ref 1.4–7.7)
Neutrophils Relative %: 58.9 % (ref 43.0–77.0)
Platelets: 236 K/uL (ref 150.0–400.0)
RBC: 4.51 Mil/uL (ref 3.87–5.11)
RDW: 14.5 % (ref 11.5–15.5)
WBC: 7 K/uL (ref 4.0–10.5)

## 2024-02-15 LAB — SEDIMENTATION RATE: Sed Rate: 30 mm/h (ref 0–30)

## 2024-02-15 NOTE — Progress Notes (Signed)
 Agree with the assessment and plan as outlined by Quentin Mulling, PA-C. ? ?Keron Neenan, DO, FACG ? ?

## 2024-02-15 NOTE — Patient Instructions (Addendum)
 Your provider has requested that you go to the basement level for lab work before leaving today. Press B on the elevator. The lab is located at the first door on the left as you exit the elevator.  You have been scheduled for an appointment with Dr San on 04-25-24  on 1040am. Please arrive 10 minutes early for your appointment.  Go to the ER if unable to pass gas, severe AB pain, unable to hold down food, any shortness of breath of chest pain.  You may have POST INFECTIOUS IBS OR IRRITABLE BOWEL After an infection or diverticulitis flare your intestines can spasm or be a little bit more sensitive. Try these things below:  Can do BRAT diet versus low FODMAP- see below Try trial off milk/lactose products.  Can do trial of IBGard for AB pain EVERY DAY- Take 1-2 capsules once a day for maintence or twice a day during a flare  For IBS and peppermint oil.  Peppermint oil has been proven to be better than placebo for cramping for IBS Stop if it worsens heart burn or causes flushing of your face.  Ideally enteric coated peppermint oil capsules over the counter IBGard, can take 2 a day is best but if you got the oil, you can use 0.74ml or 180 mg of pepperment oil up to 3 x a day.   if any worsening symptoms like blood in stool, weight loss, please call the office or go to the ER.    FODMAP stands for fermentable oligo-, di-, mono-saccharides and polyols (1). These are the scientific terms used to classify groups of carbs that are notorious for triggering digestive symptoms like bloating, gas and stomach pain.        Small intestinal bacterial overgrowth (SIBO) occurs when there is an abnormal increase in the overall bacterial population in the small intestine -- particularly types of bacteria not commonly found in that part of the digestive tract. Small intestinal bacterial overgrowth (SIBO) commonly results when a circumstance -- such as surgery or disease -- slows the passage of food  and waste products in the digestive tract, creating a breeding ground for bacteria.  Signs and symptoms of SIBO often include: Loss of appetite Abdominal pain Nausea Bloating An uncomfortable feeling of fullness after eating Diarrhea or constipation, depending on the type of gas produced  What foods trigger SIBO? While foods aren't the original cause of SIBO, certain foods do encourage the overgrowth of the wrong bacteria in your small intestine. If you're feeding them their favorite foods, they're going to grow more, and that will trigger more of your SIBO symptoms. By the same token, you can help reduce the overgrowth by starving the problematic bacteria of their favorite foods. This strategy has led to a number of proposed SIBO eating plans. The plans vary, and so do individual results. But in general, they tend to recommend limiting carbohydrates.  These include: Sugars and sweeteners. Fruits and starchy vegetables. Dairy products. Grains.  There is a test for this we can do called a breath test, if you are positive we will treat you with an antibiotic to see if it helps.  Your symptoms are very suspicious for this condition, as discussed, we will start you on an antibiotic to see if this helps.   Here some information about pelvic floor dysfunction. This may be contributing to some of your symptoms. We could also refer to pelvic floor physical therapy. Look into this   Pelvic Floor Dysfunction, Female Pelvic  floor dysfunction (PFD) is a condition that results when the group of muscles and connective tissues that support the organs in the pelvis (pelvic floor muscles) do not work well. These muscles and their connections form a sling that supports the colon and bladder. In women, they also support the uterus. PFD causes pelvic floor muscles to be too weak, too tight, or both. In PFD, muscle movements are not coordinated. This may cause bowel or bladder problems. It may also cause  pain. What are the causes? This condition may be caused by an injury to the pelvic area or by a weakening of pelvic muscles. This often results from pregnancy and childbirth or other types of strain. In many cases, the exact cause is not known. What increases the risk? The following factors may make you more likely to develop this condition: Having chronic bladder tissue inflammation (interstitial cystitis). Being an older person. Being overweight. History of radiation treatment for cancer in the pelvic region. Previous pelvic surgery, such as removal of the uterus (hysterectomy). What are the signs or symptoms? Symptoms of this condition vary and may include: Bladder symptoms, such as: Trouble starting urination and emptying the bladder. Frequent urinary tract infections. Leaking urine when coughing, laughing, or exercising (stress incontinence). Having to pass urine urgently or frequently. Pain when passing urine. Bowel symptoms, such as: Constipation. Urgent or frequent bowel movements. Incomplete bowel movements. Painful bowel movements. Leaking stool or gas. Unexplained genital or rectal pain. Genital or rectal muscle spasms. Low back pain. Other symptoms may include: A heavy, full, or aching feeling in the vagina. A bulge that protrudes into the vagina. Pain during or after sex. How is this diagnosed? This condition may be diagnosed based on: Your symptoms and medical history. A physical exam. During the exam, your health care provider may check your pelvic muscles for tightness, spasm, pain, or weakness. This may include a rectal exam and a pelvic exam. In some cases, you may have diagnostic tests, such as: Electrical muscle function tests. Urine flow testing. X-ray tests of bowel function. Ultrasound of the pelvic organs. How is this treated? Treatment for this condition depends on the symptoms. Treatment options include: Physical therapy. This may include Kegel  exercises to help relax or strengthen the pelvic floor muscles. Biofeedback. This type of therapy provides feedback on how tight your pelvic floor muscles are so that you can learn to control them. Internal or external massage therapy. A treatment that involves electrical stimulation of the pelvic floor muscles to help control pain (transcutaneous electrical nerve stimulation, or TENS). Sound wave therapy (ultrasound) to reduce muscle spasms. Medicines, such as: Muscle relaxants. Bladder control medicines. Surgery to reconstruct or support pelvic floor muscles may be an option if other treatments do not help. Follow these instructions at home: Activity Do your usual activities as told by your health care provider. Ask your health care provider if you should modify any activities. Do pelvic floor strengthening or relaxing exercises at home as told by your physical therapist. Lifestyle Maintain a healthy weight. Eat foods that are high in fiber, such as beans, whole grains, and fresh fruits and vegetables. Limit foods that are high in fat and processed sugars, such as fried or sweet foods. Manage stress with relaxation techniques such as yoga or meditation. General instructions If you have problems with leakage: Use absorbable pads or wear padded underwear. Wash frequently with mild soap. Keep your genital and anal area as clean and dry as possible. Ask your health care  provider if you should try a barrier cream to prevent skin irritation. Take warm baths to relieve pelvic muscle tension or spasms. Take over-the-counter and prescription medicines only as told by your health care provider. Keep all follow-up visits. How is this prevented? The cause of PFD is not always known, but there are a few things you can do to reduce the risk of developing this condition, including: Staying at a healthy weight. Getting regular exercise. Managing stress. Contact a health care provider if: Your  symptoms are not improving with home care. You have signs or symptoms of PFD that get worse at home. You develop new signs or symptoms. You have signs of a urinary tract infection, such as: Fever. Chills. Increased urinary frequency. A burning feeling when urinating. You have not had a bowel movement in 3 days (constipation). Summary Pelvic floor dysfunction results when the muscles and connective tissues in your pelvic floor do not work well. These muscles and their connections form a sling that supports your colon and bladder. In women, they also support the uterus. PFD may be caused by an injury to the pelvic area or by a weakening of pelvic muscles. PFD causes pelvic floor muscles to be too weak, too tight, or a combination of both. Symptoms may vary from person to person. In most cases, PFD can be treated with physical therapies and medicines. Surgery may be an option if other treatments do not help. This information is not intended to replace advice given to you by your health care provider. Make sure you discuss any questions you have with your health care provider. Document Revised: 09/19/2020 Document Reviewed: 09/19/2020 Elsevier Patient Education  2022 [employer].

## 2024-02-25 ENCOUNTER — Telehealth: Payer: Self-pay | Admitting: Cardiovascular Disease

## 2024-02-25 MED ORDER — METOPROLOL SUCCINATE ER 25 MG PO TB24: 25.0000 mg | ORAL_TABLET | Freq: Every day | ORAL | 0 refills | Status: DC | PRN

## 2024-02-25 NOTE — Telephone Encounter (Signed)
 RX sent in

## 2024-02-25 NOTE — Telephone Encounter (Signed)
*  STAT* If patient is at the pharmacy, call can be transferred to refill team.   1. Which medications need to be refilled? (please list name of each medication and dose if known) metoprolol succinate (TOPROL-XL) 25 MG 24 hr tablet  2. Which pharmacy/location (including street and city if local pharmacy) is medication to be sent to? [facility] - [place], [place] - [address]  3. Do they need a 30 day or 90 day supply? 90  

## 2024-02-26 DIAGNOSIS — R053 Chronic cough: Secondary | ICD-10-CM | POA: Diagnosis not present

## 2024-02-26 DIAGNOSIS — J302 Other seasonal allergic rhinitis: Secondary | ICD-10-CM | POA: Diagnosis not present

## 2024-02-26 DIAGNOSIS — K219 Gastro-esophageal reflux disease without esophagitis: Secondary | ICD-10-CM | POA: Diagnosis not present

## 2024-02-26 DIAGNOSIS — Z8719 Personal history of other diseases of the digestive system: Secondary | ICD-10-CM | POA: Diagnosis not present

## 2024-03-01 ENCOUNTER — Ambulatory Visit (HOSPITAL_COMMUNITY)
Admission: RE | Admit: 2024-03-01 | Discharge: 2024-03-01 | Disposition: A | Discharge status: Home / Self Care | Source: Ambulatory Visit | Attending: Cardiology | Admitting: Cardiology

## 2024-03-01 ENCOUNTER — Encounter (HOSPITAL_COMMUNITY): Payer: Self-pay | Admitting: Cardiology

## 2024-03-01 VITALS — BP 110/70 | HR 78 | Wt 150.4 lb

## 2024-03-01 DIAGNOSIS — I480 Paroxysmal atrial fibrillation: Secondary | ICD-10-CM | POA: Diagnosis not present

## 2024-03-01 DIAGNOSIS — I071 Rheumatic tricuspid insufficiency: Secondary | ICD-10-CM | POA: Insufficient documentation

## 2024-03-01 DIAGNOSIS — M351 Other overlap syndromes: Secondary | ICD-10-CM | POA: Diagnosis not present

## 2024-03-01 NOTE — Patient Instructions (Signed)
 There has been no changes to your medications.  You have been referred to Dr. Cindie. His office will call you to arrange your appointment.  Your physician recommends that you schedule a follow-up appointment in: 1 year ( October 2026) ** PLEASE CALL THE OFFICE IN AUGUST 2026 TO ARRANGE YOUR FOLLOW UP APPOINTMENT.**  If you have any questions or concerns before your next appointment please send us  a message through [facility] or call our office at [phone].    TO LEAVE A MESSAGE FOR THE NURSE SELECT OPTION 2, PLEASE LEAVE A MESSAGE INCLUDING: YOUR NAME DATE OF BIRTH CALL BACK NUMBER REASON FOR CALL**this is important as we prioritize the call backs  YOU WILL RECEIVE A CALL BACK THE SAME DAY AS LONG AS YOU CALL BEFORE 4:00 PM  At the [facility], you and your health needs are our priority. As part of our continuing mission to provide you with exceptional heart care, we have created designated Provider Care Teams. These Care Teams include your primary Cardiologist (physician) and Advanced Practice Providers (APPs- Physician Assistants and Nurse Practitioners) who all work together to provide you with the care you need, when you need it.   You may see any of the following providers on your designated Care Team at your next follow up: Dr Toribio Fuel Dr Ezra Shuck Dr. Ria Commander Dr. Morene Brownie Amy Lenetta, NP Caffie Shed, [place] [facility] [place], [place] Beckey Coe, NP [place] Lee, NP Ellouise Class, NP Tinnie Redman, PharmD Jaun Bash, PharmD   Please be sure to bring in all your medications bottles to every appointment.    Thank you for choosing [facility]

## 2024-03-02 NOTE — Progress Notes (Signed)
 PCP: Loreli Elsie [name], MD Cardiology: Dr. Rolan  Chief complaint: Atrial fibrillation  78 y.o. with history of hereditary hemochromatosis, paroxysmal atrial fibrillation, and mixed connective tissue disorder was referred by Dr. Loreli for evaluation of atrial fibrillation.  I have seen her husband for his CHF for years. Patient had a negative Cardiolite in 2013.  She had an echo in 4/23 with EF 60-65%, normal RV, mild-moderate TR. She sees Dr. Timmy for hemochromatosis and has had periodic phlebotomies. She was diagnosed with atrial fibrillation in 3/23 by a Zio monitor. She had noted palpitations x years.  The monitor showed a 20% burden at the time.  She was started on apixaban  and Toprol  XL.  She felt like the Toprol  XL made her fatigued, and she noted blood in her stool while on Eliquis .  She stopped the Eliquis  and had a GI evaluation in 9/24 with EGD and colonoscopy that were unremarkable. She has stayed off Eliquis  since that time.   She was seen by EP, and based on anatomy of her LA appendage, she was not thought to be a Watchman candidate.   Echo in 6/25 showed EF 65-70%, mild LVH, normal RV, moderate TR, normal IVC.   She returns for followup of atrial fibrillation.  She occasionally feels palpitations that she attributes to atrial fibrillation.  She feels terrible with atrial fibrillation, very short of breath. Generally, she does well with no significant exertional dyspnea or chest pain.  Stays active.  She had right TKR in 7/25, feels like she is still recovering from this.   ECG (personally reviewed): NSR, PACs  Labs (4/23): LDL 121 Labs (7/24): K 4, creatinine 0.68 Labs (9/25): K 4.5, creatinine 0.74, hgb 13.8  PMH: 1. Atrial fibrillation: Paroxysmal.  Diagnosed by Zio monitor in 4/23.  Symptomatic when it occurs.  - Echo (3/23): EF 60-65%, normal RV, mild-moderate TR.  - Not candidate for Watchman based on anatomy of LA appendage.  - Echo (6/25): EF 65-70%, mild LVH,  normal RV, moderate TR, normal IVC.  2. GI bleeding: Colonoscopy and EGD in 9/24 were unremarkable.  3. Hereditary hemochromatosis: C282Y heterozygote.  - Has had periodic phlebotomies 4. Mixed connective tissue disease: She does not see a rheumatologist.  5. Hypothyroidism 6. Irritable bowel syndrome 7. H/o UTIs 8. Cardiolite (2013): No ischemia/infarction.  9. Small PFO noted on pre-Watchman CT.   Family History  Problem Relation Age of Onset   Seizures Mother    Transient ischemic attack Mother    Glaucoma Mother    Colon cancer Father 17   Diabetes Father    Heart disease Father    Glaucoma Maternal Grandmother    Breast cancer Maternal Aunt    Breast cancer Maternal Aunt    Heart disease Paternal Uncle    Social History   Socioeconomic History   Marital status: Married    Spouse name: Not on file   Number of children: 3   Years of education: Not on file   Highest education level: Not on file  Occupational History   Occupation: retired  Tobacco Use   Smoking status: Never   Smokeless tobacco: Never  Vaping Use   Vaping status: Never Used  Substance and Sexual Activity   Alcohol  use: Not Currently    Comment: wine occ   Drug use: No   Sexual activity: Not Currently    Birth control/protection: Post-menopausal  Other Topics Concern   Not on file  Social History Narrative   Not on  file   Social Drivers of Health   Financial Resource Strain: Not on file  Food Insecurity: No Food Insecurity (11/30/2023)   Hunger Vital Sign    Worried About Running Out of Food in the Last Year: Never true    Ran Out of Food in the Last Year: Never true  Transportation Needs: No Transportation Needs (11/30/2023)   PRAPARE - Administrator, Civil Service (Medical): No    Lack of Transportation (Non-Medical): No  Physical Activity: Not on file  Stress: Not on file  Social Connections: Socially Integrated (11/30/2023)   Social Connection and Isolation Panel    Frequency  of Communication with Friends and Family: Three times a week    Frequency of Social Gatherings with Friends and Family: Three times a week    Attends Religious Services: 1 to 4 times per year    Active Member of Clubs or Organizations: No    Attends Banker Meetings: 1 to 4 times per year    Marital Status: Married  Catering manager Violence: Not At Risk (11/30/2023)   Humiliation, Afraid, Rape, and Kick questionnaire    Fear of Current or Ex-Partner: No    Emotionally Abused: No    Physically Abused: No    Sexually Abused: No   ROS: All systems reviewed and negative except as per HPI.   Current Outpatient Medications  Medication Sig Dispense Refill   amiloride-hydrochlorothiazide (MODURETIC) 5-50 MG tablet Take 0.5 tablets by mouth as needed.     dextroamphetamine (DEXTROSTAT) 5 MG tablet Take 5 mg by mouth daily.     Esomeprazole Magnesium (NEXIUM PO) Take 1 capsule by mouth at bedtime as needed (Heartburn).     hydroxychloroquine (PLAQUENIL) 200 MG tablet Take 200 mg by mouth daily.     IVERMECTIN PO Take 12 mg by mouth daily.     metoprolol  succinate (TOPROL -XL) 25 MG 24 hr tablet Take 1 tablet (25 mg total) by mouth daily as needed (Afib). 30 tablet 0   ondansetron  (ZOFRAN ) 4 MG tablet Take 1 tablet (4 mg total) by mouth every 6 (six) hours as needed for nausea. 20 tablet 0   potassium chloride  (KLOR-CON  M) 10 MEQ tablet Take 20 mEq by mouth daily.     Probiotic Product (PROBIOTIC DAILY PO) Take 1 capsule by mouth daily. [facility]     QUERCETIN PO Take 1 tablet by mouth daily.     thyroid  (ARMOUR) 15 MG tablet Take 15 mg by mouth See admin instructions. Only Monday-Friday     thyroid  (ARMOUR) 60 MG tablet Take 60 mg by mouth daily before breakfast.     rivaroxaban  (XARELTO ) 10 MG TABS tablet Take 1 tablet (10 mg total) by mouth daily with breakfast for 21 days. Then take one 81 mg aspirin once a day for three weeks. Then discontinue aspirin. (Patient not taking:  Reported on 03/01/2024) 21 tablet 0   Current Facility-Administered Medications  Medication Dose Route Frequency Provider Last Rate Last Admin   triamcinolone  acetonide (KENALOG ) 10 MG/ML injection 10 mg  10 mg Other Once Regal, Norman S, DPM       BP 110/70   Pulse 78   Wt 68.2 kg (150 lb 6.4 oz)   SpO2 94%   BMI 24.28 kg/m  General: NAD Neck: No JVD, no thyromegaly or thyroid  nodule.  Lungs: Clear to auscultation bilaterally with normal respiratory effort. CV: Nondisplaced PMI.  Heart regular S1/S2, no S3/S4, no murmur.  Trace ankle edema.  No carotid bruit.  Normal pedal pulses.  Abdomen: Soft, nontender, no hepatosplenomegaly, no distention.  Skin: Intact without lesions or rashes.  Neurologic: Alert and oriented x 3.  Psych: Normal affect. Extremities: No clubbing or cyanosis.  HEENT: Normal.   Assessment/Plan: 1. Atrial fibrillation: Paroxysmal.  She has short highly symptomatic atrial fibrillation runs  She is in NSR today. She has not tolerated beta blockers well.  She noted GI bleeding on apixaban  and does not want to take it long-term.  CHADSVASC score is 3 for age and gender.  She is not a Watchman candidate based on anatomy of her LA appendage.  - I think she will tolerate antiarrhythmic meds poorly given her multiple medication intolerances. She was seen by Dr. Cindie to discuss ablation.  She would be a reasonable candidate for ablation. She is willing to take Eliquis  peri-ablation but is not willing to take it long-term.  She wants to recover more from her knee surgery before having ablation but is willing to go ahead and schedule it in the future => I will refer back to EP to set up ablation date for her.  2. Hemochromatosis: Heterozygote for C282Y.  She has periodic phlebotomies.  Echo in 6/25 did not suggest significant cardiac involvement by hemochomatosis (normal EF, only mild LVH).  3. Tricuspid regurgitation: Moderate TR noted on last echo with normal RV.   - Would  repeat echo in 6/26.  4. Mixed connective tissue disease: She is taking Plaquenil.   Followup in 1 year   I spent 32 minutes reviewing records, interviewing/examining patient, and managing orders   Ezra Shuck 03/02/2024

## 2024-03-03 ENCOUNTER — Inpatient Hospital Stay

## 2024-03-03 ENCOUNTER — Inpatient Hospital Stay: Attending: Hematology & Oncology

## 2024-03-03 NOTE — Patient Instructions (Signed)

## 2024-03-04 ENCOUNTER — Encounter: Payer: Self-pay | Admitting: Gastroenterology

## 2024-03-04 MED ORDER — AMOXICILLIN-POT CLAVULANATE 875-125 MG PO TABS: 1.0000 | ORAL_TABLET | Freq: Two times a day (BID) | ORAL | 0 refills | Status: DC

## 2024-03-04 NOTE — Telephone Encounter (Signed)
 Called patient in regards to [employer]. History of diverticulitis. She has noticed that when she has a flare she will also have bladder pain, urinary frequency and incontinence. This started on 03/01/24, along with LLQ pain and cramping. She's only having one BM daily, with bright red bleeding (states this has been going on for years). She started taking Chlorine Dioxide (Clo2) & DMSO yesterday, alternating taking it every 30 minutes to 1 hour. Since then pain is starting to resolve. She would like to know if an antibiotic (not cipro ) could be called in since it is the weekend in case she needs it & if she needs to do any further testing? Last seen with Alan Coombs, PA 02/15/24.

## 2024-03-07 ENCOUNTER — Other Ambulatory Visit (HOSPITAL_COMMUNITY): Payer: Self-pay | Admitting: Cardiology

## 2024-03-07 MED ORDER — METOPROLOL SUCCINATE ER 25 MG PO TB24: 25.0000 mg | ORAL_TABLET | Freq: Every day | ORAL | 1 refills | Status: AC | PRN

## 2024-03-07 MED ORDER — RIVAROXABAN 10 MG PO TABS: 10.0000 mg | ORAL_TABLET | Freq: Every day | ORAL | 1 refills | Status: DC

## 2024-03-18 ENCOUNTER — Other Ambulatory Visit: Payer: Self-pay | Admitting: Vascular Surgery

## 2024-03-18 DIAGNOSIS — M7989 Other specified soft tissue disorders: Secondary | ICD-10-CM

## 2024-04-05 DIAGNOSIS — M5030 Other cervical disc degeneration, unspecified cervical region: Secondary | ICD-10-CM | POA: Diagnosis not present

## 2024-04-05 DIAGNOSIS — M189 Osteoarthritis of first carpometacarpal joint, unspecified: Secondary | ICD-10-CM | POA: Diagnosis not present

## 2024-04-05 DIAGNOSIS — M542 Cervicalgia: Secondary | ICD-10-CM | POA: Diagnosis not present

## 2024-04-05 DIAGNOSIS — R200 Anesthesia of skin: Secondary | ICD-10-CM | POA: Diagnosis not present

## 2024-04-09 ENCOUNTER — Encounter (HOSPITAL_COMMUNITY): Payer: Self-pay | Admitting: Cardiology

## 2024-04-12 DIAGNOSIS — G5603 Carpal tunnel syndrome, bilateral upper limbs: Secondary | ICD-10-CM | POA: Diagnosis not present

## 2024-04-15 ENCOUNTER — Encounter (HOSPITAL_COMMUNITY)

## 2024-04-15 ENCOUNTER — Encounter: Admitting: Vascular Surgery

## 2024-04-18 ENCOUNTER — Encounter: Payer: Self-pay | Admitting: Hematology & Oncology

## 2024-04-18 ENCOUNTER — Inpatient Hospital Stay

## 2024-04-18 ENCOUNTER — Other Ambulatory Visit: Payer: Self-pay

## 2024-04-18 ENCOUNTER — Inpatient Hospital Stay: Attending: Hematology & Oncology | Admitting: Hematology & Oncology

## 2024-04-18 DIAGNOSIS — I480 Paroxysmal atrial fibrillation: Secondary | ICD-10-CM | POA: Insufficient documentation

## 2024-04-18 DIAGNOSIS — Z7982 Long term (current) use of aspirin: Secondary | ICD-10-CM | POA: Insufficient documentation

## 2024-04-18 LAB — CBC WITH DIFFERENTIAL (CANCER CENTER ONLY)
Abs Immature Granulocytes: 0.02 K/uL (ref 0.00–0.07)
Basophils Absolute: 0 K/uL (ref 0.0–0.1)
Basophils Relative: 1 %
Eosinophils Absolute: 0.2 K/uL (ref 0.0–0.5)
Eosinophils Relative: 3 %
HCT: 43.4 % (ref 36.0–46.0)
Hemoglobin: 14.4 g/dL (ref 12.0–15.0)
Immature Granulocytes: 0 %
Lymphocytes Relative: 33 %
Lymphs Abs: 2 K/uL (ref 0.7–4.0)
MCH: 31 pg (ref 26.0–34.0)
MCHC: 33.2 g/dL (ref 30.0–36.0)
MCV: 93.5 fL (ref 80.0–100.0)
Monocytes Absolute: 0.6 K/uL (ref 0.1–1.0)
Monocytes Relative: 9 %
Neutro Abs: 3.3 K/uL (ref 1.7–7.7)
Neutrophils Relative %: 54 %
Platelet Count: 258 K/uL (ref 150–400)
RBC: 4.64 MIL/uL (ref 3.87–5.11)
RDW: 14.4 % (ref 11.5–15.5)
WBC Count: 6 K/uL (ref 4.0–10.5)
nRBC: 0 % (ref 0.0–0.2)

## 2024-04-18 LAB — CMP (CANCER CENTER ONLY)
ALT: 19 U/L (ref 0–44)
AST: 27 U/L (ref 15–41)
Albumin: 4.6 g/dL (ref 3.5–5.0)
Alkaline Phosphatase: 115 U/L (ref 38–126)
Anion gap: 12 (ref 5–15)
BUN: 20 mg/dL (ref 8–23)
CO2: 23 mmol/L (ref 22–32)
Calcium: 9.6 mg/dL (ref 8.9–10.3)
Chloride: 104 mmol/L (ref 98–111)
Creatinine: 0.66 mg/dL (ref 0.44–1.00)
GFR, Estimated: >60 mL/min (ref >60)
Glucose, Bld: 128 mg/dL — ABNORMAL HIGH (ref 70–99)
Potassium: 3.9 mmol/L (ref 3.5–5.1)
Sodium: 139 mmol/L (ref 135–145)
Total Bilirubin: 0.5 mg/dL (ref 0.0–1.2)
Total Protein: 6.9 g/dL (ref 6.5–8.1)

## 2024-04-18 LAB — FERRITIN: Ferritin: 47 ng/mL (ref 11–307)

## 2024-04-18 LAB — IRON AND IRON BINDING CAPACITY (CC-WL,HP ONLY)
Iron: 112 ug/dL (ref 28–170)
Saturation Ratios: 38 % — ABNORMAL HIGH (ref 10.4–31.8)
TIBC: 293 ug/dL (ref 250–450)
UIBC: 181 ug/dL

## 2024-04-18 NOTE — Progress Notes (Signed)
 Hematology and Oncology Follow Up Visit  Krisha Beegle [id number] 1945-06-03 78 y.o. 04/18/2024   Principle Diagnosis:  Hemochromatosis - heterozygous for the C282Y mutation Paroxysmal A fib   Current Therapy:        Phlebotomy as indicated to keep ferritin < 35  (changed per patient request 06/24/17) or iron saturation less than 30% Xarelto  20 mg PO daily patient stopped and is currently on aspirin and Advil BID   Interim History:  Ms. Noyes is here today for follow-up.  She feels pretty good.  She is very worried about why she has to take Xarelto .  She apparently does not take this all the time.  I know she does have a history of atrial fibrillation.  When I listen to her today, I cannot hear any abnormal or irregular heart rhythm.  Otherwise, she is doing fairly well.  When we last saw her back in August, her ferritin was 195 with iron saturation of 6%.  She has had no problems with nausea or vomiting.  She has had no change in bowel or bladder habits.  She has had no leg swelling.  She did have knee surgery I think back in July.  This may have accounted for heart of the ferritin elevation that she had.  She has had no rashes.  There is been no cough or shortness of breath.  Thankfully, she has avoided COVID.  Overall, I would say that her performance status is ECOG 1.   Wt Readings from Last 3 Encounters:  03/01/24 150 lb 6.4 oz (68.2 kg)  02/15/24 148 lb 2 oz (67.2 kg)  01/19/24 148 lb 12.8 oz (67.5 kg)     Medications:  Allergies as of 04/18/2024       Reactions   Amoxicillin  Nausea And Vomiting   Codeine Nausea Only   Keflex [cephalexin] Itching    No hives, it wasn't that bad   Eliquis  [apixaban ] Other (See Comments)   Blood in stool,joint pain dizziness   Erythromycin Nausea Only, Other (See Comments)   Iodinated Contrast Media Other (See Comments)   Pt states that she is asthmatic and cannot have. Refuses to receive contrast even with  premedication.   Meperidine    Pt stated, I don't know what this is; 04/27/17   Metoclopramide  Hcl    Metoprolol     Tired fatigue itching and hdizziness   Percocet [oxycodone -acetaminophen ]    Made pt sick    Reglan  [metoclopramide ]    Zolpidem    Other Reaction(s): Not available   Oxycodone  Nausea And Vomiting   Pt stated, I do not know if I am allergic to this; 04/27/17        Medication List        Accurate as of April 18, 2024 10:54 AM. If you have any questions, ask your nurse or doctor.          amiloride-hydrochlorothiazide 5-50 MG tablet Commonly known as: MODURETIC Take 0.5 tablets by mouth as needed.   amoxicillin -clavulanate 875-125 MG tablet Commonly known as: AUGMENTIN  Take 1 tablet by mouth 2 (two) times daily.   dextroamphetamine 5 MG tablet Commonly known as: DEXTROSTAT Take 5 mg by mouth daily.   hydroxychloroquine 200 MG tablet Commonly known as: PLAQUENIL Take 200 mg by mouth daily.   IVERMECTIN PO Take 12 mg by mouth daily.   metoprolol  succinate 25 MG 24 hr tablet Commonly known as: TOPROL -XL Take 1 tablet (25 mg total) by mouth daily as needed (Afib).  NEXIUM PO Take 1 capsule by mouth at bedtime as needed (Heartburn).   ondansetron  4 MG tablet Commonly known as: ZOFRAN  Take 1 tablet (4 mg total) by mouth every 6 (six) hours as needed for nausea.   potassium chloride  10 MEQ tablet Commonly known as: KLOR-CON  M Take 20 mEq by mouth daily.   PROBIOTIC DAILY PO Take 1 capsule by mouth daily. [facility]   QUERCETIN PO Take 1 tablet by mouth daily.   rivaroxaban  10 MG Tabs tablet Commonly known as: XARELTO  Take 1 tablet (10 mg total) by mouth daily with breakfast.   thyroid  60 MG tablet Commonly known as: ARMOUR Take 60 mg by mouth daily before breakfast.   thyroid  15 MG tablet Commonly known as: ARMOUR Take 15 mg by mouth See admin instructions. Only Monday-Friday        Allergies:  Allergies  Allergen  Reactions   Amoxicillin  Nausea And Vomiting   Codeine Nausea Only   Keflex [Cephalexin] Itching     No hives, it wasn't that bad   Eliquis  [Apixaban ] Other (See Comments)    Blood in stool,joint pain dizziness   Erythromycin Nausea Only and Other (See Comments)   Iodinated Contrast Media Other (See Comments)    Pt states that she is asthmatic and cannot have. Refuses to receive contrast even with premedication.   Meperidine     Pt stated, I don't know what this is; 04/27/17   Metoclopramide  Hcl    Metoprolol      Tired fatigue itching and hdizziness   Percocet [Oxycodone -Acetaminophen ]     Made pt sick    Reglan  [Metoclopramide ]    Zolpidem     Other Reaction(s): Not available   Oxycodone  Nausea And Vomiting    Pt stated, I do not know if I am allergic to this; 04/27/17    Past Medical History, Surgical history, Social history, and Family History were reviewed and updated.  Review of Systems: All other 10 point review of systems is negative.   Physical Exam:  Her vital signs show temperature of 98.3.  Pulse 92.  Blood pressure 106/52.  Weight is 150 pounds.  Wt Readings from Last 3 Encounters:  03/01/24 150 lb 6.4 oz (68.2 kg)  02/15/24 148 lb 2 oz (67.2 kg)  01/19/24 148 lb 12.8 oz (67.5 kg)    Physical Exam Vitals reviewed.  HENT:     Head: Normocephalic and atraumatic.  Eyes:     Pupils: Pupils are equal, round, and reactive to light.  Cardiovascular:     Rate and Rhythm: Normal rate and regular rhythm.     Heart sounds: Normal heart sounds.     Comments: Cardiac exam shows a regular rate and rhythm with a normal S1 and S2.  She has no murmurs, rubs or bruits. Pulmonary:     Effort: Pulmonary effort is normal.     Breath sounds: Normal breath sounds.  Abdominal:     General: Bowel sounds are normal.     Palpations: Abdomen is soft.     Comments: Abdominal exam is soft.  She has decent bowel sounds.  There is no fluid wave.  There is no palpable abdominal  mass.  There is no guarding or rebound tenderness.  There is no palpable liver or spleen tip.  Musculoskeletal:        General: No tenderness or deformity. Normal range of motion.     Cervical back: Normal range of motion.  Lymphadenopathy:     Cervical: No  cervical adenopathy.  Skin:    General: Skin is warm and dry.     Findings: No erythema or rash.  Neurological:     Mental Status: She is alert and oriented to person, place, and time.  Psychiatric:        Behavior: Behavior normal.        Thought Content: Thought content normal.        Judgment: Judgment normal.      Lab Results  Component Value Date   WBC 6.0 04/18/2024   HGB 14.4 04/18/2024   HCT 43.4 04/18/2024   MCV 93.5 04/18/2024   PLT 258 04/18/2024   Lab Results  Component Value Date   FERRITIN 195 01/19/2024   IRON 12 (L) 01/19/2024   TIBC 217 (L) 01/19/2024   UIBC 205 01/19/2024   IRONPCTSAT 6 (L) 01/19/2024   Lab Results  Component Value Date   RETICCTPCT 1.7 05/29/2023   RBC 4.64 04/18/2024   No results found for: KPAFRELGTCHN, LAMBDASER, KAPLAMBRATIO No results found for: IGGSERUM, IGA, IGMSERUM No results found for: STEPHANY CARLOTA BENSON MARKEL EARLA JOANNIE DOC VICK, SPEI   Chemistry      Component Value Date/Time   NA 137 02/15/2024 1157   NA 139 10/12/2023 0954   NA 143 03/11/2017 1404   NA 139 07/02/2016 1139   K 4.5 02/15/2024 1157   K 3.9 03/11/2017 1404   K 4.1 07/02/2016 1139   CL 100 02/15/2024 1157   CL 105 03/11/2017 1404   CO2 29 02/15/2024 1157   CO2 31 03/11/2017 1404   CO2 26 07/02/2016 1139   BUN 27 (H) 02/15/2024 1157   BUN 18 10/12/2023 0954   BUN 15 03/11/2017 1404   BUN 20.3 07/02/2016 1139   CREATININE 0.74 02/15/2024 1157   CREATININE 0.58 01/19/2024 1134   CREATININE 0.73 06/23/2022 1141   CREATININE 0.8 07/02/2016 1139      Component Value Date/Time   CALCIUM 10.2 02/15/2024 1157   CALCIUM 9.4 03/11/2017 1404    CALCIUM 10.0 07/02/2016 1139   ALKPHOS 93 02/15/2024 1157   ALKPHOS 69 03/11/2017 1404   ALKPHOS 87 07/02/2016 1139   AST 20 02/15/2024 1157   AST 18 01/19/2024 1134   AST 27 07/02/2016 1139   ALT 14 02/15/2024 1157   ALT 13 01/19/2024 1134   ALT 32 03/11/2017 1404   ALT 34 07/02/2016 1139   BILITOT 0.4 02/15/2024 1157   BILITOT 0.5 01/19/2024 1134   BILITOT 0.55 07/02/2016 1139      Impression and Plan: Ms. Lague is a pleasant 78 yo caucasian female with hemochromatosis, heterozygous for the C282Y mutation.    She gets therapeutic phlebotomies PRN to keep ferritin <35 and iron saturation <30%.   We will see what her iron studies look like.  I had to believe that the ferritin should be a little bit lower.  I would think that she should still have a low iron saturation..  At this point, we will see what her iron studies look like.  If she needs to come back for a phlebotomy she will.  I think we try to get her through the Holiday season and most of Winter.   Maude JONELLE Crease, MD 11/24/202510:54 AM

## 2024-04-20 ENCOUNTER — Ambulatory Visit: Payer: Self-pay | Admitting: Medical Oncology

## 2024-04-23 DIAGNOSIS — G5603 Carpal tunnel syndrome, bilateral upper limbs: Secondary | ICD-10-CM | POA: Diagnosis not present

## 2024-04-24 ENCOUNTER — Encounter: Payer: Self-pay | Admitting: Gastroenterology

## 2024-04-24 DIAGNOSIS — M542 Cervicalgia: Secondary | ICD-10-CM | POA: Diagnosis not present

## 2024-04-25 ENCOUNTER — Ambulatory Visit: Admitting: Gastroenterology

## 2024-05-02 DIAGNOSIS — M79642 Pain in left hand: Secondary | ICD-10-CM | POA: Diagnosis not present

## 2024-05-02 DIAGNOSIS — M5416 Radiculopathy, lumbar region: Secondary | ICD-10-CM | POA: Diagnosis not present

## 2024-05-02 DIAGNOSIS — M79641 Pain in right hand: Secondary | ICD-10-CM | POA: Diagnosis not present

## 2024-05-03 DIAGNOSIS — G5601 Carpal tunnel syndrome, right upper limb: Secondary | ICD-10-CM | POA: Diagnosis not present

## 2024-05-03 DIAGNOSIS — G5602 Carpal tunnel syndrome, left upper limb: Secondary | ICD-10-CM | POA: Diagnosis not present

## 2024-05-03 DIAGNOSIS — G5603 Carpal tunnel syndrome, bilateral upper limbs: Secondary | ICD-10-CM | POA: Diagnosis not present

## 2024-05-05 ENCOUNTER — Inpatient Hospital Stay: Attending: Hematology & Oncology

## 2024-05-05 NOTE — Patient Instructions (Signed)

## 2024-05-05 NOTE — Progress Notes (Signed)
 One unit therapeutic phlebotomy performed over 30 minutes via port-a-cath. Patient tolerated well. Patient did not want to stay for the recommended 30 minute post phlebotomy observation. VSS. Patient discharged ambulatory without complaints or concerns.

## 2024-05-05 NOTE — Addendum Note (Signed)
 Addended by: CLEOTILDE DOROTHYANN ORN on: 05/05/2024 02:27 PM   Modules accepted: Orders

## 2024-05-14 ENCOUNTER — Encounter (HOSPITAL_COMMUNITY): Payer: Self-pay | Admitting: Cardiology

## 2024-05-18 ENCOUNTER — Other Ambulatory Visit: Payer: Self-pay | Admitting: Podiatry

## 2024-05-18 ENCOUNTER — Other Ambulatory Visit (HOSPITAL_COMMUNITY): Payer: Self-pay

## 2024-05-18 DIAGNOSIS — I480 Paroxysmal atrial fibrillation: Secondary | ICD-10-CM

## 2024-05-25 ENCOUNTER — Encounter: Payer: Self-pay | Admitting: Cardiology

## 2024-05-25 ENCOUNTER — Ambulatory Visit: Admitting: Cardiology

## 2024-05-25 VITALS — BP 118/70 | HR 81 | Ht 66.0 in | Wt 150.0 lb

## 2024-05-25 DIAGNOSIS — R002 Palpitations: Secondary | ICD-10-CM | POA: Diagnosis not present

## 2024-05-25 DIAGNOSIS — D6869 Other thrombophilia: Secondary | ICD-10-CM

## 2024-05-25 DIAGNOSIS — I480 Paroxysmal atrial fibrillation: Secondary | ICD-10-CM

## 2024-05-25 DIAGNOSIS — Z01812 Encounter for preprocedural laboratory examination: Secondary | ICD-10-CM | POA: Diagnosis not present

## 2024-05-25 MED ORDER — RIVAROXABAN 20 MG PO TABS: 20.0000 mg | ORAL_TABLET | Freq: Every day | ORAL | 5 refills | Status: AC

## 2024-05-25 NOTE — Progress Notes (Signed)
 " Electrophysiology Office Note:   Date:  05/25/2024  ID:  Lori [name], DOB 08/23/1945, MRN [id number]  Primary Cardiologist: Debby Sor, MD (Inactive) Primary Heart Failure: Lori Shuck, MD Electrophysiologist: Lori [name] Gladis Norton, MD      History of Present Illness:   Lori [name] is a 78 y.o. female with h/o atrial fibrillation, hereditary hemochromatosis, mixed connective tissue disease seen today for routine electrophysiology followup.   Discussed the use of AI scribe software for clinical note transcription with the patient, who gave verbal consent to proceed.  History of Present Illness Lori [name] is a 78 year old female with atrial fibrillation who presents for evaluation and consideration of ablation.  She has experienced atrial fibrillation since her forties or fifties, initially presenting as palpitations. Over the last few years, her symptoms have worsened, leading to episodes of dyspnea and the need to lie down. These episodes occur intermittently, sometimes daily, and then not at all for periods. Recently, she had a symptom-free period, but her symptoms have since returned.  She is not currently on a blood thinner but has a history of allergic reactions to Eliquis , manifesting as a rash and itching, though she has tolerated Xarelto  in the past.  She mentions symptoms in her arms, which she attributes to her backbone rather than carpal tunnel syndrome, although no tests have been conducted to confirm this.  Her past medical history includes atrial fibrillation and hemochromatosis. She reports that her hemochromatosis has changed, resulting in her blood rarely thickening.  Family history reveals that her grandmother had atrial fibrillation. She denies having heart failure, high blood pressure, diabetes, previous strokes, or vascular disease.  she denies chest pain, palpitations, dyspnea, PND, orthopnea, nausea, vomiting, dizziness,  syncope, edema, weight gain, or early satiety.   Review of systems complete and found to be negative unless listed in HPI.   EP Information / Studies Reviewed:    EKG is ordered today. Personal review as below.  EKG Interpretation Date/Time:  Wednesday May 25 2024 15:25:50 EST Ventricular Rate:  81 PR Interval:  216 QRS Duration:  78 QT Interval:  392 QTC Calculation: 455 R Axis:   43  Text Interpretation: Sinus rhythm with Premature atrial complexes with 1st degree A-V block When compared with ECG of 01-Mar-2024 15:56, No significant change since last tracing Confirmed by Lori [name] ([id number]) on 05/25/2024 3:27:26 PM    Risk Assessment/Calculations:    CHA2DS2-VASc Score = 3   This indicates a 3.2% annual risk of stroke. The patient's score is based upon: CHF History: 0 HTN History: 0 Diabetes History: 0 Stroke History: 0 Vascular Disease History: 0 Age Score: 2 Gender Score: 1            Physical Exam:   VS:  BP 118/70 (BP Location: Left Arm, Patient Position: Sitting, Cuff Size: Normal)   Pulse 81   Ht 5' 6 (1.676 m)   Wt 150 lb (68 kg)   SpO2 97%   BMI 24.21 kg/m    Wt Readings from Last 3 Encounters:  05/25/24 150 lb (68 kg)  04/18/24 150 lb (68 kg)  03/01/24 150 lb 6.4 oz (68.2 kg)     GEN: Well nourished, well developed in no acute distress NECK: No JVD; No carotid bruits CARDIAC: Regular rate and rhythm with occasional ectopy, no murmurs, rubs, gallops RESPIRATORY:  Clear to auscultation without rales, wheezing or rhonchi  ABDOMEN: Soft, non-tender, non-distended EXTREMITIES:  No edema; No deformity   ASSESSMENT  AND PLAN:    1.  Paroxysmal atrial fibrillation: She has episodes of atrial fibrillation making her feel quite poorly.  On Plaquenil for mixed connective tissue disease and thus antiarrhythmics would be difficult.  She would prefer ablation.  Risks and benefits have been discussed.  She understands the risks and has agreed to the  procedure.  Lori [name] start Xarelto  prior to ablation by 3 weeks.  Risk, benefits, and alternatives to EP study and radiofrequency/pulse field ablation for afib were also discussed in detail today. These risks include but are not limited to stroke, bleeding, vascular damage, tamponade, perforation, damage to the esophagus, lungs, and other structures, pulmonary vein stenosis, worsening renal function, and death. The patient understands these risk and wishes to proceed.  We Lori [name] therefore proceed with catheter ablation at the next available time.  Carto, ICE, anesthesia are requested for the procedure.  This patient Lori [name] NOT require CT prior to ablation  2.  Secondary hypercoagulable state: Nyzir Dubois start Xarelto  prior to ablation.  She can stop Xarelto  3 months after ablation.  3.  Hemochromatosis: Has periodic phlebotomies per hematology.    Follow up with EP Team as usual post procedure  Signed, Lori [name] Gladis Norton, MD  "

## 2024-05-25 NOTE — Patient Instructions (Signed)
 Medication Instructions:   Your physician has recommended you make the following change in your medication:   ** Begin Xarelto  20mg  - 3 weeks prior to your ablation.  You may start this on June 23, 2024  *If you need a refill on your cardiac medications before your next appointment, please call your pharmacy*  Testing/Procedures: Ablation Your physician has recommended that you have an ablation. Catheter ablation is a medical procedure used to treat some cardiac arrhythmias (irregular heartbeats). During catheter ablation, a long, thin, flexible tube is put into a blood vessel in your groin (upper thigh), or neck. This tube is called an ablation catheter. It is then guided to your heart through the blood vessel. Radio frequency waves destroy small areas of heart tissue where abnormal heartbeats may cause an arrhythmia to start.   You are scheduled for Atrial Fibrillation Ablation on Friday, February 20 with Dr. Dr. Inocencio. Please arrive at the Main Entrance A at [facility]: [address] [place], [place] [address] at 7:30 AM   What To Expect:  Labs: you will need to have lab work drawn within 30 days of your procedure. Please go to any [facility] location to have these drawn - no appointment is needed. You will receive procedure instructions either through MyChart or in the mail 4-6 week prior to your procedure.  After your procedure we recommend no driving for 4 days, no lifting over 5 lbs for 7 days, and no work or strenuous activity for 7 days.  Please contact our office at [phone] if you have any questions.    Follow-Up: We will contact you to schedule your post-procedure appointments.

## 2024-06-02 ENCOUNTER — Inpatient Hospital Stay: Attending: Hematology & Oncology

## 2024-06-02 NOTE — Patient Instructions (Signed)

## 2024-06-09 ENCOUNTER — Ambulatory Visit: Admitting: Gastroenterology

## 2024-06-10 ENCOUNTER — Encounter: Payer: Self-pay | Admitting: Cardiology

## 2024-06-15 ENCOUNTER — Encounter: Payer: Self-pay | Admitting: Gastroenterology

## 2024-06-15 ENCOUNTER — Ambulatory Visit: Admitting: Gastroenterology

## 2024-06-15 VITALS — BP 126/78 | HR 83 | Ht 66.0 in | Wt 154.8 lb

## 2024-06-15 DIAGNOSIS — Z800 Family history of malignant neoplasm of digestive organs: Secondary | ICD-10-CM | POA: Diagnosis not present

## 2024-06-15 DIAGNOSIS — Z860100 Personal history of colon polyps, unspecified: Secondary | ICD-10-CM

## 2024-06-15 DIAGNOSIS — K582 Mixed irritable bowel syndrome: Secondary | ICD-10-CM | POA: Diagnosis not present

## 2024-06-15 DIAGNOSIS — K5790 Diverticulosis of intestine, part unspecified, without perforation or abscess without bleeding: Secondary | ICD-10-CM | POA: Diagnosis not present

## 2024-06-15 DIAGNOSIS — Z860101 Personal history of adenomatous and serrated colon polyps: Secondary | ICD-10-CM | POA: Diagnosis not present

## 2024-06-15 DIAGNOSIS — R140 Abdominal distension (gaseous): Secondary | ICD-10-CM

## 2024-06-15 NOTE — Patient Instructions (Signed)
 _______________________________________________________  If your blood pressure at your visit was 140/90 or greater, please contact your primary care physician to follow up on this.  _______________________________________________________  If you are age 78 or older, your body mass index should be between 23-30. Your Body mass index is 24.99 kg/m. If this is out of the aforementioned range listed, please consider follow up with your Primary Care Provider.  If you are age 5 or younger, your body mass index should be between 19-25. Your Body mass index is 24.99 kg/m. If this is out of the aformentioned range listed, please consider follow up with your Primary Care Provider.   ________________________________________________________  The [facility] providers would like to encourage you to use MYCHART to communicate with providers for non-urgent requests or questions.  Due to long hold times on the telephone, sending your provider a message by [facility] may be a faster and more efficient way to get a response.  Please allow 48 business hours for a response.  Please remember that this is for non-urgent requests.  _______________________________________________________  [facility] is using a team-based approach to care.  Your team is made up of your doctor and two to three APPS. Our APPS (Nurse Practitioners and Physician Assistants) work with your physician to ensure care continuity for you. They are fully qualified to address your health concerns and develop a treatment plan. They communicate directly with your gastroenterologist to care for you. Seeing the Advanced Practice Practitioners on your physician's team can help you by facilitating care more promptly, often allowing for earlier appointments, access to diagnostic testing, procedures, and other specialty referrals.   It was a pleasure to see you today!  Vito Cirigliano, D.O.

## 2024-06-15 NOTE — Progress Notes (Unsigned)
 "  Chief Complaint:    IBS, diverticulitis follow-up  GI History: Lori [name] is a 79 y.o. female with a history of atrial fibrillation and atrial flutter (on ASA 325 mg daily), HTN, mixed connective tissue disorder, osteoarthritis, hemochromatosis with periodic phlebotomy, ccy, history of GI bleed, who follows in the GI clinic for the following:   Long-standing hx of alternating bowel habits for 10+ years. Can have loose, non-bloody stools, but also formed at times. Not related to PO intake. Can have nausea, but no emesis. No abdominal pain. Weight stable. Will use OTC herbal stool softener at times. Will use Magnesium supplement daily.  Feels that she has lifelong autoimmune diseases that contribute in part to her GI symptoms.   Previously followed with Dr. Rollin at [facility].   - 10/09/2020: Colonoscopy: 3 polyps measuring 3-5 mm in descending, ascending, cecum removed with cold snare (adenomas), sigmoid diverticulosis, diffuse severe melanosis coli throughout.  Residual ascending colon polyp (adenoma) removed in piecemeal fashion and current resection appears to be complete.  It was difficult to resect because it was on top of a fold.  Recommended repeat in 3 years. - 01/08/2023: Colonoscopy: 2 sessile polyps in the ascending colon measuring 3-4 mm removed with cold snare (adenomas), sigmoid diverticulosis, diffuse area of severe melanosis coli throughout the colon (biopsied: Benign melanosis coli).  Repeat colonoscopy not recommended for surveillance. - 01/08/2023: EGD: Normal esophagus empirically dilated with 17 mm Savary dilator without resistance, normal stomach and duodenum.  Esophageal biopsies normal.   Previously, followed with Dr. Timm.  She reports having being diagnosed with autoimmune hepatitis in the past.  Limited records available for review but liver biopsy x 2 does not show AIH. - 10/12/2008: Liver biopsy: Mild grade 1 chronic hepatitis with mild portal fibrosis (stage I)  without steatosis.  Increased iron stores.  Overall findings are nonspecific. - 12/04/2014: Liver biopsy: mild portal chronic inflammation and focal iron deposition without any findings to suggest autoimmune hepatitis. - 11/2014: EGD:  Endoscopy report not available for review but pathology report reviewed notable for normal duodenal biopsies, benign gastric fundic gland polyp, and normal esophageal biopsies - 12/2016: Colonoscopy: Diverticulosis, hemorrhoids, diminutive adenomatous polyp   Follows in the Hematology Clinic for her hemochromatosis with periodic phlebotomy.   Fhx notable for father with colon cancer, diagnosed >age 34.   HPI:     Patient is a 79 y.o. female presenting to the Gastroenterology Clinic for follow-up.  Was initially seen by me on 01/12/2024 to establish care.  Previously followed with Dr. Rollin and Dr. Timm as outlined above.  Had ordered SIBO breath testing.  Obtained prior endoscopic records from Dr. Rollin which were reviewed and outlined above.  Was otherwise not due for repeat endoscopic procedures.  Patient called in on 01/15/2024 with concern for diverticulitis. - ESR 54 - WBC 13.8 with elevated neutrophils - Was started on Cipro /Flagyl  for presumed diverticulitis but had GI upset  - 01/16/2024: CT A/P: Acute uncomplicated sigmoid diverticulitis  Repeat labs on 8/26: - WBC 14.3 with normal H/H - Ferritin 195, felt elevated due to diverticulitis and no phlebotomy recommended by Hematology - Iron 12, TIBC 217, sat 6%  Had follow-up with Alan Coombs on 02/15/2024.  ESR normalized on repeat on 02/15/2024 along with normalization of WBC (7.0) and normal CMP.  Called in again on 03/04/2024 with concern for recurrent diverticulitis after bladder infection.  Since she had intolerance to Cipro  and only has nausea with penicillins, so Rx was sent  in for Augmentin  875 mg twice daily.  Ordered for repeat labs (CBC and ESR), but those were not completed.  Today, she  states she otherwise feels fine from a GI standpoint.  Has chronic GI issues as outlined above, but no change from baseline.  She is otherwise without any acute issues or concerns today.   Most recent labs on 11/24 with iron sat 38% with otherwise normal CBC and Dr. Timmy recommended phlebotomy.  Scheduled for A-fib ablation next month.  Review of systems:     No chest pain, no SOB, no fevers, no urinary sx   Past Medical History:  Diagnosis Date   Autoimmune hepatitis (HCC)    Cancer (HCC) 06/2017   SQUAMOS CELL REMOVED FROM FOREHEAD   Chronic fatigue    Complication of anesthesia    Partial Hysterectomy-Severe pain that radiated to esophogaus, and 12 more episode    GERD (gastroesophageal reflux disease)    Hemochromatosis    History of kidney stones    Hormone disorder    Hypothyroidism    IBS (irritable bowel syndrome)    Lupus    Mixed connective tissue disease    Osteoporosis 07/2017   T score -2.7 stable from prior DEXA   Palpitations    Rheumatoid arthritis (HCC)     Patient's surgical history, family medical history, social history, medications and allergies were all reviewed in Epic    Current Outpatient Medications  Medication Sig Dispense Refill   amiloride-hydrochlorothiazide (MODURETIC) 5-50 MG tablet Take 0.5 tablets by mouth as needed.     dextroamphetamine (DEXTROSTAT) 5 MG tablet Take 5 mg by mouth daily.     Esomeprazole Magnesium (NEXIUM PO) Take 1 capsule by mouth at bedtime as needed (Heartburn).     hydroxychloroquine (PLAQUENIL) 200 MG tablet Take 200 mg by mouth daily.     IVERMECTIN PO Take 12 mg by mouth daily.     metoprolol  succinate (TOPROL -XL) 25 MG 24 hr tablet Take 1 tablet (25 mg total) by mouth daily as needed (Afib). 30 tablet 1   ondansetron  (ZOFRAN ) 4 MG tablet Take 1 tablet (4 mg total) by mouth every 6 (six) hours as needed for nausea. 20 tablet 0   potassium chloride  (KLOR-CON  M) 10 MEQ tablet Take 20 mEq by mouth daily.      Probiotic Product (PROBIOTIC DAILY PO) Take 1 capsule by mouth daily. [facility]     QUERCETIN PO Take 1 tablet by mouth daily.     rivaroxaban  (XARELTO ) 20 MG TABS tablet Take 1 tablet (20 mg total) by mouth daily with supper. (Patient taking differently: Take 20 mg by mouth daily with supper. Doesn't take daily) 30 tablet 5   thyroid  (ARMOUR) 15 MG tablet Take 15 mg by mouth See admin instructions. Only Monday-Friday     thyroid  (ARMOUR) 60 MG tablet Take 60 mg by mouth daily before breakfast.     albuterol (VENTOLIN HFA) 108 (90 Base) MCG/ACT inhaler Inhale into the lungs. (Patient not taking: Reported on 06/15/2024)     Current Facility-Administered Medications  Medication Dose Route Frequency Provider Last Rate Last Admin   triamcinolone  acetonide (KENALOG ) 10 MG/ML injection 10 mg  10 mg Other Once Magdalen Pasco RAMAN, [place]        Physical Exam:     BP 126/78   Pulse 83   Ht 5' 6 (1.676 m)   Wt 154 lb 12.8 oz (70.2 kg)   BMI 24.99 kg/m   GENERAL:  Pleasant *** in  NAD PSYCH: : Cooperative, normal affect EENT:  conjunctiva pink, mucous membranes moist, neck supple without masses CARDIAC:  RRR, ***murmur heard, no peripheral edema PULM: Normal respiratory effort, lungs CTA bilaterally, no wheezing ABDOMEN:  Nondistended, soft, nontender. No obvious masses, no hepatomegaly,  normal bowel sounds SKIN:  turgor, no lesions seen Musculoskeletal:  Normal muscle tone, normal strength NEURO: Alert and oriented x 3, no focal neurologic deficits   IMPRESSION and PLAN:    1)      RTC prn          Lempi Edwin V Yarima Penman ,DO, FACG 06/15/2024, 11:06 AM  "

## 2024-06-16 ENCOUNTER — Telehealth: Payer: Self-pay

## 2024-06-16 NOTE — Telephone Encounter (Signed)
-----   Message from Nurse Aldona SAUNDERS, RN sent at 05/27/2024 11:56 AM EST ----- Regarding: 07/15/2024 Afib Camnitz Important: list procedure date as first item in subject line, followed by procedure type (e.g., 02/05/24 AFib ablation)  Precert:  MD: Camnitz Type of ablation: A-fib Diagnosis: Afib CPT code: A-fib (06343) Ablation scheduled (date/time): 2/20 930  Procedure:  Added to calendar? Yes Orders entered? No, >30 days before procedure Letter complete? No, >30 days before procedure Scheduled with cath lab? Yes Any medications to hold? Routine Labs ordered (CBC, BMET, PT/INR if on warfarin): Yes Mapping system: CARTO (lab 4 or 6) CARTO/OPAL rep notified? Yes Cardiac CT needed? No Dye allergy? Yes Pre-meds ordered and instructions given? No, n/a Letter method: MyChart H&P: 12/31 Device: No  Follow-up:  Cassie/Angel, please schedule Routine.  Covering RN - please send this message to [employer], EP scheduler, EP Scheduling pool, EP [employer], and CT scheduler (Brittany Lynch/Stephanie Mogg), if indicated.

## 2024-06-17 ENCOUNTER — Telehealth (HOSPITAL_COMMUNITY): Payer: Self-pay

## 2024-06-17 ENCOUNTER — Encounter (HOSPITAL_COMMUNITY): Payer: Self-pay

## 2024-06-17 NOTE — Telephone Encounter (Signed)
 Attempted to reach patient to discuss upcoming procedure, no answer. Left VM for patient to return call.

## 2024-07-15 ENCOUNTER — Encounter (HOSPITAL_COMMUNITY): Payer: Self-pay

## 2024-07-15 ENCOUNTER — Ambulatory Visit (HOSPITAL_COMMUNITY): Admit: 2024-07-15 | Admitting: Cardiology

## 2024-07-15 SURGERY — ATRIAL FIBRILLATION ABLATION
Anesthesia: General

## 2024-07-19 ENCOUNTER — Inpatient Hospital Stay

## 2024-07-19 ENCOUNTER — Inpatient Hospital Stay: Admitting: Medical Oncology

## 2024-07-22 ENCOUNTER — Ambulatory Visit: Admitting: Diagnostic Neuroimaging
